# Patient Record
Sex: Female | Born: 1945
Health system: Southern US, Community
[De-identification: ages and names within clinical notes are randomized; demographics above are authoritative.]

## PROBLEM LIST (undated history)

## (undated) DIAGNOSIS — Z8719 Personal history of other diseases of the digestive system: Secondary | ICD-10-CM

## (undated) DIAGNOSIS — J45909 Unspecified asthma, uncomplicated: Secondary | ICD-10-CM

## (undated) DIAGNOSIS — R42 Dizziness and giddiness: Secondary | ICD-10-CM

## (undated) DIAGNOSIS — K649 Unspecified hemorrhoids: Secondary | ICD-10-CM

## (undated) DIAGNOSIS — G3184 Mild cognitive impairment, so stated: Secondary | ICD-10-CM

## (undated) DIAGNOSIS — I10 Essential (primary) hypertension: Secondary | ICD-10-CM

## (undated) DIAGNOSIS — H409 Unspecified glaucoma: Secondary | ICD-10-CM

## (undated) DIAGNOSIS — M1990 Unspecified osteoarthritis, unspecified site: Secondary | ICD-10-CM

## (undated) DIAGNOSIS — R1084 Generalized abdominal pain: Secondary | ICD-10-CM

## (undated) DIAGNOSIS — G500 Trigeminal neuralgia: Secondary | ICD-10-CM

## (undated) DIAGNOSIS — F0280 Dementia in other diseases classified elsewhere without behavioral disturbance: Secondary | ICD-10-CM

## (undated) DIAGNOSIS — Z972 Presence of dental prosthetic device (complete) (partial): Secondary | ICD-10-CM

## (undated) DIAGNOSIS — E782 Mixed hyperlipidemia: Secondary | ICD-10-CM

## (undated) DIAGNOSIS — N950 Postmenopausal bleeding: Secondary | ICD-10-CM

## (undated) DIAGNOSIS — K5730 Diverticulosis of large intestine without perforation or abscess without bleeding: Secondary | ICD-10-CM

## (undated) DIAGNOSIS — G459 Transient cerebral ischemic attack, unspecified: Secondary | ICD-10-CM

## (undated) DIAGNOSIS — I639 Cerebral infarction, unspecified: Secondary | ICD-10-CM

## (undated) DIAGNOSIS — R9389 Abnormal findings on diagnostic imaging of other specified body structures: Secondary | ICD-10-CM

## (undated) DIAGNOSIS — E785 Hyperlipidemia, unspecified: Secondary | ICD-10-CM

## (undated) DIAGNOSIS — R351 Nocturia: Secondary | ICD-10-CM

## (undated) DIAGNOSIS — Z7901 Long term (current) use of anticoagulants: Secondary | ICD-10-CM

## (undated) DIAGNOSIS — K5909 Other constipation: Secondary | ICD-10-CM

## (undated) HISTORY — PX: REPLACEMENT TOTAL KNEE: SUR1224

## (undated) HISTORY — DX: Unspecified asthma, uncomplicated: J45.909

## (undated) HISTORY — PX: CHOLECYSTECTOMY: SHX55

## (undated) HISTORY — DX: Hyperlipidemia, unspecified: E78.5

## (undated) HISTORY — PX: UMBILICAL HERNIA REPAIR: SHX196

## (undated) HISTORY — PX: HERNIA REPAIR: SHX51

## (undated) HISTORY — DX: Trigeminal neuralgia: G50.0

## (undated) HISTORY — PX: SHOULDER SURGERY: SHX246

## (undated) HISTORY — PX: TOTAL KNEE ARTHROPLASTY: SHX125

## (undated) HISTORY — PX: REVERSE SHOULDER ARTHROPLASTY: SHX5054

## (undated) HISTORY — PX: CATARACT EXTRACTION W/ INTRAOCULAR LENS IMPLANT: SHX1309

## (undated) HISTORY — DX: Personal history of other diseases of the digestive system: Z87.19

---

## 1998-10-11 HISTORY — PX: CHOLECYSTECTOMY: SHX55

## 1998-10-11 HISTORY — PX: CHOLECYSTECTOMY, LAPAROSCOPIC: SHX56

## 2003-12-01 ENCOUNTER — Other Ambulatory Visit: Payer: Self-pay

## 2004-03-18 ENCOUNTER — Other Ambulatory Visit: Payer: Self-pay

## 2004-08-20 ENCOUNTER — Other Ambulatory Visit: Payer: Self-pay

## 2004-08-20 ENCOUNTER — Emergency Department: Payer: Self-pay | Admitting: Emergency Medicine

## 2005-02-11 ENCOUNTER — Ambulatory Visit: Payer: Self-pay | Admitting: Family Medicine

## 2005-03-11 DIAGNOSIS — I69998 Other sequelae following unspecified cerebrovascular disease: Secondary | ICD-10-CM | POA: Insufficient documentation

## 2005-03-12 ENCOUNTER — Other Ambulatory Visit: Payer: Self-pay

## 2005-03-12 ENCOUNTER — Inpatient Hospital Stay: Payer: Self-pay | Admitting: Internal Medicine

## 2005-03-16 DIAGNOSIS — I639 Cerebral infarction, unspecified: Secondary | ICD-10-CM

## 2005-03-16 DIAGNOSIS — I6930 Unspecified sequelae of cerebral infarction: Secondary | ICD-10-CM

## 2005-03-16 HISTORY — DX: Unspecified sequelae of cerebral infarction: I69.30

## 2005-03-16 HISTORY — DX: Cerebral infarction, unspecified: I63.9

## 2005-03-19 ENCOUNTER — Other Ambulatory Visit: Payer: Self-pay

## 2005-03-19 ENCOUNTER — Emergency Department: Payer: Self-pay | Admitting: Emergency Medicine

## 2005-04-06 ENCOUNTER — Emergency Department: Payer: Self-pay | Admitting: Emergency Medicine

## 2005-04-06 ENCOUNTER — Other Ambulatory Visit: Payer: Self-pay

## 2005-05-01 ENCOUNTER — Other Ambulatory Visit: Payer: Self-pay

## 2005-05-01 ENCOUNTER — Emergency Department: Payer: Self-pay | Admitting: Emergency Medicine

## 2005-06-21 ENCOUNTER — Ambulatory Visit: Payer: Self-pay | Admitting: Neurology

## 2005-10-02 ENCOUNTER — Other Ambulatory Visit: Payer: Self-pay

## 2005-10-02 ENCOUNTER — Emergency Department: Payer: Self-pay | Admitting: Unknown Physician Specialty

## 2005-10-27 ENCOUNTER — Emergency Department: Payer: Self-pay | Admitting: Emergency Medicine

## 2005-10-27 ENCOUNTER — Other Ambulatory Visit: Payer: Self-pay

## 2005-11-16 ENCOUNTER — Ambulatory Visit: Payer: Self-pay | Admitting: Family Medicine

## 2005-11-25 ENCOUNTER — Emergency Department: Payer: Self-pay | Admitting: Emergency Medicine

## 2005-11-25 ENCOUNTER — Other Ambulatory Visit: Payer: Self-pay

## 2005-12-20 ENCOUNTER — Emergency Department: Payer: Self-pay | Admitting: General Practice

## 2006-02-05 ENCOUNTER — Emergency Department: Payer: Self-pay | Admitting: Emergency Medicine

## 2006-04-23 ENCOUNTER — Other Ambulatory Visit: Payer: Self-pay

## 2006-04-23 ENCOUNTER — Emergency Department: Payer: Self-pay | Admitting: Emergency Medicine

## 2006-04-25 ENCOUNTER — Ambulatory Visit: Payer: Self-pay | Admitting: Emergency Medicine

## 2006-06-28 ENCOUNTER — Emergency Department: Payer: Self-pay | Admitting: Emergency Medicine

## 2006-11-18 ENCOUNTER — Ambulatory Visit: Payer: Self-pay | Admitting: Family Medicine

## 2007-07-28 ENCOUNTER — Emergency Department: Payer: Self-pay | Admitting: Emergency Medicine

## 2007-07-28 ENCOUNTER — Other Ambulatory Visit: Payer: Self-pay

## 2007-12-12 ENCOUNTER — Ambulatory Visit: Payer: Self-pay | Admitting: Family Medicine

## 2008-04-28 ENCOUNTER — Emergency Department: Payer: Self-pay | Admitting: Emergency Medicine

## 2008-09-23 ENCOUNTER — Emergency Department: Payer: Self-pay | Admitting: Emergency Medicine

## 2008-12-13 ENCOUNTER — Ambulatory Visit: Payer: Self-pay | Admitting: Family Medicine

## 2009-03-07 ENCOUNTER — Emergency Department: Payer: Self-pay | Admitting: Emergency Medicine

## 2009-06-25 ENCOUNTER — Ambulatory Visit: Payer: Self-pay | Admitting: Gastroenterology

## 2009-06-25 LAB — HM COLONOSCOPY

## 2009-07-20 ENCOUNTER — Emergency Department: Payer: Self-pay | Admitting: Emergency Medicine

## 2009-08-19 ENCOUNTER — Emergency Department: Payer: Self-pay | Admitting: Emergency Medicine

## 2009-12-15 ENCOUNTER — Ambulatory Visit: Payer: Self-pay | Admitting: Family Medicine

## 2010-11-12 ENCOUNTER — Emergency Department: Payer: Self-pay | Admitting: Emergency Medicine

## 2010-12-17 ENCOUNTER — Ambulatory Visit: Payer: Self-pay | Admitting: Family Medicine

## 2011-11-08 ENCOUNTER — Emergency Department: Payer: Self-pay | Admitting: Emergency Medicine

## 2011-11-08 LAB — COMPREHENSIVE METABOLIC PANEL
Anion Gap: 9 (ref 7–16)
BUN: 13 mg/dL (ref 7–18)
Bilirubin,Total: 0.3 mg/dL (ref 0.2–1.0)
Creatinine: 0.86 mg/dL (ref 0.60–1.30)
EGFR (African American): >60
EGFR (Non-African Amer.): >60
Glucose: 86 mg/dL (ref 65–99)
Osmolality: 283 (ref 275–301)
Potassium: 3.8 mmol/L (ref 3.5–5.1)
SGOT(AST): 25 U/L (ref 15–37)
Sodium: 142 mmol/L (ref 136–145)
Total Protein: 7.2 g/dL (ref 6.4–8.2)

## 2011-11-08 LAB — CBC
MCH: 34.3 pg — ABNORMAL HIGH (ref 26.0–34.0)
MCHC: 33.5 g/dL (ref 32.0–36.0)
MCV: 103 fL — ABNORMAL HIGH (ref 80–100)
Platelet: 156 10*3/uL (ref 150–440)
RBC: 3.33 10*6/uL — ABNORMAL LOW (ref 3.80–5.20)
RDW: 12.8 % (ref 11.5–14.5)

## 2011-11-08 LAB — CK TOTAL AND CKMB (NOT AT ARMC)
CK, Total: 153 U/L (ref 21–215)
CK-MB: 0.7 ng/mL (ref 0.5–3.6)

## 2011-11-17 ENCOUNTER — Ambulatory Visit: Payer: Self-pay | Admitting: Family Medicine

## 2011-12-20 ENCOUNTER — Ambulatory Visit: Payer: Self-pay | Admitting: Family Medicine

## 2012-01-02 ENCOUNTER — Emergency Department: Payer: Self-pay | Admitting: Emergency Medicine

## 2012-10-27 ENCOUNTER — Emergency Department: Payer: Self-pay | Admitting: Internal Medicine

## 2013-01-18 ENCOUNTER — Ambulatory Visit: Payer: Self-pay | Admitting: Family Medicine

## 2013-06-25 ENCOUNTER — Emergency Department: Payer: Self-pay | Admitting: Emergency Medicine

## 2013-06-26 LAB — CBC
HCT: 36 % (ref 35.0–47.0)
HGB: 12.2 g/dL (ref 12.0–16.0)
MCHC: 33.9 g/dL (ref 32.0–36.0)
MCV: 101 fL — ABNORMAL HIGH (ref 80–100)
RBC: 3.56 10*6/uL — ABNORMAL LOW (ref 3.80–5.20)

## 2013-06-26 LAB — COMPREHENSIVE METABOLIC PANEL
Albumin: 3.6 g/dL (ref 3.4–5.0)
Alkaline Phosphatase: 136 U/L (ref 50–136)
BUN: 12 mg/dL (ref 7–18)
Chloride: 111 mmol/L — ABNORMAL HIGH (ref 98–107)
Creatinine: 0.86 mg/dL (ref 0.60–1.30)
EGFR (African American): >60
EGFR (Non-African Amer.): >60
Osmolality: 281 (ref 275–301)
SGOT(AST): 41 U/L — ABNORMAL HIGH (ref 15–37)
SGPT (ALT): 36 U/L (ref 12–78)
Sodium: 141 mmol/L (ref 136–145)

## 2013-06-26 LAB — LIPASE, BLOOD: Lipase: 100 U/L (ref 73–393)

## 2013-09-26 ENCOUNTER — Emergency Department: Payer: Self-pay | Admitting: Emergency Medicine

## 2013-09-26 LAB — CBC WITH DIFFERENTIAL/PLATELET
Basophil #: 0 10*3/uL (ref 0.0–0.1)
Eosinophil #: 0.1 10*3/uL (ref 0.0–0.7)
HCT: 36.3 % (ref 35.0–47.0)
HGB: 12.6 g/dL (ref 12.0–16.0)
Lymphocyte #: 1.8 10*3/uL (ref 1.0–3.6)
Lymphocyte %: 28.5 %
Monocyte #: 0.8 x10 3/mm (ref 0.2–0.9)
Monocyte %: 12.1 %
Neutrophil #: 3.6 10*3/uL (ref 1.4–6.5)
Neutrophil %: 57.2 %
Platelet: 181 10*3/uL (ref 150–440)
RDW: 13.4 % (ref 11.5–14.5)
WBC: 6.3 10*3/uL (ref 3.6–11.0)

## 2013-09-26 LAB — BASIC METABOLIC PANEL
Anion Gap: 5 — ABNORMAL LOW (ref 7–16)
BUN: 12 mg/dL (ref 7–18)
Chloride: 108 mmol/L — ABNORMAL HIGH (ref 98–107)
Creatinine: 0.76 mg/dL (ref 0.60–1.30)
EGFR (African American): >60
Glucose: 92 mg/dL (ref 65–99)
Osmolality: 279 (ref 275–301)

## 2013-09-26 LAB — URINALYSIS, COMPLETE
Bacteria: NONE SEEN
Bilirubin,UR: NEGATIVE
Nitrite: NEGATIVE
Ph: 6 (ref 4.5–8.0)
Protein: 30
RBC,UR: 1 /HPF (ref 0–5)
Squamous Epithelial: 3
WBC UR: 1 /HPF (ref 0–5)

## 2013-09-26 LAB — TROPONIN I: Troponin-I: <0.02 ng/mL

## 2013-10-05 ENCOUNTER — Emergency Department: Payer: Self-pay | Admitting: Emergency Medicine

## 2013-10-05 LAB — RAPID INFLUENZA A&B ANTIGENS

## 2014-01-21 ENCOUNTER — Ambulatory Visit: Payer: Self-pay | Admitting: Family Medicine

## 2015-01-23 ENCOUNTER — Ambulatory Visit
Admit: 2015-01-23 | Disposition: A | Discharge status: Home / Self Care | Payer: Self-pay | Attending: Family Medicine | Admitting: Family Medicine

## 2015-01-31 ENCOUNTER — Emergency Department: Admit: 2015-01-31 | Disposition: A | Discharge status: Home / Self Care | Payer: Self-pay | Admitting: Student

## 2015-01-31 LAB — URINALYSIS, COMPLETE
Bacteria: NONE SEEN
Bilirubin,UR: NEGATIVE
Blood: NEGATIVE
GLUCOSE, UR: NEGATIVE mg/dL (ref 0–75)
KETONE: NEGATIVE
LEUKOCYTE ESTERASE: NEGATIVE
Nitrite: NEGATIVE
PROTEIN: NEGATIVE
Ph: 5 (ref 4.5–8.0)
RBC, UR: NONE SEEN /HPF (ref 0–5)
SPECIFIC GRAVITY: 1.012 (ref 1.003–1.030)

## 2015-01-31 LAB — COMPREHENSIVE METABOLIC PANEL
ALBUMIN: 3.7 g/dL
ALK PHOS: 98 U/L
Anion Gap: 4 — ABNORMAL LOW (ref 7–16)
BILIRUBIN TOTAL: 0.3 mg/dL
BUN: 13 mg/dL
CALCIUM: 8.4 mg/dL — AB
CREATININE: 0.92 mg/dL
Chloride: 107 mmol/L
Co2: 27 mmol/L
EGFR (African American): >60
EGFR (Non-African Amer.): >60
Glucose: 98 mg/dL
Potassium: 3.7 mmol/L
SGOT(AST): 28 U/L
SGPT (ALT): 18 U/L
Sodium: 138 mmol/L
Total Protein: 7 g/dL

## 2015-01-31 LAB — CBC
HCT: 38.1 % (ref 35.0–47.0)
HGB: 12.5 g/dL (ref 12.0–16.0)
MCH: 33.5 pg (ref 26.0–34.0)
MCHC: 32.9 g/dL (ref 32.0–36.0)
MCV: 102 fL — ABNORMAL HIGH (ref 80–100)
Platelet: 161 10*3/uL (ref 150–440)
RBC: 3.73 10*6/uL — AB (ref 3.80–5.20)
RDW: 12.9 % (ref 11.5–14.5)
WBC: 6.8 10*3/uL (ref 3.6–11.0)

## 2015-01-31 LAB — APTT: Activated PTT: 26.6 secs (ref 23.6–35.9)

## 2015-01-31 LAB — CK TOTAL AND CKMB (NOT AT ARMC)
CK, Total: 103 U/L
CK-MB: 1.1 ng/mL

## 2015-01-31 LAB — TROPONIN I: Troponin-I: <0.03 ng/mL

## 2015-01-31 LAB — PROTIME-INR
INR: 1
Prothrombin Time: 13 secs

## 2015-04-21 ENCOUNTER — Other Ambulatory Visit: Payer: Self-pay | Admitting: Family Medicine

## 2015-05-10 DIAGNOSIS — B349 Viral infection, unspecified: Secondary | ICD-10-CM | POA: Diagnosis not present

## 2015-05-10 DIAGNOSIS — Z79899 Other long term (current) drug therapy: Secondary | ICD-10-CM | POA: Insufficient documentation

## 2015-05-10 DIAGNOSIS — R110 Nausea: Secondary | ICD-10-CM | POA: Diagnosis present

## 2015-05-10 DIAGNOSIS — R202 Paresthesia of skin: Secondary | ICD-10-CM | POA: Diagnosis not present

## 2015-05-10 DIAGNOSIS — I10 Essential (primary) hypertension: Secondary | ICD-10-CM | POA: Insufficient documentation

## 2015-05-11 ENCOUNTER — Other Ambulatory Visit: Payer: Self-pay

## 2015-05-11 ENCOUNTER — Emergency Department: Payer: Federal, State, Local not specified - PPO

## 2015-05-11 ENCOUNTER — Emergency Department
Admission: EM | Admit: 2015-05-11 | Discharge: 2015-05-11 | Disposition: A | Discharge status: Home / Self Care | Payer: Federal, State, Local not specified - PPO | Attending: Emergency Medicine | Admitting: Emergency Medicine

## 2015-05-11 ENCOUNTER — Encounter: Payer: Self-pay | Admitting: Emergency Medicine

## 2015-05-11 DIAGNOSIS — B349 Viral infection, unspecified: Secondary | ICD-10-CM

## 2015-05-11 HISTORY — DX: Dizziness and giddiness: R42

## 2015-05-11 HISTORY — DX: Essential (primary) hypertension: I10

## 2015-05-11 HISTORY — DX: Cerebral infarction, unspecified: I63.9

## 2015-05-11 HISTORY — DX: Transient cerebral ischemic attack, unspecified: G45.9

## 2015-05-11 LAB — CBC
HCT: 36.1 % (ref 35.0–47.0)
HEMOGLOBIN: 12.2 g/dL (ref 12.0–16.0)
MCH: 34.4 pg — ABNORMAL HIGH (ref 26.0–34.0)
MCHC: 33.6 g/dL (ref 32.0–36.0)
MCV: 102.2 fL — ABNORMAL HIGH (ref 80.0–100.0)
Platelets: 187 10*3/uL (ref 150–440)
RBC: 3.54 MIL/uL — ABNORMAL LOW (ref 3.80–5.20)
RDW: 12.8 % (ref 11.5–14.5)
WBC: 7.2 10*3/uL (ref 3.6–11.0)

## 2015-05-11 LAB — BASIC METABOLIC PANEL
ANION GAP: 9 (ref 5–15)
BUN: 20 mg/dL (ref 6–20)
CALCIUM: 8.6 mg/dL — AB (ref 8.9–10.3)
CO2: 25 mmol/L (ref 22–32)
Chloride: 105 mmol/L (ref 101–111)
Creatinine, Ser: 0.91 mg/dL (ref 0.44–1.00)
GFR calc non Af Amer: >60 mL/min (ref >60)
Glucose, Bld: 105 mg/dL — ABNORMAL HIGH (ref 65–99)
Potassium: 3.7 mmol/L (ref 3.5–5.1)
Sodium: 139 mmol/L (ref 135–145)

## 2015-05-11 LAB — TROPONIN I

## 2015-05-11 MED ORDER — ONDANSETRON 8 MG PO TBDP
8.0000 mg | ORAL_TABLET | Freq: Once | ORAL | Status: AC
  Administered 2015-05-11: 8 mg via ORAL
  Filled 2015-05-11: qty 1

## 2015-05-11 MED ORDER — RANITIDINE HCL 150 MG PO CAPS: 150.0000 mg | ORAL_CAPSULE | Freq: Two times a day (BID) | ORAL | Status: DC

## 2015-05-11 MED ORDER — FAMOTIDINE 20 MG PO TABS
40.0000 mg | ORAL_TABLET | Freq: Once | ORAL | Status: AC
  Administered 2015-05-11: 40 mg via ORAL
  Filled 2015-05-11: qty 2

## 2015-05-11 MED ORDER — ONDANSETRON 8 MG PO TBDP: 8.0000 mg | ORAL_TABLET | Freq: Three times a day (TID) | ORAL | Status: DC | PRN

## 2015-05-11 MED ORDER — GI COCKTAIL ~~LOC~~
30.0000 mL | ORAL | Status: AC
  Administered 2015-05-11: 30 mL via ORAL
  Filled 2015-05-11: qty 30

## 2015-05-11 NOTE — Discharge Instructions (Signed)

## 2015-05-11 NOTE — ED Provider Notes (Signed)
[facility] Emergency Department Provider Note  ____________________________________________  Time seen: 2:10 AM  I have reviewed the triage vital signs and the nursing notes.   HISTORY  Chief Complaint Nausea and Tingling    HPI Amy Ray is a 69 y.o. female who reports nasal and frontal sinus congestion for 2 days along with some sore throat and nonproductive coughing. Today she felt tired and upsets her stomach with nausea all day. No chest pain or shortness of breath. No fevers chills dizziness lightheadedness or syncope. No dysuria frequency urgency back pain. She is currently feeling better.  She does report some tingling of the left side of her body but states this is a chronic issue that is not changed and she is not concerned about     Past Medical History  Diagnosis Date  . Hypertension   . Arthritis   . Stroke   . TIA (transient ischemic attack)   . Vertigo     There are no active problems to display for this patient.   Past Surgical History  Procedure Laterality Date  . Replacement total knee    . Hernia repair    . Cholecystectomy      Current Outpatient Rx  Name  Route  Sig  Dispense  Refill  . nortriptyline (PAMELOR) 10 MG capsule      1-2 Capsule, Oral, at bedtime   60 capsule   12     DX: 350.1   . ondansetron (ZOFRAN ODT) 8 MG disintegrating tablet   Oral   Take 1 tablet (8 mg total) by mouth every 8 (eight) hours as needed for nausea or vomiting.   20 tablet   0   . ranitidine (ZANTAC) 150 MG capsule   Oral   Take 1 capsule (150 mg total) by mouth 2 (two) times daily.   28 capsule   0     Allergies Review of patient's allergies indicates no known allergies.  History reviewed. No pertinent family history.  Social History History  Substance Use Topics  . Smoking status: Never Smoker   . Smokeless tobacco: Not on file  . Alcohol Use: No    Review of Systems  Constitutional: No fever or chills.  No weight changes Eyes:No blurry vision or double vision.  ENT: Positive sore throat. Cardiovascular: No chest pain. Respiratory: No dyspnea, positive nonproductive cough. Gastrointestinal: Negative for abdominal pain, vomiting and diarrhea.  No BRBPR or melena. Genitourinary: Negative for dysuria, urinary retention, bloody urine, or difficulty urinating. Musculoskeletal: Negative for back pain. No joint swelling or pain. Skin: Negative for rash. Neurological: Negative for headaches, focal weakness or numbness. Psychiatric:No anxiety or depression.   Endocrine:No hot/cold intolerance, changes in energy, or sleep difficulty.  10-point ROS otherwise negative.  ____________________________________________   PHYSICAL EXAM:  VITAL SIGNS: ED Triage Vitals  Enc Vitals Group     BP 05/11/15 0024 153/75 mmHg     Pulse Rate 05/11/15 0024 88     Resp 05/11/15 0024 20     Temp 05/11/15 0024 98.2 F (36.8 C)     Temp Source 05/11/15 0024 Oral     SpO2 05/11/15 0024 99 %     Weight 05/11/15 0024 205 lb (92.987 kg)     Height 05/11/15 0024  (1.549 m)     Head Cir --      Peak Flow --      Pain Score --      Pain Loc --  Pain Edu? --      Excl. in GC? --      Constitutional: Alert and oriented. Well appearing and in no distress. Eyes: No scleral icterus. No conjunctival pallor. PERRL. EOMI ENT   Head: Normocephalic and atraumatic. No pain on percussion of sinuses   Nose: No congestion/rhinnorhea. No septal hematoma   Mouth/Throat: MMM, mild pharyngeal erythema. No peritonsillar mass. No uvula shift.   Neck: No stridor. No SubQ emphysema. No meningismus. Hematological/Lymphatic/Immunilogical: No cervical lymphadenopathy. Cardiovascular: RRR. Normal and symmetric distal pulses are present in all extremities. No murmurs, rubs, or gallops. Respiratory: Normal respiratory effort without tachypnea nor retractions. Breath sounds are clear and equal bilaterally. No  wheezes/rales/rhonchi. Gastrointestinal: Soft and nontender. No distention. There is no CVA tenderness.  No rebound, rigidity, or guarding. Genitourinary: deferred Musculoskeletal: Nontender with normal range of motion in all extremities. No joint effusions.  No lower extremity tenderness.  No edema. Neurologic:   Normal speech and language.  CN 2-10 normal. Motor grossly intact. No pronator drift.  Normal gait. No gross focal neurologic deficits are appreciated.  Skin:  Skin is warm, dry and intact. No rash noted.  No petechiae, purpura, or bullae. Psychiatric: Mood and affect are normal. Speech and behavior are normal. Patient exhibits appropriate insight and judgment.  ____________________________________________    LABS (pertinent positives/negatives) (all labs ordered are listed, but only abnormal results are displayed) Labs Reviewed  BASIC METABOLIC PANEL - Abnormal; Notable for the following:    Glucose, Bld 105 (*)    Calcium 8.6 (*)    All other components within normal limits  CBC - Abnormal; Notable for the following:    RBC 3.54 (*)    MCV 102.2 (*)    MCH 34.4 (*)    All other components within normal limits  TROPONIN I   ____________________________________________   EKG  Interpreted by me  Date: 05/11/2015  Rate: 88  Rhythm: normal sinus rhythm  QRS Axis: normal  Intervals: normal  ST/T Wave abnormalities: normal  Conduction Disutrbances: none  Narrative Interpretation: unremarkable      ____________________________________________    RADIOLOGY  Chest x-ray unremarkable CT head unremarkable  ____________________________________________   PROCEDURES  ____________________________________________   INITIAL IMPRESSION / ASSESSMENT AND PLAN / ED COURSE  Pertinent labs & imaging results that were available during my care of the patient were reviewed by me and considered in my medical decision making (see chart for details).  Patient  presents with acute symptoms suggestive of a viral syndrome. The tingling appears to be chronic issue and is not indicative of any acute neurologic process such as intracranial hemorrhage or stroke. She is very well appearing in good spirits good energy. Calm comfortable. We'll manage her symptoms with antacids and antiemetics and have her follow-up with primary care this week.  ____________________________________________   FINAL CLINICAL IMPRESSION(S) / ED DIAGNOSES  Final diagnoses:  Viral syndrome      Sharman Cheek, MD 05/11/15 [phone]

## 2015-05-11 NOTE — ED Notes (Signed)
Pt presents to ER alert and in NAD. Pt states she is having nausea, left side tingling, belching. Pt moving all extremities normally, clear speech.

## 2015-05-20 ENCOUNTER — Ambulatory Visit (INDEPENDENT_AMBULATORY_CARE_PROVIDER_SITE_OTHER): Payer: Federal, State, Local not specified - PPO | Admitting: Family Medicine

## 2015-05-20 ENCOUNTER — Encounter: Payer: Self-pay | Admitting: Family Medicine

## 2015-05-20 ENCOUNTER — Encounter: Payer: Self-pay | Admitting: *Deleted

## 2015-05-20 VITALS — BP 120/84 | HR 83 | Temp 98.6°F | Resp 16 | Ht 61.0 in | Wt 215.0 lb

## 2015-05-20 DIAGNOSIS — R5383 Other fatigue: Secondary | ICD-10-CM | POA: Insufficient documentation

## 2015-05-20 DIAGNOSIS — K5790 Diverticulosis of intestine, part unspecified, without perforation or abscess without bleeding: Secondary | ICD-10-CM | POA: Insufficient documentation

## 2015-05-20 DIAGNOSIS — G500 Trigeminal neuralgia: Secondary | ICD-10-CM

## 2015-05-20 DIAGNOSIS — Z8719 Personal history of other diseases of the digestive system: Secondary | ICD-10-CM | POA: Insufficient documentation

## 2015-05-20 DIAGNOSIS — G4700 Insomnia, unspecified: Secondary | ICD-10-CM | POA: Insufficient documentation

## 2015-05-20 DIAGNOSIS — M545 Low back pain, unspecified: Secondary | ICD-10-CM | POA: Insufficient documentation

## 2015-05-20 DIAGNOSIS — Z8673 Personal history of transient ischemic attack (TIA), and cerebral infarction without residual deficits: Secondary | ICD-10-CM | POA: Diagnosis not present

## 2015-05-20 DIAGNOSIS — H6980 Other specified disorders of Eustachian tube, unspecified ear: Secondary | ICD-10-CM | POA: Insufficient documentation

## 2015-05-20 DIAGNOSIS — I10 Essential (primary) hypertension: Secondary | ICD-10-CM

## 2015-05-20 DIAGNOSIS — R1013 Epigastric pain: Secondary | ICD-10-CM | POA: Insufficient documentation

## 2015-05-20 DIAGNOSIS — R251 Tremor, unspecified: Secondary | ICD-10-CM | POA: Insufficient documentation

## 2015-05-20 DIAGNOSIS — E785 Hyperlipidemia, unspecified: Secondary | ICD-10-CM | POA: Diagnosis not present

## 2015-05-20 DIAGNOSIS — A048 Other specified bacterial intestinal infections: Secondary | ICD-10-CM | POA: Insufficient documentation

## 2015-05-20 DIAGNOSIS — R51 Headache: Secondary | ICD-10-CM

## 2015-05-20 DIAGNOSIS — R519 Headache, unspecified: Secondary | ICD-10-CM | POA: Insufficient documentation

## 2015-05-20 HISTORY — DX: Personal history of other diseases of the digestive system: Z87.19

## 2015-05-20 NOTE — Progress Notes (Signed)
Patient: Amy Ray Female    DOB: 1946-07-29   69 y.o.   MRN: [id number] Visit Date: 05/20/2015  Today's Provider: Mila Merry, MD   Chief Complaint  Patient presents with  . Follow-up    6 months  . Hypertension  . Hyperlipidemia  . Insomnia   Subjective:    HPI    Follow-up for insomnia from 12/05/2013; no changes were made.  She went to ER on 05-11-15 when she developed dizziness and tingling in the left side of her body. She had extensive negative workup including negative head CT. She has had similar symptoms off and on since at least 2006 when she had thorough neuro-up by Dr. Chestine Spore and sx thought to be TIA, RIND, or anxiety. She remains on statin, aggressive BP management, and Aggrenox which she is tolerating well.    Hypertension, follow-up:  BP Readings from Last 3 Encounters:  05/20/15 120/84  12/05/14 140/90  05/11/15 156/72    She was last seen for hypertension 6 months ago.  BP at that visit was none. Management changes since that visit include none .She reports good compliance with treatment. She is not having side effects. none  She is exercising. She is adherent to low salt diet.   Outside blood pressures are 120/80. She is experiencing palpitations.  Patient denies none.   Cardiovascular risk factors include none.  Use of agents associated with hypertension: none.   ------------------------------------------------------------------------    Lipid/Cholesterol, Follow-up:   Last seen for this 6 months ago.  Management changes since that visit include none.    She reports good compliance with treatment. She is not having side effects. none  Wt Readings from Last 3 Encounters:  05/20/15 215 lb (97.523 kg)  12/05/14 221 lb (100.245 kg)  05/11/15 205 lb (92.987 kg)    ------------------------------------------------------------------------  Trigeminal neuralgia She continues on carbamazepine and nortriptylline which she reports are  well tolerated and working well. Episodes occur only a few times a week and are mild.    Allergies  Allergen Reactions  . Quinapril-Hydrochlorothiazide Other (See Comments)    Other reaction(s): Headache   Previous Medications   ALPRAZOLAM (XANAX) 0.5 MG TABLET    Take 0.5-1 tablets by mouth. Up to 2 times daily as needed for sleep   ASPIRIN 81 MG TABLET    Take 1 tablet by mouth daily.   CARBAMAZEPINE (TEGRETOL) 200 MG TABLET    Take 1 tablet by mouth 2 (two) times daily.   DILTIAZEM (DILACOR XR) 180 MG 24 HR CAPSULE    Take 2 capsules by mouth daily.   DIPYRIDAMOLE-ASPIRIN (AGGRENOX) 200-25 MG PER 12 HR CAPSULE    Take 1 capsule by mouth every 12 (twelve) hours.   FLUTICASONE (FLONASE) 50 MCG/ACT NASAL SPRAY    Place 2 sprays into both nostrils at bedtime.   IRON, IRON,    Take 1 tablet by mouth daily.   NORTRIPTYLINE (PAMELOR) 10 MG CAPSULE    1-2 Capsule, Oral, at bedtime   ONDANSETRON (ZOFRAN ODT) 8 MG DISINTEGRATING TABLET    Take 1 tablet (8 mg total) by mouth every 8 (eight) hours as needed for nausea or vomiting.   RANITIDINE (ZANTAC) 150 MG CAPSULE    Take 1 capsule (150 mg total) by mouth 2 (two) times daily.   SIMVASTATIN (ZOCOR) 20 MG TABLET    Take 1 tablet by mouth daily.    Review of Systems  Constitutional: Negative for chills and fatigue.  Respiratory:  Negative for chest tightness, shortness of breath and wheezing.   Cardiovascular: Positive for palpitations. Negative for chest pain and leg swelling.  Neurological: Positive for dizziness, light-headedness and headaches.    History  Substance Use Topics  . Smoking status: Never Smoker   . Smokeless tobacco: Not on file  . Alcohol Use: No   Objective:   BP 120/84 mmHg  Pulse 83  Temp(Src) 98.6 F (37 C) (Oral)  Resp 16  Ht 5\' 1"  (1.549 m)  Wt 215 lb (97.523 kg)  BMI 40.64 kg/m2  SpO2 99%  Physical Exam  General Appearance:    Alert, cooperative, no distress, obese  Eyes:    PERRL, conjunctiva/corneas  clear, EOM's intact       Lungs:     Clear to auscultation bilaterally, respirations unlabored  Heart:    Regular rate and rhythm  Neurologic:   Awake, alert, oriented x 3. No apparent focal neurological           defect.   Carotids:   No bruit      Results for orders placed or performed during the hospital encounter of 05/11/15  Basic metabolic panel  Result Value Ref Range   Sodium 139 135 - 145 mmol/L   Potassium 3.7 3.5 - 5.1 mmol/L   Chloride 105 101 - 111 mmol/L   CO2 25 22 - 32 mmol/L   Glucose, Bld 105 (H) 65 - 99 mg/dL   BUN 20 6 - 20 mg/dL   Creatinine, Ser 1.61 0.44 - 1.00 mg/dL   Calcium 8.6 (L) 8.9 - 10.3 mg/dL   GFR calc non Af Amer >60 >60 mL/min   GFR calc Af Amer >60 >60 mL/min   Anion gap 9 5 - 15  CBC  Result Value Ref Range   WBC 7.2 3.6 - 11.0 K/uL   RBC 3.54 (L) 3.80 - 5.20 MIL/uL   Hemoglobin 12.2 12.0 - 16.0 g/dL   HCT 09.6 04.5 - 40.9 %   MCV 102.2 (H) 80.0 - 100.0 fL   MCH 34.4 (H) 26.0 - 34.0 pg   MCHC 33.6 32.0 - 36.0 g/dL   RDW 81.1 91.4 - 78.2 %   Platelets 187 150 - 440 K/uL  Troponin I  Result Value Ref Range   Troponin I <0.03 <0.031 ng/mL       Assessment & Plan:     1. Trigeminal neuralgia well controlled Continue current medications.    2. Essential (primary) hypertension well controlled Recent EKG and labs at [facility] were normal.   3. Hyperlipidemia She is tolerating simvastatin well with no adverse effects.    4. History of CVA (cerebrovascular accident) Continue Aggrenox and aggressive risk factor modfication.   Return in about 6 months (around 11/20/2015).      Mila Merry, MD  [facility]

## 2015-06-05 ENCOUNTER — Ambulatory Visit: Payer: Self-pay | Admitting: Family Medicine

## 2015-06-24 ENCOUNTER — Other Ambulatory Visit: Payer: Self-pay | Admitting: Family Medicine

## 2015-06-24 NOTE — Telephone Encounter (Signed)
I think your pt.

## 2015-06-24 NOTE — Telephone Encounter (Signed)
Please call in alprazolam.  

## 2015-06-25 ENCOUNTER — Other Ambulatory Visit: Payer: Self-pay | Admitting: *Deleted

## 2015-06-25 MED ORDER — SIMVASTATIN 20 MG PO TABS: 20.0000 mg | ORAL_TABLET | Freq: Every day | ORAL | Status: DC

## 2015-06-25 MED ORDER — CARBAMAZEPINE 200 MG PO TABS: 200.0000 mg | ORAL_TABLET | Freq: Two times a day (BID) | ORAL | Status: DC

## 2015-06-25 NOTE — Telephone Encounter (Addendum)
Refill request for Simvastatin 20 mg qd Last filled by MD on- 07/02/2014 #90 x3  Refill request for Carbamazepine 200 mg x1 qd Last filled by MD on- 07/02/2014 #180 x3  Last Appt: 05/20/2015 Next Appt: none Please advise refill?

## 2015-06-25 NOTE — Telephone Encounter (Signed)
Rx called in to pharmacy. 

## 2015-07-01 ENCOUNTER — Encounter: Payer: Self-pay | Admitting: Family Medicine

## 2015-07-01 ENCOUNTER — Ambulatory Visit (INDEPENDENT_AMBULATORY_CARE_PROVIDER_SITE_OTHER): Payer: Federal, State, Local not specified - PPO | Admitting: Family Medicine

## 2015-07-01 VITALS — BP 130/80 | HR 84 | Temp 98.4°F | Resp 16 | Wt 214.0 lb

## 2015-07-01 DIAGNOSIS — R194 Change in bowel habit: Secondary | ICD-10-CM

## 2015-07-01 NOTE — Progress Notes (Signed)
Patient: Amy Ray Female    DOB: June 03, 1946   69 y.o.   MRN: [id number] Visit Date: 07/01/2015  Today's Provider: Mila Merry, MD   No chief complaint on file.  Subjective:    HPI  Increased bowel movements since Saturday 06/28/2015, up to 6-7 times a day. Usually as soon as she eats she has to go straight to the restroom.States that she usually stays a little constipated and has to takes OTC Maalox and suppository. Has felt a little more fatigued lately. Stool is pasty, not watery at all. No blood in stool. No pain or cramping, but a little bit nauseated. No change in medications, diet or eating habits.   Allergies  Allergen Reactions  . Quinapril-Hydrochlorothiazide Other (See Comments)    Other reaction(s): Headache   Previous Medications   ALPRAZOLAM (XANAX) 0.5 MG TABLET    take 1/2 to 1 tablet by mouth UP TO 2 TIMES A DAY AS NEEDED FOR SLEEP.   ASPIRIN 81 MG TABLET    Take 1 tablet by mouth daily.   CARBAMAZEPINE (TEGRETOL) 200 MG TABLET    Take 1 tablet (200 mg total) by mouth 2 (two) times daily.   DILTIAZEM (DILACOR XR) 180 MG 24 HR CAPSULE    Take 2 capsules by mouth daily.   DIPYRIDAMOLE-ASPIRIN (AGGRENOX) 200-25 MG PER 12 HR CAPSULE    Take 1 capsule by mouth every 12 (twelve) hours.   FLUTICASONE (FLONASE) 50 MCG/ACT NASAL SPRAY    Place 2 sprays into both nostrils at bedtime.   IRON, IRON,    Take 1 tablet by mouth daily.   NORTRIPTYLINE (PAMELOR) 10 MG CAPSULE    1-2 Capsule, Oral, at bedtime   ONDANSETRON (ZOFRAN ODT) 8 MG DISINTEGRATING TABLET    Take 1 tablet (8 mg total) by mouth every 8 (eight) hours as needed for nausea or vomiting.   RANITIDINE (ZANTAC) 150 MG CAPSULE    Take 1 capsule (150 mg total) by mouth 2 (two) times daily.   SIMVASTATIN (ZOCOR) 20 MG TABLET    Take 1 tablet (20 mg total) by mouth daily.    Review of Systems  Cardiovascular: Negative for chest pain and palpitations.  Gastrointestinal: Positive for nausea and abdominal  distention. Negative for blood in stool and anal bleeding.       Increase in bowel movements/no diarrhea   Neurological: Negative for dizziness, light-headedness and headaches.    Social History  Substance Use Topics  . Smoking status: Never Smoker   . Smokeless tobacco: Not on file  . Alcohol Use: No   Objective:   BP 130/80 mmHg  Pulse 84  Temp(Src) 98.4 F (36.9 C) (Oral)  Resp 16  Wt 214 lb (97.07 kg)  SpO2 100%  Physical Exam  General Appearance:    Alert, cooperative, no distress, obese  Eyes:    PERRL, conjunctiva/corneas clear, EOM's intact       Lungs:     Clear to auscultation bilaterally, respirations unlabored  Heart:    Regular rate and rhythm  Abdomen:   bowel sounds present and normal in all 4 quadrants. No CVA or abdominal  tenderness        Assessment & Plan:     1. Change in bowel habits Send for copy of most recent colonoscopy, She was referred to Dr. Niel Hummer in 2010 but do not have a record of this test result.   - CBC - Comprehensive metabolic panel - T4 AND TSH  She may take 1 or 2 OTC Imodium.       Mila Merry, MD  [facility]

## 2015-07-01 NOTE — Patient Instructions (Signed)
You may take 1 or 2 Imodium today to reduce number of bowel movements.

## 2015-07-02 ENCOUNTER — Telehealth: Payer: Self-pay | Admitting: Family Medicine

## 2015-07-02 LAB — COMPREHENSIVE METABOLIC PANEL
ALK PHOS: 124 IU/L — AB (ref 39–117)
ALT: 23 IU/L (ref 0–32)
AST: 24 IU/L (ref 0–40)
Albumin/Globulin Ratio: 1.4 (ref 1.1–2.5)
Albumin: 4.2 g/dL (ref 3.6–4.8)
BILIRUBIN TOTAL: 0.3 mg/dL (ref 0.0–1.2)
BUN/Creatinine Ratio: 12 (ref 11–26)
BUN: 11 mg/dL (ref 8–27)
CO2: 24 mmol/L (ref 18–29)
CREATININE: 0.93 mg/dL (ref 0.57–1.00)
Calcium: 9 mg/dL (ref 8.7–10.3)
Chloride: 104 mmol/L (ref 97–108)
GFR calc Af Amer: 73 mL/min/{1.73_m2} (ref >59)
GFR calc non Af Amer: 63 mL/min/{1.73_m2} (ref >59)
GLOBULIN, TOTAL: 3.1 g/dL (ref 1.5–4.5)
Glucose: 83 mg/dL (ref 65–99)
Potassium: 4.3 mmol/L (ref 3.5–5.2)
SODIUM: 144 mmol/L (ref 134–144)
Total Protein: 7.3 g/dL (ref 6.0–8.5)

## 2015-07-02 LAB — CBC
Hematocrit: 39.7 % (ref 34.0–46.6)
Hemoglobin: 13.5 g/dL (ref 11.1–15.9)
MCH: 34.1 pg — AB (ref 26.6–33.0)
MCHC: 34 g/dL (ref 31.5–35.7)
MCV: 100 fL — ABNORMAL HIGH (ref 79–97)
Platelets: 211 10*3/uL (ref 150–379)
RBC: 3.96 x10E6/uL (ref 3.77–5.28)
RDW: 13.1 % (ref 12.3–15.4)
WBC: 6.3 10*3/uL (ref 3.4–10.8)

## 2015-07-02 LAB — T4 AND TSH
T4, Total: 5.7 ug/dL (ref 4.5–12.0)
TSH: 1.76 u[IU]/mL (ref 0.450–4.500)

## 2015-07-02 NOTE — Telephone Encounter (Signed)
Pt is requesting lab results.  CB#[phone]/[id number]

## 2015-07-02 NOTE — Telephone Encounter (Signed)
Please advise results? 

## 2015-07-03 ENCOUNTER — Telehealth: Payer: Self-pay | Admitting: Family Medicine

## 2015-07-03 DIAGNOSIS — R197 Diarrhea, unspecified: Secondary | ICD-10-CM

## 2015-07-03 NOTE — Telephone Encounter (Signed)
Patient notified of results. Expressed understanding. Patient stated she has started having diarrhea and some stomach cramps.

## 2015-07-03 NOTE — Telephone Encounter (Signed)
Stool labs ordered. Patient was notified.

## 2015-07-03 NOTE — Telephone Encounter (Signed)
Need to order stool tests Need stool WBC, c. Diff toxins, ova&parasites, and stool cultures.  Once she collects stool sample we can start her on antibiotic for most common bacterial infections of bowel.

## 2015-07-03 NOTE — Telephone Encounter (Signed)
Pt stated that she needs to speak with a nurse today. She is still having diarrhea. Thanks TNP

## 2015-07-07 ENCOUNTER — Other Ambulatory Visit: Payer: Self-pay | Admitting: Family Medicine

## 2015-07-11 ENCOUNTER — Telehealth: Payer: Self-pay | Admitting: Family Medicine

## 2015-07-11 LAB — STOOL CULTURE: E coli, Shiga toxin Assay: NEGATIVE

## 2015-07-11 LAB — FECAL LACTOFERRIN, QUANT: Lactoferrin, Fecal, Quant.: 1.4 ug/mL(g) (ref 0.00–7.24)

## 2015-07-11 LAB — CLOSTRIDIUM DIFFICILE EIA: C difficile Toxins A+B, EIA: NEGATIVE

## 2015-07-11 LAB — OVA AND PARASITE EXAMINATION

## 2015-07-11 NOTE — Telephone Encounter (Signed)
Please review. Thanks!  

## 2015-07-11 NOTE — Telephone Encounter (Signed)
Pt stated that she hasn't gotten the results from her stool sample that was sent to [facility] on 07/07/15. Thanks TNP

## 2015-07-11 NOTE — Telephone Encounter (Signed)
[employer] and the Research scientist (medical) says that the sample is still being processed. They are waiting on the stool culture result which usually take 4 days. Tried calling patient to give her an update. No answer. Left message for patient to call back.

## 2015-07-11 NOTE — Telephone Encounter (Signed)
Advised patient as below.  

## 2015-07-21 ENCOUNTER — Emergency Department
Admission: EM | Admit: 2015-07-21 | Discharge: 2015-07-22 | Disposition: A | Discharge status: Home / Self Care | Payer: Federal, State, Local not specified - PPO | Attending: Student | Admitting: Student

## 2015-07-21 DIAGNOSIS — I10 Essential (primary) hypertension: Secondary | ICD-10-CM | POA: Diagnosis not present

## 2015-07-21 DIAGNOSIS — Z79899 Other long term (current) drug therapy: Secondary | ICD-10-CM | POA: Insufficient documentation

## 2015-07-21 DIAGNOSIS — R197 Diarrhea, unspecified: Secondary | ICD-10-CM | POA: Diagnosis not present

## 2015-07-21 DIAGNOSIS — N390 Urinary tract infection, site not specified: Secondary | ICD-10-CM | POA: Diagnosis not present

## 2015-07-21 DIAGNOSIS — Z7982 Long term (current) use of aspirin: Secondary | ICD-10-CM | POA: Diagnosis not present

## 2015-07-21 DIAGNOSIS — R1032 Left lower quadrant pain: Secondary | ICD-10-CM

## 2015-07-21 DIAGNOSIS — R195 Other fecal abnormalities: Secondary | ICD-10-CM

## 2015-07-21 LAB — CBC WITH DIFFERENTIAL/PLATELET
Basophils Absolute: 0 10*3/uL (ref 0–0.1)
Basophils Relative: 0 %
EOS ABS: 0.1 10*3/uL (ref 0–0.7)
EOS PCT: 2 %
HCT: 35 % (ref 35.0–47.0)
Hemoglobin: 11.8 g/dL — ABNORMAL LOW (ref 12.0–16.0)
Lymphocytes Relative: 28 %
Lymphs Abs: 2.2 10*3/uL (ref 1.0–3.6)
MCH: 33.9 pg (ref 26.0–34.0)
MCHC: 33.6 g/dL (ref 32.0–36.0)
MCV: 100.8 fL — ABNORMAL HIGH (ref 80.0–100.0)
Monocytes Absolute: 1 10*3/uL — ABNORMAL HIGH (ref 0.2–0.9)
Monocytes Relative: 13 %
NEUTROS PCT: 57 %
Neutro Abs: 4.4 10*3/uL (ref 1.4–6.5)
PLATELETS: 176 10*3/uL (ref 150–440)
RBC: 3.47 MIL/uL — ABNORMAL LOW (ref 3.80–5.20)
RDW: 12.3 % (ref 11.5–14.5)
WBC: 7.7 10*3/uL (ref 3.6–11.0)

## 2015-07-21 LAB — COMPREHENSIVE METABOLIC PANEL
ALBUMIN: 3.8 g/dL (ref 3.5–5.0)
ALT: 21 U/L (ref 14–54)
AST: 25 U/L (ref 15–41)
Alkaline Phosphatase: 110 U/L (ref 38–126)
Anion gap: 6 (ref 5–15)
BUN: 29 mg/dL — ABNORMAL HIGH (ref 6–20)
CALCIUM: 8.7 mg/dL — AB (ref 8.9–10.3)
CHLORIDE: 108 mmol/L (ref 101–111)
CO2: 28 mmol/L (ref 22–32)
Creatinine, Ser: 1.2 mg/dL — ABNORMAL HIGH (ref 0.44–1.00)
GFR calc non Af Amer: 45 mL/min — ABNORMAL LOW (ref >60)
GFR, EST AFRICAN AMERICAN: 53 mL/min — AB (ref >60)
GLUCOSE: 97 mg/dL (ref 65–99)
Potassium: 3.7 mmol/L (ref 3.5–5.1)
SODIUM: 142 mmol/L (ref 135–145)
Total Bilirubin: 0.5 mg/dL (ref 0.3–1.2)
Total Protein: 7.3 g/dL (ref 6.5–8.1)

## 2015-07-21 LAB — LIPASE, BLOOD: Lipase: 32 U/L (ref 22–51)

## 2015-07-21 MED ORDER — IOHEXOL 240 MG/ML SOLN
25.0000 mL | Freq: Once | INTRAMUSCULAR | Status: AC | PRN
  Administered 2015-07-21: 25 mL via ORAL

## 2015-07-21 MED ORDER — SODIUM CHLORIDE 0.9 % IV BOLUS (SEPSIS)
500.0000 mL | Freq: Once | INTRAVENOUS | Status: AC
  Administered 2015-07-22: 500 mL via INTRAVENOUS

## 2015-07-21 NOTE — ED Provider Notes (Addendum)
[facility] Emergency Department Provider Note  ____________________________________________  Time seen: Approximately 10:57 PM  I have reviewed the triage vital signs and the nursing notes.   HISTORY  Chief Complaint Abdominal Pain    HPI Amy Ray is a 69 y.o. female with history of GERD, CVA, diverticulitis, hyperlipidemia who presents for evaluation of 3 weeks gradual onset constant nonbloody loose stools as well as gradual onset aching left lower quadrant pain which began today. No nausea, vomiting, fevers or chills. She hasn't had 2-3 bouts of diarrhea over the past 3 weeks but for the most part her stools are just loose. No chest pain, no difficulty breathing. She has had intermittent dysuria. Currently her pain is mild. It is worse with movement.   Past Medical History  Diagnosis Date  . Stroke (HCC)   . TIA (transient ischemic attack)   . Vertigo   . History of diverticulitis of colon 05/20/2015    per patient report   . Hypertension     Patient Active Problem List   Diagnosis Date Noted  . Low back pain 05/20/2015  . Fatigue 05/20/2015  . History of CVA (cerebrovascular accident) 05/20/2015  . Headache 05/20/2015  . Insomnia 05/20/2015  . Obesity 05/20/2015  . Positive H. pylori test 05/20/2015  . Tremor 05/20/2015  . Dysfunction of eustachian tube 05/20/2015  . Diverticulosis 05/20/2015  . Essential (primary) hypertension 07/17/2007  . Alterations of sensations, late effect of cerebrovascular disease 03/11/2005  . Hyperlipidemia 10/12/2003  . Trigeminal neuralgia 10/12/2003    Past Surgical History  Procedure Laterality Date  . Replacement total knee Bilateral   . Cholecystectomy  2000  . Hernia repair      umbilical hernia as a child    Current Outpatient Rx  Name  Route  Sig  Dispense  Refill  . ALPRAZolam (XANAX) 0.5 MG tablet      take 1/2 to 1 tablet by mouth UP TO 2 TIMES A DAY AS NEEDED FOR SLEEP.   60 tablet    3   . aspirin 81 MG tablet   Oral   Take 1 tablet by mouth daily.         . carbamazepine (TEGRETOL) 200 MG tablet   Oral   Take 1 tablet (200 mg total) by mouth 2 (two) times daily.   180 tablet   1   . diltiazem (DILACOR XR) 180 MG 24 hr capsule   Oral   Take 2 capsules by mouth daily.         Marland Kitchen dipyridamole-aspirin (AGGRENOX) 200-25 MG per 12 hr capsule   Oral   Take 1 capsule by mouth every 12 (twelve) hours.         . fluticasone (FLONASE) 50 MCG/ACT nasal spray   Each Nare   Place 2 sprays into both nostrils at bedtime.         . IRON, IRON,   Oral   Take 1 tablet by mouth daily.         . nortriptyline (PAMELOR) 10 MG capsule      1-2 Capsule, Oral, at bedtime   60 capsule   12     DX: 350.1   . ondansetron (ZOFRAN ODT) 8 MG disintegrating tablet   Oral   Take 1 tablet (8 mg total) by mouth every 8 (eight) hours as needed for nausea or vomiting.   20 tablet   0   . ranitidine (ZANTAC) 150 MG capsule   Oral  Take 1 capsule (150 mg total) by mouth 2 (two) times daily.   28 capsule   0   . simvastatin (ZOCOR) 20 MG tablet   Oral   Take 1 tablet (20 mg total) by mouth daily.   90 tablet   1     Allergies Quinapril-hydrochlorothiazide  Family History  Problem Relation Age of Onset  . Heart attack Mother   . Hypertension Mother   . Alzheimer's disease Father   . Diabetes Father   . Hypertension Sister   . Diabetes Brother   . Hypertension Sister   . Hypertension Sister   . Hypertension Sister   . Hypertension Sister   . Breast cancer Sister     Social History Social History  Substance Use Topics  . Smoking status: Never Smoker   . Smokeless tobacco: None  . Alcohol Use: No    Review of Systems Constitutional: No fever/chills Eyes: No visual changes. ENT: No sore throat. Cardiovascular: Denies chest pain. Respiratory: Denies shortness of breath. Gastrointestinal: + abdominal pain.  No nausea, no vomiting.  + diarrhea.   No constipation. Genitourinary: Positive for dysuria. Musculoskeletal: Negative for back pain. Skin: Negative for rash. Neurological: Negative for headaches, focal weakness or numbness.  10-point ROS otherwise negative.  ____________________________________________   PHYSICAL EXAM:  VITAL SIGNS: ED Triage Vitals  Enc Vitals Group     BP 07/21/15 2236 139/72 mmHg     Pulse Rate 07/21/15 2236 90     Resp 07/21/15 2236 20     Temp 07/21/15 2236 98.1 F (36.7 C)     Temp Source 07/21/15 2236 Oral     SpO2 07/21/15 2236 100 %     Weight 07/21/15 2236 208 lb (94.348 kg)     Height 07/21/15 2236 5' (1.524 m)     Head Cir --      Peak Flow --      Pain Score 07/21/15 2235 5     Pain Loc --      Pain Edu? --      Excl. in GC? --     Constitutional: Alert and oriented. Well appearing and in no acute distress. Eyes: Conjunctivae are normal. PERRL. EOMI. Head: Atraumatic. Nose: No congestion/rhinnorhea. Mouth/Throat: Mucous membranes are moist.  Oropharynx non-erythematous. Neck: No stridor.   Cardiovascular: Normal rate, regular rhythm. Grossly normal heart sounds.  Good peripheral circulation. Respiratory: Normal respiratory effort.  No retractions. Lungs CTAB. Gastrointestinal: Soft with very faint tenderness to palpation in the left lower quadrant. No distention. No CVA tenderness. Genitourinary: Deferred Musculoskeletal: No lower extremity tenderness nor edema.  No joint effusions. Neurologic:  Normal speech and language. No gross focal neurologic deficits are appreciated.  Skin:  Skin is warm, dry and intact. No rash noted. Psychiatric: Mood and affect are normal. Speech and behavior are normal.  ____________________________________________   LABS (all labs ordered are listed, but only abnormal results are displayed)  Labs Reviewed  CBC WITH DIFFERENTIAL/PLATELET - Abnormal; Notable for the following:    RBC 3.47 (*)    Hemoglobin 11.8 (*)    MCV 100.8 (*)     Monocytes Absolute 1.0 (*)    All other components within normal limits  COMPREHENSIVE METABOLIC PANEL - Abnormal; Notable for the following:    BUN 29 (*)    Creatinine, Ser 1.20 (*)    Calcium 8.7 (*)    GFR calc non Af Amer 45 (*)    GFR calc Af Amer 53 (*)  All other components within normal limits  URINALYSIS COMPLETEWITH MICROSCOPIC ([facility] ONLY) - Abnormal; Notable for the following:    Color, Urine YELLOW (*)    APPearance HAZY (*)    Nitrite POSITIVE (*)    Leukocytes, UA 1+ (*)    Bacteria, UA MANY (*)    Squamous Epithelial / LPF 0-5 (*)    All other components within normal limits  LIPASE, BLOOD   ____________________________________________  EKG  none ____________________________________________  RADIOLOGY  CT abdomen and pelvis IMPRESSION: 1. Diverticulosis of the distal colon but no active inflammation identified. 2. Otherwise stable and negative for age CT appearance of the abdomen and pelvis. 3. Chronic severe lower lumbar degenerative spinal stenosis.   ____________________________________________   PROCEDURES  Procedure(s) performed: None  Critical Care performed: No  ____________________________________________   INITIAL IMPRESSION / ASSESSMENT AND PLAN / ED COURSE  Pertinent labs & imaging results that were available during my care of the patient were reviewed by me and considered in my medical decision making (see chart for details).  Amy Ray is a 69 y.o. female with history of GERD, CVA, diverticulitis, hyperlipidemia who presents for evaluation of 3 weeks gradual onset constant nonbloody loose stools as well as gradual onset aching left lower quadrant pain which began today. On exam, she is very well-appearing and in no acute distress. Vital signs stable, she is afebrile per she does have faint tenderness to palpation in the left lower quadrant without rebound or guarding. Plan for abdominal pain screening labs as well as CT of the  abdomen and pelvis to evaluate for diverticulitis given history of same. Of note, she had stool cultures, ova and parasites, fecal lactoferrin performed by her physician during the course of this illness and all have been unremarkable.  ----------------------------------------- 2:05 AM on 07/22/2015 -----------------------------------------  She reports she feels well at this time. She continues to appear well, additionally. Labs reviewed. CBC with mild anemia, hemoglobin 11.8. CMP with mild creatinine elevation at 1.20, fluids given. Normal lipase. Urinalysis is consistent with urinary tract infection. CT abdomen and pelvis shows no cause for her pain. Suspect her symptoms tonight may be secondary to urinary tract infection. Given her mild creatinine elevation, will not treat with Macrobid or Bactrim. We'll discharge with cipro. We discussed return precautions and need for close PCP follow-up as well as GI follow-up for continued workup of diarrhea. She voices understanding and is comfortable with the discharge plan. ____________________________________________   FINAL CLINICAL IMPRESSION(S) / ED DIAGNOSES  Final diagnoses:  LLQ abdominal pain  Loose stools  UTI (lower urinary tract infection)      Gayla Doss, MD 07/22/15 1610  Gayla Doss, MD 07/22/15 [phone]

## 2015-07-21 NOTE — ED Notes (Signed)
Patient ambulatory to triage with steady gait, without difficulty or distress noted; pt reports x 3 wks, "everytime I eat I go straight to the bathroom, not like diarrhea"; st seen by PCP and awaiting results; st soreness to left lower abd

## 2015-07-22 ENCOUNTER — Emergency Department: Payer: Federal, State, Local not specified - PPO

## 2015-07-22 ENCOUNTER — Encounter: Payer: Self-pay | Admitting: Radiology

## 2015-07-22 DIAGNOSIS — R1032 Left lower quadrant pain: Secondary | ICD-10-CM | POA: Diagnosis not present

## 2015-07-22 DIAGNOSIS — R197 Diarrhea, unspecified: Secondary | ICD-10-CM | POA: Diagnosis not present

## 2015-07-22 LAB — URINALYSIS COMPLETE WITH MICROSCOPIC (ARMC ONLY)
Bilirubin Urine: NEGATIVE
Glucose, UA: NEGATIVE mg/dL
HGB URINE DIPSTICK: NEGATIVE
Ketones, ur: NEGATIVE mg/dL
NITRITE: POSITIVE — AB
PH: 5 (ref 5.0–8.0)
PROTEIN: NEGATIVE mg/dL
SPECIFIC GRAVITY, URINE: 1.024 (ref 1.005–1.030)

## 2015-07-22 MED ORDER — IOHEXOL 300 MG/ML  SOLN
100.0000 mL | Freq: Once | INTRAMUSCULAR | Status: AC | PRN
  Administered 2015-07-22: 100 mL via INTRAVENOUS

## 2015-07-22 MED ORDER — CIPROFLOXACIN HCL 100 MG PO TABS: 100.0000 mg | ORAL_TABLET | Freq: Two times a day (BID) | ORAL | Status: DC

## 2015-07-22 MED ORDER — LEVOFLOXACIN 750 MG PO TABS
750.0000 mg | ORAL_TABLET | Freq: Once | ORAL | Status: AC
  Administered 2015-07-22: 750 mg via ORAL
  Filled 2015-07-22: qty 1

## 2015-07-22 NOTE — ED Notes (Signed)
Pharmacy called to clarify cipro dose.  Written for 100 mg twice daily.  Per dr Derrill Kay change to 500 mg twice daily.

## 2015-07-29 ENCOUNTER — Telehealth: Payer: Self-pay | Admitting: Family Medicine

## 2015-07-29 NOTE — Telephone Encounter (Signed)
Pt brought a stool sample in several weeks ago and never heard anything.  Her call back is  [phone]  Thanks, Barth Kirksteri

## 2015-07-29 NOTE — Telephone Encounter (Signed)
Patient notified of results. Patient stated that she does have some improvement. Patient said she will call back and let us know if symptoms worsen or do not resolve.

## 2015-07-29 NOTE — Telephone Encounter (Signed)
Please advise Stool study results?

## 2015-07-29 NOTE — Telephone Encounter (Signed)
Stool tests were all normal. If bowel habits are not getting back to normal, then she needs referral to gastro-enterology.

## 2015-08-06 ENCOUNTER — Encounter: Payer: Self-pay | Admitting: Family Medicine

## 2015-08-11 ENCOUNTER — Other Ambulatory Visit: Payer: Self-pay | Admitting: *Deleted

## 2015-08-11 MED ORDER — ASPIRIN-DIPYRIDAMOLE ER 25-200 MG PO CP12: 1.0000 | ORAL_CAPSULE | Freq: Two times a day (BID) | ORAL | Status: DC

## 2015-08-18 ENCOUNTER — Other Ambulatory Visit: Payer: Self-pay | Admitting: *Deleted

## 2015-08-18 NOTE — Telephone Encounter (Signed)
Patient is requesting rx be sent to [facility].

## 2015-08-19 MED ORDER — NORTRIPTYLINE HCL 10 MG PO CAPS: ORAL_CAPSULE | ORAL | Status: DC

## 2015-08-19 MED ORDER — SIMVASTATIN 20 MG PO TABS: 20.0000 mg | ORAL_TABLET | Freq: Every day | ORAL | Status: DC

## 2015-08-19 MED ORDER — ALPRAZOLAM 0.5 MG PO TABS: ORAL_TABLET | ORAL | Status: DC

## 2015-08-19 NOTE — Telephone Encounter (Signed)
Please call in alprazolam.  

## 2015-08-20 MED ORDER — NORTRIPTYLINE HCL 10 MG PO CAPS: ORAL_CAPSULE | ORAL | Status: DC

## 2015-08-20 MED ORDER — ALPRAZOLAM 0.5 MG PO TABS
ORAL_TABLET | ORAL | Status: DC
Start: 2015-08-20 — End: 2015-11-11

## 2015-08-20 MED ORDER — SIMVASTATIN 20 MG PO TABS: 20.0000 mg | ORAL_TABLET | Freq: Every day | ORAL | Status: DC

## 2015-08-20 NOTE — Telephone Encounter (Signed)
Faxed rx for alprazolam to [facility].

## 2015-08-26 ENCOUNTER — Other Ambulatory Visit: Payer: Self-pay | Admitting: *Deleted

## 2015-08-26 MED ORDER — ASPIRIN-DIPYRIDAMOLE ER 25-200 MG PO CP12: 1.0000 | ORAL_CAPSULE | Freq: Two times a day (BID) | ORAL | Status: DC

## 2015-08-26 MED ORDER — NORTRIPTYLINE HCL 10 MG PO CAPS: ORAL_CAPSULE | ORAL | Status: DC

## 2015-08-26 NOTE — Telephone Encounter (Signed)
Patient is requesting that we resend her r'x s for nortriptyline and aggrenox to [facility] with 90 day supply? Please advise?

## 2015-09-08 ENCOUNTER — Encounter: Payer: Self-pay | Admitting: Emergency Medicine

## 2015-09-08 ENCOUNTER — Emergency Department: Payer: Federal, State, Local not specified - PPO

## 2015-09-08 ENCOUNTER — Ambulatory Visit: Payer: Federal, State, Local not specified - PPO | Admitting: Family Medicine

## 2015-09-08 ENCOUNTER — Emergency Department
Admission: EM | Admit: 2015-09-08 | Discharge: 2015-09-08 | Disposition: A | Discharge status: Home / Self Care | Payer: Federal, State, Local not specified - PPO | Attending: Emergency Medicine | Admitting: Emergency Medicine

## 2015-09-08 DIAGNOSIS — J209 Acute bronchitis, unspecified: Secondary | ICD-10-CM | POA: Insufficient documentation

## 2015-09-08 DIAGNOSIS — J40 Bronchitis, not specified as acute or chronic: Secondary | ICD-10-CM | POA: Diagnosis not present

## 2015-09-08 DIAGNOSIS — R0602 Shortness of breath: Secondary | ICD-10-CM | POA: Diagnosis not present

## 2015-09-08 DIAGNOSIS — R0600 Dyspnea, unspecified: Secondary | ICD-10-CM

## 2015-09-08 DIAGNOSIS — R05 Cough: Secondary | ICD-10-CM | POA: Diagnosis not present

## 2015-09-08 DIAGNOSIS — I10 Essential (primary) hypertension: Secondary | ICD-10-CM | POA: Insufficient documentation

## 2015-09-08 LAB — COMPREHENSIVE METABOLIC PANEL
ALT: 23 U/L (ref 14–54)
AST: 28 U/L (ref 15–41)
Albumin: 3.4 g/dL — ABNORMAL LOW (ref 3.5–5.0)
Alkaline Phosphatase: 90 U/L (ref 38–126)
Anion gap: 3 — ABNORMAL LOW (ref 5–15)
BUN: 15 mg/dL (ref 6–20)
CHLORIDE: 110 mmol/L (ref 101–111)
CO2: 27 mmol/L (ref 22–32)
Calcium: 8.4 mg/dL — ABNORMAL LOW (ref 8.9–10.3)
Creatinine, Ser: 0.78 mg/dL (ref 0.44–1.00)
Glucose, Bld: 90 mg/dL (ref 65–99)
POTASSIUM: 3.8 mmol/L (ref 3.5–5.1)
SODIUM: 140 mmol/L (ref 135–145)
Total Bilirubin: 0.4 mg/dL (ref 0.3–1.2)
Total Protein: 6.8 g/dL (ref 6.5–8.1)

## 2015-09-08 LAB — CBC WITH DIFFERENTIAL/PLATELET
BASOS ABS: 0.1 10*3/uL (ref 0–0.1)
Basophils Relative: 1 %
EOS ABS: 0.1 10*3/uL (ref 0–0.7)
EOS PCT: 3 %
HCT: 34.9 % — ABNORMAL LOW (ref 35.0–47.0)
Hemoglobin: 11.7 g/dL — ABNORMAL LOW (ref 12.0–16.0)
LYMPHS PCT: 25 %
Lymphs Abs: 1.2 10*3/uL (ref 1.0–3.6)
MCH: 33.8 pg (ref 26.0–34.0)
MCHC: 33.5 g/dL (ref 32.0–36.0)
MCV: 100.9 fL — AB (ref 80.0–100.0)
MONO ABS: 0.5 10*3/uL (ref 0.2–0.9)
Monocytes Relative: 11 %
Neutro Abs: 3 10*3/uL (ref 1.4–6.5)
Neutrophils Relative %: 60 %
PLATELETS: 172 10*3/uL (ref 150–440)
RBC: 3.46 MIL/uL — AB (ref 3.80–5.20)
RDW: 12.7 % (ref 11.5–14.5)
WBC: 5 10*3/uL (ref 3.6–11.0)

## 2015-09-08 LAB — URINALYSIS COMPLETE WITH MICROSCOPIC (ARMC ONLY)
BILIRUBIN URINE: NEGATIVE
Glucose, UA: NEGATIVE mg/dL
HGB URINE DIPSTICK: NEGATIVE
KETONES UR: NEGATIVE mg/dL
NITRITE: NEGATIVE
PH: 5 (ref 5.0–8.0)
Protein, ur: NEGATIVE mg/dL
SPECIFIC GRAVITY, URINE: 1.026 (ref 1.005–1.030)

## 2015-09-08 LAB — BRAIN NATRIURETIC PEPTIDE: B Natriuretic Peptide: 83 pg/mL (ref 0.0–100.0)

## 2015-09-08 MED ORDER — ALBUTEROL SULFATE HFA 108 (90 BASE) MCG/ACT IN AERS: 2.0000 | INHALATION_SPRAY | Freq: Four times a day (QID) | RESPIRATORY_TRACT | Status: DC | PRN

## 2015-09-08 MED ORDER — AZITHROMYCIN 250 MG PO TABS: ORAL_TABLET | ORAL | Status: DC

## 2015-09-08 MED ORDER — HYDROCOD POLST-CPM POLST ER 10-8 MG/5ML PO SUER
5.0000 mL | Freq: Two times a day (BID) | ORAL | Status: DC
Start: 2015-09-08 — End: 2015-12-22

## 2015-09-08 NOTE — Discharge Instructions (Signed)

## 2015-09-08 NOTE — ED Provider Notes (Signed)
[facility] Emergency Department Provider Note     Time seen: ----------------------------------------- 10:16 AM on 09/08/2015 -----------------------------------------    I have reviewed the triage vital signs and the nursing notes.   HISTORY  Chief Complaint Shortness of Breath    HPI Amy Ray is a 69 y.o. female who presents to ER for shortness of breath and severe cough for the last week. Patient denies any fevers or chills, just states she short of breath and has been coughing for the last week. Nothing makes her symptoms better or worse. She denies any nausea vomiting or diarrhea.   Past Medical History  Diagnosis Date  . Stroke (HCC)   . TIA (transient ischemic attack)   . Vertigo   . History of diverticulitis of colon 05/20/2015    per patient report   . Hypertension     Patient Active Problem List   Diagnosis Date Noted  . Low back pain 05/20/2015  . Fatigue 05/20/2015  . History of CVA (cerebrovascular accident) 05/20/2015  . Headache 05/20/2015  . Insomnia 05/20/2015  . Obesity 05/20/2015  . Positive H. pylori test 05/20/2015  . Tremor 05/20/2015  . Dysfunction of eustachian tube 05/20/2015  . Diverticulosis 05/20/2015  . Essential (primary) hypertension 07/17/2007  . Alterations of sensations, late effect of cerebrovascular disease 03/11/2005  . Hyperlipidemia 10/12/2003  . Trigeminal neuralgia 10/12/2003    Past Surgical History  Procedure Laterality Date  . Replacement total knee Bilateral   . Cholecystectomy  2000  . Hernia repair      umbilical hernia as a child    Allergies Quinapril-hydrochlorothiazide  Social History Social History  Substance Use Topics  . Smoking status: Never Smoker   . Smokeless tobacco: Not on file  . Alcohol Use: No    Review of Systems Constitutional: Negative for fever. Eyes: Negative for visual changes. ENT: Negative for sore throat. Cardiovascular: Negative for chest  pain. Respiratory: Positive for shortness of breath and cough. Gastrointestinal: Negative for abdominal pain, vomiting and diarrhea. Genitourinary: Negative for dysuria. Musculoskeletal: Negative for back pain. Skin: Negative for rash. Neurological: Negative for headaches, focal weakness or numbness.  10-point ROS otherwise negative.  ____________________________________________   PHYSICAL EXAM:  VITAL SIGNS: ED Triage Vitals  Enc Vitals Group     BP --      Pulse --      Resp --      Temp --      Temp src --      SpO2 --      Weight --      Height --      Head Cir --      Peak Flow --      Pain Score --      Pain Loc --      Pain Edu? --      Excl. in GC? --     Constitutional: Alert and oriented. Well appearing and in no distress. Eyes: Conjunctivae are normal. PERRL. Normal extraocular movements. ENT   Head: Normocephalic and atraumatic.   Nose: No congestion/rhinnorhea.   Mouth/Throat: Mucous membranes are moist.   Neck: No stridor. Cardiovascular: Normal rate, regular rhythm. Normal and symmetric distal pulses are present in all extremities. No murmurs, rubs, or gallops. Respiratory: Normal respiratory effort without tachypnea nor retractions. Breath sounds are clear and equal bilaterally. No wheezes/rales/rhonchi. Gastrointestinal: Soft and nontender. No distention. No abdominal bruits.  Musculoskeletal: Nontender with normal range of motion in all extremities. No joint effusions.  No lower extremity tenderness nor edema. Neurologic:  Normal speech and language. No gross focal neurologic deficits are appreciated. Speech is normal. No gait instability. Skin:  Skin is warm, dry and intact. No rash noted. Psychiatric: Mood and affect are normal. Speech and behavior are normal. Patient exhibits appropriate insight and judgment. ___________________________________________  ED COURSE:  Pertinent labs & imaging results that were available during my care  of the patient were reviewed by me and considered in my medical decision making (see chart for details). Patient is in no acute distress, will check basic labs and chest x-Ray. ____________________________________________    LABS (pertinent positives/negatives)  Labs Reviewed  CBC WITH DIFFERENTIAL/PLATELET - Abnormal; Notable for the following:    RBC 3.46 (*)    Hemoglobin 11.7 (*)    HCT 34.9 (*)    MCV 100.9 (*)    All other components within normal limits  COMPREHENSIVE METABOLIC PANEL - Abnormal; Notable for the following:    Calcium 8.4 (*)    Albumin 3.4 (*)    Anion gap 3 (*)    All other components within normal limits  BRAIN NATRIURETIC PEPTIDE  URINALYSIS COMPLETEWITH MICROSCOPIC ([facility] ONLY)    RADIOLOGY Chest x-Ray IMPRESSION: No acute abnormalities. ____________________________________________  FINAL ASSESSMENT AND PLAN  Bronchitis  Plan: Patient with labs and imaging as dictated above. Patient is in no acute distress, labs are unremarkable. She'll be discharged with cough medication with inhalers.   [name], MD   [name], MD 09/08/15 [phone]

## 2015-09-08 NOTE — ED Notes (Signed)
Pt to ed via ems from home with c/o shortness of breath starting this am. Pt has had cough and chills x1week, pt states has productive cough, green/yellow in color and nasal drainage. Ems reports vss. 99% RA. Pt a/ox3 on arrival to ed, denies any pain at this time. edp at bedside on arrival.

## 2015-10-17 ENCOUNTER — Telehealth: Payer: Self-pay | Admitting: Family Medicine

## 2015-10-17 MED ORDER — CARBAMAZEPINE 200 MG PO TABS: 200.0000 mg | ORAL_TABLET | Freq: Two times a day (BID) | ORAL | Status: DC

## 2015-10-17 NOTE — Telephone Encounter (Signed)
Pt called saying [facility]  She said there is some confusion on her Rx's .  She is asking that a nurse please call her back.  Please call her back at [phone]  [facility]

## 2015-10-17 NOTE — Telephone Encounter (Signed)
Called patient. She states [facility] no [facility]. She dropped her [employer] insurance and is trying to get Medicare but wont be approved until 04/2016. Patient will be using [facility] pharmacy for all prescriptions except Carbamezipine and Diltiazem. Those prescriptions [facility] [place]. Patient needs a refill on Carbamezapine now.

## 2015-11-11 ENCOUNTER — Other Ambulatory Visit: Payer: Self-pay | Admitting: *Deleted

## 2015-11-11 MED ORDER — ALPRAZOLAM 0.5 MG PO TABS: ORAL_TABLET | ORAL | Status: DC

## 2015-11-11 MED ORDER — NORTRIPTYLINE HCL 10 MG PO CAPS: ORAL_CAPSULE | ORAL | Status: DC

## 2015-11-24 ENCOUNTER — Other Ambulatory Visit: Payer: Self-pay | Admitting: Family Medicine

## 2015-11-24 MED ORDER — DILTIAZEM HCL ER 180 MG PO CP24: 360.0000 mg | ORAL_CAPSULE | Freq: Every day | ORAL | Status: DC

## 2015-11-24 MED ORDER — ASPIRIN-DIPYRIDAMOLE ER 25-200 MG PO CP12
1.0000 | ORAL_CAPSULE | Freq: Two times a day (BID) | ORAL | Status: DC
Start: 2015-11-24 — End: 2015-11-26

## 2015-11-24 NOTE — Telephone Encounter (Signed)
Pt contacted office for refill request on the following medications: dipyridamole-aspirin (AGGRENOX) 200-25 MG 12hr capsule to [facility]. Thanks TNP

## 2015-11-24 NOTE — Telephone Encounter (Signed)
Due to medication cost patient would like dipyridamole/Asprin and diltiazem sent to [facility] with 30 day supply.

## 2015-11-25 ENCOUNTER — Telehealth: Payer: Self-pay | Admitting: Family Medicine

## 2015-11-25 MED ORDER — DIPYRIDAMOLE 50 MG PO TABS: 50.0000 mg | ORAL_TABLET | Freq: Three times a day (TID) | ORAL | Status: DC

## 2015-11-25 NOTE — Telephone Encounter (Signed)
Pt states the Rx dipyridamole-aspirin (AGGRENOX) 200-25 MG 12hr capsule is going to be $112.00 and she can not afford this.  Pt is requesting a different Rx.  [facility] [place].  [phone]

## 2015-11-25 NOTE — Telephone Encounter (Signed)
Patient notified. Patient stated that she is okay with taking x3 times a day. Rx sent to pharmacy.

## 2015-11-25 NOTE — Telephone Encounter (Signed)
Please advise 

## 2015-11-25 NOTE — Telephone Encounter (Signed)
Can change to dipyridimole , 2 tablets THREE times a day, #180, rf x5 if she doesn't mind taking something Three times a day.  AND take OTC  aspirin every day.

## 2015-11-26 ENCOUNTER — Other Ambulatory Visit: Payer: Self-pay | Admitting: Family Medicine

## 2015-11-26 MED ORDER — CLOPIDOGREL BISULFATE 75 MG PO TABS: 75.0000 mg | ORAL_TABLET | Freq: Every day | ORAL | Status: DC

## 2015-11-26 NOTE — Progress Notes (Signed)
Please advise patient insurance would not cover dipyridamole. Have changed to Plavix  which she is to take once a day, and  coated aspirin once a day.

## 2015-11-26 NOTE — Progress Notes (Signed)
Patient advised and verbally voiced understanding.  

## 2015-11-26 NOTE — Progress Notes (Signed)
LMOVM for pt to return call 

## 2015-12-15 ENCOUNTER — Telehealth: Payer: Self-pay | Admitting: Family Medicine

## 2015-12-15 NOTE — Telephone Encounter (Signed)
Pt stated that her diltiazem (DILACOR XR) 180 MG 24 hr capsule cost 30 $ a month and would like something cheaper [facility] to [facility] [place]. Thanks TNP

## 2015-12-16 NOTE — Telephone Encounter (Signed)
She will need to wean off the diltiazem before starting a new BP medication. First she will need to reduce dose to one tablet a day(she currently takes 2) and schedule a follow up office visit in 2 weeks.

## 2015-12-16 NOTE — Telephone Encounter (Signed)
Patient stated that she can not afford to come in for an ov until July. Patient stated that she will not have insurance coverage until then.

## 2015-12-20 ENCOUNTER — Emergency Department
Admission: EM | Admit: 2015-12-20 | Discharge: 2015-12-21 | Disposition: A | Discharge status: Home / Self Care | Payer: Federal, State, Local not specified - PPO | Attending: Emergency Medicine | Admitting: Emergency Medicine

## 2015-12-20 ENCOUNTER — Encounter: Payer: Self-pay | Admitting: Emergency Medicine

## 2015-12-20 ENCOUNTER — Emergency Department: Payer: Federal, State, Local not specified - PPO

## 2015-12-20 DIAGNOSIS — I10 Essential (primary) hypertension: Secondary | ICD-10-CM | POA: Diagnosis not present

## 2015-12-20 DIAGNOSIS — R251 Tremor, unspecified: Secondary | ICD-10-CM | POA: Diagnosis not present

## 2015-12-20 DIAGNOSIS — Z7951 Long term (current) use of inhaled steroids: Secondary | ICD-10-CM | POA: Insufficient documentation

## 2015-12-20 DIAGNOSIS — Z79899 Other long term (current) drug therapy: Secondary | ICD-10-CM | POA: Diagnosis not present

## 2015-12-20 DIAGNOSIS — R531 Weakness: Secondary | ICD-10-CM | POA: Diagnosis present

## 2015-12-20 DIAGNOSIS — Z7982 Long term (current) use of aspirin: Secondary | ICD-10-CM | POA: Diagnosis not present

## 2015-12-20 LAB — BASIC METABOLIC PANEL
Anion gap: 7 (ref 5–15)
BUN: 14 mg/dL (ref 6–20)
CHLORIDE: 109 mmol/L (ref 101–111)
CO2: 24 mmol/L (ref 22–32)
CREATININE: 0.88 mg/dL (ref 0.44–1.00)
Calcium: 8.5 mg/dL — ABNORMAL LOW (ref 8.9–10.3)
Glucose, Bld: 105 mg/dL — ABNORMAL HIGH (ref 65–99)
Potassium: 3.4 mmol/L — ABNORMAL LOW (ref 3.5–5.1)
SODIUM: 140 mmol/L (ref 135–145)

## 2015-12-20 LAB — CBC
HCT: 39.2 % (ref 35.0–47.0)
HEMOGLOBIN: 13.2 g/dL (ref 12.0–16.0)
MCH: 33.9 pg (ref 26.0–34.0)
MCHC: 33.7 g/dL (ref 32.0–36.0)
MCV: 100.8 fL — AB (ref 80.0–100.0)
PLATELETS: 177 10*3/uL (ref 150–440)
RBC: 3.89 MIL/uL (ref 3.80–5.20)
RDW: 13 % (ref 11.5–14.5)
WBC: 7.9 10*3/uL (ref 3.6–11.0)

## 2015-12-20 LAB — URINALYSIS COMPLETE WITH MICROSCOPIC (ARMC ONLY)
BACTERIA UA: NONE SEEN
Bilirubin Urine: NEGATIVE
GLUCOSE, UA: NEGATIVE mg/dL
HGB URINE DIPSTICK: NEGATIVE
Ketones, ur: NEGATIVE mg/dL
LEUKOCYTES UA: NEGATIVE
Nitrite: NEGATIVE
PROTEIN: NEGATIVE mg/dL
SPECIFIC GRAVITY, URINE: 1.023 (ref 1.005–1.030)
pH: 5 (ref 5.0–8.0)

## 2015-12-20 LAB — TROPONIN I

## 2015-12-20 NOTE — Discharge Instructions (Signed)
You have been seen in the Emergency Department (ED) for a tremor and feeling weak in the legs.    As we have discussed, please follow up with your primary care doctor as soon as possible regarding todays Emergency Department (ED) visit and your symptoms.    Call your doctor or return to the ED if you have a worsening tremor, headache, sudden and severe headache, confusion, slurred speech, facial droop, weakness or numbness in any arm or leg, extreme fatigue, vision problems, or other symptoms that concern you.   Tremor A tremor is trembling or shaking that you cannot control. Most tremors affect the hands or arms. Tremors can also affect the head, vocal cords, face, and other parts of the body. There are many types of tremors. Common types include:   Essential tremor. These usually occur in people over the age of 840. It may run in families and can happen in otherwise healthy people.   Resting tremor. These occur when the muscles are at rest, such as when your hands are resting in your lap. People with Parkinson disease often have resting tremors.   Postural tremor. These occur when you try to hold a pose, such as keeping your hands outstretched.   Kinetic tremor. These occur during purposeful movement, such as trying to touch a finger to your nose.   Task-specific tremor. These may occur when you perform tasks such as handwriting, speaking, or standing.   Psychogenic tremor. These dramatically lessen or disappear when you are distracted. They can happen in people of all ages.  Some types of tremors have no known cause. Tremors can also be a symptom of nervous system problems (neurological disorders) that may occur with aging. Some tremors go away with treatment while others do not.  HOME CARE INSTRUCTIONS Watch your tremor for any changes. The following actions may help to lessen any discomfort you are feeling:  Take medicines only as directed by your health care provider.   Limit  alcohol intake to no more than 1 drink per day for nonpregnant women and 2 drinks per day for men. One drink equals 12 oz of beer, 5 oz of wine, or 1 oz of hard liquor.  Do not use any tobacco products, including cigarettes, chewing tobacco, or electronic cigarettes. If you need help quitting, ask your health care provider.   Avoid extreme heat or cold.   Limit the amount of caffeine you consumeas directed by your health care provider.   Try to get 8 hours of sleep each night.  Find ways to manage your stress, such as meditation or yoga.  Keep all follow-up visits as directed by your health care provider. This is important. SEEK MEDICAL CARE IF:  You start having a tremor after starting a new medicine.  You have tremor with other symptoms such as:  Numbness.  Tingling.  Pain.  Weakness.  Your tremor gets worse.  Your tremor interferes with your day-to-day life.   This information is not intended to replace advice given to you by your health care provider. Make sure you discuss any questions you have with your health care provider.   Document Released: 09/17/2002 Document Revised: 10/18/2014 Document Reviewed: 03/25/2014 Elsevier Interactive Patient Education [employer]2016 [employer] Inc.

## 2015-12-20 NOTE — ED Provider Notes (Signed)
[facility] Emergency Department Provider Note  ____________________________________________  Time seen: Approximately 10:14 PM  I have reviewed the triage vital signs and the nursing notes.   HISTORY  Chief Complaint Weakness  Shaky and feeling weak in the legs  HPI Amy Ray is a 70 y.o. female who reports that about 30 minutes prior to arrival she was showing her husband pictures, and then she went to stand up and certainly started feeling very shaky in her legs and weak in both legs. Denies numbness or tingling. She reports her symptoms are better when she is laying down. No facial droop, no trouble speaking. No chest pain no shortness of breath. Denies headache. She reports is rather sudden in onset, and she does feels very tremulous and shaky in the legs.     Past Medical History  Diagnosis Date  . Stroke (HCC)   . TIA (transient ischemic attack)   . Vertigo   . History of diverticulitis of colon 05/20/2015    per patient report   . Hypertension     Patient Active Problem List   Diagnosis Date Noted  . Low back pain 05/20/2015  . Fatigue 05/20/2015  . History of CVA (cerebrovascular accident) 05/20/2015  . Headache 05/20/2015  . Insomnia 05/20/2015  . Obesity 05/20/2015  . Positive H. pylori test 05/20/2015  . Tremor 05/20/2015  . Dysfunction of eustachian tube 05/20/2015  . Diverticulosis 05/20/2015  . Essential (primary) hypertension 07/17/2007  . Alterations of sensations, late effect of cerebrovascular disease 03/11/2005  . Hyperlipidemia 10/12/2003  . Trigeminal neuralgia 10/12/2003    Past Surgical History  Procedure Laterality Date  . Replacement total knee Bilateral   . Cholecystectomy  2000  . Hernia repair      umbilical hernia as a child    Current Outpatient Rx  Name  Route  Sig  Dispense  Refill  . albuterol (PROVENTIL HFA;VENTOLIN HFA) 108 (90 BASE) MCG/ACT inhaler   Inhalation   Inhale 2 puffs into the lungs  every 6 (six) hours as needed for wheezing or shortness of breath.   1 Inhaler   2   . ALPRAZolam (XANAX) 0.5 MG tablet      1/2 to 1 tablet up to 2 times daily as needed   90 tablet   1     Dispense as written.   Marland Kitchen. aspirin 81 MG tablet   Oral   Take 1 tablet by mouth daily.         Marland Kitchen. azithromycin (ZITHROMAX Z-PAK) 250 MG tablet      Take 2 tablets (500 mg) on  Day 1,  followed by 1 tablet (250 mg) once daily on Days 2 through 5.   4 each   0   . carbamazepine (TEGRETOL) 200 MG tablet   Oral   Take 1 tablet (200 mg total) by mouth 2 (two) times daily.   180 tablet   1   . chlorpheniramine-HYDROcodone (TUSSIONEX PENNKINETIC ER) 10-8 MG/5ML SUER   Oral   Take 5 mLs by mouth 2 (two) times daily.   140 mL   0   . clopidogrel (PLAVIX) 75 MG tablet   Oral   Take 1 tablet (75 mg total) by mouth daily.   90 tablet   4   . diltiazem (DILACOR XR) 180 MG 24 hr capsule   Oral   Take 2 capsules (360 mg total) by mouth daily.   60 capsule   3   . fluticasone (FLONASE)  50 MCG/ACT nasal spray   Each Nare   Place 2 sprays into both nostrils at bedtime.         . IRON PO   Oral   Take 1 tablet by mouth daily.         . nortriptyline (PAMELOR) 10 MG capsule      1-2 Capsule, Oral, at bedtime   180 capsule   3     DX: 350.1   . ondansetron (ZOFRAN ODT) 8 MG disintegrating tablet   Oral   Take 1 tablet (8 mg total) by mouth every 8 (eight) hours as needed for nausea or vomiting.   20 tablet   0   . ranitidine (ZANTAC) 150 MG capsule   Oral   Take 1 capsule (150 mg total) by mouth 2 (two) times daily.   28 capsule   0   . simvastatin (ZOCOR) 20 MG tablet   Oral   Take 1 tablet (20 mg total) by mouth daily.   90 tablet   3     Allergies Quinapril-hydrochlorothiazide  Family History  Problem Relation Age of Onset  . Heart attack Mother   . Hypertension Mother   . Alzheimer's disease Father   . Diabetes Father   . Hypertension Sister   .  Diabetes Brother   . Hypertension Sister   . Hypertension Sister   . Hypertension Sister   . Hypertension Sister   . Breast cancer Sister     Social History Social History  Substance Use Topics  . Smoking status: Never Smoker   . Smokeless tobacco: None  . Alcohol Use: No    Review of Systems Constitutional: No fever/chills. No recent illness. Eyes: No visual changes. ENT: No sore throat. Cardiovascular: Denies chest pain. Respiratory: Denies shortness of breath. Gastrointestinal: No abdominal pain.  No nausea, no vomiting.  No diarrhea.  No constipation. Genitourinary: Negative for dysuria. Musculoskeletal: Negative for back pain. Skin: Negative for rash. Neurological: Negative for headaches, focal weakness or numbness. She reports both of her legs feel shaky and weak.  10-point ROS otherwise negative.  ____________________________________________   PHYSICAL EXAM:  VITAL SIGNS: ED Triage Vitals  Enc Vitals Group     BP --      Pulse --      Resp --      Temp --      Temp src --      SpO2 --      Weight --      Height --      Head Cir --      Peak Flow --      Pain Score --      Pain Loc --      Pain Edu? --      Excl. in GC? --    Constitutional: Alert and oriented. Well appearing and in no acute distress.Pleasant amicable. Eyes: Conjunctivae are normal. PERRL. EOMI. Head: Atraumatic. Nose: No congestion/rhinnorhea. Mouth/Throat: Mucous membranes are moist.  Oropharynx non-erythematous. Neck: No stridor.   Cardiovascular: Normal rate, regular rhythm. Grossly normal heart sounds.  Good peripheral circulation. Respiratory: Normal respiratory effort.  No retractions. Lungs CTAB. Gastrointestinal: Soft and nontender. No distention. No abdominal bruits. No CVA tenderness. Musculoskeletal: No lower extremity tenderness nor edema.  No joint effusions. 5 out of 5 strength in all extremities. Mild tremulousness noted in all 4 extremities, no major tremor noted  but does seem slightly tremulous. Neurologic:  Normal speech and language. No gross focal neurologic  deficits are appreciated.   NIH score equals 0, performed by me at bedside. The patient has no pronator drift. The patient has normal cranial nerve exam. Extraocular movements are normal. Visual fields are normal. Patient has 5 out of 5 strength in all extremities though a slight tremors noted in all extremities. There is no numbness or gross, acute sensory abnormality in the extremities bilaterally. No speech disturbance. No dysarthria. No aphasia. No ataxia. Normal finger nose finger bilat. Patient speaking in full and clear sentences.   Skin:  Skin is warm, dry and intact. No rash noted. Psychiatric: Mood and affect are normal. Speech and behavior are normal.  ____________________________________________   LABS (all labs ordered are listed, but only abnormal results are displayed)  Labs Reviewed  CBC - Abnormal; Notable for the following:    MCV 100.8 (*)    All other components within normal limits  TROPONIN I  URINALYSIS COMPLETEWITH MICROSCOPIC ([facility] ONLY)  BASIC METABOLIC PANEL   ____________________________________________  EKG  ED ECG REPORT I, QUALE, MARK, the attending physician, personally viewed and interpreted this ECG.  Date: 12/20/2015 EKG Time: 2305 Rate: 70 Rhythm: normal sinus rhythm QRS Axis: normal Intervals: normal ST/T Wave abnormalities: normal Conduction Disturbances: none Narrative Interpretation: unremarkable  ____________________________________________  RADIOLOGY  CT Head Wo Contrast (Final result) Result time: 12/20/15 22:42:35   Final result by Rad Results In Interface (12/20/15 22:42:35)   Narrative:   CLINICAL DATA: Acute onset tremors with weakness in the legs.  EXAM: CT HEAD WITHOUT CONTRAST  TECHNIQUE: Contiguous axial images were obtained from the base of the skull through the vertex without intravenous  contrast.  COMPARISON: 05/11/2015  FINDINGS: Skull and Sinuses:Negative for fracture or destructive process. The visualized mastoids, middle ears, and imaged paranasal sinuses are clear.  Visualized orbits: Negative.  Brain: No evidence of acute infarction, hemorrhage, hydrocephalus, or mass lesion/mass effect.  Chronic patchy cerebral white matter low density, confluent in the parietal region, consistent with chronic small vessel disease.  IMPRESSION: 1. No acute finding or change since 2016. 2. Moderate chronic small vessel disease.   Electronically Signed By: Marnee Spring M.D. On: 12/20/2015 22:42    ____________________________________________   PROCEDURES  Procedure(s) performed: None  Critical Care performed: No  ____________________________________________   INITIAL IMPRESSION / ASSESSMENT AND PLAN / ED COURSE  Pertinent labs & imaging results that were available during my care of the patient were reviewed by me and considered in my medical decision making (see chart for details).  The patient rinse for a rather acute evaluation of lower extremity and hand tremulousness, and also feeling very weak in the legs and she went to stand. Overall her neurologic exam does not demonstrate obvious acute focal abnormality, and her NIH score is 0. She does have mild tremulousness in all extremities. Denies chest pain or shortness of breath. No acute cardiopulmonary symptoms. She has 5 out of 5 strength in all lower extremities, is not complaining of back pain and has no loss sensation. A little unclear as to the exact etiology, but we will evaluate his CT to exclude intracranial hemorrhage so I find this highly unlikely. EKG, basic labs and close observation. The present time no clear explanation. Patient did report she had a recent medication change, however she has not begun taking her new medicine yet. No other new medication changes. No infectious  symptoms.   ----------------------------------------- 10:41 PM on 12/20/2015 -----------------------------------------  Reevaluated the patient. Tremulousness seems to be improving. She does report that now that  she's thinking back when she got up out of bed this morning she felt a little shaky and weak similar to what she felt this evening. She denies any numbness tingling or weakness. Neurologically appears completely intact with just a very minimal tremor now it seems to be much improved. Given her mild weakness this morning, I would also obtain a urinalysis to evaluate for the possibility of mild infectious etiology. Thus far her exam is very reassuring.  ----------------------------------------- 11:10 PM on 12/20/2015 -----------------------------------------  Patient resting comfortably. Reports improvement. In no distress. CT the head reassuring. At this point I do not believe the patient acute neurologic event, she had mild tremor involving all extremities. This is improving. Plan to trial ambulation, follow up on basic labs, urinalysis, and reevaluation assigned to Dr. Inocencio Homes. My thought is that likely the patient be appropriate for discharge if her symptoms are improving, labs reassuring as I do not find anything to suggest focality or unilateral abnormality suggesting acute infarction or stroke. She does not have symptoms of neck pain or myelitis, in addition she is afebrile, and no traumatic injury or other clear acute cause noted by clinical history or examination at this time.  Patient has been up and ambulated well. She reports feeling much better at this time. No distress. Very reassuring that her symptoms are improving. Follow-up on urinalysis and chemistry assigned to Dr. Inocencio Homes. If no concerned there, likely discharge to home. Patient and her family do report that she has had similar symptoms as this 3 or so times in the past, never with clear explanation of that she has been told  might be due to "her nerves". Currently no tremor. ____________________________________________   FINAL CLINICAL IMPRESSION(S) / ED DIAGNOSES  Final diagnoses:  Tremor      Sharyn Creamer, MD 12/20/15 2348

## 2015-12-20 NOTE — ED Notes (Signed)
Pt. Here for shaking weak legs.  Pt. States this morning her legs felt weak and shaky.  Pt. States she was able to recover and go shopping in the afternoon.  Pt. States in the evening she tried to get up and her legs started shaking again and was unable to stand.

## 2015-12-21 NOTE — ED Notes (Signed)
Going over discharge instructions with patient.  When pt. Got up to move to wheelchair, pt. Stated she felt dizzy.  Pt. Sat back into bed.

## 2015-12-21 NOTE — ED Provider Notes (Signed)
-----------------------------------------   12:24 AM on 12/21/2015 -----------------------------------------  There was assumed from Dr. Fanny BienQuale at approximately 11:30 PM. Briefly this is a 70 year old female who presented with legs shaking and generalized weakness which has resolved. She currently reports that she feels well. She is ambulatory without any assistance. I reviewed her labwork. Negative troponin. CBC and BMP are generally unremarkable. Urinalysis is likely contaminated given multiple squamous cells and as the patient denies any specific urinary complaints, I discussed with her that we would not treat her with antibiotics right now and that we would send urine cultures. She reports she feels well, we discussed return precautions, need for close PCP follow-up and she is, comfortable with the discharge plan. DC home.  Gayla DossEryka A Alberto Pina, MD 12/21/15 [phone]

## 2015-12-22 ENCOUNTER — Ambulatory Visit (INDEPENDENT_AMBULATORY_CARE_PROVIDER_SITE_OTHER): Payer: Medicare Other | Admitting: Family Medicine

## 2015-12-22 ENCOUNTER — Encounter: Payer: Self-pay | Admitting: Family Medicine

## 2015-12-22 DIAGNOSIS — R251 Tremor, unspecified: Secondary | ICD-10-CM

## 2015-12-22 NOTE — Progress Notes (Signed)
Patient: Amy Ray Female    DOB: [birth year]   70 y.o.   MRN: [id number] Visit Date: 12/22/2015  Today's Provider: Mila Merry, MD   Chief Complaint  Patient presents with  . Follow-up  . Tremors    follow up   Subjective:    HPI   Follow up ER visit/ Follow up Tremors  Patient was seen in ER for Weakness in legs and Tremors on 12/20/2015 at [facility]. During ER visit, labs, UA, EKG and CT of the head were ordered and done. Labs were remarkable only for low calcium levels. She states she has had a few similar episodes in the past, but note this dramatic.  She reports this condition is resolved. ------------------------------------------------------------------------------------ Results for orders placed or performed during the hospital encounter of 12/20/15  Urine culture  Result Value Ref Range   Specimen Description URINE, CLEAN CATCH    Special Requests NONE    Culture TOO YOUNG TO READ    Report Status PENDING   CBC  Result Value Ref Range   WBC 7.9 3.6 - 11.0 K/uL   RBC 3.89 3.80 - 5.20 MIL/uL   Hemoglobin 13.2 12.0 - 16.0 g/dL   HCT 40.9 81.1 - 91.4 %   MCV 100.8 (H) 80.0 - 100.0 fL   MCH 33.9 26.0 - 34.0 pg   MCHC 33.7 32.0 - 36.0 g/dL   RDW 78.2 95.6 - 21.3 %   Platelets 177 150 - 440 K/uL  Troponin I  Result Value Ref Range   Troponin I <0.03 <0.031 ng/mL  Urinalysis complete, with microscopic ([facility] only)  Result Value Ref Range   Color, Urine YELLOW (A) YELLOW   APPearance CLOUDY (A) CLEAR   Glucose, UA NEGATIVE NEGATIVE mg/dL   Bilirubin Urine NEGATIVE NEGATIVE   Ketones, ur NEGATIVE NEGATIVE mg/dL   Specific Gravity, Urine 1.023 1.005 - 1.030   Hgb urine dipstick NEGATIVE NEGATIVE   pH 5.0 5.0 - 8.0   Protein, ur NEGATIVE NEGATIVE mg/dL   Nitrite NEGATIVE NEGATIVE   Leukocytes, UA NEGATIVE NEGATIVE   RBC / HPF 6-30 0 - 5 RBC/hpf   WBC, UA 6-30 0 - 5 WBC/hpf   Bacteria, UA NONE SEEN NONE SEEN   Squamous Epithelial / LPF 6-30 (A) NONE  SEEN   Mucous PRESENT    Hyaline Casts, UA PRESENT   Basic metabolic panel  Result Value Ref Range   Sodium 140 135 - 145 mmol/L   Potassium 3.4 (L) 3.5 - 5.1 mmol/L   Chloride 109 101 - 111 mmol/L   CO2 24 22 - 32 mmol/L   Glucose, Bld 105 (H) 65 - 99 mg/dL   BUN 14 6 - 20 mg/dL   Creatinine, Ser 0.86 0.44 - 1.00 mg/dL   Calcium 8.5 (L) 8.9 - 10.3 mg/dL   GFR calc non Af Amer >60 >60 mL/min   GFR calc Af Amer >60 >60 mL/min   Anion gap 7 5 - 15        Allergies  Allergen Reactions  . Quinapril-Hydrochlorothiazide Other (See Comments)    Other reaction(s): Headache   Previous Medications   ALBUTEROL (PROVENTIL HFA;VENTOLIN HFA) 108 (90 BASE) MCG/ACT INHALER    Inhale 2 puffs into the lungs every 6 (six) hours as needed for wheezing or shortness of breath.   ALPRAZOLAM (XANAX) 0.5 MG TABLET    1/2 to 1 tablet up to 2 times daily as needed   ASPIRIN 81 MG TABLET  Take 1 tablet by mouth daily.   CARBAMAZEPINE (TEGRETOL) 200 MG TABLET    Take 1 tablet (200 mg total) by mouth 2 (two) times daily.   CLOPIDOGREL (PLAVIX) 75 MG TABLET    Take 1 tablet (75 mg total) by mouth daily.   DILTIAZEM (DILACOR XR) 180 MG 24 HR CAPSULE    Take 2 capsules (360 mg total) by mouth daily.   FLUTICASONE (FLONASE) 50 MCG/ACT NASAL SPRAY    Place 2 sprays into both nostrils at bedtime.   IRON PO    Take 1 tablet by mouth daily.   NORTRIPTYLINE (PAMELOR) 10 MG CAPSULE    1-2 Capsule, Oral, at bedtime   ONDANSETRON (ZOFRAN ODT) 8 MG DISINTEGRATING TABLET    Take 1 tablet (8 mg total) by mouth every 8 (eight) hours as needed for nausea or vomiting.   RANITIDINE (ZANTAC) 150 MG CAPSULE    Take 1 capsule (150 mg total) by mouth 2 (two) times daily.   SIMVASTATIN (ZOCOR) 20 MG TABLET    Take 1 tablet (20 mg total) by mouth daily.    Review of Systems  Constitutional: Negative for fever, chills, appetite change and fatigue.  Respiratory: Negative for chest tightness and shortness of breath.     Cardiovascular: Negative for chest pain and palpitations.  Gastrointestinal: Negative for nausea, vomiting and abdominal pain.  Neurological: Negative for dizziness and weakness.    Social History  Substance Use Topics  . Smoking status: Never Smoker   . Smokeless tobacco: Not on file  . Alcohol Use: No   Objective:   BP 130/88 mmHg  Pulse 96  Temp(Src) 98.3 F (36.8 C) (Oral)  Resp 18  Wt 219 lb (99.338 kg)  Physical Exam  General Appearance:    Alert, cooperative, no distress, obese  Eyes:    PERRL, conjunctiva/corneas clear, EOM's intact       Lungs:     Clear to auscultation bilaterally, respirations unlabored  Heart:    Regular rate and rhythm  Neurologic:   Awake, alert, oriented x 3. No apparent focal neurological           defect. MS +5/5. No tremors.          Assessment & Plan:     1. Hypocalcemia  - Magnesium - Calcium - Parathyroid hormone, intact (no Ca)  2. Tremor Resolved.       Mila Merryonald Fisher, MD  [facility]

## 2015-12-23 LAB — URINE CULTURE

## 2015-12-24 LAB — PARATHYROID HORMONE, INTACT (NO CA): PTH: 66 pg/mL — ABNORMAL HIGH (ref 15–65)

## 2015-12-24 LAB — CALCIUM: Calcium: 9.1 mg/dL (ref 8.7–10.3)

## 2015-12-24 LAB — MAGNESIUM: Magnesium: 2.3 mg/dL (ref 1.6–2.3)

## 2015-12-26 NOTE — Telephone Encounter (Signed)
-----   Message from Malva Limesonald E Fisher, MD sent at 12/26/2015  8:18 AM EDT ----- Labs are all normal. Calcium levels are back to normal. No further testing needed.

## 2015-12-26 NOTE — Progress Notes (Signed)
LMTCB

## 2015-12-26 NOTE — Telephone Encounter (Signed)
Patient advised as below.  

## 2016-03-31 ENCOUNTER — Other Ambulatory Visit: Payer: Self-pay | Admitting: Family Medicine

## 2016-04-16 ENCOUNTER — Other Ambulatory Visit: Payer: Self-pay | Admitting: Family Medicine

## 2016-04-16 DIAGNOSIS — Z1231 Encounter for screening mammogram for malignant neoplasm of breast: Secondary | ICD-10-CM

## 2016-04-22 DIAGNOSIS — M19012 Primary osteoarthritis, left shoulder: Secondary | ICD-10-CM | POA: Diagnosis not present

## 2016-04-22 DIAGNOSIS — M19011 Primary osteoarthritis, right shoulder: Secondary | ICD-10-CM | POA: Diagnosis not present

## 2016-04-22 DIAGNOSIS — Z8673 Personal history of transient ischemic attack (TIA), and cerebral infarction without residual deficits: Secondary | ICD-10-CM | POA: Diagnosis not present

## 2016-04-29 ENCOUNTER — Ambulatory Visit
Admission: RE | Admit: 2016-04-29 | Discharge: 2016-04-29 | Disposition: A | Discharge status: Home / Self Care | Payer: Medicare Other | Source: Ambulatory Visit | Attending: Family Medicine | Admitting: Family Medicine

## 2016-04-29 ENCOUNTER — Other Ambulatory Visit: Payer: Self-pay | Admitting: Family Medicine

## 2016-04-29 DIAGNOSIS — Z1231 Encounter for screening mammogram for malignant neoplasm of breast: Secondary | ICD-10-CM

## 2016-05-04 ENCOUNTER — Other Ambulatory Visit: Payer: Self-pay | Admitting: Family Medicine

## 2016-05-18 ENCOUNTER — Other Ambulatory Visit: Payer: Self-pay | Admitting: *Deleted

## 2016-05-18 MED ORDER — ALPRAZOLAM 0.5 MG PO TABS: ORAL_TABLET | ORAL | 1 refills | Status: DC

## 2016-07-12 ENCOUNTER — Ambulatory Visit (INDEPENDENT_AMBULATORY_CARE_PROVIDER_SITE_OTHER): Payer: Medicare Other | Admitting: Family Medicine

## 2016-07-12 ENCOUNTER — Encounter: Payer: Self-pay | Admitting: Family Medicine

## 2016-07-12 VITALS — BP 138/84 | HR 84 | Temp 98.2°F | Resp 16 | Wt 215.0 lb

## 2016-07-12 DIAGNOSIS — Z8673 Personal history of transient ischemic attack (TIA), and cerebral infarction without residual deficits: Secondary | ICD-10-CM

## 2016-07-12 DIAGNOSIS — Z23 Encounter for immunization: Secondary | ICD-10-CM

## 2016-07-12 DIAGNOSIS — E785 Hyperlipidemia, unspecified: Secondary | ICD-10-CM | POA: Diagnosis not present

## 2016-07-12 DIAGNOSIS — I10 Essential (primary) hypertension: Secondary | ICD-10-CM

## 2016-07-12 DIAGNOSIS — G500 Trigeminal neuralgia: Secondary | ICD-10-CM

## 2016-07-12 NOTE — Progress Notes (Signed)
Patient: Amy Ray Female    DOB: [birth year]   70 y.o.   MRN: [id number] Visit Date: 07/12/2016  Today's Provider: Mila Merry, MD   Chief Complaint  Patient presents with  . Hypertension  . Hyperlipidemia  . Cerebrovascular Accident   Subjective:    HPI      Hypertension, follow-up:  BP Readings from Last 3 Encounters:  07/12/16 138/84  12/22/15 130/88  12/21/15 140/66    She was last seen for hypertension 1 years ago.  BP at that visit was 120/84. Management since that visit includes none. She reports excellent compliance with treatment. She is not having side effects. She is exercising. Rides bike. She is adherent to low salt diet.   Outside blood pressures are low usually less than 142/94. She is experiencing fatigue.  Patient denies chest pain, chest pressure/discomfort, claudication, dyspnea, exertional chest pressure/discomfort, irregular heart beat, lower extremity edema, near-syncope, orthopnea, palpitations and syncope.   Cardiovascular risk factors include advanced age (older than 29 for men, 67 for women), dyslipidemia and hypertension.      Weight trend: fluctuating a bit Wt Readings from Last 3 Encounters:  07/12/16 215 lb (97.5 kg)  12/22/15 219 lb (99.3 kg)  09/08/15 215 lb (97.5 kg)    Current diet: in general, an "unhealthy" diet  ------------------------------------------------------------------------   Lipid/Cholesterol, Follow-up:   Last seen for this1 years ago.  Management changes since that visit include none; continue Simvastatin. . Last Lipid Panel: No results found for: CHOL, TRIG, HDL, CHOLHDL, VLDL, LDLCALC, LDLDIRECT   She reports excellent compliance with treatment. She is not having side effects.  Weight trend: fluctuating a bit  Wt Readings from Last 3 Encounters:  07/12/16 215 lb (97.5 kg)  12/22/15 219 lb (99.3 kg)  09/08/15 215 lb (97.5 kg)     -------------------------------------------------------------------  Follow up for H/O CVA  The patient was last seen for this 1 years ago. Changes made at last visit include none; continue medications and aggressive risk factor modification.  She reports good.compliance with treatment. She feels that condition is Unchanged. She is not having side effects.   ------------------------------------------------------------------------------------     Allergies  Allergen Reactions  . Quinapril-Hydrochlorothiazide Other (See Comments)    Other reaction(s): Headache     Current Outpatient Prescriptions:  .  aspirin 81 MG tablet, Take 1 tablet by mouth daily., Disp: , Rfl:  .  carbamazepine (TEGRETOL) 200 MG tablet, TAKE 1 TABLET BY MOUTH TWICE DAILY, Disp: 180 tablet, Rfl: 1 .  clopidogrel (PLAVIX) 75 MG tablet, Take 1 tablet (75 mg total) by mouth daily., Disp: 90 tablet, Rfl: 4 .  DILT-XR 180 MG 24 hr capsule, TAKE 2 CAPSULES(360 MG) BY MOUTH DAILY, Disp: 60 capsule, Rfl: 3 .  IRON PO, Take 1 tablet by mouth daily., Disp: , Rfl:  .  nortriptyline (PAMELOR) 10 MG capsule, 1-2 Capsule, Oral, at bedtime, Disp: 180 capsule, Rfl: 3 .  simvastatin (ZOCOR) 20 MG tablet, Take 1 tablet (20 mg total) by mouth daily., Disp: 90 tablet, Rfl: 3 .  albuterol (PROVENTIL HFA;VENTOLIN HFA) 108 (90 BASE) MCG/ACT inhaler, Inhale 2 puffs into the lungs every 6 (six) hours as needed for wheezing or shortness of breath. (Patient not taking: Reported on 07/12/2016), Disp: 1 Inhaler, Rfl: 2 .  ALPRAZolam (XANAX) 0.5 MG tablet, 1/2 to 1 tablet up to 2 times daily as needed (Patient not taking: Reported on 07/12/2016), Disp: 90 tablet, Rfl: 1 .  fluticasone (FLONASE) 50  MCG/ACT nasal spray, Place 2 sprays into both nostrils at bedtime., Disp: , Rfl:   Review of Systems  Constitutional: Positive for diaphoresis and fatigue. Negative for activity change, appetite change, chills, fever and unexpected weight  change.  Respiratory: Negative for cough, shortness of breath and wheezing.   Cardiovascular: Negative for chest pain, palpitations and leg swelling.    Social History  Substance Use Topics  . Smoking status: Never Smoker  . Smokeless tobacco: Never Used  . Alcohol use No   Objective:   BP 138/84 (BP Location: Right Arm, Patient Position: Sitting, Cuff Size: Large)   Pulse 84   Temp 98.2 F (36.8 C) (Oral)   Resp 16   Wt 215 lb (97.5 kg)   BMI 41.99 kg/m   Physical Exam  General Appearance:    Alert, cooperative, no distress, obese  Eyes:    PERRL, conjunctiva/corneas clear, EOM's intact       Lungs:     Clear to auscultation bilaterally, respirations unlabored  Heart:    Regular rate and rhythm  Neurologic:   Awake, alert, oriented x 3. No apparent focal neurological           defect.           Assessment & Plan:     1. Essential (primary) hypertension Well controlled.  Continue current medications.   - CBC - Renal function panel  2. Trigeminal neuralgia Well controlled.  Continue current medications.    3. Hyperlipidemia, unspecified hyperlipidemia type She is tolerating simvastatin well with no adverse effects.   - Lipid panel  4. History of CVA (cerebrovascular accident) Doing well on ASA and clopidegral  5. Need for influenza vaccination   6. Encounter for immunization  - Flu vaccine HIGH DOSE PF  Return in about 6 months (around 01/10/2017).     The entirety of the information documented in the History of Present Illness, Review of Systems and Physical Exam were personally obtained by me. Portions of this information were initially documented by Allene DillonEmily Drozdowski, CMA and reviewed by me for thoroughness and accuracy.    Mila Merryonald Fisher, MD  [facility]

## 2016-07-13 LAB — LIPID PANEL
CHOL/HDL RATIO: 1.6 ratio (ref 0.0–4.4)
Cholesterol, Total: 162 mg/dL (ref 100–199)
HDL: 100 mg/dL (ref >39)
LDL CALC: 52 mg/dL (ref 0–99)
TRIGLYCERIDES: 49 mg/dL (ref 0–149)
VLDL Cholesterol Cal: 10 mg/dL (ref 5–40)

## 2016-07-13 LAB — RENAL FUNCTION PANEL
Albumin: 4.2 g/dL (ref 3.6–4.8)
BUN/Creatinine Ratio: 16 (ref 12–28)
BUN: 12 mg/dL (ref 8–27)
CO2: 26 mmol/L (ref 18–29)
Calcium: 9.1 mg/dL (ref 8.7–10.3)
Chloride: 105 mmol/L (ref 96–106)
Creatinine, Ser: 0.74 mg/dL (ref 0.57–1.00)
GFR calc Af Amer: 96 mL/min/{1.73_m2} (ref >59)
GFR calc non Af Amer: 83 mL/min/{1.73_m2} (ref >59)
Glucose: 89 mg/dL (ref 65–99)
Phosphorus: 3.3 mg/dL (ref 2.5–4.5)
Potassium: 4.5 mmol/L (ref 3.5–5.2)
Sodium: 146 mmol/L — ABNORMAL HIGH (ref 134–144)

## 2016-07-13 LAB — CBC
Hematocrit: 37.7 % (ref 34.0–46.6)
Hemoglobin: 12.8 g/dL (ref 11.1–15.9)
MCH: 33.5 pg — ABNORMAL HIGH (ref 26.6–33.0)
MCHC: 34 g/dL (ref 31.5–35.7)
MCV: 99 fL — ABNORMAL HIGH (ref 79–97)
Platelets: 210 10*3/uL (ref 150–379)
RBC: 3.82 x10E6/uL (ref 3.77–5.28)
RDW: 13.7 % (ref 12.3–15.4)
WBC: 4.9 10*3/uL (ref 3.4–10.8)

## 2016-08-16 ENCOUNTER — Other Ambulatory Visit: Payer: Self-pay | Admitting: Family Medicine

## 2016-08-16 DIAGNOSIS — H40013 Open angle with borderline findings, low risk, bilateral: Secondary | ICD-10-CM | POA: Diagnosis not present

## 2016-08-16 DIAGNOSIS — H2513 Age-related nuclear cataract, bilateral: Secondary | ICD-10-CM | POA: Diagnosis not present

## 2016-08-28 ENCOUNTER — Encounter: Payer: Self-pay | Admitting: Urgent Care

## 2016-08-28 ENCOUNTER — Emergency Department
Admission: EM | Admit: 2016-08-28 | Discharge: 2016-08-29 | Disposition: A | Discharge status: Home / Self Care | Payer: Medicare Other | Attending: Emergency Medicine | Admitting: Emergency Medicine

## 2016-08-28 DIAGNOSIS — Z79899 Other long term (current) drug therapy: Secondary | ICD-10-CM | POA: Diagnosis not present

## 2016-08-28 DIAGNOSIS — Z7982 Long term (current) use of aspirin: Secondary | ICD-10-CM | POA: Insufficient documentation

## 2016-08-28 DIAGNOSIS — R1031 Right lower quadrant pain: Secondary | ICD-10-CM

## 2016-08-28 DIAGNOSIS — K5730 Diverticulosis of large intestine without perforation or abscess without bleeding: Secondary | ICD-10-CM | POA: Diagnosis not present

## 2016-08-28 DIAGNOSIS — I10 Essential (primary) hypertension: Secondary | ICD-10-CM | POA: Diagnosis not present

## 2016-08-28 LAB — COMPREHENSIVE METABOLIC PANEL
ALBUMIN: 4 g/dL (ref 3.5–5.0)
ALK PHOS: 134 U/L — AB (ref 38–126)
ALT: 26 U/L (ref 14–54)
ANION GAP: 5 (ref 5–15)
AST: 34 U/L (ref 15–41)
BUN: 18 mg/dL (ref 6–20)
CALCIUM: 8.7 mg/dL — AB (ref 8.9–10.3)
CO2: 27 mmol/L (ref 22–32)
Chloride: 106 mmol/L (ref 101–111)
Creatinine, Ser: 1.01 mg/dL — ABNORMAL HIGH (ref 0.44–1.00)
GFR calc Af Amer: >60 mL/min (ref >60)
GFR calc non Af Amer: 55 mL/min — ABNORMAL LOW (ref >60)
GLUCOSE: 136 mg/dL — AB (ref 65–99)
Potassium: 3.7 mmol/L (ref 3.5–5.1)
SODIUM: 138 mmol/L (ref 135–145)
TOTAL PROTEIN: 7.8 g/dL (ref 6.5–8.1)

## 2016-08-28 LAB — CBC
HCT: 39.3 % (ref 35.0–47.0)
HEMOGLOBIN: 13.4 g/dL (ref 12.0–16.0)
MCH: 34.6 pg — AB (ref 26.0–34.0)
MCHC: 34.2 g/dL (ref 32.0–36.0)
MCV: 101.3 fL — ABNORMAL HIGH (ref 80.0–100.0)
Platelets: 189 10*3/uL (ref 150–440)
RBC: 3.88 MIL/uL (ref 3.80–5.20)
RDW: 12.7 % (ref 11.5–14.5)
WBC: 6.6 10*3/uL (ref 3.6–11.0)

## 2016-08-28 LAB — LIPASE, BLOOD: Lipase: 34 U/L (ref 11–51)

## 2016-08-28 MED ORDER — SODIUM CHLORIDE 0.9 % IV BOLUS (SEPSIS)
500.0000 mL | Freq: Once | INTRAVENOUS | Status: AC
  Administered 2016-08-28: 500 mL via INTRAVENOUS

## 2016-08-28 MED ORDER — IOPAMIDOL (ISOVUE-300) INJECTION 61%
30.0000 mL | Freq: Once | INTRAVENOUS | Status: AC | PRN
  Administered 2016-08-28: 30 mL via ORAL

## 2016-08-28 NOTE — ED Triage Notes (Signed)
1st RN: Pt ambulatory into ED. Pt c/o burning pain in pelvis. Pt c/o urinary frequency. Pt denies DM. Pt in no acute distress at this time.

## 2016-08-28 NOTE — ED Provider Notes (Signed)
[facility] Emergency Department Provider Note   ____________________________________________   First MD Initiated Contact with Patient 08/28/16 2305     (approximate)  I have reviewed the triage vital signs and the nursing notes.   HISTORY  Chief Complaint Abdominal Pain    HPI Amy Ray is a 70 y.o. female here for evaluation of right lower abdominal pain. Pain began approximately 11 AM, reports a slow and steady worsening pain throughout the day. Not associated with a nausea or vomiting, she does reportsimilar symptoms with "diverticulitis" in the past. No loose or watery stools. No chest pain or shortness breath. No rash.  Described as a mild achy discomfort in the right lower abdomen.   Past Medical History:  Diagnosis Date  . History of diverticulitis of colon 05/20/2015   per patient report   . Hypertension   . Stroke (HCC)   . TIA (transient ischemic attack)   . Vertigo     Patient Active Problem List   Diagnosis Date Noted  . Low back pain 05/20/2015  . Fatigue 05/20/2015  . History of CVA (cerebrovascular accident) 05/20/2015  . Headache 05/20/2015  . Insomnia 05/20/2015  . Obesity 05/20/2015  . Positive H. pylori test 05/20/2015  . Tremor 05/20/2015  . Dysfunction of eustachian tube 05/20/2015  . Diverticulosis 05/20/2015  . Essential (primary) hypertension 07/17/2007  . Alterations of sensations, late effect of cerebrovascular disease 03/11/2005  . Hyperlipidemia 10/12/2003  . Trigeminal neuralgia 10/12/2003    Past Surgical History:  Procedure Laterality Date  . CHOLECYSTECTOMY  2000  . HERNIA REPAIR     umbilical hernia as a child  . REPLACEMENT TOTAL KNEE Bilateral     Prior to Admission medications   Medication Sig Start Date End Date Taking? Authorizing Provider  aspirin 81 MG tablet Take 1 tablet by mouth daily.   Yes Historical Provider, MD  carbamazepine (TEGRETOL) 200 MG tablet TAKE 1 TABLET BY MOUTH  TWICE DAILY 05/04/16  Yes Malva Limes, MD  clopidogrel (PLAVIX) 75 MG tablet Take 1 tablet (75 mg total) by mouth daily. 11/26/15  Yes Malva Limes, MD  DILT-XR 180 MG 24 hr capsule TAKE 2 CAPSULES(360 MG) BY MOUTH DAILY 03/31/16  Yes Malva Limes, MD  IRON PO Take 1 tablet by mouth daily.   Yes Historical Provider, MD  nortriptyline (PAMELOR) 10 MG capsule TAKE 1 TO 2 CAPSULES BY  MOUTH AT BEDTIME 08/16/16  Yes Malva Limes, MD  simvastatin (ZOCOR) 20 MG tablet Take 1 tablet (20 mg total) by mouth daily. 08/20/15  Yes Malva Limes, MD  amoxicillin-clavulanate (AUGMENTIN) 875-125 MG tablet Take 1 tablet by mouth 2 (two) times daily. 08/29/16   Sharyn Creamer, MD    Allergies Quinapril-hydrochlorothiazide  Family History  Problem Relation Age of Onset  . Heart attack Mother   . Hypertension Mother   . Alzheimer's disease Father   . Diabetes Father   . Hypertension Sister   . Diabetes Brother   . Hypertension Sister   . Hypertension Sister   . Hypertension Sister   . Hypertension Sister   . Breast cancer Sister 79  . Breast cancer Maternal Aunt 42    Social History Social History  Substance Use Topics  . Smoking status: Never Smoker  . Smokeless tobacco: Never Used  . Alcohol use No    Review of Systems Constitutional: No fever/chills Eyes: No visual changes. ENT: No sore throat. Cardiovascular: Denies chest pain. Respiratory: Denies shortness  of breath. Gastrointestinal: No diarrhea.  No constipation. Genitourinary: Negative for dysuria. Musculoskeletal: Negative for back pain. Skin: Negative for rash. Neurological: Negative for headaches, focal weakness or numbness.  10-point ROS otherwise negative.  ____________________________________________   PHYSICAL EXAM:  VITAL SIGNS: ED Triage Vitals  Enc Vitals Group     BP 08/28/16 2004 (!) 150/70     Pulse Rate 08/28/16 2004 96     Resp 08/28/16 2004 18     Temp 08/28/16 2004 98.6 F (37 C)     Temp  Source 08/28/16 2004 Oral     SpO2 08/28/16 2004 100 %     Weight 08/28/16 2004 210 lb (95.3 kg)     Height 08/28/16 2004 5' (1.524 m)     Head Circumference --      Peak Flow --      Pain Score 08/28/16 2005 8     Pain Loc --      Pain Edu? --      Excl. in GC? --     Constitutional: Alert and oriented. Well appearing and in no acute distress. Eyes: Conjunctivae are normal. PERRL. EOMI. Head: Atraumatic. Nose: No congestion/rhinnorhea. Mouth/Throat: Mucous membranes are moist.  Oropharynx non-erythematous. Neck: No stridor.   Cardiovascular: Normal rate, regular rhythm. Grossly normal heart sounds.  Good peripheral circulation. Respiratory: Normal respiratory effort.  No retractions. Lungs CTAB. Gastrointestinal: Soft and nontenderExcept for some mild minimal tenderness in the right lower quadrant, approximately around McBurney's point in the right mid abdomen. There is no evidence of hernia or entrapment/strangulation.. No distention. No abdominal bruits. No CVA tenderness. No rebound or guarding. Musculoskeletal: No lower extremity tenderness nor edema.  No joint effusions. Neurologic:  Normal speech and language. No gross focal neurologic deficits are appreciated.  Skin:  Skin is warm, dry and intact. No rash noted. Psychiatric: Mood and affect are normal. Speech and behavior are normal.  ____________________________________________   LABS (all labs ordered are listed, but only abnormal results are displayed)  Labs Reviewed  COMPREHENSIVE METABOLIC PANEL - Abnormal; Notable for the following:       Result Value   Glucose, Bld 136 (*)    Creatinine, Ser 1.01 (*)    Calcium 8.7 (*)    Alkaline Phosphatase 134 (*)    Total Bilirubin <0.1 (*)    GFR calc non Af Amer 55 (*)    All other components within normal limits  CBC - Abnormal; Notable for the following:    MCV 101.3 (*)    MCH 34.6 (*)    All other components within normal limits  URINALYSIS COMPLETEWITH  MICROSCOPIC ([facility] ONLY) - Abnormal; Notable for the following:    Color, Urine COLORLESS (*)    APPearance CLEAR (*)    Specific Gravity, Urine 1.004 (*)    Leukocytes, UA TRACE (*)    Bacteria, UA RARE (*)    Squamous Epithelial / LPF 0-5 (*)    All other components within normal limits  LIPASE, BLOOD   ____________________________________________  EKG   ____________________________________________  RADIOLOGY  Ct Abdomen Pelvis W Contrast  Result Date: 08/29/2016 CLINICAL DATA:  Acute onset of right lower quadrant abdominal pain and nausea. Initial encounter. EXAM: CT ABDOMEN AND PELVIS WITH CONTRAST TECHNIQUE: Multidetector CT imaging of the abdomen and pelvis was performed using the standard protocol following bolus administration of intravenous contrast. CONTRAST:  100mL ISOVUE-300 IOPAMIDOL (ISOVUE-300) INJECTION 61% COMPARISON:  CT of the abdomen and pelvis from 07/22/2015 FINDINGS: Lower chest: The visualized  lung bases are grossly clear. The visualized portions of the mediastinum are unremarkable. Hepatobiliary: The liver is unremarkable in appearance. The patient is status post cholecystectomy, with clips noted at the gallbladder fossa. The common bile duct remains normal in caliber. Pancreas: The pancreas is within normal limits. Spleen: The spleen is unremarkable in appearance. Adrenals/Urinary Tract: The adrenal glands are unremarkable in appearance. Mild left-sided renal pelvicaliectasis remains within normal limits. There is no evidence of hydronephrosis. No renal or ureteral stones are identified. No perinephric stranding is seen. Stomach/Bowel: The stomach is unremarkable in appearance. The small bowel is within normal limits. The appendix is normal in caliber, without evidence of appendicitis. Scattered diverticulosis is noted along the descending and sigmoid colon, without evidence of diverticulitis. Vascular/Lymphatic: Minimal calcification is noted at the distal abdominal  aorta. No retroperitoneal or pelvic sidewall lymphadenopathy is seen. Reproductive: The bladder is moderately distended and grossly unremarkable. The uterus demonstrates postoperative change. The ovaries are grossly symmetric. No suspicious adnexal masses are seen. Other: No additional soft tissue abnormalities are seen. Musculoskeletal: No acute osseous abnormalities are identified. There is grade 1 anterolisthesis of L4 on L5, with underlying facet disease. The visualized musculature is unremarkable in appearance. IMPRESSION: 1. No acute abnormality seen within the abdomen or pelvis. 2. Scattered diverticulosis along the descending and sigmoid colon, without evidence of diverticulitis. Electronically Signed   By: Roanna RaiderJeffery  Chang M.D.   On: 08/29/2016 00:25    ____________________________________________   PROCEDURES  Procedure(s) performed: None  Procedures  Critical Care performed: No  ____________________________________________   INITIAL IMPRESSION / ASSESSMENT AND PLAN / ED COURSE  Pertinent labs & imaging results that were available during my care of the patient were reviewed by me and considered in my medical decision making (see chart for details).  Differential diagnosis includes but is not limited to, abdominal perforation, aortic dissection, cholecystitis, appendicitis, diverticulitis, colitis, esophagitis/gastritis, kidney stone, pyelonephritis, urinary tract infection, aortic aneurysm. All are considered in decision and treatment plan. Based upon the patient's presentation and risk factors, she has mild tenderness without evidence of an acute abdomen or peritonitis but focally in the right lower ointment. For this reason CT will be ordered to evaluate for etiology, including exclude appendicitis but also consideration for diverticulitis is high, possibly mild early infection. ----------------------------------------- 2:40 AM on  08/29/2016 -----------------------------------------   Patient reports feeling well. Does not wish for anything for nausea or vomiting. Reports she feels comfortable, given the focality of symptomatology and her history of diverticulitis who placed her on Augmentin for what may be mild, non-radiographically apparent diverticulitis at this time. Careful discharge instructions and return precautions advised patient and family who is with her. Patient agreeable.  Return precautions and treatment recommendations and follow-up discussed with the patient who is agreeable with the plan.    Clinical Course      ____________________________________________   FINAL CLINICAL IMPRESSION(S) / ED DIAGNOSES  Final diagnoses:  Right lower quadrant abdominal pain  Diverticulosis of large intestine without hemorrhage      NEW MEDICATIONS STARTED DURING THIS VISIT:  New Prescriptions   AMOXICILLIN-CLAVULANATE (AUGMENTIN) 875-125 MG TABLET    Take 1 tablet by mouth 2 (two) times daily.     Note:  This document was prepared using Dragon voice recognition software and may include unintentional dictation errors.     Sharyn CreamerMark Trashawn Oquendo, MD 08/29/16 [phone]

## 2016-08-28 NOTE — ED Triage Notes (Signed)
Patient presents with c/o RLQ abdominal pain that started this morning. (+) nausea. Denies fever and urinary symptoms. LNBM was today. Patient reports that pain is "better" when she sits still, however markedly increases when supine.

## 2016-08-29 ENCOUNTER — Encounter: Payer: Self-pay | Admitting: Radiology

## 2016-08-29 ENCOUNTER — Emergency Department: Payer: Medicare Other

## 2016-08-29 DIAGNOSIS — K5730 Diverticulosis of large intestine without perforation or abscess without bleeding: Secondary | ICD-10-CM | POA: Diagnosis not present

## 2016-08-29 LAB — URINALYSIS COMPLETE WITH MICROSCOPIC (ARMC ONLY)
BILIRUBIN URINE: NEGATIVE
GLUCOSE, UA: NEGATIVE mg/dL
Hgb urine dipstick: NEGATIVE
Ketones, ur: NEGATIVE mg/dL
Nitrite: NEGATIVE
PH: 6 (ref 5.0–8.0)
Protein, ur: NEGATIVE mg/dL
RBC / HPF: NONE SEEN RBC/hpf (ref 0–5)
Specific Gravity, Urine: 1.004 — ABNORMAL LOW (ref 1.005–1.030)

## 2016-08-29 MED ORDER — IOPAMIDOL (ISOVUE-300) INJECTION 61%
100.0000 mL | Freq: Once | INTRAVENOUS | Status: AC | PRN
Start: 2016-08-29 — End: 2016-08-29
  Administered 2016-08-29: 100 mL via INTRAVENOUS

## 2016-08-29 MED ORDER — AMOXICILLIN-POT CLAVULANATE 875-125 MG PO TABS: 1.0000 | ORAL_TABLET | Freq: Two times a day (BID) | ORAL | 0 refills | Status: DC

## 2016-08-29 MED ORDER — AMOXICILLIN-POT CLAVULANATE 875-125 MG PO TABS
1.0000 | ORAL_TABLET | Freq: Once | ORAL | Status: AC
  Administered 2016-08-29: 1 via ORAL
  Filled 2016-08-29: qty 1

## 2016-08-29 NOTE — Discharge Instructions (Signed)
We believe your symptoms are possibly caused by early symptoms of diverticulitis.  Most of the time this condition (please read through the included information) can be cured with outpatient antibiotics.  Please take the full course of prescribed medication(s) and follow up with the doctors recommended above.  Return to the ED if your abdominal pain worsens or fails to improve, you develop bloody vomiting, bloody diarrhea, you are unable to tolerate fluids due to vomiting, fever greater than 101, or other symptoms that concern you.

## 2016-08-29 NOTE — ED Notes (Signed)
Pt returned from CT °

## 2016-09-03 ENCOUNTER — Other Ambulatory Visit: Payer: Self-pay | Admitting: Family Medicine

## 2016-09-06 ENCOUNTER — Other Ambulatory Visit: Payer: Self-pay | Admitting: Family Medicine

## 2016-09-06 MED ORDER — ALPRAZOLAM 0.5 MG PO TABS: ORAL_TABLET | ORAL | 1 refills | Status: DC

## 2016-09-06 NOTE — Progress Notes (Signed)
Please send prescription to [facility].

## 2016-11-02 ENCOUNTER — Other Ambulatory Visit: Payer: Self-pay | Admitting: Family Medicine

## 2016-11-22 DIAGNOSIS — M19011 Primary osteoarthritis, right shoulder: Secondary | ICD-10-CM | POA: Diagnosis not present

## 2016-11-22 DIAGNOSIS — M19012 Primary osteoarthritis, left shoulder: Secondary | ICD-10-CM | POA: Diagnosis not present

## 2016-11-22 DIAGNOSIS — Z8673 Personal history of transient ischemic attack (TIA), and cerebral infarction without residual deficits: Secondary | ICD-10-CM | POA: Diagnosis not present

## 2016-12-13 DIAGNOSIS — M19012 Primary osteoarthritis, left shoulder: Secondary | ICD-10-CM | POA: Diagnosis not present

## 2016-12-13 DIAGNOSIS — M19011 Primary osteoarthritis, right shoulder: Secondary | ICD-10-CM | POA: Diagnosis not present

## 2016-12-13 DIAGNOSIS — M75122 Complete rotator cuff tear or rupture of left shoulder, not specified as traumatic: Secondary | ICD-10-CM | POA: Diagnosis not present

## 2017-01-01 ENCOUNTER — Other Ambulatory Visit: Payer: Self-pay | Admitting: Family Medicine

## 2017-01-17 ENCOUNTER — Ambulatory Visit (INDEPENDENT_AMBULATORY_CARE_PROVIDER_SITE_OTHER): Payer: Medicare Other | Admitting: Family Medicine

## 2017-01-17 ENCOUNTER — Encounter: Payer: Self-pay | Admitting: Family Medicine

## 2017-01-17 VITALS — BP 140/88 | HR 71 | Temp 98.2°F | Resp 16 | Wt 215.0 lb

## 2017-01-17 DIAGNOSIS — I10 Essential (primary) hypertension: Secondary | ICD-10-CM | POA: Diagnosis not present

## 2017-01-17 DIAGNOSIS — Z8673 Personal history of transient ischemic attack (TIA), and cerebral infarction without residual deficits: Secondary | ICD-10-CM | POA: Diagnosis not present

## 2017-01-17 DIAGNOSIS — G500 Trigeminal neuralgia: Secondary | ICD-10-CM | POA: Diagnosis not present

## 2017-01-17 DIAGNOSIS — Z01818 Encounter for other preprocedural examination: Secondary | ICD-10-CM

## 2017-01-17 NOTE — Progress Notes (Signed)
Patient: Amy Ray Female    DOB: 1946-10-09   71 y.o.   MRN: [id number] Visit Date: 01/17/2017  Today's Provider: Mila Merry, MD   Chief Complaint  Patient presents with  . Medical Clearance    surgical clearance  . Hypertension    follow up  . Hyperlipidemia    follow up   Subjective:    HPI Surgical Clearance: Patient is scheduled to Left total shoulder replacement on 02/04/2017. The surgeon will be Dr. Vale Haven. She does have history of chronic small vessel cerebral ischemia. She had transient neurological deficit in 2006 including left arm numbness and balance disturbance. She was told she had a small stroke at the time, but she had extensive neuroimaging showing only chronic ischemic changes as above, but not infarction. Carotid u/s was normal at that time. She has been on ASA and clopidogrel  She also has long history of trigeminal neuralgia well controlled for many years on combination of nortriptyline and carbamazepine.   She has no history of surgical or anesthetic complications.   Results for orders placed or performed during the hospital encounter of 08/28/16  Lipase, blood  Result Value Ref Range   Lipase 34 11 - 51 U/L  Comprehensive metabolic panel  Result Value Ref Range   Sodium 138 135 - 145 mmol/L   Potassium 3.7 3.5 - 5.1 mmol/L   Chloride 106 101 - 111 mmol/L   CO2 27 22 - 32 mmol/L   Glucose, Bld 136 (H) 65 - 99 mg/dL   BUN 18 6 - 20 mg/dL   Creatinine, Ser 4.69 (H) 0.44 - 1.00 mg/dL   Calcium 8.7 (L) 8.9 - 10.3 mg/dL   Total Protein 7.8 6.5 - 8.1 g/dL   Albumin 4.0 3.5 - 5.0 g/dL   AST 34 15 - 41 U/L   ALT 26 14 - 54 U/L   Alkaline Phosphatase 134 (H) 38 - 126 U/L   Total Bilirubin <0.1 (L) 0.3 - 1.2 mg/dL   GFR calc non Af Amer 55 (L) >60 mL/min   GFR calc Af Amer >60 >60 mL/min   Anion gap 5 5 - 15  CBC  Result Value Ref Range   WBC 6.6 3.6 - 11.0 K/uL   RBC 3.88 3.80 - 5.20 MIL/uL   Hemoglobin 13.4 12.0 - 16.0 g/dL   HCT 62.9 52.8 - 41.3 %   MCV 101.3 (H) 80.0 - 100.0 fL   MCH 34.6 (H) 26.0 - 34.0 pg   MCHC 34.2 32.0 - 36.0 g/dL   RDW 24.4 01.0 - 27.2 %   Platelets 189 150 - 440 K/uL  Urinalysis complete, with microscopic ([facility] only)  Result Value Ref Range   Color, Urine COLORLESS (A) YELLOW   APPearance CLEAR (A) CLEAR   Glucose, UA NEGATIVE NEGATIVE mg/dL   Bilirubin Urine NEGATIVE NEGATIVE   Ketones, ur NEGATIVE NEGATIVE mg/dL   Specific Gravity, Urine 1.004 (L) 1.005 - 1.030   Hgb urine dipstick NEGATIVE NEGATIVE   pH 6.0 5.0 - 8.0   Protein, ur NEGATIVE NEGATIVE mg/dL   Nitrite NEGATIVE NEGATIVE   Leukocytes, UA TRACE (A) NEGATIVE   RBC / HPF NONE SEEN 0 - 5 RBC/hpf   WBC, UA 0-5 0 - 5 WBC/hpf   Bacteria, UA RARE (A) NONE SEEN   Squamous Epithelial / LPF 0-5 (A) NONE SEEN   Mucous PRESENT      Past Surgical History:  Procedure Laterality Date  . CHOLECYSTECTOMY  2000  . HERNIA REPAIR     umbilical hernia as a child  . REPLACEMENT TOTAL KNEE Bilateral        Hypertension, follow-up:  BP Readings from Last 3 Encounters:  08/29/16 128/78  07/12/16 138/84  12/22/15 130/88    She was last seen for hypertension 6 months ago.  BP at that visit was 138/84. Management since that visit includes no changes. She reports good compliance with treatment. She is not having side effects.  She is exercising. She is adherent to low salt diet.   Outside blood pressures are not checked regularly. She is experiencing palpitations.  Patient denies chest pain, chest pressure/discomfort, claudication, dyspnea, exertional chest pressure/discomfort, fatigue, irregular heart beat, lower extremity edema, near-syncope, orthopnea, paroxysmal nocturnal dyspnea, syncope and tachypnea.   Cardiovascular risk factors include advanced age (older than 63 for men, 47 for women) and hypertension.  Use of agents associated with hypertension: NSAIDS.     Weight trend: increasing steadily Wt Readings from  Last 3 Encounters:  08/28/16 210 lb (95.3 kg)  07/12/16 215 lb (97.5 kg)  12/22/15 219 lb (99.3 kg)    Current diet: well balanced  ------------------------------------------------------------------------  Lipid/Cholesterol, Follow-up:   Last seen for this6 months ago.  Management changes since that visit include none. . Last Lipid Panel:    Component Value Date/Time   CHOL 162 07/12/2016 1010   TRIG 49 07/12/2016 1010   HDL 100 07/12/2016 1010   CHOLHDL 1.6 07/12/2016 1010   LDLCALC 52 07/12/2016 1010    Risk factors for vascular disease include hypertension  She reports good compliance with treatment. She is not having side effects.  Current symptoms include none and have been stable. Weight trend: increasing steadily Prior visit with dietician: no Current diet: well balanced Current exercise: bicycling  Wt Readings from Last 3 Encounters:  08/28/16 210 lb (95.3 kg)  07/12/16 215 lb (97.5 kg)  12/22/15 219 lb (99.3 kg)    -------------------------------------------------------------------     Allergies  Allergen Reactions  . Quinapril-Hydrochlorothiazide Other (See Comments)    Other reaction(s): Headache     Current Outpatient Prescriptions:  .  ALPRAZolam (XANAX) 0.5 MG tablet, 1/2 to 1 tablet up to 2 times daily as needed, Disp: 90 tablet, Rfl: 1 .  aspirin 81 MG tablet, Take 1 tablet by mouth daily., Disp: , Rfl:  .  carbamazepine (TEGRETOL) 200 MG tablet, TAKE 1 TABLET BY MOUTH TWICE DAILY, Disp: 180 tablet, Rfl: 3 .  clopidogrel (PLAVIX) 75 MG tablet, TAKE 1 TABLET(75 MG) BY MOUTH DAILY, Disp: 90 tablet, Rfl: 4 .  DILT-XR 180 MG 24 hr capsule, TAKE 2 CAPSULES(360 MG) BY MOUTH DAILY, Disp: 60 capsule, Rfl: 6 .  IRON PO, Take 1 tablet by mouth daily., Disp: , Rfl:  .  nortriptyline (PAMELOR) 10 MG capsule, TAKE 1 TO 2 CAPSULES BY  MOUTH AT BEDTIME, Disp: 180 capsule, Rfl: 4 .  simvastatin (ZOCOR) 20 MG tablet, TAKE 1 TABLET BY MOUTH  DAILY, Disp: 90  tablet, Rfl: 4  Review of Systems  Constitutional: Positive for diaphoresis (in the mornings). Negative for appetite change, chills, fatigue and fever.  Respiratory: Negative for chest tightness and shortness of breath.   Cardiovascular: Negative for chest pain and palpitations.  Gastrointestinal: Negative for abdominal pain, nausea and vomiting.  Endocrine: Positive for cold intolerance (feet stay cold).  Musculoskeletal: Positive for arthralgias (left shoulder) and joint swelling (left shoulder).  Neurological: Positive for light-headedness (off balance). Negative for dizziness and weakness.  Social History  Substance Use Topics  . Smoking status: Never Smoker  . Smokeless tobacco: Never Used  . Alcohol use No   Objective:   BP 140/88 (BP Location: Left Arm, Patient Position: Sitting, Cuff Size: Large)   Pulse 71   Temp 98.2 F (36.8 C) (Oral)   Resp 16   Wt 215 lb (97.5 kg)   SpO2 99% Comment: room air  BMI 41.99 kg/m  There were no vitals filed for this visit.   Physical Exam   General Appearance:    Alert, cooperative, no distress  Eyes:    PERRL, conjunctiva/corneas clear, EOM's intact       Lungs:     Clear to auscultation bilaterally, respirations unlabored  Heart:    Regular rate and rhythm  Neurologic:   Awake, alert, oriented x 3. No apparent focal neurological           defect.       EKG: NSR    Assessment & Plan:     1. Pre-op evaluation She does have multiple cardiac risk factors including hypertension, hyperlipidemia, and cerebral vascular disease. She has no known NON-cardiac risk factors and is low risk for NON-cardiac complications. Will need cardiology pre-op cardiology evaluation.   2. Essential (primary) hypertension Well controlled.  Continue current medications.   - EKG 12-Lead - Ambulatory referral to Cardiology  3. History of CVA (cerebrovascular accident) No permanent sequela.  Compliant with medication.  Continue aggressive risk  factor modification.  Continue ASA and clopidogrel for now. May d/c 5 days before surgery unless cardiology feels otherwise.  - Ambulatory referral to Cardiology  4. Trigeminal neuralgia Well controlled on current medication regiment.        Mila Merry, MD  [facility]

## 2017-01-19 DIAGNOSIS — R011 Cardiac murmur, unspecified: Secondary | ICD-10-CM | POA: Diagnosis not present

## 2017-01-19 DIAGNOSIS — M170 Bilateral primary osteoarthritis of knee: Secondary | ICD-10-CM | POA: Diagnosis not present

## 2017-01-19 DIAGNOSIS — Z8673 Personal history of transient ischemic attack (TIA), and cerebral infarction without residual deficits: Secondary | ICD-10-CM | POA: Diagnosis not present

## 2017-01-19 DIAGNOSIS — I10 Essential (primary) hypertension: Secondary | ICD-10-CM | POA: Diagnosis not present

## 2017-01-19 DIAGNOSIS — Z01818 Encounter for other preprocedural examination: Secondary | ICD-10-CM | POA: Diagnosis not present

## 2017-01-19 DIAGNOSIS — E669 Obesity, unspecified: Secondary | ICD-10-CM | POA: Diagnosis not present

## 2017-01-19 DIAGNOSIS — I208 Other forms of angina pectoris: Secondary | ICD-10-CM | POA: Diagnosis not present

## 2017-01-19 DIAGNOSIS — M1990 Unspecified osteoarthritis, unspecified site: Secondary | ICD-10-CM | POA: Diagnosis not present

## 2017-01-19 DIAGNOSIS — I639 Cerebral infarction, unspecified: Secondary | ICD-10-CM | POA: Diagnosis not present

## 2017-01-19 DIAGNOSIS — E782 Mixed hyperlipidemia: Secondary | ICD-10-CM | POA: Diagnosis not present

## 2017-01-19 DIAGNOSIS — Z6841 Body Mass Index (BMI) 40.0 and over, adult: Secondary | ICD-10-CM | POA: Diagnosis not present

## 2017-02-01 DIAGNOSIS — Z8673 Personal history of transient ischemic attack (TIA), and cerebral infarction without residual deficits: Secondary | ICD-10-CM | POA: Diagnosis not present

## 2017-02-01 DIAGNOSIS — I208 Other forms of angina pectoris: Secondary | ICD-10-CM | POA: Diagnosis not present

## 2017-02-01 DIAGNOSIS — Z01818 Encounter for other preprocedural examination: Secondary | ICD-10-CM | POA: Diagnosis not present

## 2017-02-04 DIAGNOSIS — Z8673 Personal history of transient ischemic attack (TIA), and cerebral infarction without residual deficits: Secondary | ICD-10-CM | POA: Diagnosis not present

## 2017-02-04 DIAGNOSIS — D649 Anemia, unspecified: Secondary | ICD-10-CM | POA: Diagnosis present

## 2017-02-04 DIAGNOSIS — Z7902 Long term (current) use of antithrombotics/antiplatelets: Secondary | ICD-10-CM | POA: Diagnosis not present

## 2017-02-04 DIAGNOSIS — M19012 Primary osteoarthritis, left shoulder: Secondary | ICD-10-CM | POA: Diagnosis not present

## 2017-02-04 DIAGNOSIS — I10 Essential (primary) hypertension: Secondary | ICD-10-CM | POA: Diagnosis not present

## 2017-02-04 DIAGNOSIS — M7521 Bicipital tendinitis, right shoulder: Secondary | ICD-10-CM | POA: Diagnosis not present

## 2017-02-04 DIAGNOSIS — M19011 Primary osteoarthritis, right shoulder: Secondary | ICD-10-CM | POA: Diagnosis not present

## 2017-02-04 DIAGNOSIS — M25512 Pain in left shoulder: Secondary | ICD-10-CM | POA: Diagnosis not present

## 2017-02-04 DIAGNOSIS — R29818 Other symptoms and signs involving the nervous system: Secondary | ICD-10-CM | POA: Diagnosis present

## 2017-02-04 DIAGNOSIS — M25552 Pain in left hip: Secondary | ICD-10-CM | POA: Diagnosis not present

## 2017-02-04 DIAGNOSIS — M7522 Bicipital tendinitis, left shoulder: Secondary | ICD-10-CM | POA: Diagnosis not present

## 2017-02-04 DIAGNOSIS — E785 Hyperlipidemia, unspecified: Secondary | ICD-10-CM | POA: Diagnosis not present

## 2017-02-04 DIAGNOSIS — E7800 Pure hypercholesterolemia, unspecified: Secondary | ICD-10-CM | POA: Diagnosis present

## 2017-02-04 DIAGNOSIS — G8929 Other chronic pain: Secondary | ICD-10-CM | POA: Diagnosis not present

## 2017-02-04 DIAGNOSIS — K219 Gastro-esophageal reflux disease without esophagitis: Secondary | ICD-10-CM | POA: Diagnosis present

## 2017-02-04 DIAGNOSIS — G8918 Other acute postprocedural pain: Secondary | ICD-10-CM | POA: Diagnosis not present

## 2017-02-04 DIAGNOSIS — Z7982 Long term (current) use of aspirin: Secondary | ICD-10-CM | POA: Diagnosis not present

## 2017-02-07 ENCOUNTER — Other Ambulatory Visit: Payer: Self-pay | Admitting: Family Medicine

## 2017-02-07 MED ORDER — CARBAMAZEPINE 200 MG PO TABS: 200.0000 mg | ORAL_TABLET | Freq: Two times a day (BID) | ORAL | 3 refills | Status: DC

## 2017-02-07 NOTE — Telephone Encounter (Signed)
[facility] faxed a request on the following medication.  Thanks CC   carbamazepine (TEGRETOL) 200 MG tablet

## 2017-02-14 DIAGNOSIS — M25512 Pain in left shoulder: Secondary | ICD-10-CM | POA: Diagnosis not present

## 2017-02-14 DIAGNOSIS — M19012 Primary osteoarthritis, left shoulder: Secondary | ICD-10-CM | POA: Diagnosis not present

## 2017-02-25 DIAGNOSIS — Z7982 Long term (current) use of aspirin: Secondary | ICD-10-CM | POA: Diagnosis not present

## 2017-02-25 DIAGNOSIS — Z7901 Long term (current) use of anticoagulants: Secondary | ICD-10-CM | POA: Diagnosis not present

## 2017-02-25 DIAGNOSIS — I10 Essential (primary) hypertension: Secondary | ICD-10-CM | POA: Diagnosis not present

## 2017-02-25 DIAGNOSIS — Z9181 History of falling: Secondary | ICD-10-CM | POA: Diagnosis not present

## 2017-02-25 DIAGNOSIS — Z96612 Presence of left artificial shoulder joint: Secondary | ICD-10-CM | POA: Diagnosis not present

## 2017-02-25 DIAGNOSIS — Z8673 Personal history of transient ischemic attack (TIA), and cerebral infarction without residual deficits: Secondary | ICD-10-CM | POA: Diagnosis not present

## 2017-02-25 DIAGNOSIS — Z471 Aftercare following joint replacement surgery: Secondary | ICD-10-CM | POA: Diagnosis not present

## 2017-03-01 DIAGNOSIS — I10 Essential (primary) hypertension: Secondary | ICD-10-CM | POA: Diagnosis not present

## 2017-03-01 DIAGNOSIS — Z7901 Long term (current) use of anticoagulants: Secondary | ICD-10-CM | POA: Diagnosis not present

## 2017-03-01 DIAGNOSIS — Z471 Aftercare following joint replacement surgery: Secondary | ICD-10-CM | POA: Diagnosis not present

## 2017-03-01 DIAGNOSIS — Z8673 Personal history of transient ischemic attack (TIA), and cerebral infarction without residual deficits: Secondary | ICD-10-CM | POA: Diagnosis not present

## 2017-03-01 DIAGNOSIS — Z96612 Presence of left artificial shoulder joint: Secondary | ICD-10-CM | POA: Diagnosis not present

## 2017-03-01 DIAGNOSIS — Z7982 Long term (current) use of aspirin: Secondary | ICD-10-CM | POA: Diagnosis not present

## 2017-03-05 DIAGNOSIS — Z96612 Presence of left artificial shoulder joint: Secondary | ICD-10-CM | POA: Diagnosis not present

## 2017-03-05 DIAGNOSIS — Z7982 Long term (current) use of aspirin: Secondary | ICD-10-CM | POA: Diagnosis not present

## 2017-03-05 DIAGNOSIS — I10 Essential (primary) hypertension: Secondary | ICD-10-CM | POA: Diagnosis not present

## 2017-03-05 DIAGNOSIS — Z7901 Long term (current) use of anticoagulants: Secondary | ICD-10-CM | POA: Diagnosis not present

## 2017-03-05 DIAGNOSIS — Z471 Aftercare following joint replacement surgery: Secondary | ICD-10-CM | POA: Diagnosis not present

## 2017-03-05 DIAGNOSIS — Z8673 Personal history of transient ischemic attack (TIA), and cerebral infarction without residual deficits: Secondary | ICD-10-CM | POA: Diagnosis not present

## 2017-03-08 DIAGNOSIS — I10 Essential (primary) hypertension: Secondary | ICD-10-CM | POA: Diagnosis not present

## 2017-03-08 DIAGNOSIS — Z7901 Long term (current) use of anticoagulants: Secondary | ICD-10-CM | POA: Diagnosis not present

## 2017-03-08 DIAGNOSIS — Z8673 Personal history of transient ischemic attack (TIA), and cerebral infarction without residual deficits: Secondary | ICD-10-CM | POA: Diagnosis not present

## 2017-03-08 DIAGNOSIS — Z7982 Long term (current) use of aspirin: Secondary | ICD-10-CM | POA: Diagnosis not present

## 2017-03-08 DIAGNOSIS — Z471 Aftercare following joint replacement surgery: Secondary | ICD-10-CM | POA: Diagnosis not present

## 2017-03-08 DIAGNOSIS — Z96612 Presence of left artificial shoulder joint: Secondary | ICD-10-CM | POA: Diagnosis not present

## 2017-03-10 DIAGNOSIS — Z8673 Personal history of transient ischemic attack (TIA), and cerebral infarction without residual deficits: Secondary | ICD-10-CM | POA: Diagnosis not present

## 2017-03-10 DIAGNOSIS — Z7982 Long term (current) use of aspirin: Secondary | ICD-10-CM | POA: Diagnosis not present

## 2017-03-10 DIAGNOSIS — Z471 Aftercare following joint replacement surgery: Secondary | ICD-10-CM | POA: Diagnosis not present

## 2017-03-10 DIAGNOSIS — Z7901 Long term (current) use of anticoagulants: Secondary | ICD-10-CM | POA: Diagnosis not present

## 2017-03-10 DIAGNOSIS — I10 Essential (primary) hypertension: Secondary | ICD-10-CM | POA: Diagnosis not present

## 2017-03-10 DIAGNOSIS — Z96612 Presence of left artificial shoulder joint: Secondary | ICD-10-CM | POA: Diagnosis not present

## 2017-03-14 DIAGNOSIS — Z96612 Presence of left artificial shoulder joint: Secondary | ICD-10-CM | POA: Diagnosis not present

## 2017-03-14 DIAGNOSIS — Z7901 Long term (current) use of anticoagulants: Secondary | ICD-10-CM | POA: Diagnosis not present

## 2017-03-14 DIAGNOSIS — I10 Essential (primary) hypertension: Secondary | ICD-10-CM | POA: Diagnosis not present

## 2017-03-14 DIAGNOSIS — Z7982 Long term (current) use of aspirin: Secondary | ICD-10-CM | POA: Diagnosis not present

## 2017-03-14 DIAGNOSIS — Z471 Aftercare following joint replacement surgery: Secondary | ICD-10-CM | POA: Diagnosis not present

## 2017-03-14 DIAGNOSIS — Z8673 Personal history of transient ischemic attack (TIA), and cerebral infarction without residual deficits: Secondary | ICD-10-CM | POA: Diagnosis not present

## 2017-03-16 DIAGNOSIS — Z7982 Long term (current) use of aspirin: Secondary | ICD-10-CM | POA: Diagnosis not present

## 2017-03-16 DIAGNOSIS — Z7901 Long term (current) use of anticoagulants: Secondary | ICD-10-CM | POA: Diagnosis not present

## 2017-03-16 DIAGNOSIS — Z8673 Personal history of transient ischemic attack (TIA), and cerebral infarction without residual deficits: Secondary | ICD-10-CM | POA: Diagnosis not present

## 2017-03-16 DIAGNOSIS — I10 Essential (primary) hypertension: Secondary | ICD-10-CM | POA: Diagnosis not present

## 2017-03-16 DIAGNOSIS — Z96612 Presence of left artificial shoulder joint: Secondary | ICD-10-CM | POA: Diagnosis not present

## 2017-03-16 DIAGNOSIS — Z471 Aftercare following joint replacement surgery: Secondary | ICD-10-CM | POA: Diagnosis not present

## 2017-03-22 DIAGNOSIS — I10 Essential (primary) hypertension: Secondary | ICD-10-CM | POA: Diagnosis not present

## 2017-03-22 DIAGNOSIS — Z7982 Long term (current) use of aspirin: Secondary | ICD-10-CM | POA: Diagnosis not present

## 2017-03-22 DIAGNOSIS — Z471 Aftercare following joint replacement surgery: Secondary | ICD-10-CM | POA: Diagnosis not present

## 2017-03-22 DIAGNOSIS — Z7901 Long term (current) use of anticoagulants: Secondary | ICD-10-CM | POA: Diagnosis not present

## 2017-03-22 DIAGNOSIS — Z96612 Presence of left artificial shoulder joint: Secondary | ICD-10-CM | POA: Diagnosis not present

## 2017-03-22 DIAGNOSIS — Z8673 Personal history of transient ischemic attack (TIA), and cerebral infarction without residual deficits: Secondary | ICD-10-CM | POA: Diagnosis not present

## 2017-03-25 ENCOUNTER — Other Ambulatory Visit: Payer: Self-pay | Admitting: Family Medicine

## 2017-03-25 DIAGNOSIS — Z7901 Long term (current) use of anticoagulants: Secondary | ICD-10-CM | POA: Diagnosis not present

## 2017-03-25 DIAGNOSIS — Z96612 Presence of left artificial shoulder joint: Secondary | ICD-10-CM | POA: Diagnosis not present

## 2017-03-25 DIAGNOSIS — I10 Essential (primary) hypertension: Secondary | ICD-10-CM | POA: Diagnosis not present

## 2017-03-25 DIAGNOSIS — Z8673 Personal history of transient ischemic attack (TIA), and cerebral infarction without residual deficits: Secondary | ICD-10-CM | POA: Diagnosis not present

## 2017-03-25 DIAGNOSIS — Z7982 Long term (current) use of aspirin: Secondary | ICD-10-CM | POA: Diagnosis not present

## 2017-03-25 DIAGNOSIS — Z1231 Encounter for screening mammogram for malignant neoplasm of breast: Secondary | ICD-10-CM

## 2017-03-25 DIAGNOSIS — Z471 Aftercare following joint replacement surgery: Secondary | ICD-10-CM | POA: Diagnosis not present

## 2017-03-28 DIAGNOSIS — M25512 Pain in left shoulder: Secondary | ICD-10-CM | POA: Diagnosis not present

## 2017-04-11 ENCOUNTER — Other Ambulatory Visit: Payer: Self-pay | Admitting: Family Medicine

## 2017-04-19 ENCOUNTER — Other Ambulatory Visit: Payer: Self-pay | Admitting: Family Medicine

## 2017-04-19 MED ORDER — ALPRAZOLAM 0.5 MG PO TABS: ORAL_TABLET | ORAL | 2 refills | Status: DC

## 2017-04-19 NOTE — Telephone Encounter (Signed)
Please call in alprazolam.  

## 2017-04-19 NOTE — Telephone Encounter (Signed)
[facility] faxed a request on the following medication. Thanks CC ° °ALPRAZolam (XANAX) 0.5 MG tablet  ° °

## 2017-04-19 NOTE — Telephone Encounter (Signed)
Called in Rx as below.  

## 2017-04-19 NOTE — Telephone Encounter (Signed)
Please review. Thanks!  

## 2017-05-02 ENCOUNTER — Ambulatory Visit
Admission: RE | Admit: 2017-05-02 | Discharge: 2017-05-02 | Disposition: A | Discharge status: Home / Self Care | Payer: Medicare Other | Source: Ambulatory Visit | Attending: Family Medicine | Admitting: Family Medicine

## 2017-05-02 DIAGNOSIS — Z1231 Encounter for screening mammogram for malignant neoplasm of breast: Secondary | ICD-10-CM

## 2017-05-30 ENCOUNTER — Encounter: Payer: Self-pay | Admitting: Family Medicine

## 2017-05-30 ENCOUNTER — Ambulatory Visit (INDEPENDENT_AMBULATORY_CARE_PROVIDER_SITE_OTHER): Payer: Medicare Other | Admitting: Family Medicine

## 2017-05-30 VITALS — BP 114/84 | HR 86 | Temp 98.5°F | Resp 16 | Wt 207.4 lb

## 2017-05-30 DIAGNOSIS — J019 Acute sinusitis, unspecified: Secondary | ICD-10-CM | POA: Diagnosis not present

## 2017-05-30 MED ORDER — AMOXICILLIN-POT CLAVULANATE 875-125 MG PO TABS: 1.0000 | ORAL_TABLET | Freq: Two times a day (BID) | ORAL | 0 refills | Status: DC

## 2017-05-30 NOTE — Progress Notes (Signed)
Subjective:     Patient ID: Aamna Kolar, female   DOB: [birth year], 71 y.o.   MRN: [id number]  HPI  Chief Complaint  Patient presents with  . Cough    Patient comes in office today with complaints of cough and congestion for over the past 2 weeks. Patient reports that cough has been productive of yellow/green bloody phlegm Patient reports that she has had laryngitis and congestion in chest, she has been taking otc [place] Hbp  Patient reports increased  purulent sinus drainage, post nasal drainage and accompanying cough   Review of Systems     Objective:   Physical Exam  Constitutional: She appears well-developed and well-nourished.  Ears: T.M's intact without inflammation Sinuses: non-tender Throat: no tonsillar enlargement or exudate Neck: no cervical adenopathy Lungs: clear     Assessment:    1. Acute non-recurrent sinusitis, unspecified location - amoxicillin-clavulanate (AUGMENTIN) 875-125 MG tablet; Take 1 tablet by mouth 2 (two) times daily.  Dispense: 20 tablet; Refill: 0    Plan:    Discussed use of Mucinex D and Delsym.

## 2017-05-30 NOTE — Patient Instructions (Addendum)
Discussed use of Delsym for cough and Mucinex D for congestion.

## 2017-06-07 ENCOUNTER — Ambulatory Visit: Payer: Medicare Other

## 2017-06-16 ENCOUNTER — Ambulatory Visit (INDEPENDENT_AMBULATORY_CARE_PROVIDER_SITE_OTHER): Payer: Medicare Other

## 2017-06-16 VITALS — BP 144/82 | HR 96 | Temp 98.9°F | Ht 60.0 in | Wt 206.2 lb

## 2017-06-16 DIAGNOSIS — Z23 Encounter for immunization: Secondary | ICD-10-CM | POA: Diagnosis not present

## 2017-06-16 DIAGNOSIS — Z0000 Encounter for general adult medical examination without abnormal findings: Secondary | ICD-10-CM | POA: Diagnosis not present

## 2017-06-16 NOTE — Progress Notes (Signed)
Subjective:   Amy Ray is a 71 y.o. female who presents for an Initial Medicare Annual Wellness Visit.  Review of Systems    N/A  Cardiac Risk Factors include: advanced age (>22men, >44 women);obesity (BMI >30kg/m2);dyslipidemia     Objective:    Today's Vitals   06/16/17 1038  BP: (!) 144/82  Pulse: 96  Temp: 98.9 F (37.2 C)  TempSrc: Oral  Weight: 206 lb 3.2 oz (93.5 kg)  Height: 5' (1.524 m)  PainSc: 0-No pain   Body mass index is 40.27 kg/m.   Current Medications (verified) Outpatient Encounter Prescriptions as of 06/16/2017  Medication Sig  . aspirin 81 MG tablet Take 1 tablet by mouth daily.  . carbamazepine (TEGRETOL) 200 MG tablet Take 1 tablet (200 mg total) by mouth 2 (two) times daily.  . clopidogrel (PLAVIX) 75 MG tablet TAKE 1 TABLET(75 MG) BY MOUTH DAILY  . DILT-XR 180 MG 24 hr capsule TAKE 2 CAPSULES(360 MG) BY MOUTH DAILY  . IRON PO Take 1 tablet by mouth daily.  . nortriptyline (PAMELOR) 10 MG capsule TAKE 1 TO 2 CAPSULES BY  MOUTH AT BEDTIME  . simvastatin (ZOCOR) 20 MG tablet TAKE 1 TABLET BY MOUTH  DAILY  . [DISCONTINUED] ALPRAZolam (XANAX) 0.5 MG tablet   . [DISCONTINUED] amoxicillin-clavulanate (AUGMENTIN) 875-125 MG tablet Take 1 tablet by mouth 2 (two) times daily.   No facility-administered encounter medications on file as of 06/16/2017.     Allergies (verified) Quinapril-hydrochlorothiazide and Tape   History: Past Medical History:  Diagnosis Date  . History of diverticulitis of colon 05/20/2015   per patient report   . Hypertension   . Stroke (HCC)   . TIA (transient ischemic attack)   . Vertigo    Past Surgical History:  Procedure Laterality Date  . CHOLECYSTECTOMY  2000  . HERNIA REPAIR     umbilical hernia as a child  . REPLACEMENT TOTAL KNEE Bilateral    Family History  Problem Relation Age of Onset  . Heart attack Mother   . Hypertension Mother   . Alzheimer's disease Father   . Diabetes Father   . Hypertension  Sister   . Diabetes Brother   . Hypertension Sister   . Hypertension Sister   . Hypertension Sister   . Hypertension Sister   . Breast cancer Sister 58  . Breast cancer Maternal Aunt 61   Social History   Occupational History  . Retired    Social History Main Topics  . Smoking status: Never Smoker  . Smokeless tobacco: Never Used  . Alcohol use No  . Drug use: No  . Sexual activity: Not on file    Tobacco Counseling Counseling given: Not Answered   Activities of Daily Living In your present state of health, do you have any difficulty performing the following activities: 06/16/2017  Hearing? N  Vision? Y  Comment pt needs cataracts removed  Difficulty concentrating or making decisions? Y  Walking or climbing stairs? N  Dressing or bathing? N  Doing errands, shopping? N  Preparing Food and eating ? N  Using the Toilet? N  In the past six months, have you accidently leaked urine? N  Do you have problems with loss of bowel control? N  Managing your Medications? N  Managing your Finances? N  Housekeeping or managing your Housekeeping? N  Some recent data might be hidden    Immunizations and Health Maintenance Immunization History  Administered Date(s) Administered  . Influenza, High Dose Seasonal  PF 07/12/2016, 06/16/2017  . Influenza-Unspecified 05/12/2015  . Pneumococcal Conjugate-13 12/05/2014  . Pneumococcal Polysaccharide-23 05/09/2013  . Tdap 08/12/2011   Health Maintenance Due  Topic Date Due  . Hepatitis C Screening  1946/02/18    Patient Care Team: Malva LimesFisher, Donald E, MD as PCP - General (Family Medicine) Isla PenceWoodard, D Reid, OD as Consulting Physician (Optometry)  Indicate any recent Medical Services you may have received from other than [facility] providers in the past year (date may be approximate).     Assessment:   This is a routine wellness examination for Amy Ray.   Hearing/Vision screen Vision Screening Comments: Pt sees Dr Clydene PughWoodard for vision checks  yearly.   Dietary issues and exercise activities discussed: Current Exercise Habits: Home exercise routine, Type of exercise: Other - see comments (stationary bike), Time (Minutes): 40, Frequency (Times/Week): 5, Weekly Exercise (Minutes/Week): 200, Intensity: Mild, Exercise limited by: None identified  Goals    . Increase lean proteins          Recommend to continue eating lean meats only and no fried meats.       Depression Screen PHQ 2/9 Scores 06/16/2017 01/17/2017  PHQ - 2 Score 2 0  PHQ- 9 Score 4 3    Fall Risk Fall Risk  06/16/2017 01/17/2017  Falls in the past year? Yes Yes  Number falls in past yr: 2 or more 1  Injury with Fall? No No  Risk for fall due to : - History of fall(s)  Follow up Falls prevention discussed Falls prevention discussed    Cognitive Function: Pt declined screening today.        Screening Tests Health Maintenance  Topic Date Due  . Hepatitis C Screening  1946/02/18  . MAMMOGRAM  05/03/2019  . COLONOSCOPY  06/26/2019  . TETANUS/TDAP  08/11/2021  . INFLUENZA VACCINE  Completed  . DEXA SCAN  Completed  . PNA vac Low Risk Adult  Completed      Plan:  I have personally reviewed and addressed the Medicare Annual Wellness questionnaire and have noted the following in the patient's chart:  A. Medical and social history B. Use of alcohol, tobacco or illicit drugs  C. Current medications and supplements D. Functional ability and status E.  Nutritional status F.  Physical activity G. Advance directives H. List of other physicians I.  Hospitalizations, surgeries, and ER visits in previous 12 months J.  Vitals K. Screenings such as hearing and vision if needed, cognitive and depression L. Referrals and appointments - none  In addition, I have reviewed and discussed with patient certain preventive protocols, quality metrics, and best practice recommendations. A written personalized care plan for preventive services as well as general preventive  health recommendations were provided to patient.  See attached scanned questionnaire for additional information.   Signed,  Hyacinth MeekerMckenzie Montez Stryker, LPN Nurse Health Advisor   MD Recommendations: Pt needs Hep C screening done at next OV on 07/20/17. Pt declined blood work today.

## 2017-06-16 NOTE — Patient Instructions (Addendum)
Ms. Amy Ray , Thank you for taking time to come for your Medicare Wellness Visit. I appreciate your ongoing commitment to your health goals. Please review the following plan we discussed and let me know if I can assist you in the future.   Screening recommendations/referrals: Colonoscopy: up to date Mammogram: up to date Bone Density: up to date Recommended yearly ophthalmology/optometry visit for glaucoma screening and checkup Recommended yearly dental visit for hygiene and checkup  Vaccinations: Influenza vaccine: completed today Pneumococcal vaccine: completed series Tdap vaccine: up to date Shingles vaccine: declined    Advanced directives: Advance directive discussed with you today. Even though you declined this today please call our office should you change your mind and we can give you the proper paperwork for you to fill out.  Conditions/risks identified: Fall risk prevention; Obesity; Recommend to continue eating lean meats only and no fried meats.   Next appointment: 07/20/17   Preventive Care 70 Years and Older, Female  Preventive care refers to lifestyle choices and visits with your health care provider that can promote health and wellness. What does preventive care include?  A yearly physical exam. This is also called an annual well check.  Dental exams once or twice a year.  Routine eye exams. Ask your health care provider how often you should have your eyes checked.  Personal lifestyle choices, including:  Daily care of your teeth and gums.  Regular physical activity.  Eating a healthy diet.  Avoiding tobacco and drug use.  Limiting alcohol use.  Practicing safe sex.  Taking low-dose aspirin every day.  Taking vitamin and mineral supplements as recommended by your health care provider. What happens during an annual well check? The services and screenings done by your health care provider during your annual well check will depend on your age, overall  health, lifestyle risk factors, and family history of disease. Counseling  Your health care provider may ask you questions about your:  Alcohol use.  Tobacco use.  Drug use.  Emotional well-being.  Home and relationship well-being.  Sexual activity.  Eating habits.  History of falls.  Memory and ability to understand (cognition).  Work and work Astronomerenvironment.  Reproductive health. Screening  You may have the following tests or measurements:  Height, weight, and BMI.  Blood pressure.  Lipid and cholesterol levels. These may be checked every 5 years, or more frequently if you are over 70 years old.  Skin check.  Lung cancer screening. You may have this screening every year starting at age 70 if you have a 30-pack-year history of smoking and currently smoke or have quit within the past 15 years.  Fecal occult blood test (FOBT) of the stool. You may have this test every year starting at age 70.  Flexible sigmoidoscopy or colonoscopy. You may have a sigmoidoscopy every 5 years or a colonoscopy every 10 years starting at age 70.  Hepatitis C blood test.  Hepatitis B blood test.  Sexually transmitted disease (STD) testing.  Diabetes screening. This is done by checking your blood sugar (glucose) after you have not eaten for a while (fasting). You may have this done every 1-3 years.  Bone density scan. This is done to screen for osteoporosis. You may have this done starting at age 70.  Mammogram. This may be done every 1-2 years. Talk to your health care provider about how often you should have regular mammograms. Talk with your health care provider about your test results, treatment options, and if necessary, the  need for more tests. Vaccines  Your health care provider may recommend certain vaccines, such as:  Influenza vaccine. This is recommended every year.  Tetanus, diphtheria, and acellular pertussis (Tdap, Td) vaccine. You may need a Td booster every 10  years.  Zoster vaccine. You may need this after age 60.  Pneumococcal 13-valent conjugate (PCV13) vaccine. One dose is recommended after age 61.  Pneumococcal polysaccharide (PPSV23) vaccine. One dose is recommended after age 12. Talk to your health care provider about which screenings and vaccines you need and how often you need them. This information is not intended to replace advice given to you by your health care provider. Make sure you discuss any questions you have with your health care provider. Document Released: 10/24/2015 Document Revised: 06/16/2016 Document Reviewed: 07/29/2015 Elsevier Interactive Patient Education  2017 [place] Prevention in the Home Falls can cause injuries. They can happen to people of all ages. There are many things you can do to make your home safe and to help prevent falls. What can I do on the outside of my home?  Regularly fix the edges of walkways and driveways and fix any cracks.  Remove anything that might make you trip as you walk through a door, such as a raised step or threshold.  Trim any bushes or trees on the path to your home.  Use bright outdoor lighting.  Clear any walking paths of anything that might make someone trip, such as rocks or tools.  Regularly check to see if handrails are loose or broken. Make sure that both sides of any steps have handrails.  Any raised decks and porches should have guardrails on the edges.  Have any leaves, snow, or ice cleared regularly.  Use sand or salt on walking paths during winter.  Clean up any spills in your garage right away. This includes oil or grease spills. What can I do in the bathroom?  Use night lights.  Install grab bars by the toilet and in the tub and shower. Do not use towel bars as grab bars.  Use non-skid mats or decals in the tub or shower.  If you need to sit down in the shower, use a plastic, non-slip stool.  Keep the floor dry. Clean up any water that  spills on the floor as soon as it happens.  Remove soap buildup in the tub or shower regularly.  Attach bath mats securely with double-sided non-slip rug tape.  Do not have throw rugs and other things on the floor that can make you trip. What can I do in the bedroom?  Use night lights.  Make sure that you have a light by your bed that is easy to reach.  Do not use any sheets or blankets that are too big for your bed. They should not hang down onto the floor.  Have a firm chair that has side arms. You can use this for support while you get dressed.  Do not have throw rugs and other things on the floor that can make you trip. What can I do in the kitchen?  Clean up any spills right away.  Avoid walking on wet floors.  Keep items that you use a lot in easy-to-reach places.  If you need to reach something above you, use a strong step stool that has a grab bar.  Keep electrical cords out of the way.  Do not use floor polish or wax that makes floors slippery. If you must use wax,  use non-skid floor wax.  Do not have throw rugs and other things on the floor that can make you trip. What can I do with my stairs?  Do not leave any items on the stairs.  Make sure that there are handrails on both sides of the stairs and use them. Fix handrails that are broken or loose. Make sure that handrails are as long as the stairways.  Check any carpeting to make sure that it is firmly attached to the stairs. Fix any carpet that is loose or worn.  Avoid having throw rugs at the top or bottom of the stairs. If you do have throw rugs, attach them to the floor with carpet tape.  Make sure that you have a light switch at the top of the stairs and the bottom of the stairs. If you do not have them, ask someone to add them for you. What else can I do to help prevent falls?  Wear shoes that:  Do not have high heels.  Have rubber bottoms.  Are comfortable and fit you well.  Are closed at the  toe. Do not wear sandals.  If you use a stepladder:  Make sure that it is fully opened. Do not climb a closed stepladder.  Make sure that both sides of the stepladder are locked into place.  Ask someone to hold it for you, if possible.  Clearly mark and make sure that you can see:  Any grab bars or handrails.  First and last steps.  Where the edge of each step is.  Use tools that help you move around (mobility aids) if they are needed. These include:  Canes.  Walkers.  Scooters.  Crutches.  Turn on the lights when you go into a dark area. Replace any light bulbs as soon as they burn out.  Set up your furniture so you have a clear path. Avoid moving your furniture around.  If any of your floors are uneven, fix them.  If there are any pets around you, be aware of where they are.  Review your medicines with your doctor. Some medicines can make you feel dizzy. This can increase your chance of falling. Ask your doctor what other things that you can do to help prevent falls. This information is not intended to replace advice given to you by your health care provider. Make sure you discuss any questions you have with your health care provider. Document Released: 07/24/2009 Document Revised: 03/04/2016 Document Reviewed: 11/01/2014 Elsevier Interactive Patient Education  2017 [employer].

## 2017-06-20 DIAGNOSIS — M25512 Pain in left shoulder: Secondary | ICD-10-CM | POA: Diagnosis not present

## 2017-07-20 ENCOUNTER — Encounter: Payer: Self-pay | Admitting: Family Medicine

## 2017-07-20 ENCOUNTER — Ambulatory Visit (INDEPENDENT_AMBULATORY_CARE_PROVIDER_SITE_OTHER): Payer: Medicare Other | Admitting: Family Medicine

## 2017-07-20 VITALS — BP 156/92 | HR 86 | Temp 98.4°F | Resp 16 | Wt 208.0 lb

## 2017-07-20 DIAGNOSIS — I10 Essential (primary) hypertension: Secondary | ICD-10-CM

## 2017-07-20 MED ORDER — HYDROCHLOROTHIAZIDE 12.5 MG PO TABS: 12.5000 mg | ORAL_TABLET | Freq: Every day | ORAL | 1 refills | Status: DC

## 2017-07-20 NOTE — Progress Notes (Signed)
Patient: Amy Ray Female    DOB: 22-Sep-1946   71 y.o.   MRN: [id number] Visit Date: 07/20/2017  Today's Provider: Mila Merry, MD   Chief Complaint  Patient presents with  . Hypertension   Subjective:    HPI  Hypertension, follow-up:  BP Readings from Last 3 Encounters:  06/16/17 (!) 144/82  05/30/17 114/84  01/17/17 140/88    She was last seen for hypertension 6 months ago.  BP at that visit was 140/88. Management since that visit includes no changes. She reports good compliance with treatment. She is having side effects. Plavix causing easy bruising. She is exercising. She is adherent to low salt diet.   Outside blood pressures are checked at home and average 137/84. She is experiencing none.  Patient denies chest pain, chest pressure/discomfort, claudication, dyspnea, exertional chest pressure/discomfort, fatigue, irregular heart beat, lower extremity edema, near-syncope, orthopnea, palpitations, paroxysmal nocturnal dyspnea, syncope and tachypnea.   Cardiovascular risk factors include advanced age (older than 38 for men, 71 for women) and hypertension.  Use of agents associated with hypertension: NSAIDS.     Weight trend: fluctuating a bit Wt Readings from Last 3 Encounters:  06/16/17 206 lb 3.2 oz (93.5 kg)  05/30/17 207 lb 6.4 oz (94.1 kg)  01/17/17 215 lb (97.5 kg)    Current diet: well balanced  ------------------------------------------------------------------------ e.     Allergies  Allergen Reactions  . Quinapril-Hydrochlorothiazide Other (See Comments)    Other reaction(s): Headache  . Tape      Current Outpatient Prescriptions:  .  aspirin 81 MG tablet, Take 1 tablet by mouth daily., Disp: , Rfl:  .  carbamazepine (TEGRETOL) 200 MG tablet, Take 1 tablet (200 mg total) by mouth 2 (two) times daily., Disp: 180 tablet, Rfl: 3 .  clopidogrel (PLAVIX) 75 MG tablet, TAKE 1 TABLET(75 MG) BY MOUTH DAILY, Disp: 90 tablet, Rfl: 4 .   DILT-XR 180 MG 24 hr capsule, TAKE 2 CAPSULES(360 MG) BY MOUTH DAILY, Disp: 60 capsule, Rfl: 12 .  IRON PO, Take 1 tablet by mouth daily., Disp: , Rfl:  .  nortriptyline (PAMELOR) 10 MG capsule, TAKE 1 TO 2 CAPSULES BY  MOUTH AT BEDTIME, Disp: 180 capsule, Rfl: 4 .  simvastatin (ZOCOR) 20 MG tablet, TAKE 1 TABLET BY MOUTH  DAILY, Disp: 90 tablet, Rfl: 4  Review of Systems  Constitutional: Negative for appetite change, chills, fatigue and fever.  Respiratory: Negative for chest tightness and shortness of breath.   Cardiovascular: Negative for chest pain and palpitations.  Gastrointestinal: Negative for abdominal pain, nausea and vomiting.  Neurological: Negative for dizziness and weakness.  Hematological: Bruises/bleeds easily.    Social History  Substance Use Topics  . Smoking status: Never Smoker  . Smokeless tobacco: Never Used  . Alcohol use No   Objective:   BP (!) 156/92 (BP Location: Right Arm, Cuff Size: Large)   Pulse 86   Temp 98.4 F (36.9 C) (Oral)   Resp 16   Wt 208 lb (94.3 kg)   SpO2 97% Comment: room air  BMI 40.62 kg/m  Vitals:   07/20/17 1117 07/20/17 1122  BP: (!) 152/100 (!) 156/92  Pulse: 86   Resp: 16   Temp: 98.4 F (36.9 C)   TempSrc: Oral   SpO2: 97%   Weight: 208 lb (94.3 kg)      Physical Exam   General Appearance:    Alert, cooperative, no distress  Eyes:    PERRL, conjunctiva/corneas  clear, EOM's intact       Lungs:     Clear to auscultation bilaterally, respirations unlabored  Heart:    Regular rate and rhythm  Neurologic:   Awake, alert, oriented x 3. No apparent focal neurological           defect.           Assessment & Plan:     1. Essential (primary) hypertension Uncontrolled. Add - hydrochlorothiazide (HYDRODIURIL) 12.5 MG tablet; Take 1 tablet (12.5 mg total) by mouth daily.  Dispense: 30 tablet; Refill: 1  Follow up 1 month for BP check and routine labs.       Mila Merry, MD  [facility]  [facility]

## 2017-08-19 ENCOUNTER — Ambulatory Visit (INDEPENDENT_AMBULATORY_CARE_PROVIDER_SITE_OTHER): Payer: Medicare Other | Admitting: Family Medicine

## 2017-08-19 ENCOUNTER — Encounter: Payer: Self-pay | Admitting: Family Medicine

## 2017-08-19 VITALS — BP 124/80 | HR 86 | Temp 98.0°F | Resp 16 | Wt 203.0 lb

## 2017-08-19 DIAGNOSIS — I10 Essential (primary) hypertension: Secondary | ICD-10-CM | POA: Diagnosis not present

## 2017-08-19 DIAGNOSIS — E785 Hyperlipidemia, unspecified: Secondary | ICD-10-CM

## 2017-08-19 MED ORDER — HYDROCHLOROTHIAZIDE 12.5 MG PO TABS: 12.5000 mg | ORAL_TABLET | Freq: Every day | ORAL | 12 refills | Status: DC

## 2017-08-19 NOTE — Progress Notes (Signed)
Patient: Amy LombardHelena Ray Female    DOB: Oct 05, 1946   71 y.o.   MRN: [id number] Visit Date: 08/19/2017  Today's Provider: Mila Merryonald Fisher, MD   Chief Complaint  Patient presents with  . Follow-up  . Hypertension   Subjective:    HPI   Hypertension, follow-up:  BP Readings from Last 3 Encounters:  08/19/17 124/80  07/20/17 (!) 156/92  06/16/17 (!) 144/82    She was last seen for hypertension 1 months ago.  BP at that visit was 152/100. Management since that visit includes; added HCTZ 12.5 mg qd.She reports good compliance with treatment. She is not having side effects. none She is exercising. She is adherent to low salt diet.   Outside blood pressures are normal. She is experiencing none.  Patient denies none.   Cardiovascular risk factors include none.  Use of agents associated with hypertension: none.   ----------------------------------------------------------------  Patient states she has not had any problems since starting HCTZ. Patient home bp reading have all been normal.   Allergies  Allergen Reactions  . Quinapril-Hydrochlorothiazide Other (See Comments)    Other reaction(s): Headache  . Tape      Current Outpatient Medications:  .  aspirin 81 MG tablet, Take 1 tablet by mouth daily., Disp: , Rfl:  .  carbamazepine (TEGRETOL) 200 MG tablet, Take 1 tablet (200 mg total) by mouth 2 (two) times daily., Disp: 180 tablet, Rfl: 3 .  clopidogrel (PLAVIX) 75 MG tablet, TAKE 1 TABLET(75 MG) BY MOUTH DAILY, Disp: 90 tablet, Rfl: 4 .  DILT-XR 180 MG 24 hr capsule, TAKE 2 CAPSULES(360 MG) BY MOUTH DAILY, Disp: 60 capsule, Rfl: 12 .  hydrochlorothiazide (HYDRODIURIL) 12.5 MG tablet, Take 1 tablet (12.5 mg total) by mouth daily., Disp: 30 tablet, Rfl: 1 .  IRON PO, Take 1 tablet by mouth daily., Disp: , Rfl:  .  nortriptyline (PAMELOR) 10 MG capsule, TAKE 1 TO 2 CAPSULES BY  MOUTH AT BEDTIME, Disp: 180 capsule, Rfl: 4 .  simvastatin (ZOCOR) 20 MG tablet, TAKE 1  TABLET BY MOUTH  DAILY, Disp: 90 tablet, Rfl: 4  Review of Systems  Constitutional: Negative for appetite change, chills, fatigue and fever.  Respiratory: Negative for chest tightness and shortness of breath.   Cardiovascular: Negative for chest pain and palpitations.  Gastrointestinal: Negative for abdominal pain, nausea and vomiting.  Neurological: Negative for dizziness and weakness.    Social History   Tobacco Use  . Smoking status: Never Smoker  . Smokeless tobacco: Never Used  Substance Use Topics  . Alcohol use: No    Alcohol/week: 0.0 oz   Objective:   BP 124/80 (BP Location: Right Arm, Patient Position: Sitting, Cuff Size: Large)   Pulse 86   Temp 98 F (36.7 C) (Oral)   Resp 16   Wt 203 lb (92.1 kg)   SpO2 98%   BMI 39.65 kg/m     Physical Exam  General appearance: alert, well developed, well nourished, cooperative and in no distress Head: Normocephalic, without obvious abnormality, atraumatic Respiratory: Respirations even and unlabored, normal respiratory rate Extremities: No gross deformities Skin: Skin color, texture, turgor normal. No rashes seen  Psych: Appropriate mood and affect. Neurologic: Mental status: Alert, oriented to person, place, and time, thought content appropriate.     Assessment & Plan:     1. Essential (primary) hypertension Much better with addition of hctz - hydrochlorothiazide (HYDRODIURIL) 12.5 MG tablet; Take 1 tablet (12.5 mg total) daily by mouth.  Dispense: 30 tablet; Refill: 12 - COMPLETE METABOLIC PANEL WITH GFR  2. Hyperlipidemia, unspecified hyperlipidemia type She is tolerating simvastatin well with no adverse effects.   - COMPLETE METABOLIC PANEL WITH GFR - Lipid panel  Return in about 6 months (around 02/16/2018).        Mila Merryonald Fisher, MD  [facility]

## 2017-08-28 ENCOUNTER — Emergency Department
Admission: EM | Admit: 2017-08-28 | Discharge: 2017-08-28 | Disposition: A | Discharge status: Home / Self Care | Payer: Medicare Other | Attending: Emergency Medicine | Admitting: Emergency Medicine

## 2017-08-28 ENCOUNTER — Other Ambulatory Visit: Payer: Self-pay

## 2017-08-28 ENCOUNTER — Encounter: Payer: Self-pay | Admitting: Emergency Medicine

## 2017-08-28 ENCOUNTER — Emergency Department: Payer: Medicare Other

## 2017-08-28 DIAGNOSIS — Z79899 Other long term (current) drug therapy: Secondary | ICD-10-CM | POA: Diagnosis not present

## 2017-08-28 DIAGNOSIS — Z7902 Long term (current) use of antithrombotics/antiplatelets: Secondary | ICD-10-CM | POA: Insufficient documentation

## 2017-08-28 DIAGNOSIS — K5732 Diverticulitis of large intestine without perforation or abscess without bleeding: Secondary | ICD-10-CM | POA: Diagnosis not present

## 2017-08-28 DIAGNOSIS — Z9049 Acquired absence of other specified parts of digestive tract: Secondary | ICD-10-CM | POA: Insufficient documentation

## 2017-08-28 DIAGNOSIS — Z8673 Personal history of transient ischemic attack (TIA), and cerebral infarction without residual deficits: Secondary | ICD-10-CM | POA: Diagnosis not present

## 2017-08-28 DIAGNOSIS — Z96653 Presence of artificial knee joint, bilateral: Secondary | ICD-10-CM | POA: Diagnosis not present

## 2017-08-28 DIAGNOSIS — I10 Essential (primary) hypertension: Secondary | ICD-10-CM | POA: Diagnosis not present

## 2017-08-28 DIAGNOSIS — K5792 Diverticulitis of intestine, part unspecified, without perforation or abscess without bleeding: Secondary | ICD-10-CM

## 2017-08-28 DIAGNOSIS — Z7982 Long term (current) use of aspirin: Secondary | ICD-10-CM | POA: Diagnosis not present

## 2017-08-28 DIAGNOSIS — R1031 Right lower quadrant pain: Secondary | ICD-10-CM | POA: Diagnosis present

## 2017-08-28 LAB — CBC
HEMATOCRIT: 38.9 % (ref 35.0–47.0)
HEMOGLOBIN: 13.1 g/dL (ref 12.0–16.0)
MCH: 33.3 pg (ref 26.0–34.0)
MCHC: 33.8 g/dL (ref 32.0–36.0)
MCV: 98.5 fL (ref 80.0–100.0)
Platelets: 171 10*3/uL (ref 150–440)
RBC: 3.95 MIL/uL (ref 3.80–5.20)
RDW: 13.2 % (ref 11.5–14.5)
WBC: 8.4 10*3/uL (ref 3.6–11.0)

## 2017-08-28 LAB — URINALYSIS, COMPLETE (UACMP) WITH MICROSCOPIC
BACTERIA UA: NONE SEEN
BILIRUBIN URINE: NEGATIVE
Glucose, UA: NEGATIVE mg/dL
HGB URINE DIPSTICK: NEGATIVE
Ketones, ur: NEGATIVE mg/dL
NITRITE: NEGATIVE
Protein, ur: NEGATIVE mg/dL
SPECIFIC GRAVITY, URINE: 1.015 (ref 1.005–1.030)
pH: 7 (ref 5.0–8.0)

## 2017-08-28 LAB — COMPREHENSIVE METABOLIC PANEL
ALT: 19 U/L (ref 14–54)
ANION GAP: 11 (ref 5–15)
AST: 30 U/L (ref 15–41)
Albumin: 4.1 g/dL (ref 3.5–5.0)
Alkaline Phosphatase: 133 U/L — ABNORMAL HIGH (ref 38–126)
BILIRUBIN TOTAL: 0.6 mg/dL (ref 0.3–1.2)
BUN: 16 mg/dL (ref 6–20)
CO2: 27 mmol/L (ref 22–32)
Calcium: 9.1 mg/dL (ref 8.9–10.3)
Chloride: 102 mmol/L (ref 101–111)
Creatinine, Ser: 0.84 mg/dL (ref 0.44–1.00)
Glucose, Bld: 95 mg/dL (ref 65–99)
POTASSIUM: 3.4 mmol/L — AB (ref 3.5–5.1)
Sodium: 140 mmol/L (ref 135–145)
TOTAL PROTEIN: 7.8 g/dL (ref 6.5–8.1)

## 2017-08-28 LAB — LIPASE, BLOOD: Lipase: 32 U/L (ref 11–51)

## 2017-08-28 MED ORDER — IBUPROFEN 400 MG PO TABS
400.0000 mg | ORAL_TABLET | ORAL | Status: AC
  Administered 2017-08-28: 400 mg via ORAL
  Filled 2017-08-28: qty 1

## 2017-08-28 MED ORDER — CIPROFLOXACIN HCL 500 MG PO TABS: 500.0000 mg | ORAL_TABLET | Freq: Two times a day (BID) | ORAL | 0 refills | Status: AC

## 2017-08-28 MED ORDER — METRONIDAZOLE 500 MG PO TABS
500.0000 mg | ORAL_TABLET | Freq: Once | ORAL | Status: AC
  Administered 2017-08-28: 500 mg via ORAL
  Filled 2017-08-28: qty 1

## 2017-08-28 MED ORDER — IOPAMIDOL (ISOVUE-300) INJECTION 61%
30.0000 mL | Freq: Once | INTRAVENOUS | Status: AC | PRN
  Administered 2017-08-28: 30 mL via ORAL

## 2017-08-28 MED ORDER — CIPROFLOXACIN HCL 500 MG PO TABS
500.0000 mg | ORAL_TABLET | Freq: Once | ORAL | Status: AC
  Administered 2017-08-28: 500 mg via ORAL
  Filled 2017-08-28: qty 1

## 2017-08-28 MED ORDER — ONDANSETRON 4 MG PO TBDP: 4.0000 mg | ORAL_TABLET | Freq: Four times a day (QID) | ORAL | 0 refills | Status: DC | PRN

## 2017-08-28 MED ORDER — METRONIDAZOLE 500 MG PO TABS: 500.0000 mg | ORAL_TABLET | Freq: Three times a day (TID) | ORAL | 0 refills | Status: DC

## 2017-08-28 MED ORDER — ACETAMINOPHEN 325 MG PO TABS
650.0000 mg | ORAL_TABLET | Freq: Once | ORAL | Status: AC
  Administered 2017-08-28: 650 mg via ORAL
  Filled 2017-08-28: qty 2

## 2017-08-28 MED ORDER — IOPAMIDOL (ISOVUE-300) INJECTION 61%
100.0000 mL | Freq: Once | INTRAVENOUS | Status: AC | PRN
  Administered 2017-08-28: 100 mL via INTRAVENOUS

## 2017-08-28 NOTE — ED Notes (Signed)
Olivia RN aware of placement in Room 19.

## 2017-08-28 NOTE — ED Notes (Signed)
Patient transported to CT 

## 2017-08-28 NOTE — ED Provider Notes (Signed)
[facility] Emergency Department Provider Note   ____________________________________________   First MD Initiated Contact with Patient 08/28/17 [phone]     (approximate)  I have reviewed the triage vital signs and the nursing notes.   HISTORY  Chief Complaint Abdominal Pain    HPI Amy Ray is a 71 y.o. female here for evaluation of abdominal pain  Patient reports that yesterday she began experiencing discomfort primarily in her right middle flank region, for the last 24 hours she thought she may be slightly constipated so took laxatives this morning.  She had a small bowel movement yesterday.  No nausea or vomiting.  No fevers or chills.  No chest pain or trouble breathing.  Reports he does have a history of diverticulitis but is 90 loose stools or fevers.  The pain this morning started to feel more like discomfort during urination, but her urine did not smell funny or look cloudy.  Denies blood in the urine.  She feels that her pain right now is about a 2-3 out of 10 and it seems to be settled now in her lower abdomen and is made worse with urination  Past Medical History:  Diagnosis Date  . History of diverticulitis of colon 05/20/2015   per patient report   . Hypertension   . Stroke (HCC)   . TIA (transient ischemic attack)   . Vertigo     Patient Active Problem List   Diagnosis Date Noted  . Low back pain 05/20/2015  . Fatigue 05/20/2015  . History of CVA (cerebrovascular accident) 05/20/2015  . Insomnia 05/20/2015  . Obesity 05/20/2015  . Positive H. pylori test 05/20/2015  . Tremor 05/20/2015  . Dysfunction of eustachian tube 05/20/2015  . Diverticulosis 05/20/2015  . Essential (primary) hypertension 07/17/2007  . Alterations of sensations, late effect of cerebrovascular disease 03/11/2005  . Hyperlipidemia 10/12/2003  . Trigeminal neuralgia 10/12/2003    Past Surgical History:  Procedure Laterality Date  . CHOLECYSTECTOMY  2000  .  HERNIA REPAIR     umbilical hernia as a child  . REPLACEMENT TOTAL KNEE Bilateral     Prior to Admission medications   Medication Sig Start Date End Date Taking? Authorizing Provider  aspirin 81 MG tablet Take 1 tablet by mouth daily.   Yes [provider]  carbamazepine (TEGRETOL) 200 MG tablet Take 1 tablet (200 mg total) by mouth 2 (two) times daily. 02/07/17  Yes Malva Limes, MD  clopidogrel (PLAVIX) 75 MG tablet TAKE 1 TABLET(75 MG) BY MOUTH DAILY 01/01/17  Yes Malva Limes, MD  DILT-XR 180 MG 24 hr capsule TAKE 2 CAPSULES(360 MG) BY MOUTH DAILY 04/11/17  Yes Malva Limes, MD  hydrochlorothiazide (HYDRODIURIL) 12.5 MG tablet Take 1 tablet (12.5 mg total) daily by mouth. 08/19/17  Yes Malva Limes, MD  IRON PO Take 1 tablet by mouth daily.   Yes [provider]  nortriptyline (PAMELOR) 10 MG capsule TAKE 1 TO 2 CAPSULES BY  MOUTH AT BEDTIME 08/16/16  Yes Malva Limes, MD  simvastatin (ZOCOR) 20 MG tablet TAKE 1 TABLET BY MOUTH  DAILY 09/03/16  Yes Malva Limes, MD  ciprofloxacin (CIPRO) 500 MG tablet Take 1 tablet (500 mg total) 2 (two) times daily for 10 days by mouth. 08/28/17 09/07/17  Sharyn Creamer, MD  metroNIDAZOLE (FLAGYL) 500 MG tablet Take 1 tablet (500 mg total) 3 (three) times daily by mouth. 08/28/17   Sharyn Creamer, MD  ondansetron (ZOFRAN ODT) 4 MG  disintegrating tablet Take 1 tablet (4 mg total) every 6 (six) hours as needed by mouth for nausea or vomiting. 08/28/17   Sharyn CreamerQuale, Jailon Schaible, MD    Allergies Quinapril-hydrochlorothiazide and Tape  Family History  Problem Relation Age of Onset  . Heart attack Mother   . Hypertension Mother   . Alzheimer's disease Father   . Diabetes Father   . Hypertension Sister   . Diabetes Brother   . Hypertension Sister   . Hypertension Sister   . Hypertension Sister   . Hypertension Sister   . Breast cancer Sister [id number]  . Breast cancer Maternal Aunt [id number]    Social History Social History   Tobacco Use    . Smoking status: Never Smoker  . Smokeless tobacco: Never Used  Substance Use Topics  . Alcohol use: No    Alcohol/week: 0.0 oz  . Drug use: No    Review of Systems Constitutional: No fever/chills Eyes: No visual changes. ENT: No sore throat. Cardiovascular: Denies chest pain. Respiratory: Denies shortness of breath. Gastrointestinal: No vomiting.  No diarrhea.  No constipation. Genitourinary: Negative for dysuria. Musculoskeletal: Negative for back pain. Skin: Negative for rash. Neurological: Negative for headaches, focal weakness or numbness.    ____________________________________________   PHYSICAL EXAM:  VITAL SIGNS: ED Triage Vitals  Enc Vitals Group     BP 08/28/17 0835 138/68     Pulse Rate 08/28/17 0835 98     Resp 08/28/17 0835 16     Temp 08/28/17 0835 98.1 F (36.7 C)     Temp Source 08/28/17 0835 Oral     SpO2 08/28/17 0835 99 %     Weight 08/28/17 0836 203 lb (92.1 kg)     Height 08/28/17 0836 5\' 1"  (1.549 m)     Head Circumference --      Peak Flow --      Pain Score 08/28/17 0835 0     Pain Loc --      Pain Edu? --      Excl. in GC? --     Constitutional: Alert and oriented. Well appearing and in no acute distress. Eyes: Conjunctivae are normal. Head: Atraumatic. Nose: No congestion/rhinnorhea. Mouth/Throat: Mucous membranes are moist. Neck: No stridor.   Cardiovascular: Normal rate, regular rhythm. Grossly normal heart sounds.  Good peripheral circulation. Respiratory: Normal respiratory effort.  No retractions. Lungs CTAB. Gastrointestinal: Soft and mild tenderness without rebound guarding or peritonitis is noted primarily along the left lower and left flank.  No rebound or guarding, no tenderness noted across the right mid or lower abdomen. No distention. Musculoskeletal: No lower extremity tenderness nor edema. Neurologic:  Normal speech and language. No gross focal neurologic deficits are appreciated.  Skin:  Skin is warm, dry and  intact. No rash noted. Psychiatric: Mood and affect are normal. Speech and behavior are normal.  ____________________________________________   LABS (all labs ordered are listed, but only abnormal results are displayed)  Labs Reviewed  COMPREHENSIVE METABOLIC PANEL - Abnormal; Notable for the following components:      Result Value   Potassium 3.4 (*)    Alkaline Phosphatase 133 (*)    All other components within normal limits  URINALYSIS, COMPLETE (UACMP) WITH MICROSCOPIC - Abnormal; Notable for the following components:   Color, Urine YELLOW (*)    APPearance CLOUDY (*)    Leukocytes, UA LARGE (*)    Squamous Epithelial / LPF TOO NUMEROUS TO COUNT (*)    All other components within normal limits  URINE CULTURE  LIPASE, BLOOD  CBC   ____________________________________________  EKG  Reviewed and entered by me at 9:40 AM Heart a 5 Cures 100 QTC 400 Normal sinus rhythm, no evidence of ischemic changes noted, probable left ventricular hypertrophy ____________________________________________  RADIOLOGY  Ct Abdomen Pelvis W Contrast  Result Date: 08/28/2017 CLINICAL DATA:  Lower abd/pel pain since last night, denies N/V/D, hx hernia repair, chole. ^16900mL ISOVUE-300 IOPAMIDOL (ISOVUE-300) INJECTION 61% EXAM: CT ABDOMEN AND PELVIS WITH CONTRAST TECHNIQUE: Multidetector CT imaging of the abdomen and pelvis was performed using the standard protocol following bolus administration of intravenous contrast. CONTRAST:  100mL ISOVUE-300 IOPAMIDOL (ISOVUE-300) INJECTION 61% COMPARISON:  CT abdomen dated 08/29/2016. FINDINGS: Lower chest: No acute abnormality. Hepatobiliary: No focal liver abnormality is seen. Status post cholecystectomy. No biliary dilatation. Pancreas: Unremarkable. No pancreatic ductal dilatation or surrounding inflammatory changes. Spleen: Normal in size without focal abnormality. Adrenals/Urinary Tract: Adrenal glands appear normal. Kidneys are unremarkable without mass,  stone or hydronephrosis. No perinephric inflammation. No ureteral or bladder calculi identified. Bladder appears normal. Stomach/Bowel: Thickening of the walls of the lower descending colon, with surrounding pericolonic inflammation, consistent with acute diverticulitis. Extensive diverticulosis throughout the lower descending and sigmoid colon. No dilated large or small bowel loops. Appendix is not convincingly seen but there are no inflammatory changes about the cecum to suggest acute appendicitis. Vascular/Lymphatic: Aortic atherosclerosis. No enlarged abdominal or pelvic lymph nodes. Reproductive: Surgical changes of the uterus and/or intrauterine device appears stable. No adnexal mass or free fluid. Other: No abscess collection identified. No free intraperitoneal air. Musculoskeletal: Degenerative changes of the thoracolumbar spine, at least moderate in degree in the lumbar spine. No acute or suspicious osseous finding. IMPRESSION: 1. Acute uncomplicated diverticulitis of the lower descending colon, with associated bowel wall thickening and pericolonic inflammation/fluid stranding. No abscess collection seen. No evidence of bowel perforation. No free intraperitoneal air. No bowel obstruction. 2. Aortic atherosclerosis. 3. Additional chronic/incidental findings detailed above. Electronically Signed   By: Bary RichardStan  Maynard M.D.   On: 08/28/2017 11:41    CT demonstrates diverticulitis, no evidence of acute complication noted ____________________________________________   PROCEDURES  Procedure(s) performed: None  Procedures  Critical Care performed: No  ____________________________________________   INITIAL IMPRESSION / ASSESSMENT AND PLAN / ED COURSE  Pertinent labs & imaging results that were available during my care of the patient were reviewed by me and considered in my medical decision making (see chart for details).  Overall nontoxic, well-appearing, afebrile with reassuring lab  work.  Differential diagnosis includes but is not limited to, abdominal perforation, aortic dissection, cholecystitis, appendicitis, diverticulitis, colitis, esophagitis/gastritis, kidney stone, pyelonephritis, urinary tract infection, aortic aneurysm. All are considered in decision and treatment plan. Based upon the patient's presentation and risk factors, and based on the patient's left-sided tenderness and history of suspect this may represent some mild diverticulitis, also consider possibility of urinary tract infection given the association with urination that seems to worsen the pain.  ----------------------------------------- 12:12 PM on 08/28/2017 -----------------------------------------  Patient well-appearing, in no distress.  Reports her pain is well controlled at present.  Reviewed CT findings, and discussed with her and we will start her on antibiotics, I advised her on careful follow-up and return precautions.  Diagnosis diverticulitis.  Urine culture is pending, but patient will be placed on Cipro in the event she was to have a concomitant UTI though I suspect her symptoms are likely related to diverticulitis at this time.  Patient and her husband will agreeable with plan.  Return precautions and treatment recommendations and follow-up discussed with the patient who is agreeable with the plan.   ____________________________________________   FINAL CLINICAL IMPRESSION(S) / ED DIAGNOSES  Final diagnoses:  Acute diverticulitis of intestine      NEW MEDICATIONS STARTED DURING THIS VISIT:  This SmartLink is deprecated. Use AVSMEDLIST instead to display the medication list for a patient.   Note:  This document was prepared using Dragon voice recognition software and may include unintentional dictation errors.     Sharyn Creamer, MD 08/28/17 [phone]

## 2017-08-28 NOTE — ED Triage Notes (Signed)
C/O lower abdominal pain x 1 day.  Pain worse with urination and when laying down.  Denies N/V.  Patient has history of diverticulitis and states has issues of constipation.  Last BM yesterday with small results.  States took Milk of Magnesia this morning with no results.

## 2017-08-28 NOTE — ED Notes (Signed)
Pt ambulatory upon discharge and refused wheel chair. Pt and husband verbalized understanding of discharge instructions, follow-up care and prescriptions. A&O x4. VSS. Skin warm and dry.

## 2017-08-28 NOTE — Discharge Instructions (Signed)
We believe your symptoms are caused by diverticulitis.  Most of the time this condition (please read through the included information) can be cured with outpatient antibiotics.  Please take the full course of prescribed medication(s) and follow up with the doctors recommended above.  Return to the ED if your abdominal pain worsens or fails to improve, you develop bloody vomiting, bloody diarrhea, you are unable to tolerate fluids due to vomiting, fever greater than 101, or other symptoms that concern you.    

## 2017-08-28 NOTE — ED Notes (Signed)
Pt resting in bed, resp even and unlabored, pt drinking CT contrast, husband at bedside

## 2017-08-29 LAB — URINE CULTURE: Special Requests: NORMAL

## 2017-09-07 ENCOUNTER — Other Ambulatory Visit: Payer: Self-pay | Admitting: Family Medicine

## 2017-09-09 ENCOUNTER — Ambulatory Visit (INDEPENDENT_AMBULATORY_CARE_PROVIDER_SITE_OTHER): Payer: Medicare Other | Admitting: Family Medicine

## 2017-09-09 ENCOUNTER — Encounter: Payer: Self-pay | Admitting: Family Medicine

## 2017-09-09 VITALS — BP 138/82 | HR 103 | Temp 99.0°F | Resp 18 | Wt 211.0 lb

## 2017-09-09 DIAGNOSIS — J40 Bronchitis, not specified as acute or chronic: Secondary | ICD-10-CM

## 2017-09-09 MED ORDER — AZITHROMYCIN 250 MG PO TABS: ORAL_TABLET | ORAL | 0 refills | Status: AC

## 2017-09-09 NOTE — Patient Instructions (Signed)

## 2017-09-09 NOTE — Progress Notes (Signed)
Patient: Amy LombardHelena Ray Female    DOB: 1946-06-13   71 y.o.   MRN: [id number] Visit Date: 09/09/2017  Today's Provider: Mila Merryonald Laban Orourke, MD   Chief Complaint  Patient presents with  . Cough    x 1 week   Subjective:    Cough  This is a new problem. Episode onset: 1 week ago. The problem has been unchanged. The cough is productive of sputum (white- yellow sputum). Associated symptoms include chest pain (when coughing), ear congestion, headaches, nasal congestion, postnasal drip, rhinorrhea, a sore throat (resolved), shortness of breath, sweats and wheezing. Pertinent negatives include no chills, ear pain, fever, hemoptysis or myalgias. Treatments tried: honey and tea. The treatment provided no relief.       Allergies  Allergen Reactions  . Quinapril-Hydrochlorothiazide Other (See Comments)    Other reaction(s): Headache  . Tape      Current Outpatient Medications:  .  aspirin 81 MG tablet, Take 1 tablet by mouth daily., Disp: , Rfl:  .  carbamazepine (TEGRETOL) 200 MG tablet, Take 1 tablet (200 mg total) by mouth 2 (two) times daily., Disp: 180 tablet, Rfl: 3 .  clopidogrel (PLAVIX) 75 MG tablet, TAKE 1 TABLET(75 MG) BY MOUTH DAILY, Disp: 90 tablet, Rfl: 4 .  DILT-XR 180 MG 24 hr capsule, TAKE 2 CAPSULES(360 MG) BY MOUTH DAILY, Disp: 60 capsule, Rfl: 12 .  hydrochlorothiazide (HYDRODIURIL) 12.5 MG tablet, Take 1 tablet (12.5 mg total) daily by mouth., Disp: 30 tablet, Rfl: 12 .  IRON PO, Take 1 tablet by mouth daily., Disp: , Rfl:  .  nortriptyline (PAMELOR) 10 MG capsule, TAKE 1 TO 2 CAPSULES BY  MOUTH AT BEDTIME, Disp: 180 capsule, Rfl: 4 .  simvastatin (ZOCOR) 20 MG tablet, TAKE 1 TABLET BY MOUTH  DAILY, Disp: 90 tablet, Rfl: 4 .  metroNIDAZOLE (FLAGYL) 500 MG tablet, Take 1 tablet (500 mg total) 3 (three) times daily by mouth. (Patient not taking: Reported on 09/09/2017), Disp: 30 tablet, Rfl: 0 .  ondansetron (ZOFRAN ODT) 4 MG disintegrating tablet, Take 1 tablet (4 mg  total) every 6 (six) hours as needed by mouth for nausea or vomiting. (Patient not taking: Reported on 09/09/2017), Disp: 20 tablet, Rfl: 0  Review of Systems  Constitutional: Positive for diaphoresis. Negative for appetite change, chills, fatigue and fever.  HENT: Positive for postnasal drip, rhinorrhea, sore throat (resolved) and tinnitus. Negative for ear pain.   Respiratory: Positive for cough, shortness of breath and wheezing. Negative for hemoptysis and chest tightness.   Cardiovascular: Positive for chest pain (when coughing). Negative for palpitations.  Gastrointestinal: Negative for abdominal pain, nausea and vomiting.  Musculoskeletal: Negative for myalgias.  Neurological: Positive for headaches. Negative for dizziness and weakness.    Social History   Tobacco Use  . Smoking status: Never Smoker  . Smokeless tobacco: Never Used  Substance Use Topics  . Alcohol use: No    Alcohol/week: 0.0 oz   Objective:   BP 138/82 (BP Location: Left Arm, Patient Position: Sitting, Cuff Size: Large)   Pulse (!) 103   Temp 99 F (37.2 C) (Oral)   Resp 18   Wt 211 lb (95.7 kg)   SpO2 97% Comment: room air  BMI 39.87 kg/m  There were no vitals filed for this visit.   Physical Exam  General Appearance:    Alert, cooperative, no distress  HENT:   bilateral TM normal without fluid or infection, throat normal without erythema or exudate and nasal  mucosa congested  Eyes:    PERRL, conjunctiva/corneas clear, EOM's intact       Lungs:     Occasional expiratory wheeze, no rales, , respirations unlabored  Heart:    Regular rate and rhythm  Neurologic:   Awake, alert, oriented x 3. No apparent focal neurological           defect.           Assessment & Plan:     1. Bronchitis Call if symptoms change or if not rapidly improving.     - azithromycin (ZITHROMAX) 250 MG tablet; 2 by mouth today, then 1 daily for 4 days  Dispense: 6 tablet; Refill: 0      The entirety of the  information documented in the History of Present Illness, Review of Systems and Physical Exam were personally obtained by me. Portions of this information were initially documented by Awilda Billoshena Chambers, CMA and reviewed by me for thoroughness and accuracy.    Mila Merryonald Brenn Gatton, MD  [facility]

## 2017-09-15 ENCOUNTER — Other Ambulatory Visit: Payer: Self-pay | Admitting: Family Medicine

## 2017-09-15 DIAGNOSIS — I10 Essential (primary) hypertension: Secondary | ICD-10-CM

## 2017-09-15 NOTE — Telephone Encounter (Signed)
Pharmacy requesting refills. Thanks!  

## 2017-10-15 ENCOUNTER — Emergency Department: Payer: Medicare Other

## 2017-10-15 ENCOUNTER — Emergency Department
Admission: EM | Admit: 2017-10-15 | Discharge: 2017-10-15 | Disposition: A | Discharge status: Home / Self Care | Payer: Medicare Other | Attending: Student in an Organized Health Care Education/Training Program | Admitting: Student in an Organized Health Care Education/Training Program

## 2017-10-15 ENCOUNTER — Other Ambulatory Visit: Payer: Self-pay

## 2017-10-15 ENCOUNTER — Encounter: Payer: Self-pay | Admitting: Emergency Medicine

## 2017-10-15 DIAGNOSIS — Z79899 Other long term (current) drug therapy: Secondary | ICD-10-CM | POA: Diagnosis not present

## 2017-10-15 DIAGNOSIS — R51 Headache: Secondary | ICD-10-CM | POA: Insufficient documentation

## 2017-10-15 DIAGNOSIS — Z96653 Presence of artificial knee joint, bilateral: Secondary | ICD-10-CM | POA: Insufficient documentation

## 2017-10-15 DIAGNOSIS — Z7982 Long term (current) use of aspirin: Secondary | ICD-10-CM | POA: Insufficient documentation

## 2017-10-15 DIAGNOSIS — Z8673 Personal history of transient ischemic attack (TIA), and cerebral infarction without residual deficits: Secondary | ICD-10-CM | POA: Insufficient documentation

## 2017-10-15 DIAGNOSIS — I10 Essential (primary) hypertension: Secondary | ICD-10-CM | POA: Diagnosis not present

## 2017-10-15 DIAGNOSIS — Z9049 Acquired absence of other specified parts of digestive tract: Secondary | ICD-10-CM | POA: Insufficient documentation

## 2017-10-15 DIAGNOSIS — R0602 Shortness of breath: Secondary | ICD-10-CM | POA: Diagnosis not present

## 2017-10-15 DIAGNOSIS — R519 Headache, unspecified: Secondary | ICD-10-CM

## 2017-10-15 LAB — CBC WITH DIFFERENTIAL/PLATELET
Basophils Absolute: 0 10*3/uL (ref 0–0.1)
Basophils Relative: 0 %
EOS ABS: 0.1 10*3/uL (ref 0–0.7)
EOS PCT: 1 %
HCT: 36.5 % (ref 35.0–47.0)
Hemoglobin: 12.3 g/dL (ref 12.0–16.0)
Lymphocytes Relative: 27 %
Lymphs Abs: 1.7 10*3/uL (ref 1.0–3.6)
MCH: 33.7 pg (ref 26.0–34.0)
MCHC: 33.8 g/dL (ref 32.0–36.0)
MCV: 99.8 fL (ref 80.0–100.0)
Monocytes Absolute: 0.7 10*3/uL (ref 0.2–0.9)
Monocytes Relative: 10 %
Neutro Abs: 3.9 10*3/uL (ref 1.4–6.5)
Neutrophils Relative %: 62 %
PLATELETS: 197 10*3/uL (ref 150–440)
RBC: 3.66 MIL/uL — AB (ref 3.80–5.20)
RDW: 13.5 % (ref 11.5–14.5)
WBC: 6.4 10*3/uL (ref 3.6–11.0)

## 2017-10-15 LAB — BASIC METABOLIC PANEL
Anion gap: 10 (ref 5–15)
BUN: 13 mg/dL (ref 6–20)
CHLORIDE: 104 mmol/L (ref 101–111)
CO2: 27 mmol/L (ref 22–32)
CREATININE: 0.81 mg/dL (ref 0.44–1.00)
Calcium: 8.7 mg/dL — ABNORMAL LOW (ref 8.9–10.3)
GFR calc Af Amer: >60 mL/min (ref >60)
GFR calc non Af Amer: >60 mL/min (ref >60)
Glucose, Bld: 96 mg/dL (ref 65–99)
Potassium: 3.7 mmol/L (ref 3.5–5.1)
SODIUM: 141 mmol/L (ref 135–145)

## 2017-10-15 LAB — TROPONIN I

## 2017-10-15 NOTE — ED Notes (Signed)
Pt reports that she is no longer having pain, or shortness of breath.  Pt reports that she is feeling better at this time.  Pt reports that she took and excedrin migraine PTA.  Pt refusing to allow this RN to stick viable veins in hand/wrist.  Pt states that it hurts too bad.  Pt also stating "If you cannot start one, get someone in here who can".  Attempted 2x IV's where pt states she is usually stuck with no success.  Jenel Lucksavid Walker, RN to attempt ultrasound IV.

## 2017-10-15 NOTE — ED Provider Notes (Signed)
[facility] Emergency Department Provider Note    None    (approximate)  I have reviewed the triage vital signs and the nursing notes.   HISTORY  Chief Complaint Hypertension; Headache; Chills; and Shortness of Breath    HPI Amy Ray is a 72 y.o. female with no recent admissions to the hospital presents with chief complaint of elevated blood pressure and headache epigastric discomfort and nausea that occurred starting earlier this morning.  Says she is also been quite shaky ".  She denies any chest pain or diaphoresis.  States that she took Excedrin Migraine with improvement in her symptoms.  The headache was not sudden onset.  No associated numbness or tingling.  Denies any shortness of breath or orthopnea.  No lower extremity swelling.  Past Medical History:  Diagnosis Date  . History of diverticulitis of colon 05/20/2015   per patient report   . Hypertension   . Stroke (HCC)   . TIA (transient ischemic attack)   . Vertigo    Family History  Problem Relation Age of Onset  . Heart attack Mother   . Hypertension Mother   . Alzheimer's disease Father   . Diabetes Father   . Hypertension Sister   . Diabetes Brother   . Hypertension Sister   . Hypertension Sister   . Hypertension Sister   . Hypertension Sister   . Breast cancer Sister [phone]  . Breast cancer Maternal Aunt [phone]   Past Surgical History:  Procedure Laterality Date  . CHOLECYSTECTOMY  2000  . HERNIA REPAIR     umbilical hernia as a child  . REPLACEMENT TOTAL KNEE Bilateral    Patient Active Problem List   Diagnosis Date Noted  . Low back pain 05/20/2015  . Fatigue 05/20/2015  . History of CVA (cerebrovascular accident) 05/20/2015  . Insomnia 05/20/2015  . Obesity 05/20/2015  . Positive H. pylori test 05/20/2015  . Tremor 05/20/2015  . Dysfunction of eustachian tube 05/20/2015  . Diverticulosis 05/20/2015  . Essential (primary) hypertension 07/17/2007  . Alterations of  sensations, late effect of cerebrovascular disease 03/11/2005  . Hyperlipidemia 10/12/2003  . Trigeminal neuralgia 10/12/2003      Prior to Admission medications   Medication Sig Start Date End Date Taking? Authorizing Provider  aspirin 81 MG tablet Take 1 tablet by mouth daily.    [provider]  carbamazepine (TEGRETOL) 200 MG tablet Take 1 tablet (200 mg total) by mouth 2 (two) times daily. 02/07/17   Malva LimesFisher, Donald E, MD  clopidogrel (PLAVIX) 75 MG tablet TAKE 1 TABLET(75 MG) BY MOUTH DAILY 01/01/17   Malva LimesFisher, Donald E, MD  DILT-XR 180 MG 24 hr capsule TAKE 2 CAPSULES(360 MG) BY MOUTH DAILY 04/11/17   Malva LimesFisher, Donald E, MD  hydrochlorothiazide (HYDRODIURIL) 12.5 MG tablet TAKE 1 TABLET(12.5 MG) BY MOUTH DAILY 09/15/17   Malva LimesFisher, Donald E, MD  IRON PO Take 1 tablet by mouth daily.    [provider]  metroNIDAZOLE (FLAGYL) 500 MG tablet Take 1 tablet (500 mg total) 3 (three) times daily by mouth. Patient not taking: Reported on 09/09/2017 08/28/17   Sharyn CreamerQuale, Mark, MD  nortriptyline (PAMELOR) 10 MG capsule TAKE 1 TO 2 CAPSULES BY  MOUTH AT BEDTIME 08/16/16   Malva LimesFisher, Donald E, MD  ondansetron (ZOFRAN ODT) 4 MG disintegrating tablet Take 1 tablet (4 mg total) every 6 (six) hours as needed by mouth for nausea or vomiting. Patient not taking: Reported on 09/09/2017 08/28/17   Sharyn CreamerQuale, Mark, MD  simvastatin (ZOCOR) 20 MG tablet TAKE 1 TABLET BY MOUTH  DAILY 09/07/17   Malva Limes, MD    Allergies Quinapril-hydrochlorothiazide and Tape    Social History Social History   Tobacco Use  . Smoking status: Never Smoker  . Smokeless tobacco: Never Used  Substance Use Topics  . Alcohol use: No    Alcohol/week: 0.0 oz  . Drug use: No    Review of Systems Patient denies headaches, rhinorrhea, blurry vision, numbness, shortness of breath, chest pain, edema, cough, abdominal pain, nausea, vomiting, diarrhea, dysuria, fevers, rashes or hallucinations unless otherwise stated above  in HPI. ____________________________________________   PHYSICAL EXAM:  VITAL SIGNS: Vitals:   10/15/17 1951  BP: (!) 179/107  Pulse: (!) 115  Resp: 18  Temp: 98.7 F (37.1 C)  SpO2: 99%    Constitutional: Alert and oriented. Well appearing and in no acute distress. Eyes: Conjunctivae are normal.  Head: Atraumatic. Nose: No congestion/rhinnorhea. Mouth/Throat: Mucous membranes are moist.   Neck: No stridor. Painless ROM.  Cardiovascular: Normal rate, regular rhythm. Grossly normal heart sounds.  Good peripheral circulation. Respiratory: Normal respiratory effort.  No retractions. Lungs CTAB. Gastrointestinal: Soft and nontender. No distention. No abdominal bruits. No CVA tenderness. Genitourinary:  Musculoskeletal: No lower extremity tenderness nor edema.  No joint effusions. Neurologic:  Normal speech and language. No gross focal neurologic deficits are appreciated. No facial droop Skin:  Skin is warm, dry and intact. No rash noted. Psychiatric: Mood and affect are normal. Speech and behavior are normal.  ____________________________________________   LABS (all labs ordered are listed, but only abnormal results are displayed)  No results found for this or any previous visit (from the past 24 hour(s)). ____________________________________________  EKG My review and personal interpretation at Time: 19:59   Indication: htn  Rate: 105  Rhythm: sinus Axis: normal  Other: normal intervals, nonspeicifc st change, no stemi ____________________________________________  RADIOLOGY  I personally reviewed all radiographic images ordered to evaluate for the above acute complaints and reviewed radiology reports and findings.  These findings were personally discussed with the patient.  Please see medical record for radiology report.  ____________________________________________   PROCEDURES  Procedure(s) performed:  Procedures    Critical Care performed:  no ____________________________________________   INITIAL IMPRESSION / ASSESSMENT AND PLAN / ED COURSE  Pertinent labs & imaging results that were available during my care of the patient were reviewed by me and considered in my medical decision making (see chart for details).  DDX: htnive urgency, chf, acs, sah, iph, malignant htn  Amy Ray is a 72 y.o. who presents to the ED with Non-distressed patient presenting with concern for elevated BP. Patient is AF,VSS with HTN in ED. Exam as above. Given current presentation have considered the above differential. no report of missed antihypertensive doses or medical non-compliance. no report of illicit drug use that could elevate BP. Extensive evaluation of possible end organ damage pursued in ED. no evidence of acute renal dysfunction. Neuro exam withno focal deficits. EKG without evidence of ischemia. Trop negative. Renal function nml. Not consistent with CHF, malignant htn, adrenergic crisis or hypertensive emergency.   CT head w/o evidence of IPH, SAH  Will have follow up with PCP for BP recheck. Discussed strict return parameters.       ____________________________________________   FINAL CLINICAL IMPRESSION(S) / ED DIAGNOSES  Final diagnoses:  Hypertension, unspecified type  Acute nonintractable headache, unspecified headache type      NEW MEDICATIONS STARTED DURING THIS VISIT:  This  SmartLink is deprecated. Use AVSMEDLIST instead to display the medication list for a patient.   Note:  This document was prepared using Dragon voice recognition software and may include unintentional dictation errors.    Willy Eddy, MD 10/15/17 2255

## 2017-10-15 NOTE — ED Triage Notes (Signed)
Patient with complaint of her bp being elevated, shortness of breath, headache and chills that started today. Patient states that her blood pressure is normally well controlled with her bp medications. Patient states that she did take her bp medications today. Patient also reports taking OTC medication for her headache with no improvement.

## 2017-10-15 NOTE — ED Notes (Signed)
Per EDP, okay to hold off on blood work and IV at this time until CT and CXR results are back.  Pt notified of this and okay with this.

## 2017-10-24 ENCOUNTER — Other Ambulatory Visit: Payer: Self-pay | Admitting: Family Medicine

## 2017-10-25 ENCOUNTER — Inpatient Hospital Stay: Payer: Medicare Other | Admitting: Family Medicine

## 2017-11-03 ENCOUNTER — Other Ambulatory Visit: Payer: Self-pay | Admitting: Family Medicine

## 2018-02-16 ENCOUNTER — Ambulatory Visit (INDEPENDENT_AMBULATORY_CARE_PROVIDER_SITE_OTHER): Payer: Medicare Other | Admitting: Family Medicine

## 2018-02-16 ENCOUNTER — Encounter: Payer: Self-pay | Admitting: Family Medicine

## 2018-02-16 VITALS — BP 140/88 | HR 90 | Temp 98.0°F | Resp 16 | Wt 208.0 lb

## 2018-02-16 DIAGNOSIS — E785 Hyperlipidemia, unspecified: Secondary | ICD-10-CM

## 2018-02-16 DIAGNOSIS — I10 Essential (primary) hypertension: Secondary | ICD-10-CM

## 2018-02-16 MED ORDER — HYDROCHLOROTHIAZIDE 12.5 MG PO TABS: ORAL_TABLET | ORAL | 5 refills | Status: DC

## 2018-02-16 NOTE — Patient Instructions (Addendum)
   The CDC recommends two doses of Shingrix (the shingles vaccine) separated by 2 to 6 months for adults age 71 years and older. I recommend checking with your insurance plan regarding coverage for this vaccine.     Start back on hctz 12.5mg  once a day. A prescription has been sent to [facility] in [place]

## 2018-02-16 NOTE — Progress Notes (Signed)
Patient: Amy Ray Female    DOB: [birth year]   72 y.o.   MRN: [id number] Visit Date: 02/16/2018  Today's Provider: Mila Merry, MD   Chief Complaint  Patient presents with  . Hypertension    follow up  . Hyperlipidemia    follow up   Subjective:    HPI   Hypertension, follow-up:  BP Readings from Last 3 Encounters:  10/15/17 (!) 145/75  09/09/17 138/82  08/28/17 (!) 145/78    She was last seen for hypertension 6 months ago.  BP at that visit was 124/80. Management since that visit includes; no changes.She reports good compliance with treatment. She is not having side effects.  She is exercising. She is adherent to low salt diet.   Outside blood pressures are checked and average 148/89. She is experiencing none.  Patient denies chest pain, chest pressure/discomfort, claudication, dyspnea, exertional chest pressure/discomfort, fatigue, irregular heart beat, lower extremity edema, near-syncope, orthopnea, palpitations, paroxysmal nocturnal dyspnea, syncope and tachypnea.   Cardiovascular risk factors include advanced age (older than 19 for men, 7 for women), dyslipidemia and hypertension.  Use of agents associated with hypertension: NSAIDS.   ------------------------------------------------------------------------    Lipid/Cholesterol, Follow-up:   Last seen for this 6 months ago.  Management since that visit includes; labs ordered, but zero were done.  Last Lipid Panel:    Component Value Date/Time   CHOL 162 07/12/2016 1010   TRIG 49 07/12/2016 1010   HDL 100 07/12/2016 1010   CHOLHDL 1.6 07/12/2016 1010   LDLCALC 52 07/12/2016 1010    She reports good compliance with treatment. She is not having side effects.   Wt Readings from Last 3 Encounters:  10/15/17 203 lb (92.1 kg)  09/09/17 211 lb (95.7 kg)  08/28/17 203 lb (92.1 kg)    ------------------------------------------------------------------------  States she had some abdominal  pains over the weekend which have mostly resolved, felt like previous episodes of diverticulitis. States is resolved now, but still a little nauseated.   Allergies  Allergen Reactions  . Quinapril-Hydrochlorothiazide Other (See Comments)    Other reaction(s): Headache  . Tape      Current Outpatient Medications:  .  ALPRAZolam (XANAX) 0.5 MG tablet, TAKE 1/2 TO 1 TABLET BY  MOUTH TWO TIMES DAILY, Disp: 90 tablet, Rfl: 3 .  aspirin 81 MG tablet, Take 1 tablet by mouth daily., Disp: , Rfl:  .  carbamazepine (TEGRETOL) 200 MG tablet, TAKE 1 TABLET BY MOUTH TWICE DAILY, Disp: 180 tablet, Rfl: 4 .  clopidogrel (PLAVIX) 75 MG tablet, TAKE 1 TABLET(75 MG) BY MOUTH DAILY, Disp: 90 tablet, Rfl: 4 .  DILT-XR 180 MG 24 hr capsule, TAKE 2 CAPSULES(360 MG) BY MOUTH DAILY, Disp: 60 capsule, Rfl: 12 .  hydrochlorothiazide (HYDRODIURIL) 12.5 MG tablet, TAKE 1 TABLET(12.5 MG) BY MOUTH DAILY, Disp: 30 tablet, Rfl: 12 .  IRON PO, Take 1 tablet by mouth daily., Disp: , Rfl:  .  nortriptyline (PAMELOR) 10 MG capsule, TAKE 1 TO 2 CAPSULES BY  MOUTH AT BEDTIME, Disp: 180 capsule, Rfl: 4 .  simvastatin (ZOCOR) 20 MG tablet, TAKE 1 TABLET BY MOUTH  DAILY, Disp: 90 tablet, Rfl: 4 .  ondansetron (ZOFRAN ODT) 4 MG disintegrating tablet, Take 1 tablet (4 mg total) every 6 (six) hours as needed by mouth for nausea or vomiting. (Patient not taking: Reported on 02/16/2018), Disp: 20 tablet, Rfl: 0  Review of Systems  Constitutional: Negative for appetite change, chills, fatigue and fever.  Respiratory:  Negative for chest tightness and shortness of breath.   Cardiovascular: Negative for chest pain and palpitations.  Gastrointestinal: Positive for abdominal pain (x 4 days; resolved) and nausea (x 4 days; improved). Negative for vomiting.  Neurological: Negative for dizziness and weakness.    Social History   Tobacco Use  . Smoking status: Never Smoker  . Smokeless tobacco: Never Used  Substance Use Topics  . Alcohol  use: No    Alcohol/week: 0.0 oz   Objective:   BP 140/88 (BP Location: Right Arm, Cuff Size: Large)   Pulse 90   Temp 98 F (36.7 C) (Oral)   Resp 16   Wt 208 lb (94.3 kg)   SpO2 97% Comment: room air  BMI 39.30 kg/m  Vitals:   02/16/18 0938 02/16/18 0944  BP: (!) 144/98 140/88  Pulse: 90   Resp: 16   Temp: 98 F (36.7 C)   TempSrc: Oral   SpO2: 97%   Weight: 208 lb (94.3 kg)      Physical Exam  General Appearance:    Alert, cooperative, no distress, obese  Eyes:    PERRL, conjunctiva/corneas clear, EOM's intact       Lungs:     Clear to auscultation bilaterally, respirations unlabored  Heart:    Regular rate and rhythm  Neurologic:   Awake, alert, oriented x 3. No apparent focal neurological           defect.           Assessment & Plan:     1. Essential (primary) hypertension BP mildly elevated. Work on [employer], Continue current medications.   - hydrochlorothiazide (HYDRODIURIL) 12.5 MG tablet; TAKE 1 TABLET(12.5 MG) BY MOUTH DAILY  Dispense: 30 tablet; Refill: 5  2. Hyperlipidemia, unspecified hyperlipidemia type She is tolerating simvastatin well with no adverse effects.   - Lipid panel       Mila Merry, MD  [facility]

## 2018-02-17 ENCOUNTER — Emergency Department
Admission: EM | Admit: 2018-02-17 | Discharge: 2018-02-17 | Disposition: A | Discharge status: Home / Self Care | Payer: Medicare Other | Attending: Emergency Medicine | Admitting: Emergency Medicine

## 2018-02-17 ENCOUNTER — Encounter: Payer: Self-pay | Admitting: Emergency Medicine

## 2018-02-17 ENCOUNTER — Telehealth: Payer: Self-pay | Admitting: Emergency Medicine

## 2018-02-17 ENCOUNTER — Other Ambulatory Visit: Payer: Self-pay

## 2018-02-17 DIAGNOSIS — Z7982 Long term (current) use of aspirin: Secondary | ICD-10-CM | POA: Diagnosis not present

## 2018-02-17 DIAGNOSIS — Z7902 Long term (current) use of antithrombotics/antiplatelets: Secondary | ICD-10-CM | POA: Insufficient documentation

## 2018-02-17 DIAGNOSIS — R109 Unspecified abdominal pain: Secondary | ICD-10-CM | POA: Diagnosis not present

## 2018-02-17 DIAGNOSIS — Z96653 Presence of artificial knee joint, bilateral: Secondary | ICD-10-CM | POA: Diagnosis not present

## 2018-02-17 DIAGNOSIS — Z8673 Personal history of transient ischemic attack (TIA), and cerebral infarction without residual deficits: Secondary | ICD-10-CM | POA: Insufficient documentation

## 2018-02-17 DIAGNOSIS — Z9049 Acquired absence of other specified parts of digestive tract: Secondary | ICD-10-CM | POA: Insufficient documentation

## 2018-02-17 DIAGNOSIS — Z79899 Other long term (current) drug therapy: Secondary | ICD-10-CM | POA: Insufficient documentation

## 2018-02-17 DIAGNOSIS — I10 Essential (primary) hypertension: Secondary | ICD-10-CM | POA: Insufficient documentation

## 2018-02-17 DIAGNOSIS — R110 Nausea: Secondary | ICD-10-CM | POA: Insufficient documentation

## 2018-02-17 LAB — COMPREHENSIVE METABOLIC PANEL
ALBUMIN: 4 g/dL (ref 3.5–5.0)
ALK PHOS: 105 U/L (ref 38–126)
ALT: 29 U/L (ref 14–54)
AST: 35 U/L (ref 15–41)
Anion gap: 8 (ref 5–15)
BILIRUBIN TOTAL: 0.7 mg/dL (ref 0.3–1.2)
BUN: 17 mg/dL (ref 6–20)
CALCIUM: 8.7 mg/dL — AB (ref 8.9–10.3)
CO2: 24 mmol/L (ref 22–32)
CREATININE: 1.14 mg/dL — AB (ref 0.44–1.00)
Chloride: 104 mmol/L (ref 101–111)
GFR calc Af Amer: 55 mL/min — ABNORMAL LOW (ref >60)
GFR, EST NON AFRICAN AMERICAN: 48 mL/min — AB (ref >60)
GLUCOSE: 77 mg/dL (ref 65–99)
POTASSIUM: 4 mmol/L (ref 3.5–5.1)
Sodium: 136 mmol/L (ref 135–145)
TOTAL PROTEIN: 8 g/dL (ref 6.5–8.1)

## 2018-02-17 LAB — CBC
HEMATOCRIT: 40.6 % (ref 35.0–47.0)
Hemoglobin: 13.9 g/dL (ref 12.0–16.0)
MCH: 33.7 pg (ref 26.0–34.0)
MCHC: 34.1 g/dL (ref 32.0–36.0)
MCV: 98.7 fL (ref 80.0–100.0)
PLATELETS: 158 10*3/uL (ref 150–440)
RBC: 4.11 MIL/uL (ref 3.80–5.20)
RDW: 12.8 % (ref 11.5–14.5)
WBC: 4.5 10*3/uL (ref 3.6–11.0)

## 2018-02-17 LAB — LIPID PANEL
CHOL/HDL RATIO: 1.9 ratio (ref 0.0–4.4)
Cholesterol, Total: 192 mg/dL (ref 100–199)
HDL: 100 mg/dL (ref >39)
LDL CALC: 74 mg/dL (ref 0–99)
Triglycerides: 89 mg/dL (ref 0–149)
VLDL Cholesterol Cal: 18 mg/dL (ref 5–40)

## 2018-02-17 LAB — LIPASE, BLOOD: Lipase: 25 U/L (ref 11–51)

## 2018-02-17 MED ORDER — ONDANSETRON HCL 4 MG PO TABS: 4.0000 mg | ORAL_TABLET | Freq: Three times a day (TID) | ORAL | 0 refills | Status: DC | PRN

## 2018-02-17 MED ORDER — DICYCLOMINE HCL 20 MG PO TABS: 20.0000 mg | ORAL_TABLET | Freq: Three times a day (TID) | ORAL | 0 refills | Status: DC | PRN

## 2018-02-17 MED ORDER — AMOXICILLIN-POT CLAVULANATE ER 1000-62.5 MG PO TB12: 1.0000 | ORAL_TABLET | Freq: Two times a day (BID) | ORAL | 0 refills | Status: AC

## 2018-02-17 NOTE — ED Provider Notes (Signed)
[facility] Emergency Department Provider Note   ____________________________________________   I have reviewed the triage vital signs and the nursing notes.   HISTORY  Chief Complaint Abdominal Pain and Nausea   History limited by: Not Limited   HPI Amy Ray is a 72 y.o. female who presents to the emergency department today because of concerns for abdominal pain.  It started 5 days ago.  She states it comes and goes.  Is located primarily in the left side of her abdomen.  It started primarily in the center.  The patient states that it feels similar to when she has had diverticulitis in the past.  She has had some associated nausea.  She denies any fevers.   Per medical record review patient has a history of diverticulitis.   Past Medical History:  Diagnosis Date  . History of diverticulitis of colon 05/20/2015   per patient report   . Hypertension   . Stroke (HCC)   . TIA (transient ischemic attack)   . Vertigo     Patient Active Problem List   Diagnosis Date Noted  . Low back pain 05/20/2015  . Fatigue 05/20/2015  . History of CVA (cerebrovascular accident) 05/20/2015  . Insomnia 05/20/2015  . Obesity 05/20/2015  . Positive H. pylori test 05/20/2015  . Tremor 05/20/2015  . Dysfunction of eustachian tube 05/20/2015  . Diverticulosis 05/20/2015  . Essential (primary) hypertension 07/17/2007  . Alterations of sensations, late effect of cerebrovascular disease 03/11/2005  . Hyperlipidemia 10/12/2003  . Trigeminal neuralgia 10/12/2003    Past Surgical History:  Procedure Laterality Date  . CHOLECYSTECTOMY  2000  . HERNIA REPAIR     umbilical hernia as a child  . REPLACEMENT TOTAL KNEE Bilateral     Prior to Admission medications   Medication Sig Start Date End Date Taking? Authorizing Provider  ALPRAZolam [name]) 0.5 MG tablet TAKE 1/2 TO 1 TABLET BY  MOUTH TWO TIMES DAILY 10/24/17   Malva Limes, MD  aspirin 81 MG tablet Take 1  tablet by mouth daily.    [provider]  carbamazepine (TEGRETOL) 200 MG tablet TAKE 1 TABLET BY MOUTH TWICE DAILY 11/03/17   Malva Limes, MD  clopidogrel (PLAVIX) 75 MG tablet TAKE 1 TABLET(75 MG) BY MOUTH DAILY 11/03/17   Malva Limes, MD  DILT-XR 180 MG 24 hr capsule TAKE 2 CAPSULES(360 MG) BY MOUTH DAILY 04/11/17   Malva Limes, MD  hydrochlorothiazide (HYDRODIURIL) 12.5 MG tablet TAKE 1 TABLET(12.5 MG) BY MOUTH DAILY 02/16/18   Malva Limes, MD  IRON PO Take 1 tablet by mouth daily.    [provider]  nortriptyline (PAMELOR) 10 MG capsule TAKE 1 TO 2 CAPSULES BY  MOUTH AT BEDTIME 10/24/17   Malva Limes, MD  simvastatin (ZOCOR) 20 MG tablet TAKE 1 TABLET BY MOUTH  DAILY 09/07/17   Malva Limes, MD    Allergies Quinapril-hydrochlorothiazide and Tape  Family History  Problem Relation Age of Onset  . Heart attack Mother   . Hypertension Mother   . Alzheimer's disease Father   . Diabetes Father   . Hypertension Sister   . Diabetes Brother   . Hypertension Sister   . Hypertension Sister   . Hypertension Sister   . Hypertension Sister   . Breast cancer Sister 18  . Breast cancer Maternal Aunt 70    Social History Social History   Tobacco Use  . Smoking status: Never Smoker  . Smokeless tobacco:  Never Used  Substance Use Topics  . Alcohol use: No    Alcohol/week: 0.0 oz  . Drug use: No    Review of Systems Constitutional: No fever/chills Eyes: No visual changes. ENT: No sore throat. Cardiovascular: Denies chest pain. Respiratory: Denies shortness of breath. Gastrointestinal: Positive for abdominal pain and nausea.  Genitourinary: Negative for dysuria. Musculoskeletal: Negative for back pain. Skin: Negative for rash. Neurological: Negative for headaches, focal weakness or numbness.  ____________________________________________   PHYSICAL EXAM:  VITAL SIGNS: ED Triage Vitals  Enc Vitals Group     BP 02/17/18 1453 (!)  178/81     Pulse Rate 02/17/18 1453 100     Resp 02/17/18 1453 16     Temp 02/17/18 1453 98.9 F (37.2 C)     Temp Source 02/17/18 1453 Oral     SpO2 02/17/18 1453 97 %     Weight 02/17/18 1454 208 lb (94.3 kg)     Height --      Head Circumference --      Peak Flow --      Pain Score 02/17/18 1454 3   Constitutional: Alert and oriented. Well appearing and in no distress. Eyes: Conjunctivae are normal.  ENT   Head: Normocephalic and atraumatic.   Nose: No congestion/rhinnorhea.   Mouth/Throat: Mucous membranes are moist.   Neck: No stridor. Hematological/Lymphatic/Immunilogical: No cervical lymphadenopathy. Cardiovascular: Normal rate, regular rhythm.  No murmurs, rubs, or gallops.  Respiratory: Normal respiratory effort without tachypnea nor retractions. Breath sounds are clear and equal bilaterally. No wheezes/rales/rhonchi. Gastrointestinal: Soft and non tender. No rebound. No guarding.  Genitourinary: Deferred Musculoskeletal: Normal range of motion in all extremities. No lower extremity edema. Neurologic:  Normal speech and language. No gross focal neurologic deficits are appreciated.  Skin:  Skin is warm, dry and intact. No rash noted. Psychiatric: Mood and affect are normal. Speech and behavior are normal. Patient exhibits appropriate insight and judgment.  ____________________________________________    LABS (pertinent positives/negatives)  Lipase 25 CMP na 136, k 4.0, cr 1.14 CBC wnl  ____________________________________________   EKG  None  ____________________________________________    RADIOLOGY  None  ____________________________________________   PROCEDURES  Procedures  ____________________________________________   INITIAL IMPRESSION / ASSESSMENT AND PLAN / ED COURSE  Pertinent labs & imaging results that were available during my care of the patient were reviewed by me and considered in my medical decision making (see chart  for details).  Patient presented to the emergency department today because of concerns for abdominal pain for 5 days since department.  Differential would be broad including gastroenteritis, gallbladder disease, appendicitis, diverticulitis, food poisoning, IBS amongst other etiologies.  Blood work without any overly concerning findings.  Abdominal exam is benign.  At this point given the patient states it feels like her previous diverticulitis would consider that is possibility.  This point do not feel any emergent imaging is necessary given lack of abdominal tenderness.  Will however give patient prescription for antibiotics, Bentyl and nausea medication. Discussed findings and plan with patient.   ____________________________________________   FINAL CLINICAL IMPRESSION(S) / ED DIAGNOSES  Final diagnoses:  Abdominal pain, unspecified abdominal location     Note: This dictation was prepared with Dragon dictation. Any transcriptional errors that result from this process are unintentional     Phineas Semen, MD 02/17/18 1753

## 2018-02-17 NOTE — Telephone Encounter (Signed)
-----   Message from Malva Limes, MD sent at 02/17/2018  7:46 AM EDT ----- Cholesterol is up a little bit from 162 to 192. Try to cut back on fatty foods. Continue current medications. Check yearly.

## 2018-02-17 NOTE — Discharge Instructions (Addendum)
Please seek medical attention for any high fevers, chest pain, shortness of breath, change in behavior, persistent vomiting, bloody stool or any other new or concerning symptoms.  

## 2018-02-17 NOTE — ED Triage Notes (Signed)
Pt to ED via POV c/o abdominal pain and nausea since Sunday. Pt states that she is able to eat. Pt states that her abdominal pain gets worse after eating or drinking. Pt denies vomiting or diarrhea.

## 2018-02-17 NOTE — ED Notes (Signed)
Lab draw attempted x 1 by this RN.

## 2018-02-17 NOTE — Telephone Encounter (Signed)
LMTCB on both numbers in chart.

## 2018-02-20 DIAGNOSIS — R1031 Right lower quadrant pain: Secondary | ICD-10-CM | POA: Diagnosis not present

## 2018-02-20 DIAGNOSIS — Z6838 Body mass index (BMI) 38.0-38.9, adult: Secondary | ICD-10-CM | POA: Diagnosis not present

## 2018-02-20 DIAGNOSIS — R1030 Lower abdominal pain, unspecified: Secondary | ICD-10-CM | POA: Diagnosis not present

## 2018-02-21 NOTE — Telephone Encounter (Signed)
[id number] 

## 2018-02-21 NOTE — Telephone Encounter (Signed)
Pt returned call for lab results. Please advise. Thanks TNP °

## 2018-02-23 NOTE — Telephone Encounter (Signed)
Left message with pt's husband to return call

## 2018-02-23 NOTE — Telephone Encounter (Signed)
Advised patient of results.  

## 2018-02-28 ENCOUNTER — Ambulatory Visit (INDEPENDENT_AMBULATORY_CARE_PROVIDER_SITE_OTHER): Payer: Medicare Other | Admitting: Family Medicine

## 2018-02-28 ENCOUNTER — Encounter: Payer: Self-pay | Admitting: Family Medicine

## 2018-02-28 VITALS — BP 122/72 | HR 84 | Temp 97.5°F | Resp 16 | Wt 206.0 lb

## 2018-02-28 DIAGNOSIS — R109 Unspecified abdominal pain: Secondary | ICD-10-CM | POA: Diagnosis not present

## 2018-02-28 NOTE — Progress Notes (Signed)
Patient: Amy Ray Female    DOB: 05-18-1946   72 y.o.   MRN: [id number] Visit Date: 02/28/2018  Today's Provider: Mila Merry, MD   Chief Complaint  Patient presents with  . Follow-up   Subjective:    HPI   Follow up ER visit  Patient was seen in ER for abdominal pain on 02/17/2018. She was treated for suspected diverticulitis. Treatment for this included; rx for Augmentin, dicyclomine and nausea medication Zofran. She states she developed rash, then was then seen at [facility] ER on 02/20/2018 for lower abdominal pain. she had unremarkable abdominal CT.  Patient was found to have a UTI and was started on Macrobid. Urine culture grew mixed UG flora, but she had been on Augmentin prior to urine collection.  She reports good compliance with treatment. Today patient comes in reporting that her abdominal pain has resolved. She has been feeling off balanced when she walks. She has had heart burn which improves after taking Nexium. She reports occasionally experiencing some tingling in her legs  ------------------------------------------------------------------------------------    Allergies  Allergen Reactions  . Quinapril-Hydrochlorothiazide Other (See Comments)    Other reaction(s): Headache  . Tape      Current Outpatient Medications:  .  ALPRAZolam (XANAX) 0.5 MG tablet, TAKE 1/2 TO 1 TABLET BY  MOUTH TWO TIMES DAILY, Disp: 90 tablet, Rfl: 3 .  aspirin 81 MG tablet, Take 1 tablet by mouth daily., Disp: , Rfl:  .  carbamazepine (TEGRETOL) 200 MG tablet, TAKE 1 TABLET BY MOUTH TWICE DAILY, Disp: 180 tablet, Rfl: 4 .  clopidogrel (PLAVIX) 75 MG tablet, TAKE 1 TABLET(75 MG) BY MOUTH DAILY, Disp: 90 tablet, Rfl: 4 .  DILT-XR 180 MG 24 hr capsule, TAKE 2 CAPSULES(360 MG) BY MOUTH DAILY, Disp: 60 capsule, Rfl: 12 .  esomeprazole (NEXIUM) 20 MG capsule, Take 20 mg by mouth daily at 12 noon., Disp: , Rfl:  .  hydrochlorothiazide (HYDRODIURIL) 12.5 MG tablet, TAKE 1 TABLET(12.5  MG) BY MOUTH DAILY, Disp: 30 tablet, Rfl: 5 .  IRON PO, Take 1 tablet by mouth daily., Disp: , Rfl:  .  nortriptyline (PAMELOR) 10 MG capsule, TAKE 1 TO 2 CAPSULES BY  MOUTH AT BEDTIME, Disp: 180 capsule, Rfl: 4 .  ondansetron (ZOFRAN) 4 MG tablet, Take 1 tablet (4 mg total) by mouth every 8 (eight) hours as needed for nausea or vomiting., Disp: 20 tablet, Rfl: 0 .  simvastatin (ZOCOR) 20 MG tablet, TAKE 1 TABLET BY MOUTH  DAILY, Disp: 90 tablet, Rfl: 4 .  dicyclomine (BENTYL) 20 MG tablet, Take 1 tablet (20 mg total) by mouth 3 (three) times daily as needed (abdominal pain). (Patient not taking: Reported on 02/28/2018), Disp: 30 tablet, Rfl: 0  Review of Systems  Constitutional: Positive for fatigue. Negative for appetite change, chills and fever.  Respiratory: Negative for chest tightness and shortness of breath.   Cardiovascular: Negative for chest pain and palpitations.  Gastrointestinal: Negative for abdominal pain, nausea and vomiting.       Heartburn  Neurological: Positive for weakness (in legs). Negative for dizziness.       Off  Balance when walking    Social History   Tobacco Use  . Smoking status: Never Smoker  . Smokeless tobacco: Never Used  Substance Use Topics  . Alcohol use: No    Alcohol/week: 0.0 oz   Objective:   BP 122/72 (BP Location: Left Arm, Patient Position: Sitting, Cuff Size: Large)   Pulse 84  Temp (!) 97.5 F (36.4 C) (Oral)   Resp 16   Wt 206 lb (93.4 kg)   SpO2 99% Comment: room air  BMI 38.92 kg/m  There were no vitals filed for this visit.   Physical Exam  General Appearance:    Alert, cooperative, no distress, obese  Eyes:    PERRL, conjunctiva/corneas clear, EOM's intact       Lungs:     Clear to auscultation bilaterally, respirations unlabored  Heart:    Regular rate and rhythm  Abdomen:   bowel sounds present and normal in all 4 quadrants, soft, round or nontender. No CVA tenderness        Assessment & Plan:     1. Abdominal  pain, unspecified abdominal location Resolved since starting antibiotic and Nexium. Unclear if sx due to uti, diverticulitiss or gastritis, or if mult-factorial. She is to finish 14 day course of Nexium. Call if sx return.       Mila Merry, MD  [facility]

## 2018-03-14 DIAGNOSIS — Z8673 Personal history of transient ischemic attack (TIA), and cerebral infarction without residual deficits: Secondary | ICD-10-CM | POA: Diagnosis not present

## 2018-03-14 DIAGNOSIS — I208 Other forms of angina pectoris: Secondary | ICD-10-CM | POA: Diagnosis not present

## 2018-03-14 DIAGNOSIS — I639 Cerebral infarction, unspecified: Secondary | ICD-10-CM | POA: Diagnosis not present

## 2018-03-14 DIAGNOSIS — E782 Mixed hyperlipidemia: Secondary | ICD-10-CM | POA: Diagnosis not present

## 2018-03-14 DIAGNOSIS — M1990 Unspecified osteoarthritis, unspecified site: Secondary | ICD-10-CM | POA: Diagnosis not present

## 2018-03-14 DIAGNOSIS — M170 Bilateral primary osteoarthritis of knee: Secondary | ICD-10-CM | POA: Diagnosis not present

## 2018-03-14 DIAGNOSIS — R51 Headache: Secondary | ICD-10-CM | POA: Diagnosis not present

## 2018-03-14 DIAGNOSIS — Z6841 Body Mass Index (BMI) 40.0 and over, adult: Secondary | ICD-10-CM | POA: Diagnosis not present

## 2018-03-14 DIAGNOSIS — R011 Cardiac murmur, unspecified: Secondary | ICD-10-CM | POA: Diagnosis not present

## 2018-03-14 DIAGNOSIS — I10 Essential (primary) hypertension: Secondary | ICD-10-CM | POA: Diagnosis not present

## 2018-03-27 ENCOUNTER — Other Ambulatory Visit: Payer: Self-pay | Admitting: Family Medicine

## 2018-03-27 DIAGNOSIS — Z1231 Encounter for screening mammogram for malignant neoplasm of breast: Secondary | ICD-10-CM

## 2018-04-12 ENCOUNTER — Other Ambulatory Visit: Payer: Self-pay | Admitting: Family Medicine

## 2018-05-02 ENCOUNTER — Other Ambulatory Visit: Payer: Self-pay | Admitting: Family Medicine

## 2018-05-02 NOTE — Telephone Encounter (Signed)
[facility] faxed a refill request for the following medication. Thanks CC  ALPRAZolam (XANAX) 0.5 MG tablet

## 2018-05-03 MED ORDER — ALPRAZOLAM 0.5 MG PO TABS: ORAL_TABLET | ORAL | 3 refills | Status: DC

## 2018-05-08 ENCOUNTER — Ambulatory Visit
Admission: RE | Admit: 2018-05-08 | Discharge: 2018-05-08 | Disposition: A | Discharge status: Home / Self Care | Payer: Medicare Other | Source: Ambulatory Visit | Attending: Family Medicine | Admitting: Family Medicine

## 2018-05-08 DIAGNOSIS — Z1231 Encounter for screening mammogram for malignant neoplasm of breast: Secondary | ICD-10-CM | POA: Insufficient documentation

## 2018-06-07 ENCOUNTER — Telehealth: Payer: Self-pay | Admitting: Family Medicine

## 2018-06-07 NOTE — Telephone Encounter (Signed)
I left a message on both home and mobile numbers asking the pt to call me at [phone] about her 06/23/18 appt.  Pt is scheduled with nurse, not nurse health advisor, and [facility] doesn't have an opening at the same time.  So, she will need to schedule her AWV w/ [facility] on either 9/10, 9/11 or 9/12. VDM (DD)

## 2018-06-09 NOTE — Telephone Encounter (Signed)
Moved AWV to 06/21/18 @ 11:20 AM on correct [facility] schedule. -MM

## 2018-06-20 DIAGNOSIS — Z96612 Presence of left artificial shoulder joint: Secondary | ICD-10-CM | POA: Diagnosis not present

## 2018-06-20 DIAGNOSIS — Z96619 Presence of unspecified artificial shoulder joint: Secondary | ICD-10-CM | POA: Diagnosis not present

## 2018-06-20 DIAGNOSIS — M19011 Primary osteoarthritis, right shoulder: Secondary | ICD-10-CM | POA: Diagnosis not present

## 2018-06-20 DIAGNOSIS — Z96653 Presence of artificial knee joint, bilateral: Secondary | ICD-10-CM | POA: Insufficient documentation

## 2018-06-21 ENCOUNTER — Ambulatory Visit (INDEPENDENT_AMBULATORY_CARE_PROVIDER_SITE_OTHER): Payer: Medicare Other

## 2018-06-21 VITALS — BP 132/76 | HR 84 | Temp 98.5°F | Ht 60.0 in | Wt 206.8 lb

## 2018-06-21 DIAGNOSIS — Z23 Encounter for immunization: Secondary | ICD-10-CM

## 2018-06-21 DIAGNOSIS — Z0000 Encounter for general adult medical examination without abnormal findings: Secondary | ICD-10-CM | POA: Diagnosis not present

## 2018-06-21 NOTE — Patient Instructions (Signed)
Amy Ray , Thank you for taking time to come for your Medicare Wellness Visit. I appreciate your ongoing commitment to your health goals. Please review the following plan we discussed and let me know if I can assist you in the future.   Screening recommendations/referrals: Colonoscopy: Up to date Mammogram: Up to date Bone Density: Up to date Recommended yearly ophthalmology/optometry visit for glaucoma screening and checkup Recommended yearly dental visit for hygiene and checkup  Vaccinations: Influenza vaccine: Up to date Pneumococcal vaccine: Up to date Tdap vaccine: Up to date Shingles vaccine: Pt declines today.     Advanced directives: Advance directive discussed with you today. Even though you declined this today please call our office should you change your mind and we can give you the proper paperwork for you to fill out.  Conditions/risks identified: Fall risk prevention; Obesity- recommend increasing amount of vegetables to 2-3 servings a day.  Next appointment: 06/23/18 @ 9:40 AM with Dr Sherrie Mustache.    Preventive Care 41 Years and Older, Female Preventive care refers to lifestyle choices and visits with your health care provider that can promote health and wellness. What does preventive care include?  A yearly physical exam. This is also called an annual well check.  Dental exams once or twice a year.  Routine eye exams. Ask your health care provider how often you should have your eyes checked.  Personal lifestyle choices, including:  Daily care of your teeth and gums.  Regular physical activity.  Eating a healthy diet.  Avoiding tobacco and drug use.  Limiting alcohol use.  Practicing safe sex.  Taking low-dose aspirin every day.  Taking vitamin and mineral supplements as recommended by your health care provider. What happens during an annual well check? The services and screenings done by your health care provider during your annual well check will  depend on your age, overall health, lifestyle risk factors, and family history of disease. Counseling  Your health care provider may ask you questions about your:  Alcohol use.  Tobacco use.  Drug use.  Emotional well-being.  Home and relationship well-being.  Sexual activity.  Eating habits.  History of falls.  Memory and ability to understand (cognition).  Work and work Astronomer.  Reproductive health. Screening  You may have the following tests or measurements:  Height, weight, and BMI.  Blood pressure.  Lipid and cholesterol levels. These may be checked every 5 years, or more frequently if you are over 25 years old.  Skin check.  Lung cancer screening. You may have this screening every year starting at age 26 if you have a 30-pack-year history of smoking and currently smoke or have quit within the past 15 years.  Fecal occult blood test (FOBT) of the stool. You may have this test every year starting at age 32.  Flexible sigmoidoscopy or colonoscopy. You may have a sigmoidoscopy every 5 years or a colonoscopy every 10 years starting at age 14.  Hepatitis C blood test.  Hepatitis B blood test.  Sexually transmitted disease (STD) testing.  Diabetes screening. This is done by checking your blood sugar (glucose) after you have not eaten for a while (fasting). You may have this done every 1-3 years.  Bone density scan. This is done to screen for osteoporosis. You may have this done starting at age 71.  Mammogram. This may be done every 1-2 years. Talk to your health care provider about how often you should have regular mammograms. Talk with your health care provider about  your test results, treatment options, and if necessary, the need for more tests. Vaccines  Your health care provider may recommend certain vaccines, such as:  Influenza vaccine. This is recommended every year.  Tetanus, diphtheria, and acellular pertussis (Tdap, Td) vaccine. You may need a  Td booster every 10 years.  Zoster vaccine. You may need this after age 33.  Pneumococcal 13-valent conjugate (PCV13) vaccine. One dose is recommended after age 76.  Pneumococcal polysaccharide (PPSV23) vaccine. One dose is recommended after age 85. Talk to your health care provider about which screenings and vaccines you need and how often you need them. This information is not intended to replace advice given to you by your health care provider. Make sure you discuss any questions you have with your health care provider. Document Released: 10/24/2015 Document Revised: 06/16/2016 Document Reviewed: 07/29/2015 Elsevier Interactive Patient Education  2017 [place] Prevention in the Home Falls can cause injuries. They can happen to people of all ages. There are many things you can do to make your home safe and to help prevent falls. What can I do on the outside of my home?  Regularly fix the edges of walkways and driveways and fix any cracks.  Remove anything that might make you trip as you walk through a door, such as a raised step or threshold.  Trim any bushes or trees on the path to your home.  Use bright outdoor lighting.  Clear any walking paths of anything that might make someone trip, such as rocks or tools.  Regularly check to see if handrails are loose or broken. Make sure that both sides of any steps have handrails.  Any raised decks and porches should have guardrails on the edges.  Have any leaves, snow, or ice cleared regularly.  Use sand or salt on walking paths during winter.  Clean up any spills in your garage right away. This includes oil or grease spills. What can I do in the bathroom?  Use night lights.  Install grab bars by the toilet and in the tub and shower. Do not use towel bars as grab bars.  Use non-skid mats or decals in the tub or shower.  If you need to sit down in the shower, use a plastic, non-slip stool.  Keep the floor dry. Clean  up any water that spills on the floor as soon as it happens.  Remove soap buildup in the tub or shower regularly.  Attach bath mats securely with double-sided non-slip rug tape.  Do not have throw rugs and other things on the floor that can make you trip. What can I do in the bedroom?  Use night lights.  Make sure that you have a light by your bed that is easy to reach.  Do not use any sheets or blankets that are too big for your bed. They should not hang down onto the floor.  Have a firm chair that has side arms. You can use this for support while you get dressed.  Do not have throw rugs and other things on the floor that can make you trip. What can I do in the kitchen?  Clean up any spills right away.  Avoid walking on wet floors.  Keep items that you use a lot in easy-to-reach places.  If you need to reach something above you, use a strong step stool that has a grab bar.  Keep electrical cords out of the way.  Do not use floor polish or wax  that makes floors slippery. If you must use wax, use non-skid floor wax.  Do not have throw rugs and other things on the floor that can make you trip. What can I do with my stairs?  Do not leave any items on the stairs.  Make sure that there are handrails on both sides of the stairs and use them. Fix handrails that are broken or loose. Make sure that handrails are as long as the stairways.  Check any carpeting to make sure that it is firmly attached to the stairs. Fix any carpet that is loose or worn.  Avoid having throw rugs at the top or bottom of the stairs. If you do have throw rugs, attach them to the floor with carpet tape.  Make sure that you have a light switch at the top of the stairs and the bottom of the stairs. If you do not have them, ask someone to add them for you. What else can I do to help prevent falls?  Wear shoes that:  Do not have high heels.  Have rubber bottoms.  Are comfortable and fit you well.  Are  closed at the toe. Do not wear sandals.  If you use a stepladder:  Make sure that it is fully opened. Do not climb a closed stepladder.  Make sure that both sides of the stepladder are locked into place.  Ask someone to hold it for you, if possible.  Clearly mark and make sure that you can see:  Any grab bars or handrails.  First and last steps.  Where the edge of each step is.  Use tools that help you move around (mobility aids) if they are needed. These include:  Canes.  Walkers.  Scooters.  Crutches.  Turn on the lights when you go into a dark area. Replace any light bulbs as soon as they burn out.  Set up your furniture so you have a clear path. Avoid moving your furniture around.  If any of your floors are uneven, fix them.  If there are any pets around you, be aware of where they are.  Review your medicines with your doctor. Some medicines can make you feel dizzy. This can increase your chance of falling. Ask your doctor what other things that you can do to help prevent falls. This information is not intended to replace advice given to you by your health care provider. Make sure you discuss any questions you have with your health care provider. Document Released: 07/24/2009 Document Revised: 03/04/2016 Document Reviewed: 11/01/2014 Elsevier Interactive Patient Education  2017 [employer].

## 2018-06-21 NOTE — Progress Notes (Signed)
Subjective:   Amy Ray is a 72 y.o. female who presents for Medicare Annual (Subsequent) preventive examination.  Review of Systems:  N/A  Cardiac Risk Factors include: advanced age (>76men, >49 women);dyslipidemia;hypertension;obesity (BMI >30kg/m2)     Objective:     Vitals: BP 132/76 (BP Location: Right Arm)   Pulse 84   Temp 98.5 F (36.9 C) (Oral)   Ht 5' (1.524 m)   Wt 206 lb 12.8 oz (93.8 kg)   BMI 40.39 kg/m   Body mass index is 40.39 kg/m.  Advanced Directives 06/21/2018 10/15/2017 08/28/2017 06/16/2017 08/28/2016 09/08/2015 07/21/2015  Does Patient Have a Medical Advance Directive? No No No No No No No  Would patient like information on creating a medical advance directive? No - Patient declined - Yes (ED - Information included in AVS) No - Patient declined No - patient declined information No - patient declined information Yes - Transport planner given    Tobacco Social History   Tobacco Use  Smoking Status Never Smoker  Smokeless Tobacco Never Used     Counseling given: Not Answered   Clinical Intake:  Pre-visit preparation completed: Yes  Pain : 0-10 Pain Score: 4  Pain Type: Acute pain Pain Location: Foot Pain Orientation: Right Pain Descriptors / Indicators: Other (Comment)(shocking) Pain Onset: 1 to 4 weeks ago Pain Frequency: Constant     Nutritional Status: BMI > 30  Obese Nutritional Risks: None Diabetes: No  How often do you need to have someone help you when you read instructions, pamphlets, or other written materials from your doctor or pharmacy?: 1 - Never  Interpreter Needed?: No  Information entered by :: [facility], LPN  Past Medical History:  Diagnosis Date  . History of diverticulitis of colon 05/20/2015   per patient report   . Hyperlipidemia   . Hypertension   . Stroke (HCC)   . TIA (transient ischemic attack)   . Vertigo    Past Surgical History:  Procedure Laterality Date  . CHOLECYSTECTOMY  2000  . HERNIA  REPAIR     umbilical hernia as a child  . REPLACEMENT TOTAL KNEE Bilateral    Family History  Problem Relation Age of Onset  . Heart attack Mother   . Hypertension Mother   . Alzheimer's disease Father   . Diabetes Father   . Hypertension Sister   . Diabetes Brother   . Hypertension Sister   . Hypertension Sister   . Hypertension Sister   . Hypertension Sister   . Breast cancer Sister 32  . Breast cancer Maternal Aunt 38   Social History   Socioeconomic History  . Marital status: Married    Spouse name: Not on file  . Number of children: 4  . Years of education: Not on file  . Highest education level: High school graduate  Occupational History  . Occupation: Retired  Engineer, production  . Financial resource strain: Not hard at all  . Food insecurity:    Worry: Never true    Inability: Never true  . Transportation needs:    Medical: No    Non-medical: No  Tobacco Use  . Smoking status: Never Smoker  . Smokeless tobacco: Never Used  Substance and Sexual Activity  . Alcohol use: No    Alcohol/week: 0.0 standard drinks  . Drug use: No  . Sexual activity: Not on file  Lifestyle  . Physical activity:    Days per week: Not on file    Minutes per session: Not on  file  . Stress: Only a little  Relationships  . Social connections:    Talks on phone: Not on file    Gets together: Not on file    Attends religious service: Not on file    Active member of club or organization: Not on file    Attends meetings of clubs or organizations: Not on file    Relationship status: Not on file  Other Topics Concern  . Not on file  Social History Narrative  . Not on file    Outpatient Encounter Medications as of 06/21/2018  Medication Sig  . ALPRAZolam (XANAX) 0.5 MG tablet TAKE 1/2 TO 1 TABLET BY  MOUTH TWO TIMES DAILY  . aspirin 81 MG tablet Take 1 tablet by mouth daily.  . carbamazepine (TEGRETOL) 200 MG tablet TAKE 1 TABLET BY MOUTH TWICE DAILY  . clopidogrel (PLAVIX) 75 MG  tablet TAKE 1 TABLET(75 MG) BY MOUTH DAILY  . DILT-XR 180 MG 24 hr capsule TAKE 2 CAPSULES(360 MG) BY MOUTH DAILY  . hydrochlorothiazide (HYDRODIURIL) 12.5 MG tablet TAKE 1 TABLET(12.5 MG) BY MOUTH DAILY  . IRON PO Take 1 tablet by mouth daily.  . nortriptyline (PAMELOR) 10 MG capsule TAKE 1 TO 2 CAPSULES BY  MOUTH AT BEDTIME  . ondansetron (ZOFRAN) 4 MG tablet Take 1 tablet (4 mg total) by mouth every 8 (eight) hours as needed for nausea or vomiting.  . simvastatin (ZOCOR) 20 MG tablet TAKE 1 TABLET BY MOUTH  DAILY  . dicyclomine (BENTYL) 20 MG tablet Take 1 tablet (20 mg total) by mouth 3 (three) times daily as needed (abdominal pain). (Patient not taking: Reported on 02/28/2018)  . esomeprazole (NEXIUM) 20 MG capsule Take 20 mg by mouth daily at 12 noon.   No facility-administered encounter medications on file as of 06/21/2018.     Activities of Daily Living In your present state of health, do you have any difficulty performing the following activities: 06/21/2018  Hearing? N  Vision? Y  Comment Only at night when driving, currently has cataracts in both eyes.   Difficulty concentrating or making decisions? Y  Walking or climbing stairs? Y  Comment Avoids steps due to knee pain.   Dressing or bathing? N  Doing errands, shopping? N  Preparing Food and eating ? N  Using the Toilet? N  In the past six months, have you accidently leaked urine? N  Do you have problems with loss of bowel control? N  Managing your Medications? N  Managing your Finances? N  Housekeeping or managing your Housekeeping? N  Some recent data might be hidden    Patient Care Team: Malva Limes, MD as PCP - General (Family Medicine) Isla Pence, OD as Consulting Physician (Optometry) Deeann Saint, MD (Orthopedic Surgery) Alwyn Pea, MD as Consulting Physician (Cardiology)    Assessment:   This is a routine wellness examination for Amy Ray.  Exercise Activities and Dietary  recommendations Current Exercise Habits: Home exercise routine, Time (Minutes): 30, Frequency (Times/Week): 3, Weekly Exercise (Minutes/Week): 90, Intensity: Mild, Exercise limited by: orthopedic condition(s)  Goals    . DIET - EAT MORE VEGETABLES     Recommend increasing amount of vegetables to 2-3 servings a day.    . Increase lean proteins     Recommend to continue eating lean meats only and no fried meats.        Fall Risk Fall Risk  06/21/2018 06/16/2017 01/17/2017  Falls in the past year? Yes Yes Yes  Number  falls in past yr: 2 or more 2 or more 1  Injury with Fall? No No No  Risk for fall due to : - - History of fall(s)  Follow up Falls prevention discussed Falls prevention discussed Falls prevention discussed   Is the patient's home free of loose throw rugs in walkways, pet beds, electrical cords, etc?   yes      Grab bars in the bathroom? yes      Handrails on the stairs?   no      Adequate lighting?   yes  Timed Get Up and Go performed: N/A  Depression Screen PHQ 2/9 Scores 06/21/2018 06/16/2017 01/17/2017  PHQ - 2 Score 1 2 0  PHQ- 9 Score - 4 3     Cognitive Function: Pt declined screening today.         Immunization History  Administered Date(s) Administered  . Influenza, High Dose Seasonal PF 07/12/2016, 06/16/2017, 06/21/2018  . Influenza-Unspecified 05/12/2015  . Pneumococcal Conjugate-13 12/05/2014  . Pneumococcal Polysaccharide-23 05/09/2013  . Tdap 08/12/2011    Qualifies for Shingles Vaccine? Due for Shingles vaccine. Declined my offer to administer today. Education has been provided regarding the importance of this vaccine. Pt has been advised to call her insurance company to determine her out of pocket expense. Advised she may also receive this vaccine at her local pharmacy or Health Dept. Verbalized acceptance and understanding.  Screening Tests Health Maintenance  Topic Date Due  . Hepatitis C Screening  [birth year]  . DEXA SCAN  01/05/2019  .  COLONOSCOPY  06/26/2019  . MAMMOGRAM  05/08/2020  . TETANUS/TDAP  08/11/2021  . INFLUENZA VACCINE  Completed  . PNA vac Low Risk Adult  Completed    Cancer Screenings: Lung: Low Dose CT Chest recommended if Age 25-80 years, 30 pack-year currently smoking OR have quit w/in 15years. Patient does not qualify. Breast:  Up to date on Mammogram? Yes   Up to date of Bone Density/Dexa? Yes Colorectal: Up to date  Additional Screenings:  Hepatitis C Screening: Pt declines and states she plans to have this completed at an upcoming apt.     Plan:  I have personally reviewed and addressed the Medicare Annual Wellness questionnaire and have noted the following in the patient's chart:  A. Medical and social history B. Use of alcohol, tobacco or illicit drugs  C. Current medications and supplements D. Functional ability and status E.  Nutritional status F.  Physical activity G. Advance directives H. List of other physicians I.  Hospitalizations, surgeries, and ER visits in previous 12 months J.  Vitals K. Screenings such as hearing and vision if needed, cognitive and depression L. Referrals and appointments - none  In addition, I have reviewed and discussed with patient certain preventive protocols, quality metrics, and best practice recommendations. A written personalized care plan for preventive services as well as general preventive health recommendations were provided to patient.  See attached scanned questionnaire for additional information.   Signed,  Hyacinth Meeker, LPN Nurse Health Advisor   Nurse Recommendations: Pt plans to have blood work this month and check for Hep C.

## 2018-06-23 ENCOUNTER — Ambulatory Visit: Payer: Self-pay

## 2018-06-23 ENCOUNTER — Encounter: Payer: Self-pay | Admitting: Family Medicine

## 2018-06-23 ENCOUNTER — Ambulatory Visit (INDEPENDENT_AMBULATORY_CARE_PROVIDER_SITE_OTHER): Payer: Medicare Other | Admitting: Family Medicine

## 2018-06-23 VITALS — BP 141/80 | HR 83 | Temp 97.8°F | Resp 16 | Wt 206.0 lb

## 2018-06-23 DIAGNOSIS — G500 Trigeminal neuralgia: Secondary | ICD-10-CM | POA: Diagnosis not present

## 2018-06-23 DIAGNOSIS — E785 Hyperlipidemia, unspecified: Secondary | ICD-10-CM

## 2018-06-23 DIAGNOSIS — Z1159 Encounter for screening for other viral diseases: Secondary | ICD-10-CM

## 2018-06-23 DIAGNOSIS — R000 Tachycardia, unspecified: Secondary | ICD-10-CM

## 2018-06-23 DIAGNOSIS — I10 Essential (primary) hypertension: Secondary | ICD-10-CM

## 2018-06-23 NOTE — Progress Notes (Signed)
Patient: Amy Ray Female    DOB: 10/02/1946   72 y.o.   MRN: [id number] Visit Date: 06/23/2018  Today's Provider: Mila Merry, MD   Chief Complaint  Patient presents with  . Follow-up  . Hypertension  . Hyperlipidemia   Subjective:   Patient saw [facility] for AWV on 06/21/2018.  HPI   Hypertension, follow-up:  BP Readings from Last 3 Encounters:  06/23/18 (!) 141/80  06/21/18 132/76  02/28/18 122/72    She was last seen for hypertension 4 months ago.  BP at that visit was 140/88. Management since that visit includes; advised to work on improving diet.She reports good compliance with treatment. She is not having side effects. none She is not exercising. She is adherent to low salt diet.   Outside blood pressures are 132/80-145/90. She is experiencing none.  Patient denies none.   Cardiovascular risk factors include advanced age (older than 47 for men, 31 for women).  Use of agents associated with hypertension: none.   ---------------------------------------------------------------    Lipid/Cholesterol, Follow-up:   Last seen for this 4 months ago.  Management since that visit includes; labs checked. Advised to try to cut back on fatty foods.  Last Lipid Panel:    Component Value Date/Time   CHOL 192 02/16/2018 1025   TRIG 89 02/16/2018 1025   HDL 100 02/16/2018 1025   CHOLHDL 1.9 02/16/2018 1025   LDLCALC 74 02/16/2018 1025    She reports good compliance with treatment. She is not having side effects. none  Wt Readings from Last 3 Encounters:  06/23/18 206 lb (93.4 kg)  06/21/18 206 lb 12.8 oz (93.8 kg)  02/28/18 206 lb (93.4 kg)    --------------------------------------------------------------- Trigeminal neuralgia  Has been taking combination amitriptyline and carbamazepine for several year and reports she is having no adverse effects, and no symptoms so long as she takes medications.    Allergies  Allergen Reactions  .  Quinapril-Hydrochlorothiazide Other (See Comments)    Other reaction(s): Headache  . Tape      Current Outpatient Medications:  .  ALPRAZolam (XANAX) 0.5 MG tablet, TAKE 1/2 TO 1 TABLET BY  MOUTH TWO TIMES DAILY, Disp: 90 tablet, Rfl: 3 .  aspirin 81 MG tablet, Take 1 tablet by mouth daily., Disp: , Rfl:  .  carbamazepine (TEGRETOL) 200 MG tablet, TAKE 1 TABLET BY MOUTH TWICE DAILY, Disp: 180 tablet, Rfl: 4 .  clopidogrel (PLAVIX) 75 MG tablet, TAKE 1 TABLET(75 MG) BY MOUTH DAILY, Disp: 90 tablet, Rfl: 4 .  dicyclomine (BENTYL) 20 MG tablet, Take 1 tablet (20 mg total) by mouth 3 (three) times daily as needed (abdominal pain)., Disp: 30 tablet, Rfl: 0 .  DILT-XR 180 MG 24 hr capsule, TAKE 2 CAPSULES(360 MG) BY MOUTH DAILY, Disp: 60 capsule, Rfl: 5 .  esomeprazole (NEXIUM) 20 MG capsule, Take 20 mg by mouth daily at 12 noon., Disp: , Rfl:  .  hydrochlorothiazide (HYDRODIURIL) 12.5 MG tablet, TAKE 1 TABLET(12.5 MG) BY MOUTH DAILY, Disp: 30 tablet, Rfl: 5 .  IRON PO, Take 1 tablet by mouth daily., Disp: , Rfl:  .  nortriptyline (PAMELOR) 10 MG capsule, TAKE 1 TO 2 CAPSULES BY  MOUTH AT BEDTIME, Disp: 180 capsule, Rfl: 4 .  ondansetron (ZOFRAN) 4 MG tablet, Take 1 tablet (4 mg total) by mouth every 8 (eight) hours as needed for nausea or vomiting., Disp: 20 tablet, Rfl: 0 .  simvastatin (ZOCOR) 20 MG tablet, TAKE 1 TABLET BY MOUTH  DAILY, Disp: 90 tablet, Rfl: 4  Review of Systems  Constitutional: Negative for appetite change, chills, fatigue and fever.  Respiratory: Negative for chest tightness and shortness of breath.   Cardiovascular: Negative for chest pain and palpitations.  Gastrointestinal: Negative for abdominal pain, nausea and vomiting.  Neurological: Negative for dizziness and weakness.    Social History   Tobacco Use  . Smoking status: Never Smoker  . Smokeless tobacco: Never Used  Substance Use Topics  . Alcohol use: No    Alcohol/week: 0.0 standard drinks   Objective:     BP (!) 141/80 (BP Location: Right Arm, Patient Position: Sitting, Cuff Size: Large)   Pulse 83   Temp 97.8 F (36.6 C) (Oral)   Resp 16   Wt 206 lb (93.4 kg)   SpO2 99%   BMI 40.23 kg/m  Vitals:   06/23/18 0946  BP: (!) 141/80  Pulse: 83  Resp: 16  Temp: 97.8 F (36.6 C)  TempSrc: Oral  SpO2: 99%  Weight: 206 lb (93.4 kg)     Physical Exam  General Appearance:    Alert, cooperative, no distress, obese  Eyes:    PERRL, conjunctiva/corneas clear, EOM's intact       Lungs:     Clear to auscultation bilaterally, respirations unlabored  Heart:    Regular rate and rhythm  Neurologic:   Awake, alert, oriented x 3. No apparent focal neurological           defect.          Assessment & Plan:     1. Tachycardia Asymptomatic, likely sinus arrhythmia.  - EKG 12-Lead - TSH  2. Need for hepatitis C screening test  - Hepatitis C antibody  3. Essential (primary) hypertension Well controlled.  Continue current medications.    4. Hyperlipidemia, unspecified hyperlipidemia type She is tolerating simvastatin well with no adverse effects.   - Lipid panel  5. Trigeminal neuralgia Well controlled on current dose of amitriptyline and carbamazepine. Continue current medications.  Mila Merry.        Dietrick Barris, MD  [facility]

## 2018-06-24 LAB — TSH: TSH: 1.63 u[IU]/mL (ref 0.450–4.500)

## 2018-06-24 LAB — LIPID PANEL
CHOL/HDL RATIO: 1.8 ratio (ref 0.0–4.4)
CHOLESTEROL TOTAL: 174 mg/dL (ref 100–199)
HDL: 99 mg/dL (ref >39)
LDL Calculated: 61 mg/dL (ref 0–99)
TRIGLYCERIDES: 68 mg/dL (ref 0–149)
VLDL Cholesterol Cal: 14 mg/dL (ref 5–40)

## 2018-06-24 LAB — HEPATITIS C ANTIBODY: Hep C Virus Ab: 0.3 s/co ratio (ref 0.0–0.9)

## 2018-07-26 DIAGNOSIS — Z779 Other contact with and (suspected) exposures hazardous to health: Secondary | ICD-10-CM | POA: Diagnosis not present

## 2018-08-02 ENCOUNTER — Encounter: Payer: Self-pay | Admitting: Emergency Medicine

## 2018-08-02 ENCOUNTER — Other Ambulatory Visit: Payer: Self-pay

## 2018-08-02 ENCOUNTER — Emergency Department: Payer: Medicare Other

## 2018-08-02 ENCOUNTER — Emergency Department
Admission: EM | Admit: 2018-08-02 | Discharge: 2018-08-02 | Disposition: A | Discharge status: Home / Self Care | Payer: Medicare Other | Attending: Emergency Medicine | Admitting: Emergency Medicine

## 2018-08-02 DIAGNOSIS — I10 Essential (primary) hypertension: Secondary | ICD-10-CM | POA: Insufficient documentation

## 2018-08-02 DIAGNOSIS — Z79899 Other long term (current) drug therapy: Secondary | ICD-10-CM | POA: Insufficient documentation

## 2018-08-02 DIAGNOSIS — R109 Unspecified abdominal pain: Secondary | ICD-10-CM | POA: Diagnosis not present

## 2018-08-02 DIAGNOSIS — Z7982 Long term (current) use of aspirin: Secondary | ICD-10-CM | POA: Diagnosis not present

## 2018-08-02 DIAGNOSIS — K5730 Diverticulosis of large intestine without perforation or abscess without bleeding: Secondary | ICD-10-CM | POA: Diagnosis not present

## 2018-08-02 DIAGNOSIS — R143 Flatulence: Secondary | ICD-10-CM | POA: Insufficient documentation

## 2018-08-02 DIAGNOSIS — Z7902 Long term (current) use of antithrombotics/antiplatelets: Secondary | ICD-10-CM | POA: Insufficient documentation

## 2018-08-02 MED ORDER — FAMOTIDINE 20 MG PO TABS
40.0000 mg | ORAL_TABLET | Freq: Once | ORAL | Status: AC
  Administered 2018-08-02: 40 mg via ORAL
  Filled 2018-08-02: qty 2

## 2018-08-02 MED ORDER — ALUM & MAG HYDROXIDE-SIMETH 200-200-20 MG/5ML PO SUSP
30.0000 mL | Freq: Once | ORAL | Status: AC
  Administered 2018-08-02: 30 mL via ORAL
  Filled 2018-08-02: qty 30

## 2018-08-02 MED ORDER — ALUMINUM-MAGNESIUM-SIMETHICONE 200-200-20 MG/5ML PO SUSP: 30.0000 mL | Freq: Three times a day (TID) | ORAL | 0 refills | Status: DC

## 2018-08-02 MED ORDER — FAMOTIDINE 20 MG PO TABS: 20.0000 mg | ORAL_TABLET | Freq: Two times a day (BID) | ORAL | 0 refills | Status: DC

## 2018-08-02 NOTE — ED Provider Notes (Signed)
[facility] Emergency Department Provider Note  ____________________________________________  Time seen: Approximately 8:47 AM  I have reviewed the triage vital signs and the nursing notes.   HISTORY  Chief Complaint Abdominal Pain    HPI Amy Ray is a 72 y.o. female with a history of GERD and hypertension who complains of intermittent abdominal pain that started last night.  Mostly in the upper area.  Also felt it in her shoulders sometimes and it feels like gas pain that she had in the past.  She did eat a hot dog with onions and mustard yesterday which she does not think makes her GERD worse.  Denies any vomiting.  Over the last couple of days she has intermittently taken both laxatives and Imodium trying to regulate her bowel movements.   Pain is nonradiating, no aggravating or alleviating factors.  She also notes that she is been passing a lot of gas recently.  Previously seen at [facility] in May 2019, had a CT scan of the abdomen and pelvis performed then which was overall unremarkable, results copied below  CT from [facility]: CT Abdomen Pelvis with IV Contrast ONLY 02/20/2018 [facility] Result Impression  - No acute abnormality in the abdomen or pelvis.  Result Narrative  EXAM: CT abdomen and pelvis with contrast DATE: 02/20/2018 4:22 PM ACCESSION: [id number] UN DICTATED: 02/20/2018 4:27 PM INTERPRETATION LOCATION: Main Campus  CLINICAL INDICATION: 72 year old female with right lower quadrant abdominal pain.  COMPARISON: None  TECHNIQUE: A spiral CT scan was obtained with IV contrast from the lung bases to the pubic symphysis.Images were reconstructed in the axial plane. Coronal and sagittal reformatted images were also provided for further evaluation.  FINDINGS:   LOWER CHEST: Unremarkable.  ABDOMEN/PELVIS  HEPATOBILIARY: Subcentimeter hypodensities in the liver, measuring up to 7 mm, too small to characterize (2:52). No biliary ductal  dilatation. Gallbladder is surgically absent. PANCREAS: Unremarkable. SPLEEN: Unremarkable. ADRENAL GLANDS: Unremarkable. KIDNEYS/URETERS: Left lower pole hypodensity, too small to characterize but likely a cyst. Symmetric nephrograms. No hydronephrosis. BLADDER: Unremarkable. BOWEL/PERITONEUM/RETROPERITONEUM: No bowel obstruction. Colonic diverticulosis. Normal appendix. No free air. No acute inflammatory process. No ascites. VASCULATURE: Abdominal aorta within normal limits for patient's age. Unremarkable inferior vena cava. LYMPH NODES: No adenopathy. REPRODUCTIVE ORGANS: Loop-shaped metallic intrauterine device. No adnexal mass.  BONES/SOFT TISSUES: Multilevel degenerative changes of the spine with grade 1 anterolisthesis of L4 on L5. The soft tissues are unremarkable. Fat-containing left inguinal hernia.      Past Medical History:  Diagnosis Date  . History of diverticulitis of colon 05/20/2015   per patient report   . Hyperlipidemia   . Hypertension   . Stroke (HCC)   . TIA (transient ischemic attack)   . Vertigo      Patient Active Problem List   Diagnosis Date Noted  . Low back pain 05/20/2015  . Fatigue 05/20/2015  . History of CVA (cerebrovascular accident) 05/20/2015  . Insomnia 05/20/2015  . Morbid obesity (HCC) 05/20/2015  . Positive H. pylori test 05/20/2015  . Tremor 05/20/2015  . Dysfunction of eustachian tube 05/20/2015  . Diverticulosis 05/20/2015  . Essential (primary) hypertension 07/17/2007  . Alterations of sensations, late effect of cerebrovascular disease 03/11/2005  . Hyperlipidemia 10/12/2003  . Trigeminal neuralgia 10/12/2003     Past Surgical History:  Procedure Laterality Date  . CHOLECYSTECTOMY  2000  . HERNIA REPAIR     umbilical hernia as a child  . REPLACEMENT TOTAL KNEE Bilateral      Prior to Admission medications  Medication Sig Start Date End Date Taking? Authorizing Provider  ALPRAZolam [name]) 0.5 MG tablet TAKE 1/2 TO 1  TABLET BY  MOUTH TWO TIMES DAILY 05/03/18   Malva Limes, MD  aluminum-magnesium hydroxide-simethicone (MAALOX) 200-200-20 MG/5ML SUSP Take 30 mLs by mouth 4 (four) times daily -  before meals and at bedtime. 08/02/18   Sharman Cheek, MD  aspirin 81 MG tablet Take 1 tablet by mouth daily.    [provider]  carbamazepine (TEGRETOL) 200 MG tablet TAKE 1 TABLET BY MOUTH TWICE DAILY 11/03/17   Malva Limes, MD  clopidogrel (PLAVIX) 75 MG tablet TAKE 1 TABLET(75 MG) BY MOUTH DAILY 11/03/17   Malva Limes, MD  dicyclomine (BENTYL) 20 MG tablet Take 1 tablet (20 mg total) by mouth 3 (three) times daily as needed (abdominal pain). 02/17/18   Phineas Semen, MD  DILT-XR 180 MG 24 hr capsule TAKE 2 CAPSULES(360 MG) BY MOUTH DAILY 04/12/18   Malva Limes, MD  esomeprazole (NEXIUM) 20 MG capsule Take 20 mg by mouth daily at 12 noon.    [provider]  famotidine (PEPCID) 20 MG tablet Take 1 tablet (20 mg total) by mouth 2 (two) times daily. 08/02/18   Sharman Cheek, MD  hydrochlorothiazide (HYDRODIURIL) 12.5 MG tablet TAKE 1 TABLET(12.5 MG) BY MOUTH DAILY 02/16/18   Malva Limes, MD  IRON PO Take 1 tablet by mouth daily.    [provider]  nortriptyline (PAMELOR) 10 MG capsule TAKE 1 TO 2 CAPSULES BY  MOUTH AT BEDTIME 10/24/17   Malva Limes, MD  ondansetron (ZOFRAN) 4 MG tablet Take 1 tablet (4 mg total) by mouth every 8 (eight) hours as needed for nausea or vomiting. 02/17/18   Phineas Semen, MD  simvastatin (ZOCOR) 20 MG tablet TAKE 1 TABLET BY MOUTH  DAILY 09/07/17   Malva Limes, MD     Allergies Augmentin [amoxicillin-pot clavulanate]; Quinapril-hydrochlorothiazide; and Tape   Family History  Problem Relation Age of Onset  . Heart attack Mother   . Hypertension Mother   . Alzheimer's disease Father   . Diabetes Father   . Hypertension Sister   . Diabetes Brother   . Hypertension Sister   . Hypertension Sister   . Hypertension  Sister   . Hypertension Sister   . Breast cancer Sister 38  . Breast cancer Maternal Aunt 57    Social History Social History   Tobacco Use  . Smoking status: Never Smoker  . Smokeless tobacco: Never Used  Substance Use Topics  . Alcohol use: No    Alcohol/week: 0.0 standard drinks  . Drug use: No    Review of Systems  Constitutional:   No fever or chills.  ENT:   No sore throat. No rhinorrhea. Cardiovascular:   No chest pain or syncope. Respiratory:   No dyspnea or cough. Gastrointestinal: Positive for abdominal pain and irregular bowel movements.  Musculoskeletal:   Negative for focal pain or swelling All other systems reviewed and are negative except as documented above in ROS and HPI.  ____________________________________________   PHYSICAL EXAM:  VITAL SIGNS: ED Triage Vitals  Enc Vitals Group     BP 08/02/18 0809 (!) 151/72     Pulse Rate 08/02/18 0809 98     Resp 08/02/18 0809 18     Temp 08/02/18 0809 97.9 F (36.6 C)     Temp Source 08/02/18 0809 Oral     SpO2 08/02/18 0809 100 %  Weight --      Height --      Head Circumference --      Peak Flow --      Pain Score 08/02/18 0812 8     Pain Loc --      Pain Edu? --      Excl. in GC? --     Vital signs reviewed, nursing assessments reviewed.   Constitutional:   Alert and oriented. Non-toxic appearance. Eyes:   Conjunctivae are normal. EOMI. PERRL. ENT      Head:   Normocephalic and atraumatic.      Nose:   No congestion/rhinnorhea.       Mouth/Throat:   MMM, no pharyngeal erythema. No peritonsillar mass.       Neck:   No meningismus. Full ROM. Hematological/Lymphatic/Immunilogical:   No cervical lymphadenopathy. Cardiovascular:   RRR. Symmetric bilateral radial and DP pulses.  No murmurs. Cap refill less than 2 seconds. Respiratory:   Normal respiratory effort without tachypnea/retractions. Breath sounds are clear and equal bilaterally. No wheezes/rales/rhonchi. Gastrointestinal:   Soft and  nontender. Non distended. There is no CVA tenderness.  No rebound, rigidity, or guarding. Musculoskeletal:   Normal range of motion in all extremities. No joint effusions.  No lower extremity tenderness.  No edema. Neurologic:   Normal speech and language.  Motor grossly intact. No acute focal neurologic deficits are appreciated.  Skin:    Skin is warm, dry and intact. No rash noted.  No petechiae, purpura, or bullae.  ____________________________________________    LABS (pertinent positives/negatives) (all labs ordered are listed, but only abnormal results are displayed) Labs Reviewed - No data to display ____________________________________________   EKG    ____________________________________________    RADIOLOGY  Dg Abdomen Acute W/chest  Result Date: 08/02/2018 CLINICAL DATA:  72 year old female with intermittent abdominal pain for the past several days. EXAM: DG ABDOMEN ACUTE W/ 1V CHEST COMPARISON:  Prior chest x-ray 10/15/2017; prior CT scan of the chest 04/23/2006 FINDINGS: Cardiac and mediastinal contours are unchanged. Apparent widening of the transverse aorta can be accounted for by the presence of a prominent peri aortic cardinal vein as seen on prior CT imaging. The lungs are clear. Prior left reverse shoulder arthroplasty changes. Advanced right glenohumeral joint osteoarthritis with loose bodies in the axillary recess. The bowel gas pattern is not obstructed. Gas is noted in loops of nondilated small and large bowel. Multiple small circular gas bubbles overlie the pelvis likely reflecting gas within underlying colonic diverticulosis. No evidence of free air. Surgical clips in the right upper quadrant suggest prior cholecystectomy. And endometrial implant projects over the anatomic pelvis. No acute osseous abnormality. Lower lumbar degenerative disc disease. IMPRESSION: 1. Colonic diverticulosis is present which raises the possibility of underlying diverticulitis. CT scan  of the abdomen and pelvis with intravenous contrast could further evaluate if clinically warranted. 2. No evidence of bowel obstruction. 3. No acute cardiopulmonary process. Electronically Signed   By: Malachy Moan M.D.   On: 08/02/2018 10:00    ____________________________________________   PROCEDURES Procedures  ____________________________________________    CLINICAL IMPRESSION / ASSESSMENT AND PLAN / ED COURSE  Pertinent labs & imaging results that were available during my care of the patient were reviewed by me and considered in my medical decision making (see chart for details).    Patient presents with vague intermittent abdominal pain, currently pain-free.  Exam is normal, vital signs are unremarkable.  Recent CT scan shows no evidence of AAA.  She does have  diverticulosis but clinically no diverticulitis at this time.  Doubt biliary disease pancreatitis perforation or bowel obstruction.  Abdominal x-ray series is unremarkable.  Discharged home with Maalox and H2 blocker, follow-up with primary care.      ____________________________________________   FINAL CLINICAL IMPRESSION(S) / ED DIAGNOSES    Final diagnoses:  Abdominal pain, unspecified abdominal location  Flatus     ED Discharge Orders         Ordered    aluminum-magnesium hydroxide-simethicone (MAALOX) 200-200-20 MG/5ML SUSP  3 times daily before meals & bedtime     08/02/18 1029    famotidine (PEPCID) 20 MG tablet  2 times daily     08/02/18 1029          Portions of this note were generated with dragon dictation software. Dictation errors may occur despite best attempts at proofreading.    Sharman Cheek, MD 08/02/18 1032

## 2018-08-02 NOTE — ED Triage Notes (Signed)
PT c/o intermit abd pain throughout the night. Denies n/v/d. VSS, NAD noted

## 2018-08-10 ENCOUNTER — Ambulatory Visit: Payer: Medicare Other | Admitting: Family Medicine

## 2018-08-16 ENCOUNTER — Ambulatory Visit (INDEPENDENT_AMBULATORY_CARE_PROVIDER_SITE_OTHER): Payer: Medicare Other | Admitting: Family Medicine

## 2018-08-16 ENCOUNTER — Other Ambulatory Visit: Payer: Self-pay | Admitting: Family Medicine

## 2018-08-16 ENCOUNTER — Encounter: Payer: Self-pay | Admitting: Family Medicine

## 2018-08-16 VITALS — BP 140/82 | HR 83 | Temp 98.5°F | Resp 16 | Wt 210.0 lb

## 2018-08-16 DIAGNOSIS — I10 Essential (primary) hypertension: Secondary | ICD-10-CM

## 2018-08-16 DIAGNOSIS — G500 Trigeminal neuralgia: Secondary | ICD-10-CM

## 2018-08-16 MED ORDER — CARBAMAZEPINE 200 MG PO TABS
200.0000 mg | ORAL_TABLET | Freq: Two times a day (BID) | ORAL | 4 refills | Status: DC
Start: 2018-08-16 — End: 2018-11-15

## 2018-08-16 NOTE — Progress Notes (Signed)
Patient: Amy Ray Female    DOB: December 07, 1945   72 y.o.   MRN: [id number] Visit Date: 08/16/2018  Today's Provider: Mila Merry, MD   Chief Complaint  Patient presents with  . Follow-up   Subjective:    HPI Trigeminal Neuralgia: Patient comes in today wanting to discuss changing her medications. She states the Carbamazepine is going to cost over $400 starting 10/11/2018. Patient wants to be switched to something less expensive. She has tried stopping medication, but starts tingling right away.     Allergies  Allergen Reactions  . Augmentin [Amoxicillin-Pot Clavulanate] Hives  . Quinapril-Hydrochlorothiazide Other (See Comments)    Other reaction(s): Headache  . Tape      Current Outpatient Medications:  .  ALPRAZolam (XANAX) 0.5 MG tablet, TAKE 1/2 TO 1 TABLET BY  MOUTH TWO TIMES DAILY, Disp: 90 tablet, Rfl: 3 .  aluminum-magnesium hydroxide-simethicone (MAALOX) 200-200-20 MG/5ML SUSP, Take 30 mLs by mouth 4 (four) times daily -  before meals and at bedtime., Disp: 355 mL, Rfl: 0 .  aspirin 81 MG tablet, Take 1 tablet by mouth daily., Disp: , Rfl:  .  carbamazepine (TEGRETOL) 200 MG tablet, TAKE 1 TABLET BY MOUTH TWICE DAILY, Disp: 180 tablet, Rfl: 4 .  clopidogrel (PLAVIX) 75 MG tablet, TAKE 1 TABLET(75 MG) BY MOUTH DAILY, Disp: 90 tablet, Rfl: 4 .  DILT-XR 180 MG 24 hr capsule, TAKE 2 CAPSULES(360 MG) BY MOUTH DAILY, Disp: 60 capsule, Rfl: 5 .  esomeprazole (NEXIUM) 20 MG capsule, Take 20 mg by mouth daily at 12 noon., Disp: , Rfl:  .  famotidine (PEPCID) 20 MG tablet, Take 1 tablet (20 mg total) by mouth 2 (two) times daily., Disp: 60 tablet, Rfl: 0 .  hydrochlorothiazide (HYDRODIURIL) 12.5 MG tablet, TAKE 1 TABLET(12.5 MG) BY MOUTH DAILY, Disp: 30 tablet, Rfl: 5 .  IRON PO, Take 1 tablet by mouth daily., Disp: , Rfl:  .  nortriptyline (PAMELOR) 10 MG capsule, TAKE 1 TO 2 CAPSULES BY  MOUTH AT BEDTIME, Disp: 180 capsule, Rfl: 4 .  simvastatin (ZOCOR) 20 MG  tablet, TAKE 1 TABLET BY MOUTH  DAILY, Disp: 90 tablet, Rfl: 4 .  ondansetron (ZOFRAN) 4 MG tablet, Take 1 tablet (4 mg total) by mouth every 8 (eight) hours as needed for nausea or vomiting. (Patient not taking: Reported on 08/16/2018), Disp: 20 tablet, Rfl: 0  Review of Systems  Constitutional: Negative for appetite change, chills, fatigue and fever.  Respiratory: Negative for chest tightness and shortness of breath.   Cardiovascular: Negative for chest pain and palpitations.  Gastrointestinal: Negative for abdominal pain, nausea and vomiting.  Neurological: Negative for dizziness and weakness.    Social History   Tobacco Use  . Smoking status: Never Smoker  . Smokeless tobacco: Never Used  Substance Use Topics  . Alcohol use: No    Alcohol/week: 0.0 standard drinks   Objective:   BP 140/82 (BP Location: Left Arm, Patient Position: Sitting, Cuff Size: Large)   Pulse 83   Temp 98.5 F (36.9 C) (Oral)   Resp 16   Wt 210 lb (95.3 kg)   SpO2 99% Comment: room air  BMI 41.01 kg/m  Vitals:   08/16/18 1117  BP: 140/82  Pulse: 83  Resp: 16  Temp: 98.5 F (36.9 C)  TempSrc: Oral  SpO2: 99%  Weight: 210 lb (95.3 kg)     Physical Exam  General appearance: alert, well developed, well nourished, cooperative and in no distress  Head: Normocephalic, without obvious abnormality, atraumatic Respiratory: Respirations even and unlabored, normal respiratory rate     Assessment & Plan:     1. Trigeminal neuralgia Well controlled for many years on amitriptyline and Carbamezapine. Printed [employer] of local pharmacies and coupon price, and given GoodRx coupon. She can try weaning to 1 tablet daily to reduce cost of medication. Mila Merry, MD  [facility]

## 2018-08-16 NOTE — Patient Instructions (Addendum)
   Try reducing carbamazepine to one tablet every day for the next month. If doing well then stop.    If pains come back then take printed prescription and coupon to pharmacy of your choice.    The CDC recommends two doses of Shingrix (the shingles vaccine) separated by 2 to 6 months for adults age 71 years and older. I recommend checking with your pharmacy plan regarding coverage for this vaccine.

## 2018-08-22 ENCOUNTER — Other Ambulatory Visit: Payer: Self-pay | Admitting: Family Medicine

## 2018-08-22 DIAGNOSIS — I10 Essential (primary) hypertension: Secondary | ICD-10-CM

## 2018-08-22 MED ORDER — HYDROCHLOROTHIAZIDE 12.5 MG PO TABS: 12.5000 mg | ORAL_TABLET | Freq: Every day | ORAL | 4 refills | Status: DC

## 2018-08-22 MED ORDER — CLOPIDOGREL BISULFATE 75 MG PO TABS: 75.0000 mg | ORAL_TABLET | Freq: Every day | ORAL | 4 refills | Status: DC

## 2018-08-22 NOTE — Telephone Encounter (Signed)
[facility] faxed new prescription request for the following medications:  hydrochlorothiazide (HYDRODIURIL) 12.5 MG tablet  clopidogrel (PLAVIX) 75 MG tablet  Please advise.

## 2018-09-14 ENCOUNTER — Other Ambulatory Visit: Payer: Self-pay | Admitting: Family Medicine

## 2018-09-14 MED ORDER — SIMVASTATIN 20 MG PO TABS: 20.0000 mg | ORAL_TABLET | Freq: Every day | ORAL | 4 refills | Status: DC

## 2018-09-14 NOTE — Telephone Encounter (Signed)
Please review. Thanks!  

## 2018-09-14 NOTE — Telephone Encounter (Signed)
[facility] faxed refill request for the following medications:  simvastatin (ZOCOR) 20 MG tablet   90 day supply  Last Rx: 09/07/17 LOV: 08/16/18 NOV: 06/25/2019 Please advise. Thanks TNP

## 2018-10-10 ENCOUNTER — Emergency Department
Admission: EM | Admit: 2018-10-10 | Discharge: 2018-10-10 | Disposition: A | Discharge status: Home / Self Care | Payer: Medicare Other | Attending: Emergency Medicine | Admitting: Emergency Medicine

## 2018-10-10 ENCOUNTER — Other Ambulatory Visit: Payer: Self-pay

## 2018-10-10 ENCOUNTER — Encounter: Payer: Self-pay | Admitting: Emergency Medicine

## 2018-10-10 ENCOUNTER — Emergency Department: Payer: Medicare Other

## 2018-10-10 DIAGNOSIS — E785 Hyperlipidemia, unspecified: Secondary | ICD-10-CM | POA: Diagnosis not present

## 2018-10-10 DIAGNOSIS — S299XXA Unspecified injury of thorax, initial encounter: Secondary | ICD-10-CM | POA: Diagnosis not present

## 2018-10-10 DIAGNOSIS — W19XXXA Unspecified fall, initial encounter: Secondary | ICD-10-CM

## 2018-10-10 DIAGNOSIS — R531 Weakness: Secondary | ICD-10-CM | POA: Insufficient documentation

## 2018-10-10 DIAGNOSIS — Y9289 Other specified places as the place of occurrence of the external cause: Secondary | ICD-10-CM | POA: Insufficient documentation

## 2018-10-10 DIAGNOSIS — Y998 Other external cause status: Secondary | ICD-10-CM | POA: Insufficient documentation

## 2018-10-10 DIAGNOSIS — Y9389 Activity, other specified: Secondary | ICD-10-CM | POA: Insufficient documentation

## 2018-10-10 DIAGNOSIS — I10 Essential (primary) hypertension: Secondary | ICD-10-CM | POA: Diagnosis not present

## 2018-10-10 DIAGNOSIS — W010XXA Fall on same level from slipping, tripping and stumbling without subsequent striking against object, initial encounter: Secondary | ICD-10-CM | POA: Diagnosis not present

## 2018-10-10 DIAGNOSIS — Z8673 Personal history of transient ischemic attack (TIA), and cerebral infarction without residual deficits: Secondary | ICD-10-CM | POA: Diagnosis not present

## 2018-10-10 DIAGNOSIS — R0781 Pleurodynia: Secondary | ICD-10-CM | POA: Diagnosis not present

## 2018-10-10 LAB — URINALYSIS, COMPLETE (UACMP) WITH MICROSCOPIC
Bilirubin Urine: NEGATIVE
Glucose, UA: NEGATIVE mg/dL
Hgb urine dipstick: NEGATIVE
Ketones, ur: NEGATIVE mg/dL
LEUKOCYTES UA: NEGATIVE
Nitrite: NEGATIVE
PH: 5 (ref 5.0–8.0)
Protein, ur: NEGATIVE mg/dL
SPECIFIC GRAVITY, URINE: 1.019 (ref 1.005–1.030)

## 2018-10-10 LAB — BASIC METABOLIC PANEL
Anion gap: 9 (ref 5–15)
BUN: 23 mg/dL (ref 8–23)
CALCIUM: 8.9 mg/dL (ref 8.9–10.3)
CHLORIDE: 105 mmol/L (ref 98–111)
CO2: 27 mmol/L (ref 22–32)
CREATININE: 0.8 mg/dL (ref 0.44–1.00)
GFR calc non Af Amer: >60 mL/min (ref >60)
Glucose, Bld: 92 mg/dL (ref 70–99)
Potassium: 3.9 mmol/L (ref 3.5–5.1)
Sodium: 141 mmol/L (ref 135–145)

## 2018-10-10 LAB — CBC
HEMATOCRIT: 36.7 % (ref 36.0–46.0)
Hemoglobin: 11.9 g/dL — ABNORMAL LOW (ref 12.0–15.0)
MCH: 32.9 pg (ref 26.0–34.0)
MCHC: 32.4 g/dL (ref 30.0–36.0)
MCV: 101.4 fL — AB (ref 80.0–100.0)
NRBC: 0 % (ref 0.0–0.2)
PLATELETS: 213 10*3/uL (ref 150–400)
RBC: 3.62 MIL/uL — ABNORMAL LOW (ref 3.87–5.11)
RDW: 12.3 % (ref 11.5–15.5)
WBC: 6.1 10*3/uL (ref 4.0–10.5)

## 2018-10-10 NOTE — ED Triage Notes (Signed)
Pt presents to ED via POV with c/o increasing falls. Pt states increasing falls due to knee replacements. Today patient states had sudden onset worsening of weakness, described it as tired and lethargic. Pt is A&O x4, NAD noted at this time.

## 2018-10-10 NOTE — ED Triage Notes (Signed)
FIRST NURSE NOTE-been feeling a little weak and has had multiple falls over last few days. Alert and answering questions. NAD

## 2018-10-10 NOTE — ED Provider Notes (Signed)
[facility] Emergency Department Provider Note       Time seen: ----------------------------------------- 5:28 PM on 10/10/2018 -----------------------------------------   I have reviewed the triage vital signs and the nursing notes.  HISTORY   Chief Complaint Fall and Weakness    HPI Amy Ray is a 72 y.o. female with a history of diverticulitis, hyperlipidemia, hypertension, CVA, TIA, vertigo who presents to the ED for increasing falls.  Patient states she has had increasing falls due to knee replacement and due to chronic trouble with her balance for the last 3 years.  She denies any recent illness.  She was having some left-sided rib pain from a recent fall.  Past Medical History:  Diagnosis Date  . History of diverticulitis of colon 05/20/2015   per patient report   . Hyperlipidemia   . Hypertension   . Stroke (HCC)   . TIA (transient ischemic attack)   . Vertigo     Patient Active Problem List   Diagnosis Date Noted  . Low back pain 05/20/2015  . Fatigue 05/20/2015  . History of CVA (cerebrovascular accident) 05/20/2015  . Insomnia 05/20/2015  . Morbid obesity (HCC) 05/20/2015  . Positive H. pylori test 05/20/2015  . Tremor 05/20/2015  . Dysfunction of eustachian tube 05/20/2015  . Diverticulosis 05/20/2015  . Essential (primary) hypertension 07/17/2007  . Alterations of sensations, late effect of cerebrovascular disease 03/11/2005  . Hyperlipidemia 10/12/2003  . Trigeminal neuralgia 10/12/2003    Past Surgical History:  Procedure Laterality Date  . CHOLECYSTECTOMY  2000  . HERNIA REPAIR     umbilical hernia as a child  . REPLACEMENT TOTAL KNEE Bilateral     Allergies Augmentin [amoxicillin-pot clavulanate]; Quinapril-hydrochlorothiazide; and Tape  Social History Social History   Tobacco Use  . Smoking status: Never Smoker  . Smokeless tobacco: Never Used  Substance Use Topics  . Alcohol use: No    Alcohol/week: 0.0  standard drinks  . Drug use: No   Review of Systems Constitutional: Negative for fever. Cardiovascular: Negative for chest pain. Respiratory: Negative for shortness of breath. Gastrointestinal: Negative for abdominal pain, vomiting and diarrhea. Musculoskeletal: Positive for left-sided rib pain Skin: Negative for rash. Neurological: Negative for headaches, positive for weakness  All systems negative/normal/unremarkable except as stated in the HPI  ____________________________________________   PHYSICAL EXAM:  VITAL SIGNS: ED Triage Vitals [10/10/18 1511]  Enc Vitals Group     BP (!) 144/66     Pulse Rate 80     Resp 18     Temp 98.7 F (37.1 C)     Temp Source Oral     SpO2 98 %     Weight 260 lb (117.9 kg)     Height 5\' 2"  (1.575 m)     Head Circumference      Peak Flow      Pain Score 5     Pain Loc      Pain Edu?      Excl. in GC?    Constitutional: Alert and oriented. Well appearing and in no distress. Cardiovascular: Normal rate, regular rhythm. No murmurs, rubs, or gallops. Respiratory: Normal respiratory effort without tachypnea nor retractions. Breath sounds are clear and equal bilaterally. No wheezes/rales/rhonchi. Gastrointestinal: Soft and nontender. Normal bowel sounds Musculoskeletal: Nontender with normal range of motion in extremities.  Left inferior rib tenderness is noted Neurologic:  Normal speech and language. No gross focal neurologic deficits are appreciated.  Skin:  Skin is warm, dry and intact. No rash  noted. Psychiatric: Mood and affect are normal. Speech and behavior are normal.  ____________________________________________  EKG: Interpreted by me.  Sinus rhythm the rate 81 bpm, possible LVH, normal axis, normal QT  ____________________________________________  ED COURSE:  As part of my medical decision making, I reviewed the following data within the electronic MEDICAL RECORD NUMBER History obtained from family if available, nursing notes,  old chart and ekg, as well as notes from prior ED visits. Patient presented for fall with weakness, we will assess with labs and imaging as indicated at this time.   Procedures ____________________________________________   LABS (pertinent positives/negatives)  Labs Reviewed  CBC - Abnormal; Notable for the following components:      Result Value   RBC 3.62 (*)    Hemoglobin 11.9 (*)    MCV 101.4 (*)    All other components within normal limits  BASIC METABOLIC PANEL  URINALYSIS, COMPLETE (UACMP) WITH MICROSCOPIC    RADIOLOGY Images were viewed by me  Left rib x-rays are negative  ____________________________________________  DIFFERENTIAL DIAGNOSIS   Contusion, fracture, dehydration, electrolyte abnormality, anemia, occult infection  FINAL ASSESSMENT AND PLAN  Fall, weakness   Plan: The patient had presented for recent fall. Patient's labs did not reveal any acute process. Patient's imaging was negative.  She will be discharged with close outpatient follow-up.   Ulice DashJohnathan E Deveney Bayon, MD   Note: This note was generated in part or whole with voice recognition software. Voice recognition is usually quite accurate but there are transcription errors that can and very often do occur. I apologize for any typographical errors that were not detected and corrected.     Emily FilbertWilliams, Renella Steig E, MD 10/10/18 [phone]

## 2018-10-12 ENCOUNTER — Other Ambulatory Visit: Payer: Self-pay | Admitting: Family Medicine

## 2018-10-21 ENCOUNTER — Other Ambulatory Visit: Payer: Self-pay | Admitting: Family Medicine

## 2018-11-13 ENCOUNTER — Other Ambulatory Visit: Payer: Self-pay | Admitting: Family Medicine

## 2018-11-14 ENCOUNTER — Other Ambulatory Visit: Payer: Self-pay | Admitting: Family Medicine

## 2018-11-14 MED ORDER — DILTIAZEM HCL ER 180 MG PO CP24: ORAL_CAPSULE | ORAL | 4 refills | Status: DC

## 2018-11-14 NOTE — Telephone Encounter (Signed)
[facility] faxed refill request for the following medications:  DILT-XR 180 MG 24 hr capsule  90 day supply  Last Rx: 10/12/2018 sent to [facility] LOV: 08/16/18 Please advise. Thanks TNP

## 2018-11-15 ENCOUNTER — Other Ambulatory Visit: Payer: Self-pay | Admitting: Family Medicine

## 2018-12-19 ENCOUNTER — Ambulatory Visit: Payer: Self-pay | Admitting: Family Medicine

## 2018-12-26 ENCOUNTER — Ambulatory Visit: Payer: Self-pay | Admitting: Family Medicine

## 2019-02-26 ENCOUNTER — Other Ambulatory Visit: Payer: Self-pay | Admitting: Family Medicine

## 2019-03-16 ENCOUNTER — Encounter: Payer: Self-pay | Admitting: Family Medicine

## 2019-03-16 ENCOUNTER — Ambulatory Visit (INDEPENDENT_AMBULATORY_CARE_PROVIDER_SITE_OTHER): Payer: Medicare Other | Admitting: Family Medicine

## 2019-03-16 ENCOUNTER — Other Ambulatory Visit: Payer: Self-pay

## 2019-03-16 VITALS — BP 132/82 | HR 92 | Temp 98.5°F | Wt 212.0 lb

## 2019-03-16 DIAGNOSIS — E785 Hyperlipidemia, unspecified: Secondary | ICD-10-CM | POA: Diagnosis not present

## 2019-03-16 DIAGNOSIS — G500 Trigeminal neuralgia: Secondary | ICD-10-CM

## 2019-03-16 DIAGNOSIS — I10 Essential (primary) hypertension: Secondary | ICD-10-CM

## 2019-03-16 NOTE — Patient Instructions (Signed)
.   Please review the attached list of medications and notify my office if there are any errors.   . Please bring all of your medications to every appointment so we can make sure that our medication list is the same as yours.   

## 2019-03-16 NOTE — Progress Notes (Signed)
Patient: Amy Ray Female    DOB: 1945-12-01   73 y.o.   MRN: [id number] Visit Date: 03/16/2019  Today's Provider: Mila Merry, MD   Chief Complaint  Patient presents with  . Hyperlipidemia  . Hypertension   Subjective:     HPI     Follow up for Trigmeninal Neuraglia  The patient was last seen for this 9 months ago. Changes made at last visit include No changes.  Pt reports she is still taking carbamazeprine twice a day.  She reports excellent compliance with treatment. She feels that condition is Unchanged. She is not having side effects.   ------------------------------------------------------------------------------------     Hypertension, follow-up:  BP Readings from Last 3 Encounters:  10/10/18 105/73  08/16/18 140/82  08/02/18 129/80    She was last seen for hypertension 9 months ago.  BP at that visit was 141/80. Management since that visit includes No changes She reports excellent compliance with treatment. She is not having side effects.  She is exercising. She is adherent to low salt diet.   Outside blood pressures are 140's/60's. She is experiencing none.  Patient denies chest pain, lower extremity edema, palpitations and syncope.   Cardiovascular risk factors include advanced age (older than 62 for men, 20 for women) and hypertension.  Use of agents associated with hypertension: none.   ------------------------------------------------------------------------    Lipid/Cholesterol, Follow-up:   Last seen for this 9 months ago.  Management since that visit includes no changes.  Last Lipid Panel:    Component Value Date/Time   CHOL 174 06/23/2018 1029   TRIG 68 06/23/2018 1029   HDL 99 06/23/2018 1029   CHOLHDL 1.8 06/23/2018 1029   LDLCALC 61 06/23/2018 1029    She reports excellent compliance with treatment. She is not having side effects.   Wt Readings from Last 3 Encounters:  10/10/18 260 lb (117.9 kg)  08/16/18 210  lb (95.3 kg)  06/23/18 206 lb (93.4 kg)    ------------------------------------------------------------------------    Allergies  Allergen Reactions  . Augmentin [Amoxicillin-Pot Clavulanate] Hives  . Quinapril-Hydrochlorothiazide Other (See Comments)    Other reaction(s): Headache  . Tape      Current Outpatient Medications:  .  ALPRAZolam (XANAX) 0.5 MG tablet, TAKE 1/2 TO 1 TABLET BY  MOUTH TWO TIMES DAILY, Disp: 90 tablet, Rfl: 3 .  aluminum-magnesium hydroxide-simethicone (MAALOX) 200-200-20 MG/5ML SUSP, Take 30 mLs by mouth 4 (four) times daily -  before meals and at bedtime., Disp: 355 mL, Rfl: 0 .  aspirin 81 MG tablet, Take 1 tablet by mouth daily., Disp: , Rfl:  .  carbamazepine (TEGRETOL) 200 MG tablet, TAKE 1 TABLET BY MOUTH TWICE DAILY, Disp: 180 tablet, Rfl: 4 .  clopidogrel (PLAVIX) 75 MG tablet, TAKE 1 TABLET(75 MG) BY MOUTH DAILY, Disp: 90 tablet, Rfl: 4 .  diltiazem (DILT-XR) 180 MG 24 hr capsule, TAKE 2 CAPSULES(360 MG) BY MOUTH DAILY, Disp: 180 capsule, Rfl: 4 .  esomeprazole (NEXIUM) 20 MG capsule, Take 20 mg by mouth daily at 12 noon., Disp: , Rfl:  .  famotidine (PEPCID) 20 MG tablet, Take 1 tablet (20 mg total) by mouth 2 (two) times daily., Disp: 60 tablet, Rfl: 0 .  hydrochlorothiazide (HYDRODIURIL) 12.5 MG tablet, Take 1 tablet (12.5 mg total) by mouth daily., Disp: 90 tablet, Rfl: 4 .  IRON PO, Take 1 tablet by mouth daily., Disp: , Rfl:  .  nortriptyline (PAMELOR) 10 MG capsule, TAKE 1 TO 2 CAPSULES BY  MOUTH AT BEDTIME, Disp: 180 capsule, Rfl: 4 .  simvastatin (ZOCOR) 20 MG tablet, Take 1 tablet (20 mg total) by mouth daily., Disp: 90 tablet, Rfl: 4  Review of Systems  Constitutional: Negative.   Respiratory: Positive for shortness of breath. Negative for apnea, cough, choking, chest tightness, wheezing and stridor.   Cardiovascular: Negative.   Gastrointestinal: Positive for constipation. Negative for abdominal distention, abdominal pain, anal  bleeding, blood in stool, diarrhea, nausea, rectal pain and vomiting.  Neurological: Negative for dizziness, light-headedness and headaches.    Social History   Tobacco Use  . Smoking status: Never Smoker  . Smokeless tobacco: Never Used  Substance Use Topics  . Alcohol use: No    Alcohol/week: 0.0 standard drinks      Objective:    Vitals:   03/16/19 1039 03/16/19 1105  BP: (!) 122/95 132/82  Pulse: 92   Temp: 98.5 F (36.9 C)   TempSrc: Oral   Weight: 212 lb (96.2 kg)     Physical Exam  General Appearance:    Alert, cooperative, no distress, obese  Eyes:    PERRL, conjunctiva/corneas clear, EOM's intact       Lungs:     Clear to auscultation bilaterally, respirations unlabored  Heart:    Regular rate and rhythm  Neurologic:   Awake, alert, oriented x 3. No apparent focal neurological           defect.           Assessment & Plan    1. Essential (primary) hypertension Well controlled.  Continue current medications.    2. Hyperlipidemia, unspecified hyperlipidemia type She is tolerating simvastatin well with no adverse effects.    3. Trigeminal neuralgia Well controlled on current medications. Continue unchanged.   Follow up in 4-5 months.    The entirety of the information documented in the History of Present Illness, Review of Systems and Physical Exam were personally obtained by me. Portions of this information were initially documented by Kavin LeechLaura Walsh, CMA and reviewed by me for thoroughness and accuracy.   Mila Merryonald Kamiyah Kindel, MD  [facility]

## 2019-04-02 ENCOUNTER — Other Ambulatory Visit: Payer: Self-pay | Admitting: Family Medicine

## 2019-04-02 DIAGNOSIS — Z1231 Encounter for screening mammogram for malignant neoplasm of breast: Secondary | ICD-10-CM

## 2019-04-09 ENCOUNTER — Emergency Department
Admission: EM | Admit: 2019-04-09 | Discharge: 2019-04-09 | Disposition: A | Discharge status: Home / Self Care | Payer: Medicare Other | Attending: Emergency Medicine | Admitting: Emergency Medicine

## 2019-04-09 ENCOUNTER — Other Ambulatory Visit: Payer: Self-pay

## 2019-04-09 ENCOUNTER — Emergency Department: Payer: Medicare Other

## 2019-04-09 ENCOUNTER — Encounter: Payer: Self-pay | Admitting: Emergency Medicine

## 2019-04-09 DIAGNOSIS — W010XXA Fall on same level from slipping, tripping and stumbling without subsequent striking against object, initial encounter: Secondary | ICD-10-CM | POA: Diagnosis not present

## 2019-04-09 DIAGNOSIS — W19XXXA Unspecified fall, initial encounter: Secondary | ICD-10-CM

## 2019-04-09 DIAGNOSIS — I10 Essential (primary) hypertension: Secondary | ICD-10-CM | POA: Diagnosis not present

## 2019-04-09 DIAGNOSIS — Z7901 Long term (current) use of anticoagulants: Secondary | ICD-10-CM | POA: Insufficient documentation

## 2019-04-09 DIAGNOSIS — Z8673 Personal history of transient ischemic attack (TIA), and cerebral infarction without residual deficits: Secondary | ICD-10-CM | POA: Diagnosis not present

## 2019-04-09 DIAGNOSIS — Z7982 Long term (current) use of aspirin: Secondary | ICD-10-CM | POA: Diagnosis not present

## 2019-04-09 DIAGNOSIS — M79604 Pain in right leg: Secondary | ICD-10-CM | POA: Diagnosis present

## 2019-04-09 DIAGNOSIS — Z79899 Other long term (current) drug therapy: Secondary | ICD-10-CM | POA: Insufficient documentation

## 2019-04-09 NOTE — ED Notes (Signed)
See triage note  Presents s/p fall  Having pain to right lower leg  No deformity noted

## 2019-04-09 NOTE — ED Provider Notes (Signed)
[facility] Emergency Department Provider Note  ____________________________________________  Time seen: Approximately 2:35 PM  I have reviewed the triage vital signs and the nursing notes.   HISTORY  Chief Complaint Fall    HPI Amy Ray is a 73 y.o. female that presents to the emergency department for evaluation of right lower leg injury.  Patient states that she was trying to climb into bed when she thought her foot was on the stepstool but she missed and fell on her back this afternoon.  She thinks that her leg hit the bed.  She did not hit her head or lose consciousness.  She denies any headache, visual changes, dizziness, confusing.  She takes Plavix.  Incident happened about 2+ hours ago.  No neck pain, back pain, wrist pain   Past Medical History:  Diagnosis Date  . History of diverticulitis of colon 05/20/2015   per patient report   . Hyperlipidemia   . Hypertension   . Stroke (HCC)   . TIA (transient ischemic attack)   . Vertigo     Patient Active Problem List   Diagnosis Date Noted  . Low back pain 05/20/2015  . Fatigue 05/20/2015  . History of CVA (cerebrovascular accident) 05/20/2015  . Insomnia 05/20/2015  . Morbid obesity (HCC) 05/20/2015  . Positive H. pylori test 05/20/2015  . Tremor 05/20/2015  . Dysfunction of eustachian tube 05/20/2015  . Diverticulosis 05/20/2015  . Essential (primary) hypertension 07/17/2007  . Alterations of sensations, late effect of cerebrovascular disease 03/11/2005  . Hyperlipidemia 10/12/2003  . Trigeminal neuralgia 10/12/2003    Past Surgical History:  Procedure Laterality Date  . CHOLECYSTECTOMY  2000  . HERNIA REPAIR     umbilical hernia as a child  . REPLACEMENT TOTAL KNEE Bilateral     Prior to Admission medications   Medication Sig Start Date End Date Taking? Authorizing Provider  ALPRAZolam [name](XANAX) 0.5 MG tablet TAKE 1/2 TO 1 TABLET BY  MOUTH TWO TIMES DAILY 10/21/18   Malva LimesFisher, Donald  E, MD  aluminum-magnesium hydroxide-simethicone (MAALOX) 200-200-20 MG/5ML SUSP Take 30 mLs by mouth 4 (four) times daily -  before meals and at bedtime. 08/02/18   Sharman CheekStafford, Phillip, MD  aspirin 81 MG tablet Take 1 tablet by mouth daily.    [provider]  carbamazepine (TEGRETOL) 200 MG tablet TAKE 1 TABLET BY MOUTH TWICE DAILY 11/15/18   Malva LimesFisher, Donald E, MD  clopidogrel (PLAVIX) 75 MG tablet TAKE 1 TABLET(75 MG) BY MOUTH DAILY 11/13/18   Malva LimesFisher, Donald E, MD  diltiazem (DILT-XR) 180 MG 24 hr capsule TAKE 2 CAPSULES(360 MG) BY MOUTH DAILY 11/14/18   Malva LimesFisher, Donald E, MD  esomeprazole (NEXIUM) 20 MG capsule Take 20 mg by mouth daily at 12 noon.    [provider]  famotidine (PEPCID) 20 MG tablet Take 1 tablet (20 mg total) by mouth 2 (two) times daily. 08/02/18   Sharman CheekStafford, Phillip, MD  hydrochlorothiazide (HYDRODIURIL) 12.5 MG tablet Take 1 tablet (12.5 mg total) by mouth daily. 08/22/18   Malva LimesFisher, Donald E, MD  IRON PO Take 1 tablet by mouth daily.    [provider]  nortriptyline (PAMELOR) 10 MG capsule TAKE 1 TO 2 CAPSULES BY  MOUTH AT BEDTIME 02/26/19   Malva LimesFisher, Donald E, MD  simvastatin (ZOCOR) 20 MG tablet Take 1 tablet (20 mg total) by mouth daily. 09/14/18   Malva LimesFisher, Donald E, MD    Allergies Augmentin [amoxicillin-pot clavulanate], Quinapril-hydrochlorothiazide, and Tape  Family History  Problem Relation Age  of Onset  . Heart attack Mother   . Hypertension Mother   . Alzheimer's disease Father   . Diabetes Father   . Hypertension Sister   . Diabetes Brother   . Hypertension Sister   . Hypertension Sister   . Hypertension Sister   . Hypertension Sister   . Breast cancer Sister [id number]  . Breast cancer Maternal Aunt [id number]    Social History Social History   Tobacco Use  . Smoking status: Never Smoker  . Smokeless tobacco: Never Used  Substance Use Topics  . Alcohol use: No    Alcohol/week: 0.0 standard drinks  . Drug use: No     Review of Systems   Cardiovascular: No chest pain. Respiratory: No SOB. Gastrointestinal: No abdominal pain.  No nausea, no vomiting.  Musculoskeletal: Positive for leg pain. Skin: Negative for rash, abrasions, lacerations.positive for ecchymosis. Neurological: Negative for headaches, numbness or tingling   ____________________________________________   PHYSICAL EXAM:  VITAL SIGNS: ED Triage Vitals  Enc Vitals Group     BP 04/09/19 1349 (!) 126/54     Pulse Rate 04/09/19 1349 97     Resp 04/09/19 1349 18     Temp 04/09/19 1349 98.2 F (36.8 C)     Temp Source 04/09/19 1349 Oral     SpO2 04/09/19 1349 97 %     Weight 04/09/19 1350 210 lb (95.3 kg)     Height 04/09/19 1350 5' (1.524 m)     Head Circumference --      Peak Flow --      Pain Score 04/09/19 1350 5     Pain Loc --      Pain Edu? --      Excl. in GC? --      Constitutional: Alert and oriented. Well appearing and in no acute distress.  Talkative. Eyes: Conjunctivae are normal. PERRL. EOMI. Head: Atraumatic. ENT:      Ears:      Nose: No congestion/rhinnorhea.      Mouth/Throat: Mucous membranes are moist.  Neck: No stridor.  No cervical spine tenderness. Cardiovascular: Normal rate, regular rhythm.  Good peripheral circulation. Respiratory: Normal respiratory effort without tachypnea or retractions. Lungs CTAB. Good air entry to the bases with no decreased or absent breath sounds. Musculoskeletal: Full range of motion to all extremities. No gross deformities appreciated.  1 cm area of ecchymosis to right posterior shin. Neurologic:  Normal speech and language. No gross focal neurologic deficits are appreciated.  Skin:  Skin is warm, dry and intact. No rash noted. Psychiatric: Mood and affect are normal. Speech and behavior are normal. Patient exhibits appropriate insight and judgement.   ____________________________________________   LABS (all labs ordered are listed, but only abnormal results are displayed)  Labs  Reviewed - No data to display ____________________________________________  EKG   ____________________________________________  RADIOLOGY  Dg Tibia/fibula Right  Result Date: 04/09/2019 CLINICAL DATA:  Mechanical fall today with injury to RIGHT lower leg, pain and soft tissue swelling distally and laterally EXAM: RIGHT TIBIA AND FIBULA - 2 VIEW COMPARISON:  None. FINDINGS: RIGHT knee prosthesis. Osseous demineralization. Ankle joint alignment normal. No acute fracture, dislocation, or bone destruction. Scattered soft tissue phleboliths. Focal soft tissue swelling laterally at the distal RIGHT lower leg. IMPRESSION: No acute osseous abnormalities. RIGHT knee prosthesis and osseous demineralization. Electronically Signed   By: Ulyses SouthwardMark  Boles M.D.   On: 04/09/2019 14:33    ____________________________________________    PROCEDURES  Procedure(s) performed:    Procedures  Medications - No data to display   ____________________________________________   INITIAL IMPRESSION / ASSESSMENT AND PLAN / ED COURSE  Pertinent labs & imaging results that were available during my care of the patient were reviewed by me and considered in my medical decision making (see chart for details).  Review of the [place] CSRS was performed in accordance of the [place] prior to dispensing any controlled drugs.     Patient presented to the emergency department for evaluation of right lower leg injury after fall this afternoon.  Vital signs and exam are reassuring.  Patient did not hit her head or lose consciousness.  No headache, dizziness, confusion.  She does have an area of ecchymosis to her right lower leg that is painful.  Leg was Ace wrapped.  No osseous abnormality on x-ray.  Patient denies any additional pain. Patient is to follow up with PCP as directed. Patient is given ED precautions to return to the ED for any worsening or new symptoms.   Amy Ray was evaluated in Emergency Department on  04/09/2019 for the symptoms described in the history of present illness. She was evaluated in the context of the global COVID-19 pandemic, which necessitated consideration that the patient might be at risk for infection with the SARS-CoV-2 virus that causes COVID-19. Institutional protocols and algorithms that pertain to the evaluation of patients at risk for COVID-19 are in a state of rapid change based on information released by regulatory bodies including the CDC and federal and state organizations. These policies and algorithms were followed during the patient's care in the ED.  ____________________________________________  FINAL CLINICAL IMPRESSION(S) / ED DIAGNOSES  Final diagnoses:  Fall, initial encounter      NEW MEDICATIONS STARTED DURING THIS VISIT:  ED Discharge Orders    None          This chart was dictated using voice recognition software/Dragon. Despite best efforts to proofread, errors can occur which can change the meaning. Any change was purely unintentional.    Laban Emperor, PA-C 04/09/19 1531    Lavonia Drafts, MD 04/10/19 [phone]

## 2019-04-09 NOTE — ED Triage Notes (Signed)
Pt arrives post mechanical fall causing injury to pt's right lower leg.

## 2019-04-09 NOTE — Discharge Instructions (Addendum)
Please ice and elevate leg tonight.  Please follow-up with primary care in 2 days.  Please return the emergency department immediately for any worsening symptoms, headache, dizziness, confusion.

## 2019-04-11 ENCOUNTER — Telehealth: Payer: Self-pay | Admitting: Family Medicine

## 2019-04-11 NOTE — Chronic Care Management (AMB) (Signed)
Chronic Care Management   Note  04/11/2019 Name: Amy Ray MRN: [id number] DOB: 08-Jul-1946  Amy Ray is a 73 y.o. year old female who is a primary care patient of Fisher, Kirstie Peri, MD. I reached out to Nino Glow by phone today in response to a referral sent by Ms. [facility]'s health plan.    Ms. Civello was given information about Chronic Care Management services today including:  1. CCM service includes personalized support from designated clinical staff supervised by her physician, including individualized plan of care and coordination with other care providers 2. 24/7 contact phone numbers for assistance for urgent and routine care needs. 3. Service will only be billed when office clinical staff spend 20 minutes or more in a month to coordinate care. 4. Only one practitioner may furnish and bill the service in a calendar month. 5. The patient may stop CCM services at any time (effective at the end of the month) by phone call to the office staff. 6. The patient will be responsible for cost sharing (co-pay) of up to 20% of the service fee (after annual deductible is met).  Patient did not agree to enrollment in care management services and does not wish to consider at this time.  Follow up plan: The patient has been provided with contact information for the chronic care management team and has been advised to call with any health related questions or concerns.   [place]  [email or web address]   ??[phone]

## 2019-04-26 ENCOUNTER — Other Ambulatory Visit: Payer: Self-pay | Admitting: Family Medicine

## 2019-05-10 ENCOUNTER — Ambulatory Visit
Admission: RE | Admit: 2019-05-10 | Discharge: 2019-05-10 | Disposition: A | Discharge status: Home / Self Care | Payer: Medicare Other | Source: Ambulatory Visit | Attending: Family Medicine | Admitting: Family Medicine

## 2019-05-10 DIAGNOSIS — Z1231 Encounter for screening mammogram for malignant neoplasm of breast: Secondary | ICD-10-CM | POA: Insufficient documentation

## 2019-06-03 ENCOUNTER — Other Ambulatory Visit: Payer: Self-pay

## 2019-06-03 ENCOUNTER — Encounter: Payer: Self-pay | Admitting: Emergency Medicine

## 2019-06-03 ENCOUNTER — Emergency Department
Admission: EM | Admit: 2019-06-03 | Discharge: 2019-06-03 | Disposition: A | Discharge status: Home / Self Care | Payer: Medicare Other | Attending: Emergency Medicine | Admitting: Emergency Medicine

## 2019-06-03 DIAGNOSIS — Z7902 Long term (current) use of antithrombotics/antiplatelets: Secondary | ICD-10-CM | POA: Diagnosis not present

## 2019-06-03 DIAGNOSIS — Z91048 Other nonmedicinal substance allergy status: Secondary | ICD-10-CM | POA: Diagnosis not present

## 2019-06-03 DIAGNOSIS — Z7982 Long term (current) use of aspirin: Secondary | ICD-10-CM | POA: Diagnosis not present

## 2019-06-03 DIAGNOSIS — I10 Essential (primary) hypertension: Secondary | ICD-10-CM | POA: Insufficient documentation

## 2019-06-03 DIAGNOSIS — E785 Hyperlipidemia, unspecified: Secondary | ICD-10-CM | POA: Diagnosis not present

## 2019-06-03 DIAGNOSIS — Z8673 Personal history of transient ischemic attack (TIA), and cerebral infarction without residual deficits: Secondary | ICD-10-CM | POA: Insufficient documentation

## 2019-06-03 DIAGNOSIS — Z79899 Other long term (current) drug therapy: Secondary | ICD-10-CM | POA: Diagnosis not present

## 2019-06-03 DIAGNOSIS — Z888 Allergy status to other drugs, medicaments and biological substances status: Secondary | ICD-10-CM | POA: Diagnosis not present

## 2019-06-03 DIAGNOSIS — R251 Tremor, unspecified: Secondary | ICD-10-CM | POA: Diagnosis not present

## 2019-06-03 DIAGNOSIS — Z880 Allergy status to penicillin: Secondary | ICD-10-CM | POA: Insufficient documentation

## 2019-06-03 LAB — CBC
HCT: 39.4 % (ref 36.0–46.0)
Hemoglobin: 13 g/dL (ref 12.0–15.0)
MCH: 32.9 pg (ref 26.0–34.0)
MCHC: 33 g/dL (ref 30.0–36.0)
MCV: 99.7 fL (ref 80.0–100.0)
Platelets: 204 10*3/uL (ref 150–400)
RBC: 3.95 MIL/uL (ref 3.87–5.11)
RDW: 11.6 % (ref 11.5–15.5)
WBC: 5.8 10*3/uL (ref 4.0–10.5)
nRBC: 0 % (ref 0.0–0.2)

## 2019-06-03 LAB — URINALYSIS, COMPLETE (UACMP) WITH MICROSCOPIC
Bacteria, UA: NONE SEEN
Bilirubin Urine: NEGATIVE
Glucose, UA: NEGATIVE mg/dL
Hgb urine dipstick: NEGATIVE
Ketones, ur: NEGATIVE mg/dL
Leukocytes,Ua: NEGATIVE
Nitrite: NEGATIVE
Protein, ur: NEGATIVE mg/dL
Specific Gravity, Urine: 1.014 (ref 1.005–1.030)
pH: 5 (ref 5.0–8.0)

## 2019-06-03 LAB — BASIC METABOLIC PANEL
Anion gap: 10 (ref 5–15)
BUN: 15 mg/dL (ref 8–23)
CO2: 25 mmol/L (ref 22–32)
Calcium: 9 mg/dL (ref 8.9–10.3)
Chloride: 106 mmol/L (ref 98–111)
Creatinine, Ser: 0.85 mg/dL (ref 0.44–1.00)
GFR calc Af Amer: >60 mL/min (ref >60)
GFR calc non Af Amer: >60 mL/min (ref >60)
Glucose, Bld: 91 mg/dL (ref 70–99)
Potassium: 3.3 mmol/L — ABNORMAL LOW (ref 3.5–5.1)
Sodium: 141 mmol/L (ref 135–145)

## 2019-06-03 LAB — CARBAMAZEPINE LEVEL, TOTAL: Carbamazepine Lvl: 10.8 ug/mL (ref 4.0–12.0)

## 2019-06-03 MED ORDER — POTASSIUM CHLORIDE CRYS ER 20 MEQ PO TBCR
40.0000 meq | EXTENDED_RELEASE_TABLET | Freq: Once | ORAL | Status: AC
  Administered 2019-06-03: 40 meq via ORAL
  Filled 2019-06-03: qty 2

## 2019-06-03 NOTE — ED Notes (Signed)
Pt assisted to use bedside toilet

## 2019-06-03 NOTE — ED Provider Notes (Signed)
[facility] Emergency Department Provider Note  ____________________________________________   First MD Initiated Contact with Patient 06/03/19 [phone]     (approximate)  I have reviewed the triage vital signs and the nursing notes.   HISTORY  Chief Complaint Hypertension and Shaking    HPI Amy Ray is a 73 y.o. female with past medical history as below here with intermittent shaking.  Patient states that over the last week, she has been doing much more than usual.  She states that she has been trying to get her house ready and has been cooking for her family.  She states that intermittently, she feels somewhat weak and feels like she is shaking.  She denies any actual seizure-like activity.  No chest pain, shortness of breath, diaphoresis, or other symptoms with this.  They seem to come and go randomly.  These been ongoing for at least several months, but are worse over the last week.  She now feels completely better after resting in the ED and is requesting discharge.  Denies any other recent medication changes.  They do not seem to be associate with any headache.  No positional changes.  No other complaints.        Past Medical History:  Diagnosis Date  . History of diverticulitis of colon 05/20/2015   per patient report   . Hyperlipidemia   . Hypertension   . Stroke ([place])   . TIA (transient ischemic attack)   . Vertigo     Patient Active Problem List   Diagnosis Date Noted  . Low back pain 05/20/2015  . Fatigue 05/20/2015  . History of CVA (cerebrovascular accident) 05/20/2015  . Insomnia 05/20/2015  . Morbid obesity ([place]) 05/20/2015  . Positive H. pylori test 05/20/2015  . Tremor 05/20/2015  . Dysfunction of eustachian tube 05/20/2015  . Diverticulosis 05/20/2015  . Essential (primary) hypertension 07/17/2007  . Alterations of sensations, late effect of cerebrovascular disease 03/11/2005  . Hyperlipidemia 10/12/2003  . Trigeminal  neuralgia 10/12/2003    Past Surgical History:  Procedure Laterality Date  . CHOLECYSTECTOMY  2000  . HERNIA REPAIR     umbilical hernia as a child  . REPLACEMENT TOTAL KNEE Bilateral     Prior to Admission medications   Medication Sig Start Date End Date Taking? Authorizing Provider  ALPRAZolam Duanne Moron) 0.5 MG tablet TAKE 1/2 TO 1 TABLET BY  MOUTH TWO TIMES DAILY 04/26/19   Birdie Sons, MD  aluminum-magnesium hydroxide-simethicone (MAALOX) 200-200-20 MG/5ML SUSP Take 30 mLs by mouth 4 (four) times daily -  before meals and at bedtime. 08/02/18   Carrie Mew, MD  aspirin 81 MG tablet Take 1 tablet by mouth daily.    [provider]  carbamazepine (TEGRETOL) 200 MG tablet TAKE 1 TABLET BY MOUTH TWICE DAILY 11/15/18   Birdie Sons, MD  clopidogrel (PLAVIX) 75 MG tablet TAKE 1 TABLET(75 MG) BY MOUTH DAILY 11/13/18   Birdie Sons, MD  diltiazem (DILT-XR) 180 MG 24 hr capsule TAKE 2 CAPSULES(360 MG) BY MOUTH DAILY 11/14/18   Birdie Sons, MD  esomeprazole (NEXIUM) 20 MG capsule Take 20 mg by mouth daily at 12 noon.    [provider]  famotidine (PEPCID) 20 MG tablet Take 1 tablet (20 mg total) by mouth 2 (two) times daily. 08/02/18   Carrie Mew, MD  hydrochlorothiazide (HYDRODIURIL) 12.5 MG tablet Take 1 tablet (12.5 mg total) by mouth daily. 08/22/18   Birdie Sons, MD  IRON PO Take  1 tablet by mouth daily.    [provider]  nortriptyline (PAMELOR) 10 MG capsule TAKE 1 TO 2 CAPSULES BY  MOUTH AT BEDTIME 02/26/19   Malva LimesFisher, Donald E, MD  simvastatin (ZOCOR) 20 MG tablet Take 1 tablet (20 mg total) by mouth daily. 09/14/18   Malva LimesFisher, Donald E, MD    Allergies Augmentin [amoxicillin-pot clavulanate], Quinapril-hydrochlorothiazide, and Tape  Family History  Problem Relation Age of Onset  . Heart attack Mother   . Hypertension Mother   . Alzheimer's disease Father   . Diabetes Father   . Hypertension Sister   . Diabetes Brother   .  Hypertension Sister   . Hypertension Sister   . Hypertension Sister   . Hypertension Sister   . Breast cancer Sister [id number]  . Breast cancer Maternal Aunt [phone]    Social History Social History   Tobacco Use  . Smoking status: Never Smoker  . Smokeless tobacco: Never Used  Substance Use Topics  . Alcohol use: No    Alcohol/week: 0.0 standard drinks  . Drug use: No    Review of Systems  Review of Systems  Constitutional: Positive for fatigue. Negative for fever.  HENT: Negative for congestion and sore throat.   Eyes: Negative for visual disturbance.  Respiratory: Negative for cough and shortness of breath.   Cardiovascular: Negative for chest pain.  Gastrointestinal: Negative for abdominal pain, diarrhea, nausea and vomiting.  Genitourinary: Negative for flank pain.  Musculoskeletal: Negative for back pain and neck pain.  Skin: Negative for rash and wound.  Neurological: Positive for weakness.  All other systems reviewed and are negative.    ____________________________________________  PHYSICAL EXAM:      VITAL SIGNS: ED Triage Vitals  Enc Vitals Group     BP 06/03/19 1413 (!) 150/68     Pulse Rate 06/03/19 1413 95     Resp 06/03/19 1413 18     Temp 06/03/19 1413 99.3 F (37.4 C)     Temp Source 06/03/19 1413 Oral     SpO2 06/03/19 1413 99 %     Weight 06/03/19 1413 214 lb (97.1 kg)     Height 06/03/19 1413 5' (1.524 m)     Head Circumference --      Peak Flow --      Pain Score 06/03/19 1412 0     Pain Loc --      Pain Edu? --      Excl. in GC? --      Physical Exam Vitals signs and nursing note reviewed.  Constitutional:      General: She is not in acute distress.    Appearance: She is well-developed.  HENT:     Head: Normocephalic and atraumatic.  Eyes:     Conjunctiva/sclera: Conjunctivae normal.  Neck:     Musculoskeletal: Neck supple.  Cardiovascular:     Rate and Rhythm: Normal rate and regular rhythm.     Heart sounds: Normal heart sounds. No  murmur. No friction rub.  Pulmonary:     Effort: Pulmonary effort is normal. No respiratory distress.     Breath sounds: Normal breath sounds. No wheezing or rales.  Abdominal:     General: There is no distension.     Palpations: Abdomen is soft.     Tenderness: There is no abdominal tenderness.  Skin:    General: Skin is warm.     Capillary Refill: Capillary refill takes less than 2 seconds.  Neurological:  General: No focal deficit present.     Mental Status: She is alert and oriented to person, place, and time.     GCS: GCS eye subscore is 4. GCS verbal subscore is 5. GCS motor subscore is 6.     Cranial Nerves: Cranial nerves are intact.     Sensory: Sensation is intact.     Motor: Motor function is intact. No abnormal muscle tone.       ____________________________________________   LABS (all labs ordered are listed, but only abnormal results are displayed)  Labs Reviewed  BASIC METABOLIC PANEL - Abnormal; Notable for the following components:      Result Value   Potassium 3.3 (*)    All other components within normal limits  URINALYSIS, COMPLETE (UACMP) WITH MICROSCOPIC - Abnormal; Notable for the following components:   Color, Urine YELLOW (*)    APPearance CLEAR (*)    All other components within normal limits  CBC  CARBAMAZEPINE LEVEL, TOTAL    ____________________________________________  EKG: Normal sinus rhythm, VR 79. Baseline artifact. QTc 492. No acute ischemic changes. ________________________________________  RADIOLOGY All imaging, including plain films, CT scans, and ultrasounds, independently reviewed by me, and interpretations confirmed via formal radiology reads.  ED MD interpretation:   None  Official radiology report(s): No results found.  ____________________________________________  PROCEDURES   Procedure(s) performed (including Critical Care):  Procedures  ____________________________________________  INITIAL IMPRESSION /  MDM / ASSESSMENT AND PLAN / ED COURSE  As part of my medical decision making, I reviewed the following data within the electronic MEDICAL RECORD NUMBER Notes from prior ED visits and [place] Controlled Substance Database      *Amy Ray was evaluated in Emergency Department on 06/03/2019 for the symptoms described in the history of present illness. She was evaluated in the context of the global COVID-19 pandemic, which necessitated consideration that the patient might be at risk for infection with the SARS-CoV-2 virus that causes COVID-19. Institutional protocols and algorithms that pertain to the evaluation of patients at risk for COVID-19 are in a state of rapid change based on information released by regulatory bodies including the CDC and federal and state organizations. These policies and algorithms were followed during the patient's care in the ED.  Some ED evaluations and interventions may be delayed as a result of limited staffing during the pandemic.*      Medical Decision Making: 73 yo F here with intermittent episodes of generalized shaking. DDx includes tremors, possible side effect of tegretol, dehydration, ?orthostasis. Labs are very reassuring. Mild hypokalemia noted but no anemia, normal WBC, no other emergent pathology identified. She is afebrile, no signs of UTI. Satting well onR A with no cough. EKG is non-ischemic. She feels completely better after relaxing in ED and is requesting d/c. Unclear etiology. Will check an outpt tegretol level, encourage fluids and potassium rich foods, d/c home.  ____________________________________________  FINAL CLINICAL IMPRESSION(S) / ED DIAGNOSES  Final diagnoses:  Occasional tremors     MEDICATIONS GIVEN DURING THIS VISIT:  Medications  potassium chloride SA (K-DUR) CR tablet 40 mEq (40 mEq Oral Given 06/03/19 1708)     ED Discharge Orders    None       Note:  This document was prepared using Dragon voice recognition software and may  include unintentional dictation errors.   Shaune PollackIsaacs, Anjalee Cope, MD 06/03/19 [phone]

## 2019-06-03 NOTE — ED Notes (Signed)
This RN attempted butterfly stick x2 for red tube. Lamont from lab at bedside.

## 2019-06-03 NOTE — ED Notes (Signed)
Pt resting comfortably

## 2019-06-03 NOTE — ED Triage Notes (Signed)
Pt arrived via POV with reports of elevated blood pressure and shaking since last night. Pt states BP at home was 189/101. Pt states she usually has a tremor. PT states she feels nervous.

## 2019-06-03 NOTE — ED Notes (Signed)
Pt's grandson to bedside.

## 2019-06-03 NOTE — Discharge Instructions (Addendum)
Eat foods high in potassium, such as bananas, for the next week  Drink plenty of fluids  I have sent an outpatient level of your Tegretol. I'd recommend seeing your doctor in the next week to follow this up and discuss further work-up.  Be careful when going from sitting to standing.

## 2019-06-03 NOTE — ED Notes (Addendum)
Pt states she feels nervous and thinks this is why she has been so shaky. Denies pain. States history of HTN and has taken her daily meds for this. EMS student assisting to capture EKG. Pt understands urine sample is needed and agrees to noitfy this RN when she can provide urine sample.

## 2019-06-03 NOTE — ED Notes (Addendum)
Lab states red tube needed for carbamazepine level. Will send red tube to lab.

## 2019-06-03 NOTE — ED Notes (Signed)
Gwen in the lab sending phlebotomy to collect red tube as EMS student tried twice without success. This RN attempting now. Pt hard stick.

## 2019-06-03 NOTE — ED Notes (Signed)
Pt refused to sign for d/c d/t ride needing to get home.

## 2019-06-21 ENCOUNTER — Other Ambulatory Visit: Payer: Self-pay | Admitting: Family Medicine

## 2019-06-21 NOTE — Progress Notes (Signed)
Subjective:   Lynita LombardHelena Holsomback is a 73 y.o. female who presents for Medicare Annual (Subsequent) preventive examination.    This visit is being conducted through telemedicine due to the COVID-19 pandemic. This patient has given me verbal consent via doximity to conduct this visit, patient states they are participating from their home address. Some vital signs may be absent or patient reported.    Patient identification: identified by name, DOB, and current address  Review of Systems:  N/A  Cardiac Risk Factors include: advanced age (>3355men, 33>65 women);dyslipidemia;hypertension     Objective:     Vitals: There were no vitals taken for this visit.  There is no height or weight on file to calculate BMI. Unable to obtain vitals due to visit being conducted via telephonically.   Advanced Directives 06/25/2019 06/03/2019 04/09/2019 10/10/2018 08/02/2018 06/21/2018 10/15/2017  Does Patient Have a Medical Advance Directive? No No No No No No No  Would patient like information on creating a medical advance directive? No - Patient declined No - Patient declined No - Patient declined No - Patient declined No - Patient declined No - Patient declined -    Tobacco Social History   Tobacco Use  Smoking Status Never Smoker  Smokeless Tobacco Never Used     Counseling given: Not Answered   Clinical Intake:  Pre-visit preparation completed: Yes  Pain : No/denies pain Pain Score: 0-No pain     Nutritional Risks: None Diabetes: No  How often do you need to have someone help you when you read instructions, pamphlets, or other written materials from your doctor or pharmacy?: 1 - Never  Interpreter Needed?: No  Information entered by :: [facility] LLCMmarkoski, LPN  Past Medical History:  Diagnosis Date  . History of diverticulitis of colon 05/20/2015   per patient report   . Hyperlipidemia   . Hypertension   . Stroke ([facility])   . TIA (transient ischemic attack)   . Vertigo    Past Surgical History:   Procedure Laterality Date  . CHOLECYSTECTOMY  2000  . HERNIA REPAIR     umbilical hernia as a child  . REPLACEMENT TOTAL KNEE Bilateral    Family History  Problem Relation Age of Onset  . Heart attack Mother   . Hypertension Mother   . Alzheimer's disease Father   . Diabetes Father   . Hypertension Sister   . Diabetes Brother   . Hypertension Sister   . Hypertension Sister   . Hypertension Sister   . Hypertension Sister   . Breast cancer Sister [phone]  . Breast cancer Maternal Aunt [phone]   Social History   Socioeconomic History  . Marital status: Married    Spouse name: Not on file  . Number of children: 4  . Years of education: Not on file  . Highest education level: High school graduate  Occupational History  . Occupation: Retired  Engineer, productionocial Needs  . Financial resource strain: Not hard at all  . Food insecurity    Worry: Never true    Inability: Never true  . Transportation needs    Medical: No    Non-medical: No  Tobacco Use  . Smoking status: Never Smoker  . Smokeless tobacco: Never Used  Substance and Sexual Activity  . Alcohol use: No    Alcohol/week: 0.0 standard drinks  . Drug use: No  . Sexual activity: Not on file  Lifestyle  . Physical activity    Days per week: 0 days    Minutes per  session: 0 min  . Stress: Only a little  Relationships  . Social Musician on phone: Patient refused    Gets together: Patient refused    Attends religious service: Patient refused    Active member of club or organization: Patient refused    Attends meetings of clubs or organizations: Patient refused    Relationship status: Patient refused  Other Topics Concern  . Not on file  Social History Narrative  . Not on file    Outpatient Encounter Medications as of 06/25/2019  Medication Sig  . ALPRAZolam (XANAX) 0.5 MG tablet TAKE 1/2 TO 1 TABLET BY  MOUTH TWO TIMES DAILY  . aluminum-magnesium hydroxide-simethicone (MAALOX) 200-200-20 MG/5ML SUSP Take 30 mLs by  mouth 4 (four) times daily -  before meals and at bedtime. (Patient taking differently: Take 30 mLs by mouth 4 (four) times daily -  before meals and at bedtime. As needed)  . aspirin 81 MG tablet Take 1 tablet by mouth daily.  . carbamazepine (TEGRETOL) 200 MG tablet TAKE 1 TABLET BY MOUTH TWICE DAILY  . clopidogrel (PLAVIX) 75 MG tablet TAKE 1 TABLET(75 MG) BY MOUTH DAILY  . diltiazem (DILT-XR) 180 MG 24 hr capsule TAKE 2 CAPSULES(360 MG) BY MOUTH DAILY  . esomeprazole (NEXIUM) 20 MG capsule Take 20 mg by mouth daily at 12 noon.  . hydrochlorothiazide (HYDRODIURIL) 12.5 MG tablet Take 1 tablet (12.5 mg total) by mouth daily.  . nortriptyline (PAMELOR) 10 MG capsule TAKE 1 TO 2 CAPSULES BY  MOUTH AT BEDTIME  . simvastatin (ZOCOR) 20 MG tablet Take 1 tablet (20 mg total) by mouth daily.  . famotidine (PEPCID) 20 MG tablet Take 1 tablet (20 mg total) by mouth 2 (two) times daily. (Patient not taking: Reported on 06/25/2019)  . IRON PO Take 1 tablet by mouth daily.  . [DISCONTINUED] carbamazepine (TEGRETOL) 200 MG tablet TAKE 1 TABLET BY MOUTH TWICE DAILY   No facility-administered encounter medications on file as of 06/25/2019.     Activities of Daily Living In your present state of health, do you have any difficulty performing the following activities: 06/25/2019  Hearing? N  Vision? Y  Comment Needs cataracts removed.  Difficulty concentrating or making decisions? N  Walking or climbing stairs? N  Dressing or bathing? N  Doing errands, shopping? N  Preparing Food and eating ? N  Using the Toilet? N  In the past six months, have you accidently leaked urine? N  Do you have problems with loss of bowel control? N  Managing your Medications? N  Managing your Finances? N  Housekeeping or managing your Housekeeping? N  Some recent data might be hidden    Patient Care Team: Malva Limes, MD as PCP - General (Family Medicine) Isla Pence, OD as Consulting Physician (Optometry)  Deeann Saint, MD (Orthopedic Surgery) Alwyn Pea, MD as Consulting Physician (Cardiology)    Assessment:   This is a routine wellness examination for [place].  Exercise Activities and Dietary recommendations Current Exercise Habits: The patient does not participate in regular exercise at present, Exercise limited by: orthopedic condition(s)  Goals    . DIET - EAT MORE VEGETABLES     Recommend increasing amount of vegetables to 2-3 servings a day.    . Exercise 3x per week (30 min per time)     Recommend to exercise for 3 days a week for at least 30 minutes at a time.     . Increase lean  proteins     Recommend to continue eating lean meats only and no fried meats.     Marland Kitchen. LIFESTYLE - DECREASE FALLS RISK     Recommend to remove any items from the home that may cause slips or trips.       Fall Risk: Fall Risk  06/25/2019 06/21/2018 06/16/2017 01/17/2017  Falls in the past year? 1 Yes Yes Yes  Number falls in past yr: 1 2 or more 2 or more 1  Injury with Fall? 0 No No No  Risk for fall due to : - - - History of fall(s)  Follow up Falls prevention discussed Falls prevention discussed Falls prevention discussed Falls prevention discussed    FALL RISK PREVENTION PERTAINING TO THE HOME:  Any stairs in or around the home? Yes  If so, are there any without handrails? No   Home free of loose throw rugs in walkways, pet beds, electrical cords, etc? Yes  Adequate lighting in your home to reduce risk of falls? Yes   ASSISTIVE DEVICES UTILIZED TO PREVENT FALLS:  Life alert? No  Use of a cane, walker or w/c? Yes  Grab bars in the bathroom? No  Shower chair or bench in shower? Yes  Elevated toilet seat or a handicapped toilet? Yes    TIMED UP AND GO:  Was the test performed? No .    Depression Screen PHQ 2/9 Scores 06/25/2019 06/21/2018 06/16/2017 01/17/2017  PHQ - 2 Score 0 1 2 0  PHQ- 9 Score - - 4 3     Cognitive Function     6CIT Screen 06/25/2019  What Year? 0 points   What month? 0 points  What time? 0 points  Count back from 20 0 points  Months in reverse 0 points  Repeat phrase 2 points  Total Score 2    Immunization History  Administered Date(s) Administered  . Influenza, High Dose Seasonal PF 07/12/2016, 06/16/2017, 06/21/2018  . Influenza-Unspecified 05/12/2015  . Pneumococcal Conjugate-13 12/05/2014  . Pneumococcal Polysaccharide-23 05/09/2013  . Tdap 08/12/2011    Qualifies for Shingles Vaccine? Yes . Due for Shingrix. Education has been provided regarding the importance of this vaccine. Pt has been advised to call insurance company to determine out of pocket expense. Advised may also receive vaccine at local pharmacy or Health Dept. Verbalized acceptance and understanding.  Tdap: Up to date  Flu Vaccine: Up to date  Pneumococcal Vaccine: Completed series  Screening Tests Health Maintenance  Topic Date Due  . DEXA SCAN  01/05/2019  . COLONOSCOPY  06/26/2019  . MAMMOGRAM  05/09/2021  . TETANUS/TDAP  08/11/2021  . INFLUENZA VACCINE  Completed  . Hepatitis C Screening  Completed  . PNA vac Low Risk Adult  Completed    Cancer Screenings:  Colorectal Screening: Completed 06/25/09. Repeat every 10 years.  Mammogram: Completed 05/10/19.   Bone Density: Completed 01/04/14. Results reflect OSTEOPENIA. Repeat every 5 years. Ordered today. Pt provided with contact info and advised to call to schedule appt. Pt aware the office will call re: appt.  Lung Cancer Screening: (Low Dose CT Chest recommended if Age 77-80 years, 30 pack-year currently smoking OR have quit w/in 15years.) does not qualify.   Additional Screening:  Hepatitis C Screening: Up to date  Vision Screening: Recommended annual ophthalmology exams for early detection of glaucoma and other disorders of the eye.  Dental Screening: Recommended annual dental exams for proper oral hygiene  Community Resource Referral:  CRR required this visit?  No  Plan:  I have  personally reviewed and addressed the Medicare Annual Wellness questionnaire and have noted the following in the patient's chart:  A. Medical and social history B. Use of alcohol, tobacco or illicit drugs  C. Current medications and supplements D. Functional ability and status E.  Nutritional status F.  Physical activity G. Advance directives H. List of other physicians I.  Hospitalizations, surgeries, and ER visits in previous 12 months J.  [place] such as hearing and vision if needed, cognitive and depression L. Referrals and appointments   In addition, I have reviewed and discussed with patient certain preventive protocols, quality metrics, and best practice recommendations. A written personalized care plan for preventive services as well as general preventive health recommendations were provided to patient. Nurse Health Advisor  Signed,    Kalib Bhagat [place], [place]  [phone] Nurse Health Advisor   Nurse Notes: None.

## 2019-06-25 ENCOUNTER — Other Ambulatory Visit: Payer: Self-pay

## 2019-06-25 ENCOUNTER — Ambulatory Visit (INDEPENDENT_AMBULATORY_CARE_PROVIDER_SITE_OTHER): Payer: Medicare Other

## 2019-06-25 DIAGNOSIS — Z1211 Encounter for screening for malignant neoplasm of colon: Secondary | ICD-10-CM

## 2019-06-25 DIAGNOSIS — Z0000 Encounter for general adult medical examination without abnormal findings: Secondary | ICD-10-CM | POA: Diagnosis not present

## 2019-06-25 DIAGNOSIS — E2839 Other primary ovarian failure: Secondary | ICD-10-CM | POA: Diagnosis not present

## 2019-06-25 NOTE — Patient Instructions (Signed)
Amy Ray , Thank you for taking time to come for your Medicare Wellness Visit. I appreciate your ongoing commitment to your health goals. Please review the following plan we discussed and let me know if I can assist you in the future.   Screening recommendations/referrals: Colonoscopy: Due 06/26/19, ordered today. Pt provided with contact info and advised to call to schedule appt. Pt aware the office will call re: appt. Mammogram: Up to date, due 04/2020 Bone Density: Ordered today. Pt provided with contact info and advised to call to schedule appt. Pt aware the office will call re: appt. Recommended yearly ophthalmology/optometry visit for glaucoma screening and checkup Recommended yearly dental visit for hygiene and checkup  Vaccinations: Influenza vaccine: Currently due  Pneumococcal vaccine: Completed series Tdap vaccine: Up to date, due 08/2021 Shingles vaccine: Pt declines today.     Advanced directives: Advance directive discussed with you today. Even though you declined this today please call our office should you change your mind and we can give you the proper paperwork for you to fill out.  Conditions/risks identified: Fall risk prevention discussed and recommend to exercise 3 days a week for at least 30 minutes.   Next appointment: 07/30/19 with Dr Caryn Section.    Preventive Care 73 Years and Older, Female Preventive care refers to lifestyle choices and visits with your health care provider that can promote health and wellness. What does preventive care include?  A yearly physical exam. This is also called an annual well check.  Dental exams once or twice a year.  Routine eye exams. Ask your health care provider how often you should have your eyes checked.  Personal lifestyle choices, including:  Daily care of your teeth and gums.  Regular physical activity.  Eating a healthy diet.  Avoiding tobacco and drug use.  Limiting alcohol use.  Practicing safe sex.   Taking low-dose aspirin every day.  Taking vitamin and mineral supplements as recommended by your health care provider. What happens during an annual well check? The services and screenings done by your health care provider during your annual well check will depend on your age, overall health, lifestyle risk factors, and family history of disease. Counseling  Your health care provider may ask you questions about your:  Alcohol use.  Tobacco use.  Drug use.  Emotional well-being.  Home and relationship well-being.  Sexual activity.  Eating habits.  History of falls.  Memory and ability to understand (cognition).  Work and work Statistician.  Reproductive health. Screening  You may have the following tests or measurements:  Height, weight, and BMI.  Blood pressure.  Lipid and cholesterol levels. These may be checked every 5 years, or more frequently if you are over 9 years old.  Skin check.  Lung cancer screening. You may have this screening every year starting at age 3 if you have a 30-pack-year history of smoking and currently smoke or have quit within the past 15 years.  Fecal occult blood test (FOBT) of the stool. You may have this test every year starting at age 27.  Flexible sigmoidoscopy or colonoscopy. You may have a sigmoidoscopy every 5 years or a colonoscopy every 10 years starting at age 4.  Hepatitis C blood test.  Hepatitis B blood test.  Sexually transmitted disease (STD) testing.  Diabetes screening. This is done by checking your blood sugar (glucose) after you have not eaten for a while (fasting). You may have this done every 1-3 years.  Bone density scan. This is  done to screen for osteoporosis. You may have this done starting at age 52.  Mammogram. This may be done every 1-2 years. Talk to your health care provider about how often you should have regular mammograms. Talk with your health care provider about your test results, treatment  options, and if necessary, the need for more tests. Vaccines  Your health care provider may recommend certain vaccines, such as:  Influenza vaccine. This is recommended every year.  Tetanus, diphtheria, and acellular pertussis (Tdap, Td) vaccine. You may need a Td booster every 10 years.  Zoster vaccine. You may need this after age 40.  Pneumococcal 13-valent conjugate (PCV13) vaccine. One dose is recommended after age 107.  Pneumococcal polysaccharide (PPSV23) vaccine. One dose is recommended after age 80. Talk to your health care provider about which screenings and vaccines you need and how often you need them. This information is not intended to replace advice given to you by your health care provider. Make sure you discuss any questions you have with your health care provider. Document Released: 10/24/2015 Document Revised: 06/16/2016 Document Reviewed: 07/29/2015 Elsevier Interactive Patient Education  2017 [employer].  Fall Prevention in the Home Falls can cause injuries. They can happen to people of all ages. There are many things you can do to make your home safe and to help prevent falls. What can I do on the outside of my home?  Regularly fix the edges of walkways and driveways and fix any cracks.  Remove anything that might make you trip as you walk through a door, such as a raised step or threshold.  Trim any bushes or trees on the path to your home.  Use bright outdoor lighting.  Clear any walking paths of anything that might make someone trip, such as rocks or tools.  Regularly check to see if handrails are loose or broken. Make sure that both sides of any steps have handrails.  Any raised decks and porches should have guardrails on the edges.  Have any leaves, snow, or ice cleared regularly.  Use sand or salt on walking paths during winter.  Clean up any spills in your garage right away. This includes oil or grease spills. What can I do in the bathroom?   Use night lights.  Install grab bars by the toilet and in the tub and shower. Do not use towel bars as grab bars.  Use non-skid mats or decals in the tub or shower.  If you need to sit down in the shower, use a plastic, non-slip stool.  Keep the floor dry. Clean up any water that spills on the floor as soon as it happens.  Remove soap buildup in the tub or shower regularly.  Attach bath mats securely with double-sided non-slip rug tape.  Do not have throw rugs and other things on the floor that can make you trip. What can I do in the bedroom?  Use night lights.  Make sure that you have a light by your bed that is easy to reach.  Do not use any sheets or blankets that are too big for your bed. They should not hang down onto the floor.  Have a firm chair that has side arms. You can use this for support while you get dressed.  Do not have throw rugs and other things on the floor that can make you trip. What can I do in the kitchen?  Clean up any spills right away.  Avoid walking on wet floors.  Keep  items that you use a lot in easy-to-reach places.  If you need to reach something above you, use a strong step stool that has a grab bar.  Keep electrical cords out of the way.  Do not use floor polish or wax that makes floors slippery. If you must use wax, use non-skid floor wax.  Do not have throw rugs and other things on the floor that can make you trip. What can I do with my stairs?  Do not leave any items on the stairs.  Make sure that there are handrails on both sides of the stairs and use them. Fix handrails that are broken or loose. Make sure that handrails are as long as the stairways.  Check any carpeting to make sure that it is firmly attached to the stairs. Fix any carpet that is loose or worn.  Avoid having throw rugs at the top or bottom of the stairs. If you do have throw rugs, attach them to the floor with carpet tape.  Make sure that you have a light switch  at the top of the stairs and the bottom of the stairs. If you do not have them, ask someone to add them for you. What else can I do to help prevent falls?  Wear shoes that:  Do not have high heels.  Have rubber bottoms.  Are comfortable and fit you well.  Are closed at the toe. Do not wear sandals.  If you use a stepladder:  Make sure that it is fully opened. Do not climb a closed stepladder.  Make sure that both sides of the stepladder are locked into place.  Ask someone to hold it for you, if possible.  Clearly mark and make sure that you can see:  Any grab bars or handrails.  First and last steps.  Where the edge of each step is.  Use tools that help you move around (mobility aids) if they are needed. These include:  Canes.  Walkers.  Scooters.  Crutches.  Turn on the lights when you go into a dark area. Replace any light bulbs as soon as they burn out.  Set up your furniture so you have a clear path. Avoid moving your furniture around.  If any of your floors are uneven, fix them.  If there are any pets around you, be aware of where they are.  Review your medicines with your doctor. Some medicines can make you feel dizzy. This can increase your chance of falling. Ask your doctor what other things that you can do to help prevent falls. This information is not intended to replace advice given to you by your health care provider. Make sure you discuss any questions you have with your health care provider. Document Released: 07/24/2009 Document Revised: 03/04/2016 Document Reviewed: 11/01/2014 Elsevier Interactive Patient Education  2017 [employer]

## 2019-06-27 ENCOUNTER — Telehealth: Payer: Self-pay

## 2019-06-27 ENCOUNTER — Other Ambulatory Visit: Payer: Self-pay

## 2019-06-27 DIAGNOSIS — Z1211 Encounter for screening for malignant neoplasm of colon: Secondary | ICD-10-CM

## 2019-06-27 MED ORDER — NA SULFATE-K SULFATE-MG SULF 17.5-3.13-1.6 GM/177ML PO SOLN: 1.0000 | Freq: Once | ORAL | 0 refills | Status: AC

## 2019-06-27 NOTE — Telephone Encounter (Signed)
Gastroenterology Pre-Procedure Review  Request Date: 07/10/19 Requesting Physician: Dr. Allen Norris  PATIENT REVIEW QUESTIONS: The patient responded to the following health history questions as indicated:    1. Are you having any GI issues? no 2. Do you have a personal history of Polyps? no 3. Do you have a family history of Colon Cancer or Polyps? no 4. Diabetes Mellitus? no 5. Joint replacements in the past 12 months?no 6. Major health problems in the past 3 months?no 7. Any artificial heart valves, MVP, or defibrillator?no    MEDICATIONS & ALLERGIES:    Patient reports the following regarding taking any anticoagulation/antiplatelet therapy:   Plavix, Coumadin, Eliquis, Xarelto, Lovenox, Pradaxa, Brilinta, or Effient? yes (Plavix Blood Thinner request sent to Dr. Maralyn Sago office) Aspirin? yes (81 mg daily)  Patient confirms/reports the following medications:  Current Outpatient Medications  Medication Sig Dispense Refill  . ALPRAZolam (XANAX) 0.5 MG tablet TAKE 1/2 TO 1 TABLET BY  MOUTH TWO TIMES DAILY 90 tablet 5  . aluminum-magnesium hydroxide-simethicone (MAALOX) 831-517-61 MG/5ML SUSP Take 30 mLs by mouth 4 (four) times daily -  before meals and at bedtime. (Patient taking differently: Take 30 mLs by mouth 4 (four) times daily -  before meals and at bedtime. As needed) 355 mL 0  . aspirin 81 MG tablet Take 1 tablet by mouth daily.    . carbamazepine (TEGRETOL) 200 MG tablet TAKE 1 TABLET BY MOUTH TWICE DAILY 180 tablet 4  . clopidogrel (PLAVIX) 75 MG tablet TAKE 1 TABLET(75 MG) BY MOUTH DAILY 90 tablet 4  . diltiazem (DILT-XR) 180 MG 24 hr capsule TAKE 2 CAPSULES(360 MG) BY MOUTH DAILY 180 capsule 4  . esomeprazole (NEXIUM) 20 MG capsule Take 20 mg by mouth daily at 12 noon.    . famotidine (PEPCID) 20 MG tablet Take 1 tablet (20 mg total) by mouth 2 (two) times daily. (Patient not taking: Reported on 06/25/2019) 60 tablet 0  . hydrochlorothiazide (HYDRODIURIL) 12.5 MG tablet Take 1  tablet (12.5 mg total) by mouth daily. 90 tablet 4  . IRON PO Take 1 tablet by mouth daily.    . nortriptyline (PAMELOR) 10 MG capsule TAKE 1 TO 2 CAPSULES BY  MOUTH AT BEDTIME 180 capsule 4  . simvastatin (ZOCOR) 20 MG tablet Take 1 tablet (20 mg total) by mouth daily. 90 tablet 4   No current facility-administered medications for this visit.     Patient confirms/reports the following allergies:  Allergies  Allergen Reactions  . Augmentin [Amoxicillin-Pot Clavulanate] Hives  . Quinapril-Hydrochlorothiazide Other (See Comments)    Other reaction(s): Headache  . Tape     No orders of the defined types were placed in this encounter.   AUTHORIZATION INFORMATION Primary Insurance: 1D#: Group #:  Secondary Insurance: 1D#: Group #:  SCHEDULE INFORMATION: Date: 07/10/19 Time: Location:[facility]

## 2019-06-27 NOTE — Telephone Encounter (Signed)
Patient has been advised per Dr. Maralyn Sago Blood Thinner Request to stop Plavix on 07/05/19.  5 days prior to her colonoscopy.  Thanks [employer]

## 2019-06-29 ENCOUNTER — Other Ambulatory Visit: Payer: Self-pay

## 2019-06-29 ENCOUNTER — Emergency Department
Admission: EM | Admit: 2019-06-29 | Discharge: 2019-06-29 | Disposition: A | Discharge status: Home / Self Care | Payer: Medicare Other | Attending: Emergency Medicine | Admitting: Emergency Medicine

## 2019-06-29 ENCOUNTER — Emergency Department: Payer: Medicare Other

## 2019-06-29 DIAGNOSIS — Z8673 Personal history of transient ischemic attack (TIA), and cerebral infarction without residual deficits: Secondary | ICD-10-CM | POA: Insufficient documentation

## 2019-06-29 DIAGNOSIS — Z79899 Other long term (current) drug therapy: Secondary | ICD-10-CM | POA: Diagnosis not present

## 2019-06-29 DIAGNOSIS — R109 Unspecified abdominal pain: Secondary | ICD-10-CM | POA: Diagnosis present

## 2019-06-29 DIAGNOSIS — I10 Essential (primary) hypertension: Secondary | ICD-10-CM | POA: Insufficient documentation

## 2019-06-29 DIAGNOSIS — K5901 Slow transit constipation: Secondary | ICD-10-CM | POA: Insufficient documentation

## 2019-06-29 DIAGNOSIS — Z7901 Long term (current) use of anticoagulants: Secondary | ICD-10-CM | POA: Insufficient documentation

## 2019-06-29 DIAGNOSIS — K2900 Acute gastritis without bleeding: Secondary | ICD-10-CM | POA: Insufficient documentation

## 2019-06-29 DIAGNOSIS — Z7982 Long term (current) use of aspirin: Secondary | ICD-10-CM | POA: Diagnosis not present

## 2019-06-29 LAB — CBC
HCT: 37.8 % (ref 36.0–46.0)
Hemoglobin: 12.7 g/dL (ref 12.0–15.0)
MCH: 33.2 pg (ref 26.0–34.0)
MCHC: 33.6 g/dL (ref 30.0–36.0)
MCV: 98.7 fL (ref 80.0–100.0)
Platelets: 202 10*3/uL (ref 150–400)
RBC: 3.83 MIL/uL — ABNORMAL LOW (ref 3.87–5.11)
RDW: 11.9 % (ref 11.5–15.5)
WBC: 5.3 10*3/uL (ref 4.0–10.5)
nRBC: 0 % (ref 0.0–0.2)

## 2019-06-29 LAB — COMPREHENSIVE METABOLIC PANEL
ALT: 25 U/L (ref 0–44)
AST: 29 U/L (ref 15–41)
Albumin: 4.2 g/dL (ref 3.5–5.0)
Alkaline Phosphatase: 101 U/L (ref 38–126)
Anion gap: 9 (ref 5–15)
BUN: 22 mg/dL (ref 8–23)
CO2: 26 mmol/L (ref 22–32)
Calcium: 9.1 mg/dL (ref 8.9–10.3)
Chloride: 105 mmol/L (ref 98–111)
Creatinine, Ser: 1.05 mg/dL — ABNORMAL HIGH (ref 0.44–1.00)
GFR calc Af Amer: >60 mL/min (ref >60)
GFR calc non Af Amer: 53 mL/min — ABNORMAL LOW (ref >60)
Glucose, Bld: 89 mg/dL (ref 70–99)
Potassium: 3.7 mmol/L (ref 3.5–5.1)
Sodium: 140 mmol/L (ref 135–145)
Total Bilirubin: 0.7 mg/dL (ref 0.3–1.2)
Total Protein: 7.9 g/dL (ref 6.5–8.1)

## 2019-06-29 LAB — URINALYSIS, COMPLETE (UACMP) WITH MICROSCOPIC
Bilirubin Urine: NEGATIVE
Glucose, UA: NEGATIVE mg/dL
Hgb urine dipstick: NEGATIVE
Ketones, ur: NEGATIVE mg/dL
Nitrite: NEGATIVE
Protein, ur: NEGATIVE mg/dL
Specific Gravity, Urine: 1.023 (ref 1.005–1.030)
Squamous Epithelial / HPF: >50 — ABNORMAL HIGH (ref 0–5)
Waxy Casts, UA: 2
pH: 5 (ref 5.0–8.0)

## 2019-06-29 LAB — LIPASE, BLOOD: Lipase: 27 U/L (ref 11–51)

## 2019-06-29 MED ORDER — DOCUSATE SODIUM 100 MG PO CAPS: 100.0000 mg | ORAL_CAPSULE | Freq: Every day | ORAL | 0 refills | Status: DC | PRN

## 2019-06-29 MED ORDER — ONDANSETRON 4 MG PO TBDP: 4.0000 mg | ORAL_TABLET | Freq: Three times a day (TID) | ORAL | 0 refills | Status: DC | PRN

## 2019-06-29 MED ORDER — PANTOPRAZOLE SODIUM 40 MG PO TBEC: 40.0000 mg | DELAYED_RELEASE_TABLET | Freq: Every day | ORAL | 0 refills | Status: DC

## 2019-06-29 MED ORDER — ALUM & MAG HYDROXIDE-SIMETH 200-200-20 MG/5ML PO SUSP
30.0000 mL | Freq: Once | ORAL | Status: AC
  Administered 2019-06-29: 18:00:00 30 mL via ORAL
  Filled 2019-06-29: qty 30

## 2019-06-29 MED ORDER — IOHEXOL 300 MG/ML  SOLN
100.0000 mL | Freq: Once | INTRAMUSCULAR | Status: AC | PRN
  Administered 2019-06-29: 17:00:00 100 mL via INTRAVENOUS

## 2019-06-29 MED ORDER — DOCUSATE SODIUM 100 MG PO CAPS: 100.0000 mg | ORAL_CAPSULE | Freq: Every day | ORAL | 0 refills | Status: AC | PRN

## 2019-06-29 MED ORDER — LIDOCAINE VISCOUS HCL 2 % MT SOLN
15.0000 mL | Freq: Once | OROMUCOSAL | Status: AC
  Administered 2019-06-29: 18:00:00 15 mL via ORAL
  Filled 2019-06-29: qty 15

## 2019-06-29 MED ORDER — POLYETHYLENE GLYCOL 3350 17 G PO PACK: PACK | ORAL | 0 refills | Status: DC

## 2019-06-29 MED ORDER — SODIUM CHLORIDE 0.9% FLUSH: 3.0000 mL | Freq: Once | INTRAVENOUS | Status: DC

## 2019-06-29 MED ORDER — DICYCLOMINE HCL 10 MG PO CAPS
20.0000 mg | ORAL_CAPSULE | Freq: Once | ORAL | Status: AC
  Administered 2019-06-29: 18:00:00 20 mg via ORAL
  Filled 2019-06-29: qty 2

## 2019-06-29 NOTE — Discharge Instructions (Addendum)
Start taking the antacid (Protonix) tomorrow morning. Take first thing in the morning before eating.  For your constipation: - Start taking Colace or other over-the-counter stool softener daily - Start taking Miralax or its generic, polyethylene glycol, twice a day until stools are soft.  - take Zofran (nausea medication) before taking the miralax

## 2019-06-29 NOTE — ED Triage Notes (Signed)
Pt c/o intermittent LUQ and RUQ pain since Monday, states she had diarrhea on Monday when this started but has not had a BM since Wednesday., denies N/V.Marland Kitchen pt is in NAD at present.

## 2019-06-29 NOTE — ED Notes (Signed)
2 failed attempt for a lab draw from the right and left AC.

## 2019-06-29 NOTE — ED Provider Notes (Signed)
[facility] Emergency Department Provider Note  ____________________________________________   First MD Initiated Contact with Patient 06/29/19 1719     (approximate)  I have reviewed the triage vital signs and the nursing notes.   HISTORY  Chief Complaint Abdominal Pain    HPI Amy Ray is a 73 y.o. female  With h/o HTN, HLD, here with abdominal pain. Pt reports that for the last "week or so," she has had mild epigastric and general abd discomfort. She states it initially began after eating, and she describes it as a dull, aching, epigastric pain that resolves when not eating. She states she has also felt somewhat full with decreased appetite. She has not had any fever, chills, or other sx. No urinary sx. She has some mild nausea after she eats but no vomiting. No melena or hematochezia. She states she eats, then experiences the discomfort 30 min-1 hr later that then resolves. No other issues.         Past Medical History:  Diagnosis Date  . History of diverticulitis of colon 05/20/2015   per patient report   . Hyperlipidemia   . Hypertension   . Stroke (HCC)   . TIA (transient ischemic attack)   . Vertigo     Patient Active Problem List   Diagnosis Date Noted  . Low back pain 05/20/2015  . Fatigue 05/20/2015  . History of CVA (cerebrovascular accident) 05/20/2015  . Insomnia 05/20/2015  . Morbid obesity (HCC) 05/20/2015  . Positive H. pylori test 05/20/2015  . Tremor 05/20/2015  . Dysfunction of eustachian tube 05/20/2015  . Diverticulosis 05/20/2015  . Essential (primary) hypertension 07/17/2007  . Alterations of sensations, late effect of cerebrovascular disease 03/11/2005  . Hyperlipidemia 10/12/2003  . Trigeminal neuralgia 10/12/2003    Past Surgical History:  Procedure Laterality Date  . CHOLECYSTECTOMY  2000  . HERNIA REPAIR     umbilical hernia as a child  . REPLACEMENT TOTAL KNEE Bilateral     Prior to Admission  medications   Medication Sig Start Date End Date Taking? Authorizing Provider  ALPRAZolam [name](XANAX) 0.5 MG tablet TAKE 1/2 TO 1 TABLET BY  MOUTH TWO TIMES DAILY 04/26/19   Malva LimesFisher, Donald E, MD  aluminum-magnesium hydroxide-simethicone (MAALOX) 200-200-20 MG/5ML SUSP Take 30 mLs by mouth 4 (four) times daily -  before meals and at bedtime. Patient taking differently: Take 30 mLs by mouth 4 (four) times daily -  before meals and at bedtime. As needed 08/02/18   Sharman CheekStafford, Phillip, MD  aspirin 81 MG tablet Take 1 tablet by mouth daily.    [provider]  carbamazepine (TEGRETOL) 200 MG tablet TAKE 1 TABLET BY MOUTH TWICE DAILY 06/21/19   Malva LimesFisher, Donald E, MD  clopidogrel (PLAVIX) 75 MG tablet TAKE 1 TABLET(75 MG) BY MOUTH DAILY 11/13/18   Malva LimesFisher, Donald E, MD  diltiazem (DILT-XR) 180 MG 24 hr capsule TAKE 2 CAPSULES(360 MG) BY MOUTH DAILY 11/14/18   Malva LimesFisher, Donald E, MD  docusate sodium (COLACE) 100 MG capsule Take 1 capsule (100 mg total) by mouth daily as needed for up to 14 days. 06/29/19 07/13/19  Shaune PollackIsaacs, Andon Villard, MD  esomeprazole (NEXIUM) 20 MG capsule Take 20 mg by mouth daily at 12 noon.    [provider]  famotidine (PEPCID) 20 MG tablet Take 1 tablet (20 mg total) by mouth 2 (two) times daily. Patient not taking: Reported on 06/25/2019 08/02/18   Sharman CheekStafford, Phillip, MD  hydrochlorothiazide (HYDRODIURIL) 12.5 MG tablet Take 1 tablet (12.5  mg total) by mouth daily. 08/22/18   Malva LimesFisher, Donald E, MD  IRON PO Take 1 tablet by mouth daily.    [provider]  nortriptyline (PAMELOR) 10 MG capsule TAKE 1 TO 2 CAPSULES BY  MOUTH AT BEDTIME 02/26/19   Malva LimesFisher, Donald E, MD  ondansetron (ZOFRAN ODT) 4 MG disintegrating tablet Take 1 tablet (4 mg total) by mouth every 8 (eight) hours as needed for nausea or vomiting. 06/29/19   Shaune PollackIsaacs, Coraline Talwar, MD  pantoprazole (PROTONIX) 40 MG tablet Take 1 tablet (40 mg total) by mouth daily for 14 days. 06/29/19 07/13/19  Shaune PollackIsaacs, Rowe Warman, MD  polyethylene  glycol (MIRALAX / GLYCOLAX) 17 g packet Take one packet two times daily until your stools are soft/liquid, then stop. 06/29/19   Shaune PollackIsaacs, Averie Hornbaker, MD  simvastatin (ZOCOR) 20 MG tablet Take 1 tablet (20 mg total) by mouth daily. 09/14/18   Malva LimesFisher, Donald E, MD    Allergies Augmentin [amoxicillin-pot clavulanate], Quinapril-hydrochlorothiazide, and Tape  Family History  Problem Relation Age of Onset  . Heart attack Mother   . Hypertension Mother   . Alzheimer's disease Father   . Diabetes Father   . Hypertension Sister   . Diabetes Brother   . Hypertension Sister   . Hypertension Sister   . Hypertension Sister   . Hypertension Sister   . Breast cancer Sister [id number]  . Breast cancer Maternal Aunt [id number]    Social History Social History   Tobacco Use  . Smoking status: Never Smoker  . Smokeless tobacco: Never Used  Substance Use Topics  . Alcohol use: No    Alcohol/week: 0.0 standard drinks  . Drug use: No    Review of Systems  Review of Systems  Constitutional: Positive for fatigue. Negative for fever.  HENT: Negative for congestion and sore throat.   Eyes: Negative for visual disturbance.  Respiratory: Negative for cough and shortness of breath.   Cardiovascular: Negative for chest pain.  Gastrointestinal: Positive for abdominal pain and nausea. Negative for diarrhea and vomiting.  Genitourinary: Negative for flank pain.  Musculoskeletal: Negative for back pain and neck pain.  Skin: Negative for rash and wound.  Neurological: Negative for weakness.  All other systems reviewed and are negative.    ____________________________________________  PHYSICAL EXAM:      VITAL SIGNS: ED Triage Vitals  Enc Vitals Group     BP 06/29/19 1302 (!) 118/55     Pulse Rate 06/29/19 1302 83     Resp 06/29/19 1302 17     Temp 06/29/19 1302 98.6 F (37 C)     Temp Source 06/29/19 1302 Oral     SpO2 06/29/19 1302 95 %     Weight 06/29/19 1303 214 lb (97.1 kg)     Height 06/29/19 1303 5'  (1.524 m)     Head Circumference --      Peak Flow --      Pain Score 06/29/19 1303 0     Pain Loc --      Pain Edu? --      Excl. in GC? --      Physical Exam Vitals signs and nursing note reviewed.  Constitutional:      General: She is not in acute distress.    Appearance: She is well-developed.  HENT:     Head: Normocephalic and atraumatic.  Eyes:     Conjunctiva/sclera: Conjunctivae normal.  Neck:     Musculoskeletal: Neck supple.  Cardiovascular:     Rate and Rhythm:  Normal rate and regular rhythm.     Heart sounds: Normal heart sounds. No murmur. No friction rub.  Pulmonary:     Effort: Pulmonary effort is normal. No respiratory distress.     Breath sounds: Normal breath sounds. No wheezing or rales.  Abdominal:     General: There is no distension.     Palpations: Abdomen is soft.     Tenderness: There is abdominal tenderness (mild) in the epigastric area. There is no guarding or rebound.  Skin:    General: Skin is warm.     Capillary Refill: Capillary refill takes less than 2 seconds.  Neurological:     Mental Status: She is alert and oriented to person, place, and time.     Motor: No abnormal muscle tone.       ____________________________________________   LABS (all labs ordered are listed, but only abnormal results are displayed)  Labs Reviewed  COMPREHENSIVE METABOLIC PANEL - Abnormal; Notable for the following components:      Result Value   Creatinine, Ser 1.05 (*)    GFR calc non Af Amer 53 (*)    All other components within normal limits  CBC - Abnormal; Notable for the following components:   RBC 3.83 (*)    All other components within normal limits  URINALYSIS, COMPLETE (UACMP) WITH MICROSCOPIC - Abnormal; Notable for the following components:   Color, Urine YELLOW (*)    APPearance CLOUDY (*)    Leukocytes,Ua SMALL (*)    Bacteria, UA RARE (*)    Squamous Epithelial / LPF >50 (*)    All other components within normal limits  LIPASE,  BLOOD    ____________________________________________  EKG: Normal sinus rhythm, VR 85. Normal intervals. No acute st-t segment elevations or depressions. No significant change from prior. ________________________________________  RADIOLOGY All imaging, including plain films, CT scans, and ultrasounds, independently reviewed by me, and interpretations confirmed via formal radiology reads.  ED MD interpretation:   CT A/P: large stool burden, otherwise no acute abnormality  Official radiology report(s): Ct Abdomen Pelvis Wo Contrast  Addendum Date: 06/29/2019   ADDENDUM REPORT: 06/29/2019 18:40 ADDENDUM: Not mentioned above: Small fat containing left inguinal hernia. Electronically Signed   By: Kathreen Devoid   On: 06/29/2019 18:40   Result Date: 06/29/2019 CLINICAL DATA:  Left upper quadrant pain, right upper quadrant pain since Monday EXAM: CT ABDOMEN AND PELVIS WITHOUT CONTRAST TECHNIQUE: Multidetector CT imaging of the abdomen and pelvis was performed following the standard protocol without IV contrast. COMPARISON:  08/28/2017 FINDINGS: Lower chest: No acute abnormality. Hepatobiliary: No focal liver abnormality is seen. Status post cholecystectomy. No biliary dilatation. Pancreas: Unremarkable. No pancreatic ductal dilatation or surrounding inflammatory changes. Spleen: Normal in size without focal abnormality. Adrenals/Urinary Tract: Adrenal glands are unremarkable. Kidneys are normal, without renal calculi, focal lesion, or hydronephrosis. Bladder is unremarkable. Stomach/Bowel: Stomach is within normal limits. Appendix appears normal. No evidence of bowel wall thickening, distention, or inflammatory changes. Moderate amount of stool throughout the colon. Vascular/Lymphatic: Normal caliber abdominal aorta with mild atherosclerosis. No lymphadenopathy. Reproductive: Uterus and bilateral adnexa are unremarkable. Intrauterine device is in stable position. Other: No abdominal wall hernia or  abnormality. No abdominopelvic ascites. Musculoskeletal: No acute osseous abnormality. No aggressive osseous lesion. Degenerative disease with disc height loss at L4-5 and L5-S1. Grade 1 anterolisthesis of L4 on L5 secondary to bilateral facet disease. IMPRESSION: 1. No acute abdominal or pelvic pathology. 2. Moderate amount of stool throughout the colon. 3.  Aortic  Atherosclerosis (ICD10-I70.0). Electronically Signed: By: Elige Ko On: 06/29/2019 18:34    ____________________________________________  PROCEDURES   Procedure(s) performed (including Critical Care):  Procedures  ____________________________________________  INITIAL IMPRESSION / MDM / ASSESSMENT AND PLAN / ED COURSE  As part of my medical decision making, I reviewed the following data within the electronic MEDICAL RECORD NUMBER Notes from prior ED visits and [place] Controlled Substance Database      *Kashana Molenaar was evaluated in Emergency Department on 06/29/2019 for the symptoms described in the history of present illness. She was evaluated in the context of the global COVID-19 pandemic, which necessitated consideration that the patient might be at risk for infection with the SARS-CoV-2 virus that causes COVID-19. Institutional protocols and algorithms that pertain to the evaluation of patients at risk for COVID-19 are in a state of rapid change based on information released by regulatory bodies including the CDC and federal and state organizations. These policies and algorithms were followed during the patient's care in the ED.  Some ED evaluations and interventions may be delayed as a result of limited staffing during the pandemic.*      Medical Decision Making: 73 yo F here with mild epigastric discomfort after eating, with early satiety. Pt non-toxic and well appearing on exam. CBC, CMP, Lipase all unremarkable. No urinary sx. EKG is non-ischemic. CT scan obtained and shows moderate constipation no obstruction or other acute  abnormality. Sx are consistent with possible mild gastritis/early PUD w/o bleeding, versus symptomatic constipation. She is s/p cholecystectomy. Will treat with supportive care, antacids, and outpt follow-up. No signs of ACS or referred anginal pain.   ____________________________________________  FINAL CLINICAL IMPRESSION(S) / ED DIAGNOSES  Final diagnoses:  Acute superficial gastritis without hemorrhage  Slow transit constipation     MEDICATIONS GIVEN DURING THIS VISIT:  Medications  sodium chloride flush (NS) 0.9 % injection 3 mL (has no administration in time range)  iohexol (OMNIPAQUE) 300 MG/ML solution 100 mL (100 mLs Intravenous Contrast Given 06/29/19 1726)  alum & mag hydroxide-simeth (MAALOX/MYLANTA) 200-200-20 MG/5ML suspension 30 mL (30 mLs Oral Given 06/29/19 1809)    And  lidocaine (XYLOCAINE) 2 % viscous mouth solution 15 mL (15 mLs Oral Given 06/29/19 1809)  dicyclomine (BENTYL) capsule 20 mg (20 mg Oral Given 06/29/19 1809)     ED Discharge Orders         Ordered    pantoprazole (PROTONIX) 40 MG tablet  Daily,   Status:  Discontinued     06/29/19 1942    ondansetron (ZOFRAN ODT) 4 MG disintegrating tablet  Every 8 hours PRN,   Status:  Discontinued     06/29/19 1942    polyethylene glycol (MIRALAX / GLYCOLAX) 17 g packet  Status:  Discontinued     06/29/19 1942    docusate sodium (COLACE) 100 MG capsule  Daily PRN,   Status:  Discontinued     06/29/19 1942    docusate sodium (COLACE) 100 MG capsule  Daily PRN     06/29/19 2010    ondansetron (ZOFRAN ODT) 4 MG disintegrating tablet  Every 8 hours PRN     06/29/19 2010    pantoprazole (PROTONIX) 40 MG tablet  Daily     06/29/19 2010    polyethylene glycol (MIRALAX / GLYCOLAX) 17 g packet     06/29/19 2010           Note:  This document was prepared using Dragon voice recognition software and may include unintentional dictation errors.  Shaune Pollack, MD 06/29/19 2241

## 2019-06-29 NOTE — ED Notes (Signed)
Patient transported to CT 

## 2019-07-06 ENCOUNTER — Other Ambulatory Visit: Admission: RE | Admit: 2019-07-06 | Payer: Medicare Other | Source: Ambulatory Visit

## 2019-07-09 ENCOUNTER — Telehealth: Payer: Self-pay

## 2019-07-09 NOTE — Telephone Encounter (Signed)
LVM for pt to call office to reschedule her tomorrows colonoscopy due to not having her COVID test.  Thanks Sharyn Lull

## 2019-07-10 ENCOUNTER — Encounter: Admission: RE | Payer: Self-pay | Source: Home / Self Care

## 2019-07-10 ENCOUNTER — Ambulatory Visit: Admission: RE | Admit: 2019-07-10 | Payer: Medicare Other | Source: Home / Self Care | Admitting: Gastroenterology

## 2019-07-10 SURGERY — COLONOSCOPY WITH PROPOFOL
Anesthesia: General

## 2019-07-16 ENCOUNTER — Other Ambulatory Visit: Payer: Medicare Other

## 2019-07-30 ENCOUNTER — Encounter: Payer: Self-pay | Admitting: Family Medicine

## 2019-07-30 ENCOUNTER — Ambulatory Visit: Payer: Medicare Other | Admitting: Family Medicine

## 2019-07-30 ENCOUNTER — Other Ambulatory Visit: Payer: Self-pay

## 2019-07-30 DIAGNOSIS — E2839 Other primary ovarian failure: Secondary | ICD-10-CM

## 2019-07-30 DIAGNOSIS — E785 Hyperlipidemia, unspecified: Secondary | ICD-10-CM

## 2019-07-30 DIAGNOSIS — I10 Essential (primary) hypertension: Secondary | ICD-10-CM

## 2019-07-30 DIAGNOSIS — Z8673 Personal history of transient ischemic attack (TIA), and cerebral infarction without residual deficits: Secondary | ICD-10-CM

## 2019-07-30 DIAGNOSIS — Z1211 Encounter for screening for malignant neoplasm of colon: Secondary | ICD-10-CM

## 2019-07-30 NOTE — Patient Instructions (Signed)
.   Please review the attached list of medications and notify my office if there are any errors.   . Please bring all of your medications to every appointment so we can make sure that our medication list is the same as yours.   . It is especially important to get the annual flu vaccine this year. If you haven't had it already, please go to your pharmacy or call the office as soon as possible to schedule you flu shot.  

## 2019-07-30 NOTE — Progress Notes (Signed)
Patient: Amy Ray Female    DOB: March 30, 1946   73 y.o.   MRN: [id number] Visit Date: 07/30/2019  Today's Provider: Mila Merry, MD   Chief Complaint  Patient presents with  . Hypertension   Subjective:     HPI  Hypertension, follow-up:  BP Readings from Last 3 Encounters:  07/30/19 120/72  06/29/19 (!) 149/85  06/03/19 (!) 142/75    She was last seen for hypertension 4 months ago.  BP at that visit was 132/82. Management since that visit includes no changes. She reports good compliance with treatment. She is not having side effects.  She is not exercising. She is adherent to low salt diet.   Outside blood pressures are 130/51. She is experiencing lower extremity edema.  Patient denies chest pain, chest pressure/discomfort, claudication, dyspnea, exertional chest pressure/discomfort, fatigue, irregular heart beat, near-syncope, orthopnea, palpitations, paroxysmal nocturnal dyspnea, syncope and tachypnea.   Cardiovascular risk factors include advanced age (older than 18 for men, 43 for women), dyslipidemia and hypertension.  Use of agents associated with hypertension: NSAIDS.     Weight trend: fluctuating a bit Wt Readings from Last 3 Encounters:  07/30/19 224 lb (101.6 kg)  06/29/19 214 lb (97.1 kg)  06/03/19 214 lb (97.1 kg)    Current diet: well balanced  ------------------------------------------------------------------------  Lipid/Cholesterol, Follow-up:   Last seen for this 4 months ago.  Management changes since that visit include none. . Last Lipid Panel:    Component Value Date/Time   CHOL 174 06/23/2018 1029   TRIG 68 06/23/2018 1029   HDL 99 06/23/2018 1029   CHOLHDL 1.8 06/23/2018 1029   LDLCALC 61 06/23/2018 1029    Risk factors for vascular disease include hypercholesterolemia and hypertension  She reports good compliance with treatment. She is not having side effects.  Current symptoms include none and have been stable.  Weight trend: fluctuating a bit Prior visit with dietician: no Current diet: well balanced Current exercise: none  Wt Readings from Last 3 Encounters:  07/30/19 224 lb (101.6 kg)  06/29/19 214 lb (97.1 kg)  06/03/19 214 lb (97.1 kg)    -------------------------------------------------------------------  Allergies  Allergen Reactions  . Augmentin [Amoxicillin-Pot Clavulanate] Hives  . Quinapril-Hydrochlorothiazide Other (See Comments)    Other reaction(s): Headache  . Tape      Current Outpatient Medications:  .  ALPRAZolam (XANAX) 0.5 MG tablet, TAKE 1/2 TO 1 TABLET BY  MOUTH TWO TIMES DAILY, Disp: 90 tablet, Rfl: 5 .  aspirin 81 MG tablet, Take 1 tablet by mouth daily., Disp: , Rfl:  .  carbamazepine (TEGRETOL) 200 MG tablet, TAKE 1 TABLET BY MOUTH TWICE DAILY, Disp: 180 tablet, Rfl: 4 .  clopidogrel (PLAVIX) 75 MG tablet, TAKE 1 TABLET(75 MG) BY MOUTH DAILY, Disp: 90 tablet, Rfl: 4 .  diltiazem (DILT-XR) 180 MG 24 hr capsule, TAKE 2 CAPSULES(360 MG) BY MOUTH DAILY, Disp: 180 capsule, Rfl: 4 .  docusate sodium (COLACE) 100 MG capsule, Take 100 mg by mouth 2 (two) times daily as needed for mild constipation., Disp: , Rfl:  .  hydrochlorothiazide (HYDRODIURIL) 12.5 MG tablet, Take 1 tablet (12.5 mg total) by mouth daily., Disp: 90 tablet, Rfl: 4 .  IRON PO, Take 1 tablet by mouth daily., Disp: , Rfl:  .  nortriptyline (PAMELOR) 10 MG capsule, TAKE 1 TO 2 CAPSULES BY  MOUTH AT BEDTIME, Disp: 180 capsule, Rfl: 4 .  psyllium (METAMUCIL) 58.6 % packet, Take 1 packet by mouth daily., Disp: ,  Rfl:  .  simvastatin (ZOCOR) 20 MG tablet, Take 1 tablet (20 mg total) by mouth daily., Disp: 90 tablet, Rfl: 4  Review of Systems  Constitutional: Negative for appetite change, chills, fatigue and fever.  Respiratory: Negative for chest tightness and shortness of breath.   Cardiovascular: Negative for chest pain and palpitations.  Gastrointestinal: Negative for abdominal pain, nausea and  vomiting.  Neurological: Negative for dizziness and weakness.    Social History   Tobacco Use  . Smoking status: Never Smoker  . Smokeless tobacco: Never Used  Substance Use Topics  . Alcohol use: No    Alcohol/week: 0.0 standard drinks      Objective:   BP 120/72 (BP Location: Left Arm, Patient Position: Sitting, Cuff Size: Large)   Pulse 90   Temp 98.2 F (36.8 C) (Temporal)   Resp 16   Wt 224 lb (101.6 kg)   SpO2 99% Comment: room air  BMI 43.75 kg/m  Vitals:   07/30/19 0952  BP: 120/72  Pulse: 90  Resp: 16  Temp: 98.2 F (36.8 C)  TempSrc: Temporal  SpO2: 99%  Weight: 224 lb (101.6 kg)  Body mass index is 43.75 kg/m.   Physical Exam   General Appearance:    Obese female in no acute distress  Eyes:    PERRL, conjunctiva/corneas clear, EOM's intact       Lungs:     Clear to auscultation bilaterally, respirations unlabored  Heart:    Normal heart rate. Normal rhythm. No murmurs, rubs, or gallops.   MS:   All extremities are intact.   Neurologic:   Awake, alert, oriented x 3. No apparent focal neurological           defect.            Assessment & Plan    1. Morbid obesity ([place]) Counseled regarding prudent diet and regular exercise.    2. History of CVA (cerebrovascular accident) Asymptomatic. Compliant with medication.  Continue aggressive risk factor modification.    3. Essential (primary) hypertension Well controlled.  Continue current medications.    4. Hyperlipidemia, unspecified hyperlipidemia type She is tolerating simvastatin well with no adverse effects.   - Comprehensive metabolic panel - Lipid panel  5. Colon cancer screening  - Cologuard  6. Estrogen deficiency Is being scheduled for BMD  Other orders - docusate sodium (COLACE) 100 MG capsule; Take 100 mg by mouth 2 (two) times daily as needed for mild constipation. - psyllium (METAMUCIL) 58.6 % packet; Take 1 packet by mouth daily.     Lelon Huh, MD  [facility]

## 2019-07-31 LAB — LIPID PANEL
Chol/HDL Ratio: 1.8 ratio (ref 0.0–4.4)
Cholesterol, Total: 173 mg/dL (ref 100–199)
HDL: 94 mg/dL (ref >39)
LDL Chol Calc (NIH): 65 mg/dL (ref 0–99)
Triglycerides: 72 mg/dL (ref 0–149)
VLDL Cholesterol Cal: 14 mg/dL (ref 5–40)

## 2019-07-31 LAB — COMPREHENSIVE METABOLIC PANEL
ALT: 20 IU/L (ref 0–32)
AST: 27 IU/L (ref 0–40)
Albumin/Globulin Ratio: 1.4 (ref 1.2–2.2)
Albumin: 4.3 g/dL (ref 3.7–4.7)
Alkaline Phosphatase: 131 IU/L — ABNORMAL HIGH (ref 39–117)
BUN/Creatinine Ratio: 15 (ref 12–28)
BUN: 17 mg/dL (ref 8–27)
Bilirubin Total: 0.3 mg/dL (ref 0.0–1.2)
CO2: 23 mmol/L (ref 20–29)
Calcium: 9.4 mg/dL (ref 8.7–10.3)
Chloride: 107 mmol/L — ABNORMAL HIGH (ref 96–106)
Creatinine, Ser: 1.12 mg/dL — ABNORMAL HIGH (ref 0.57–1.00)
GFR calc Af Amer: 57 mL/min/{1.73_m2} — ABNORMAL LOW (ref >59)
GFR calc non Af Amer: 49 mL/min/{1.73_m2} — ABNORMAL LOW (ref >59)
Globulin, Total: 3 g/dL (ref 1.5–4.5)
Glucose: 88 mg/dL (ref 65–99)
Potassium: 4.2 mmol/L (ref 3.5–5.2)
Sodium: 145 mmol/L — ABNORMAL HIGH (ref 134–144)
Total Protein: 7.3 g/dL (ref 6.0–8.5)

## 2019-08-07 LAB — HM DEXA SCAN: HM Dexa Scan: NORMAL

## 2019-08-29 ENCOUNTER — Other Ambulatory Visit: Payer: Self-pay | Admitting: Family Medicine

## 2019-08-29 DIAGNOSIS — I10 Essential (primary) hypertension: Secondary | ICD-10-CM

## 2019-09-04 LAB — COLOGUARD: Cologuard: NEGATIVE

## 2019-09-10 ENCOUNTER — Other Ambulatory Visit: Payer: Self-pay | Admitting: Family Medicine

## 2019-11-07 ENCOUNTER — Emergency Department: Payer: Medicare Other

## 2019-11-07 ENCOUNTER — Other Ambulatory Visit: Payer: Self-pay

## 2019-11-07 ENCOUNTER — Encounter: Payer: Self-pay | Admitting: Emergency Medicine

## 2019-11-07 ENCOUNTER — Emergency Department
Admission: EM | Admit: 2019-11-07 | Discharge: 2019-11-07 | Disposition: A | Discharge status: Home / Self Care | Payer: Medicare Other | Attending: Emergency Medicine | Admitting: Emergency Medicine

## 2019-11-07 DIAGNOSIS — I10 Essential (primary) hypertension: Secondary | ICD-10-CM | POA: Insufficient documentation

## 2019-11-07 DIAGNOSIS — Z7982 Long term (current) use of aspirin: Secondary | ICD-10-CM | POA: Diagnosis not present

## 2019-11-07 DIAGNOSIS — Z8673 Personal history of transient ischemic attack (TIA), and cerebral infarction without residual deficits: Secondary | ICD-10-CM | POA: Diagnosis not present

## 2019-11-07 DIAGNOSIS — Z79899 Other long term (current) drug therapy: Secondary | ICD-10-CM | POA: Insufficient documentation

## 2019-11-07 DIAGNOSIS — Z7901 Long term (current) use of anticoagulants: Secondary | ICD-10-CM | POA: Insufficient documentation

## 2019-11-07 DIAGNOSIS — R079 Chest pain, unspecified: Secondary | ICD-10-CM | POA: Diagnosis not present

## 2019-11-07 DIAGNOSIS — R0789 Other chest pain: Secondary | ICD-10-CM | POA: Diagnosis not present

## 2019-11-07 DIAGNOSIS — K219 Gastro-esophageal reflux disease without esophagitis: Secondary | ICD-10-CM | POA: Diagnosis not present

## 2019-11-07 LAB — CBC WITH DIFFERENTIAL/PLATELET
Abs Immature Granulocytes: 0.01 10*3/uL (ref 0.00–0.07)
Basophils Absolute: 0 10*3/uL (ref 0.0–0.1)
Basophils Relative: 1 %
Eosinophils Absolute: 0.1 10*3/uL (ref 0.0–0.5)
Eosinophils Relative: 2 %
HCT: 35.7 % — ABNORMAL LOW (ref 36.0–46.0)
Hemoglobin: 12.3 g/dL (ref 12.0–15.0)
Immature Granulocytes: 0 %
Lymphocytes Relative: 36 %
Lymphs Abs: 2.1 10*3/uL (ref 0.7–4.0)
MCH: 33.5 pg (ref 26.0–34.0)
MCHC: 34.5 g/dL (ref 30.0–36.0)
MCV: 97.3 fL (ref 80.0–100.0)
Monocytes Absolute: 0.7 10*3/uL (ref 0.1–1.0)
Monocytes Relative: 12 %
Neutro Abs: 2.9 10*3/uL (ref 1.7–7.7)
Neutrophils Relative %: 49 %
Platelets: 194 10*3/uL (ref 150–400)
RBC: 3.67 MIL/uL — ABNORMAL LOW (ref 3.87–5.11)
RDW: 11.5 % (ref 11.5–15.5)
WBC: 5.8 10*3/uL (ref 4.0–10.5)
nRBC: 0 % (ref 0.0–0.2)

## 2019-11-07 LAB — COMPREHENSIVE METABOLIC PANEL
ALT: 21 U/L (ref 0–44)
AST: 32 U/L (ref 15–41)
Albumin: 4 g/dL (ref 3.5–5.0)
Alkaline Phosphatase: 82 U/L (ref 38–126)
Anion gap: 6 (ref 5–15)
BUN: 20 mg/dL (ref 8–23)
CO2: 32 mmol/L (ref 22–32)
Calcium: 8.9 mg/dL (ref 8.9–10.3)
Chloride: 102 mmol/L (ref 98–111)
Creatinine, Ser: 1.24 mg/dL — ABNORMAL HIGH (ref 0.44–1.00)
GFR calc Af Amer: 50 mL/min — ABNORMAL LOW (ref >60)
GFR calc non Af Amer: 43 mL/min — ABNORMAL LOW (ref >60)
Glucose, Bld: 107 mg/dL — ABNORMAL HIGH (ref 70–99)
Potassium: 3.6 mmol/L (ref 3.5–5.1)
Sodium: 140 mmol/L (ref 135–145)
Total Bilirubin: 0.6 mg/dL (ref 0.3–1.2)
Total Protein: 7.4 g/dL (ref 6.5–8.1)

## 2019-11-07 LAB — TROPONIN I (HIGH SENSITIVITY)
Troponin I (High Sensitivity): 5 ng/L (ref <18)
Troponin I (High Sensitivity): 5 ng/L (ref <18)

## 2019-11-07 LAB — LIPASE, BLOOD: Lipase: 24 U/L (ref 11–51)

## 2019-11-07 MED ORDER — ALUM & MAG HYDROXIDE-SIMETH 200-200-20 MG/5ML PO SUSP
30.0000 mL | Freq: Once | ORAL | Status: AC
  Administered 2019-11-07: 30 mL via ORAL
  Filled 2019-11-07: qty 30

## 2019-11-07 MED ORDER — SODIUM CHLORIDE 0.9 % IV BOLUS
1000.0000 mL | Freq: Once | INTRAVENOUS | Status: AC
  Administered 2019-11-07: 1000 mL via INTRAVENOUS

## 2019-11-07 MED ORDER — FAMOTIDINE 40 MG PO TABS: 40.0000 mg | ORAL_TABLET | Freq: Every day | ORAL | 0 refills | Status: DC

## 2019-11-07 MED ORDER — FAMOTIDINE IN NACL 20-0.9 MG/50ML-% IV SOLN: 20.0000 mg | Freq: Once | INTRAVENOUS | Status: DC

## 2019-11-07 MED ORDER — ALUM & MAG HYDROXIDE-SIMETH 400-400-40 MG/5ML PO SUSP: 5.0000 mL | Freq: Four times a day (QID) | ORAL | 0 refills | Status: DC | PRN

## 2019-11-07 MED ORDER — FAMOTIDINE IN NACL 20-0.9 MG/50ML-% IV SOLN
20.0000 mg | Freq: Once | INTRAVENOUS | Status: AC
  Administered 2019-11-07: 20 mg via INTRAVENOUS
  Filled 2019-11-07: qty 50

## 2019-11-07 MED ORDER — ASPIRIN 81 MG PO CHEW
324.0000 mg | CHEWABLE_TABLET | Freq: Once | ORAL | Status: AC
  Administered 2019-11-07: 324 mg via ORAL
  Filled 2019-11-07: qty 4

## 2019-11-07 NOTE — ED Provider Notes (Signed)
[facility] Emergency Department Provider Note  ____________________________________________  Time seen: Approximately 4:22 AM  I have reviewed the triage vital signs and the nursing notes.   HISTORY  Chief Complaint Chest Pain   HPI Amy Ray is a 74 y.o. female with a history of hypertension, hyperlipidemia, CVA on Plavix who presents for evaluation of chest pain.  Patient reports the pain started yesterday evening but he felt better so she was able to sleep.  She woke up in the middle of the night and the pain had returned.  She describes the pain as a burning sensation in the center of her chest that is currently mild but was severe before her arrival.  No dizziness, no nausea, no vomiting, no shortness of breath.  Patient denies personal history of heart disease.  She reports that her mother has had a heart attack and her daughter has congestive heart failure.  She denies any cough, fever, body aches, vomiting or diarrhea.  She denies any known exposures to Covid.  She does endorse a history of GERD/indigestion but reports never this severe.  She denies history of alcohol use or smoking.  No abdominal pain.  The pain does not go to her back.   Past Medical History:  Diagnosis Date  . History of diverticulitis of colon 05/20/2015   per patient report   . Hyperlipidemia   . Hypertension   . Stroke (HCC)   . TIA (transient ischemic attack)   . Vertigo     Patient Active Problem List   Diagnosis Date Noted  . Low back pain 05/20/2015  . Fatigue 05/20/2015  . History of CVA (cerebrovascular accident) 05/20/2015  . Insomnia 05/20/2015  . Morbid obesity (HCC) 05/20/2015  . Positive H. pylori test 05/20/2015  . Tremor 05/20/2015  . Dysfunction of eustachian tube 05/20/2015  . Diverticulosis 05/20/2015  . Essential (primary) hypertension 07/17/2007  . Alterations of sensations, late effect of cerebrovascular disease 03/11/2005  . Hyperlipidemia  10/12/2003  . Trigeminal neuralgia 10/12/2003    Past Surgical History:  Procedure Laterality Date  . CHOLECYSTECTOMY  2000  . HERNIA REPAIR     umbilical hernia as a child  . REPLACEMENT TOTAL KNEE Bilateral     Prior to Admission medications   Medication Sig Start Date End Date Taking? Authorizing Provider  Acetaminophen (TYLENOL EX ST ARTHRITIS PAIN PO) Take 2 tablets by mouth daily as needed (for arthritis pain).   Yes [provider]  ALPRAZolam [name]) 0.5 MG tablet TAKE 1/2 TO 1 TABLET BY  MOUTH TWO TIMES DAILY 04/26/19  Yes Malva Limes, MD  aspirin 81 MG tablet Take 1 tablet by mouth daily.   Yes [provider]  carbamazepine (TEGRETOL) 200 MG tablet TAKE 1 TABLET BY MOUTH TWICE DAILY 06/21/19  Yes Malva Limes, MD  clopidogrel (PLAVIX) 75 MG tablet TAKE 1 TABLET BY MOUTH  DAILY 09/10/19  Yes Malva Limes, MD  cyanocobalamin 1000 MCG tablet Take 1,000 mcg by mouth daily.   Yes [provider]  diltiazem (DILT-XR) 180 MG 24 hr capsule TAKE 2 CAPSULES(360 MG) BY MOUTH DAILY 11/14/18  Yes Malva Limes, MD  docusate sodium (COLACE) 100 MG capsule Take 100 mg by mouth 2 (two) times daily as needed for mild constipation.   Yes [provider]  hydrochlorothiazide (HYDRODIURIL) 12.5 MG tablet TAKE 1 TABLET BY MOUTH  DAILY 08/29/19  Yes Malva Limes, MD  nortriptyline (PAMELOR) 10 MG capsule TAKE 1  TO 2 CAPSULES BY  MOUTH AT BEDTIME 02/26/19  Yes Malva Limes, MD  psyllium (METAMUCIL) 58.6 % packet Take 1 packet by mouth daily.   Yes [provider]  simvastatin (ZOCOR) 20 MG tablet Take 1 tablet (20 mg total) by mouth daily. 09/14/18  Yes Malva Limes, MD  alum & mag hydroxide-simeth (MAALOX MAX) 400-400-40 MG/5ML suspension Take 5 mLs by mouth every 6 (six) hours as needed for indigestion. 11/07/19   Nita Sickle, MD  famotidine (PEPCID) 40 MG tablet Take 1 tablet (40 mg total) by mouth at bedtime. 11/07/19 11/06/20   Nita Sickle, MD  IRON PO Take 1 tablet by mouth daily.    [provider]    Allergies Augmentin [amoxicillin-pot clavulanate] and Quinapril-hydrochlorothiazide  Family History  Problem Relation Age of Onset  . Heart attack Mother   . Hypertension Mother   . Alzheimer's disease Father   . Diabetes Father   . Hypertension Sister   . Diabetes Brother   . Hypertension Sister   . Hypertension Sister   . Hypertension Sister   . Hypertension Sister   . Breast cancer Sister 74  . Breast cancer Maternal Aunt 16    Social History Social History   Tobacco Use  . Smoking status: Never Smoker  . Smokeless tobacco: Never Used  Substance Use Topics  . Alcohol use: No    Alcohol/week: 0.0 standard drinks  . Drug use: No    Review of Systems  Constitutional: Negative for fever. Eyes: Negative for visual changes. ENT: Negative for sore throat. Neck: No neck pain  Cardiovascular: + chest pain. Respiratory: Negative for shortness of breath. Gastrointestinal: Negative for abdominal pain, vomiting or diarrhea. Genitourinary: Negative for dysuria. Musculoskeletal: Negative for back pain. Skin: Negative for rash. Neurological: Negative for headaches, weakness or numbness. Psych: No SI or HI  ____________________________________________   PHYSICAL EXAM:  VITAL SIGNS: ED Triage Vitals  Enc Vitals Group     BP 11/07/19 0410 99/82     Pulse Rate 11/07/19 0410 91     Resp 11/07/19 0410 18     Temp 11/07/19 0410 98.3 F (36.8 C)     Temp Source 11/07/19 0410 Oral     SpO2 11/07/19 0410 100 %     Weight 11/07/19 0402 215 lb (97.5 kg)     Height 11/07/19 0402 5' (1.524 m)     Head Circumference --      Peak Flow --      Pain Score 11/07/19 0402 8     Pain Loc --      Pain Edu? --      Excl. in GC? --     Constitutional: Alert and oriented. Well appearing and in no apparent distress. HEENT:      Head: Normocephalic and atraumatic.         Eyes:  Conjunctivae are normal. Sclera is non-icteric.       Mouth/Throat: Mucous membranes are moist.       Neck: Supple with no signs of meningismus. Cardiovascular: Regular rate and rhythm. No murmurs, gallops, or rubs. 2+ symmetrical distal pulses are present in all extremities. No JVD. Respiratory: Normal respiratory effort. Lungs are clear to auscultation bilaterally. No wheezes, crackles, or rhonchi.  Gastrointestinal: Soft, non tender, and non distended with positive bowel sounds. No rebound or guarding. Musculoskeletal: Nontender with normal range of motion in all extremities. No edema, cyanosis, or erythema of extremities. Neurologic: Normal speech and language. Face is symmetric.  Moving all extremities. No gross focal neurologic deficits are appreciated. Skin: Skin is warm, dry and intact. No rash noted. Psychiatric: Mood and affect are normal. Speech and behavior are normal.  ____________________________________________   LABS (all labs ordered are listed, but only abnormal results are displayed)  Labs Reviewed  CBC WITH DIFFERENTIAL/PLATELET - Abnormal; Notable for the following components:      Result Value   RBC 3.67 (*)    HCT 35.7 (*)    All other components within normal limits  COMPREHENSIVE METABOLIC PANEL - Abnormal; Notable for the following components:   Glucose, Bld 107 (*)    Creatinine, Ser 1.24 (*)    GFR calc non Af Amer 43 (*)    GFR calc Af Amer 50 (*)    All other components within normal limits  LIPASE, BLOOD  TROPONIN I (HIGH SENSITIVITY)  TROPONIN I (HIGH SENSITIVITY)   ____________________________________________  EKG  ED ECG REPORT I, Nita Sickle, the attending physician, personally viewed and interpreted this ECG.  Normal sinus rhythm, rate of 88, normal intervals, normal axis, no ST elevations or depressions.  Normal EKG. ____________________________________________  RADIOLOGY  I have personally reviewed the images performed during this  visit and I agree with the Radiologist's read. _____________________________   PROCEDURES  Procedure(s) performed: None Procedures Critical Care performed:  None ____________________________________________   INITIAL IMPRESSION / ASSESSMENT AND PLAN / ED COURSE  74 y.o. female with a history of hypertension, hyperlipidemia, CVA on Plavix who presents for evaluation of intermittent central burning chest pain.  Patient is extremely well-appearing in no distress, slightly hypotensive with BP of 99/82 but no tachycardia and afebrile.  Exam is otherwise unremarkable with no abdominal tenderness, heart regular rate and rhythm, lungs are clear, extremities are warm and well perfused with normal pulses.  Differential diagnosis include indigestion versus GERD versus peptic ulcer disease versus ACS versus pancreatitis versus dissection although less likely with no significant elevated BP, pain not radiating to the back, and normal neurovascular exam.   Will give ASA, maalox, IV pepcid. Will get labs, CXR, and serial HS-troponin x2. Will monitor closely on telemetry.   Clinical Course as of Nov 12 2315  Wed Nov 07, 2019  0700 Pain resolved with maalox and pepcid. 2nd troponin pending. Plan to dc home if negative with f/u with PCP. Will provide patient with prescription for maalox and pepcid. Discussed my standard return precautions and close f/u with PCP   [CV]    Clinical Course User Index [CV] Don Perking [place], MD      As part of my medical decision making, I reviewed the following data within the electronic MEDICAL RECORD NUMBER Nursing notes reviewed and incorporated, Labs reviewed , EKG interpreted , Old chart reviewed, Radiograph reviewed , Notes from prior ED visits and [place] Controlled Substance Database   Please note:  Patient was evaluated in Emergency Department today for the symptoms described in the history of present illness. Patient was evaluated in the context of the global COVID-19  pandemic, which necessitated consideration that the patient might be at risk for infection with the SARS-CoV-2 virus that causes COVID-19. Institutional protocols and algorithms that pertain to the evaluation of patients at risk for COVID-19 are in a state of rapid change based on information released by regulatory bodies including the CDC and federal and state organizations. These policies and algorithms were followed during the patient's care in the ED.  Some ED evaluations and interventions may be delayed as a result of limited  staffing during the pandemic.   ____________________________________________   FINAL CLINICAL IMPRESSION(S) / ED DIAGNOSES   Final diagnoses:  Chest pain, unspecified type  Gastroesophageal reflux disease, unspecified whether esophagitis present      NEW MEDICATIONS STARTED DURING THIS VISIT:  ED Discharge Orders         Ordered    alum & mag hydroxide-simeth (MAALOX MAX) 625-638-93 MG/5ML suspension  Every 6 hours PRN     11/07/19 0606    famotidine (PEPCID) 40 MG tablet  Daily at bedtime     11/07/19 0606           Note:  This document was prepared using Dragon voice recognition software and may include unintentional dictation errors.    Alfred Levins, [place], MD 11/12/19 [phone]

## 2019-11-07 NOTE — Discharge Instructions (Signed)

## 2019-11-07 NOTE — ED Triage Notes (Signed)
Pt to triage via w/c with no distress noted, mask in place; pt reports "burning" to upper chest since 230am; denies any accomp symptoms; denies hx of same

## 2019-11-13 DIAGNOSIS — H2513 Age-related nuclear cataract, bilateral: Secondary | ICD-10-CM | POA: Diagnosis not present

## 2019-11-13 DIAGNOSIS — H40013 Open angle with borderline findings, low risk, bilateral: Secondary | ICD-10-CM | POA: Diagnosis not present

## 2019-11-14 ENCOUNTER — Other Ambulatory Visit: Payer: Self-pay | Admitting: Family Medicine

## 2019-11-26 ENCOUNTER — Other Ambulatory Visit: Payer: Self-pay

## 2019-11-26 ENCOUNTER — Ambulatory Visit (INDEPENDENT_AMBULATORY_CARE_PROVIDER_SITE_OTHER): Payer: Medicare Other | Admitting: Family Medicine

## 2019-11-26 ENCOUNTER — Encounter: Payer: Self-pay | Admitting: Family Medicine

## 2019-11-26 VITALS — BP 133/76 | HR 84 | Temp 96.9°F | Wt 222.0 lb

## 2019-11-26 DIAGNOSIS — K219 Gastro-esophageal reflux disease without esophagitis: Secondary | ICD-10-CM | POA: Diagnosis not present

## 2019-11-26 DIAGNOSIS — I10 Essential (primary) hypertension: Secondary | ICD-10-CM | POA: Diagnosis not present

## 2019-11-26 NOTE — Progress Notes (Signed)
Patient: Amy Ray Female    DOB: 11-27-1945   74 y.o.   MRN: [id number] Visit Date: 11/26/2019  Today's Provider: Lelon Huh, MD   Chief Complaint  Patient presents with  . ER Follow Up   Subjective:     HPI  Follow Up ER Visit  Patient is here for ER follow up.  She was recently seen at [facility] for chest pain and GERD on 11/04/2019. Treatment for this included giving ASA, maalox, IV pepcid.Ordered labs, CXR, and serial HS-troponin x2. Close monitoring on telemetry. Pain resolved with maalox and pepcid. Patient was discharged home with a prescription for maalox and pepcid, and was advised to follow up with PCP.  She reports good compliance with treatment. She reports this condition is Improved. Has had no symptoms since starting famotidine prescribed at ER.   ------------------------------------------------------------------------------------  Allergies  Allergen Reactions  . Augmentin [Amoxicillin-Pot Clavulanate] Hives  . Quinapril-Hydrochlorothiazide Other (See Comments)    Other reaction(s): Headache     Current Outpatient Medications:  .  Acetaminophen (TYLENOL EX ST ARTHRITIS PAIN PO), Take 2 tablets by mouth daily as needed (for arthritis pain)., Disp: , Rfl:  .  ALPRAZolam (XANAX) 0.5 MG tablet, TAKE 1/2 TO 1 TABLET BY  MOUTH TWO TIMES DAILY, Disp: 90 tablet, Rfl: 5 .  alum & mag hydroxide-simeth (MAALOX MAX) 694-854-62 MG/5ML suspension, Take 5 mLs by mouth every 6 (six) hours as needed for indigestion., Disp: 355 mL, Rfl: 0 .  aspirin 81 MG tablet, Take 1 tablet by mouth daily., Disp: , Rfl:  .  carbamazepine (TEGRETOL) 200 MG tablet, TAKE 1 TABLET BY MOUTH TWICE DAILY, Disp: 180 tablet, Rfl: 4 .  clopidogrel (PLAVIX) 75 MG tablet, TAKE 1 TABLET BY MOUTH  DAILY, Disp: 90 tablet, Rfl: 1 .  cyanocobalamin 1000 MCG tablet, Take 1,000 mcg by mouth daily., Disp: , Rfl:  .  diltiazem (DILT-XR) 180 MG 24 hr capsule, TAKE 2 CAPSULES(360 MG) BY MOUTH DAILY,  Disp: 180 capsule, Rfl: 4 .  docusate sodium (COLACE) 100 MG capsule, Take 100 mg by mouth 2 (two) times daily as needed for mild constipation., Disp: , Rfl:  .  famotidine (PEPCID) 40 MG tablet, Take 1 tablet (40 mg total) by mouth at bedtime., Disp: 60 tablet, Rfl: 0 .  hydrochlorothiazide (HYDRODIURIL) 12.5 MG tablet, TAKE 1 TABLET BY MOUTH  DAILY, Disp: 90 tablet, Rfl: 2 .  IRON PO, Take 1 tablet by mouth daily., Disp: , Rfl:  .  nortriptyline (PAMELOR) 10 MG capsule, TAKE 1 TO 2 CAPSULES BY  MOUTH AT BEDTIME, Disp: 180 capsule, Rfl: 4 .  psyllium (METAMUCIL) 58.6 % packet, Take 1 packet by mouth daily., Disp: , Rfl:  .  simvastatin (ZOCOR) 20 MG tablet, TAKE 1 TABLET BY MOUTH  DAILY, Disp: 90 tablet, Rfl: 3  Review of Systems  Constitutional: Negative for appetite change, chills, fatigue and fever.  Respiratory: Negative for chest tightness and shortness of breath.   Cardiovascular: Negative for chest pain and palpitations.  Gastrointestinal: Negative for abdominal pain, nausea and vomiting.  Neurological: Negative for dizziness and weakness.    Social History   Tobacco Use  . Smoking status: Never Smoker  . Smokeless tobacco: Never Used  Substance Use Topics  . Alcohol use: No    Alcohol/week: 0.0 standard drinks      Objective:   BP 133/76 (BP Location: Right Arm, Patient Position: Sitting, Cuff Size: Normal)   Pulse 84   Temp Marland Kitchen)  96.9 F (36.1 C) (Temporal)   Wt 222 lb (100.7 kg)   BMI 43.36 kg/m  Vitals:   11/26/19 1336  BP: 133/76  Pulse: 84  Temp: (!) 96.9 F (36.1 C)  TempSrc: Temporal  Weight: 222 lb (100.7 kg)  Body mass index is 43.36 kg/m.   Physical Exam   General: Appearance:    Obese female in no acute distress  Eyes:    PERRL, conjunctiva/corneas clear, EOM's intact       Lungs:     Clear to auscultation bilaterally, respirations unlabored  Heart:    Normal heart rate. Normal rhythm. No murmurs, rubs, or gallops.   MS:   All extremities are  intact.   Neurologic:   Awake, alert, oriented x 3. No apparent focal neurological           defect.             Assessment & Plan    1. Gastroesophageal reflux disease without esophagitis Sx resolved since starting famotidine prescribed from ER  2. Essential (primary) hypertension Well controlled.  Continue current medications.   Follow up 4 months  The entirety of the information documented in the History of Present Illness, Review of Systems and Physical Exam were personally obtained by me. Portions of this information were initially documented by Martyn Ehrich, CMA and reviewed by me for thoroughness and accuracy.      Mila Merry, MD  [facility]

## 2019-12-15 ENCOUNTER — Other Ambulatory Visit: Payer: Self-pay | Admitting: Family Medicine

## 2019-12-27 ENCOUNTER — Other Ambulatory Visit: Payer: Self-pay | Admitting: Family Medicine

## 2019-12-27 MED ORDER — FAMOTIDINE 40 MG PO TABS: 40.0000 mg | ORAL_TABLET | Freq: Every day | ORAL | 1 refills | Status: DC

## 2019-12-27 NOTE — Telephone Encounter (Signed)
[facility] faxed refill request for the following medications:  famotidine (PEPCID) 40 MG tablet    Please advise.  Thanks, [employer]

## 2019-12-31 ENCOUNTER — Other Ambulatory Visit: Payer: Self-pay | Admitting: Family Medicine

## 2020-01-28 ENCOUNTER — Ambulatory Visit: Payer: Self-pay | Admitting: Family Medicine

## 2020-02-04 DIAGNOSIS — H35363 Drusen (degenerative) of macula, bilateral: Secondary | ICD-10-CM | POA: Diagnosis not present

## 2020-02-04 DIAGNOSIS — H25013 Cortical age-related cataract, bilateral: Secondary | ICD-10-CM | POA: Diagnosis not present

## 2020-02-04 DIAGNOSIS — H2512 Age-related nuclear cataract, left eye: Secondary | ICD-10-CM | POA: Diagnosis not present

## 2020-02-04 DIAGNOSIS — H2513 Age-related nuclear cataract, bilateral: Secondary | ICD-10-CM | POA: Diagnosis not present

## 2020-02-04 DIAGNOSIS — H52213 Irregular astigmatism, bilateral: Secondary | ICD-10-CM | POA: Diagnosis not present

## 2020-02-04 DIAGNOSIS — H40013 Open angle with borderline findings, low risk, bilateral: Secondary | ICD-10-CM | POA: Diagnosis not present

## 2020-02-05 ENCOUNTER — Other Ambulatory Visit: Payer: Self-pay

## 2020-02-05 ENCOUNTER — Encounter: Payer: Self-pay | Admitting: Emergency Medicine

## 2020-02-05 ENCOUNTER — Emergency Department: Payer: Medicare Other

## 2020-02-05 ENCOUNTER — Emergency Department
Admission: EM | Admit: 2020-02-05 | Discharge: 2020-02-05 | Disposition: A | Discharge status: Home / Self Care | Payer: Medicare Other | Attending: Student | Admitting: Student

## 2020-02-05 DIAGNOSIS — M546 Pain in thoracic spine: Secondary | ICD-10-CM | POA: Insufficient documentation

## 2020-02-05 DIAGNOSIS — M545 Low back pain: Secondary | ICD-10-CM | POA: Diagnosis not present

## 2020-02-05 DIAGNOSIS — S299XXA Unspecified injury of thorax, initial encounter: Secondary | ICD-10-CM | POA: Diagnosis not present

## 2020-02-05 DIAGNOSIS — Z79899 Other long term (current) drug therapy: Secondary | ICD-10-CM | POA: Diagnosis not present

## 2020-02-05 DIAGNOSIS — S3992XA Unspecified injury of lower back, initial encounter: Secondary | ICD-10-CM | POA: Diagnosis not present

## 2020-02-05 DIAGNOSIS — Y939 Activity, unspecified: Secondary | ICD-10-CM | POA: Insufficient documentation

## 2020-02-05 DIAGNOSIS — W06XXXA Fall from bed, initial encounter: Secondary | ICD-10-CM | POA: Insufficient documentation

## 2020-02-05 DIAGNOSIS — Y999 Unspecified external cause status: Secondary | ICD-10-CM | POA: Insufficient documentation

## 2020-02-05 DIAGNOSIS — Z7901 Long term (current) use of anticoagulants: Secondary | ICD-10-CM | POA: Insufficient documentation

## 2020-02-05 DIAGNOSIS — Y929 Unspecified place or not applicable: Secondary | ICD-10-CM | POA: Insufficient documentation

## 2020-02-05 DIAGNOSIS — Z7982 Long term (current) use of aspirin: Secondary | ICD-10-CM | POA: Insufficient documentation

## 2020-02-05 DIAGNOSIS — M542 Cervicalgia: Secondary | ICD-10-CM | POA: Diagnosis not present

## 2020-02-05 DIAGNOSIS — I10 Essential (primary) hypertension: Secondary | ICD-10-CM | POA: Insufficient documentation

## 2020-02-05 DIAGNOSIS — W19XXXA Unspecified fall, initial encounter: Secondary | ICD-10-CM

## 2020-02-05 DIAGNOSIS — R519 Headache, unspecified: Secondary | ICD-10-CM | POA: Insufficient documentation

## 2020-02-05 DIAGNOSIS — S0990XA Unspecified injury of head, initial encounter: Secondary | ICD-10-CM | POA: Diagnosis not present

## 2020-02-05 MED ORDER — LIDOCAINE 5 % EX PTCH: 1.0000 | MEDICATED_PATCH | CUTANEOUS | 0 refills | Status: DC

## 2020-02-05 NOTE — ED Notes (Signed)
Pt ambulated to the bathroom using walker as assistance.

## 2020-02-05 NOTE — ED Provider Notes (Signed)
[facility] Emergency Department Provider Note  ____________________________________________  Time seen: Approximately 7:39 AM  I have reviewed the triage vital signs and the nursing notes.   HISTORY  Chief Complaint Fall and Back Pain    HPI Amy Ray is a 74 y.o. female that presents to the emergency department for evaluation of low back pain after falling today.  Patient was getting out of bed and fell.  She landed on her back. She is sore to her upper back, near her shoulders. She did not hit her head or lose consciousness.  She is on Plavix.  She feels good currently.  Patient describes her pain as soreness.  She does not think that anything is broken.  No headache, shortness of breath, chest pain, abdominal pain.   Past Medical History:  Diagnosis Date  . History of diverticulitis of colon 05/20/2015   per patient report   . Hyperlipidemia   . Hypertension   . Stroke (HCC)   . TIA (transient ischemic attack)   . Vertigo     Patient Active Problem List   Diagnosis Date Noted  . Low back pain 05/20/2015  . Fatigue 05/20/2015  . History of CVA (cerebrovascular accident) 05/20/2015  . Insomnia 05/20/2015  . Morbid obesity (HCC) 05/20/2015  . Positive H. pylori test 05/20/2015  . Tremor 05/20/2015  . Dysfunction of eustachian tube 05/20/2015  . Diverticulosis 05/20/2015  . Essential (primary) hypertension 07/17/2007  . Alterations of sensations, late effect of cerebrovascular disease 03/11/2005  . Hyperlipidemia 10/12/2003  . Trigeminal neuralgia 10/12/2003    Past Surgical History:  Procedure Laterality Date  . CHOLECYSTECTOMY  2000  . HERNIA REPAIR     umbilical hernia as a child  . REPLACEMENT TOTAL KNEE Bilateral     Prior to Admission medications   Medication Sig Start Date End Date Taking? Authorizing Provider  ALPRAZolam [name]) 0.5 MG tablet TAKE 1/2 TO 1 TABLET BY  MOUTH TWICE DAILY 12/27/19   Malva Limes, MD   Acetaminophen (TYLENOL EX ST ARTHRITIS PAIN PO) Take 2 tablets by mouth daily as needed (for arthritis pain).    [provider]  alum & mag hydroxide-simeth (MAALOX MAX) 400-400-40 MG/5ML suspension Take 5 mLs by mouth every 6 (six) hours as needed for indigestion. 11/07/19   Nita Sickle, MD  aspirin 81 MG tablet Take 1 tablet by mouth daily.    [provider]  carbamazepine (TEGRETOL) 200 MG tablet TAKE 1 TABLET BY MOUTH TWICE DAILY 06/21/19   Malva Limes, MD  clopidogrel (PLAVIX) 75 MG tablet TAKE 1 TABLET BY MOUTH  DAILY 09/10/19   Malva Limes, MD  cyanocobalamin 1000 MCG tablet Take 1,000 mcg by mouth daily.    [provider]  diltiazem (DILT-XR) 180 MG 24 hr capsule TAKE 2 CAPSULES BY MOUTH  (360MG ) DAILY 12/16/19   02/15/20, MD  docusate sodium (COLACE) 100 MG capsule Take 100 mg by mouth 2 (two) times daily as needed for mild constipation.    [provider]  famotidine (PEPCID) 40 MG tablet Take 1 tablet (40 mg total) by mouth at bedtime. 12/27/19 12/26/20  12/28/20, MD  hydrochlorothiazide (HYDRODIURIL) 12.5 MG tablet TAKE 1 TABLET BY MOUTH  DAILY 08/29/19   08/31/19, MD  IRON PO Take 1 tablet by mouth daily.    [provider]  lidocaine (LIDODERM) 5 % Place 1 patch onto the skin daily. Remove & Discard patch within 12 hours  or as directed by MD 02/05/20   Enid Derry, PA-C  nortriptyline (PAMELOR) 10 MG capsule TAKE 1 TO 2 CAPSULES BY  MOUTH AT BEDTIME 02/26/19   Malva Limes, MD  psyllium (METAMUCIL) 58.6 % packet Take 1 packet by mouth daily.    [provider]  simvastatin (ZOCOR) 20 MG tablet TAKE 1 TABLET BY MOUTH  DAILY 11/14/19   Malva Limes, MD    Allergies Augmentin [amoxicillin-pot clavulanate] and Quinapril-hydrochlorothiazide  Family History  Problem Relation Age of Onset  . Heart attack Mother   . Hypertension Mother   . Alzheimer's disease Father   . Diabetes Father    . Hypertension Sister   . Diabetes Brother   . Hypertension Sister   . Hypertension Sister   . Hypertension Sister   . Hypertension Sister   . Breast cancer Sister 21  . Breast cancer Maternal Aunt 6    Social History Social History   Tobacco Use  . Smoking status: Never Smoker  . Smokeless tobacco: Never Used  Substance Use Topics  . Alcohol use: No    Alcohol/week: 0.0 standard drinks  . Drug use: No     Review of Systems  Cardiovascular: No chest pain. Respiratory: No SOB. Gastrointestinal: No abdominal pain.  No nausea, no vomiting.  Musculoskeletal: Positive for back pain. Skin: Negative for rash, abrasions, lacerations, ecchymosis. Neurological: Negative for headaches   ____________________________________________   PHYSICAL EXAM:  VITAL SIGNS: ED Triage Vitals  Enc Vitals Group     BP 02/05/20 0605 130/71     Pulse Rate 02/05/20 0605 80     Resp 02/05/20 0605 18     Temp 02/05/20 0605 98.8 F (37.1 C)     Temp Source 02/05/20 0605 Oral     SpO2 02/05/20 0605 100 %     Weight 02/05/20 0607 212 lb (96.2 kg)     Height 02/05/20 0607 5' (1.524 m)     Head Circumference --      Peak Flow --      Pain Score 02/05/20 0606 6     Pain Loc --      Pain Edu? --      Excl. in GC? --      Constitutional: Alert and oriented. Well appearing and in no acute distress. Eyes: Conjunctivae are normal. PERRL. EOMI. Head: Atraumatic. ENT:      Ears:      Nose: No congestion/rhinnorhea.      Mouth/Throat: Mucous membranes are moist.  Neck: No stridor.  No cervical spine tenderness to palpation. Cardiovascular: Normal rate, regular rhythm.  Good peripheral circulation. Respiratory: Normal respiratory effort without tachypnea or retractions. Lungs CTAB. Good air entry to the bases with no decreased or absent breath sounds. Gastrointestinal: Bowel sounds 4 quadrants. Soft and nontender to palpation. No guarding or rigidity. No palpable masses. No  distention. Musculoskeletal: Full range of motion to all extremities. No gross deformities appreciated.  Diffuse minimal tenderness to palpation throughout lumbar spine, thoracic spine, and paraspinal muscles. No pinpoint tenderness to palpation.  No tenderness to palpation to bilateral hips.  Full range of motion of bilateral hips.  Ambulatory with cane to the bathroom. Neurologic:  Normal speech and language. No gross focal neurologic deficits are appreciated.  Skin:  Skin is warm, dry and intact. No rash noted. Psychiatric: Mood and affect are normal. Speech and behavior are normal. Patient exhibits appropriate insight and judgement.   ____________________________________________   LABS (all labs ordered are  listed, but only abnormal results are displayed)  Labs Reviewed - No data to display ____________________________________________  EKG   ____________________________________________  RADIOLOGY Robinette Haines, personally viewed and evaluated these images (plain radiographs) as part of my medical decision making, as well as reviewing the written report by the radiologist.  DG Chest 2 View  Result Date: 02/05/2020 CLINICAL DATA:  Slipped off a stool getting into bed this morning landing on LEFT side, LEFT side in lower back pain EXAM: CHEST - 2 VIEW COMPARISON:  11/07/2019 FINDINGS: Normal heart size, mediastinal contours, and pulmonary vascularity. Atherosclerotic calcification aorta. Lungs clear. No infiltrate, pleural effusion or pneumothorax. Multilevel endplate spur formation thoracic spine. Osseous demineralization. Advanced glenohumeral degenerative changes RIGHT shoulder with evidence of prior LEFT shoulder joint arthroplasty. No acute osseous findings. IMPRESSION: No acute abnormalities. Electronically Signed   By: Lavonia Dana M.D.   On: 02/05/2020 08:58   DG Thoracic Spine 2 View  Result Date: 02/05/2020 CLINICAL DATA:  Back pain.  Fell off stool this morning. EXAM:  THORACIC SPINE 2 VIEWS COMPARISON:  No comparison studies available. FINDINGS: Two-view exam shows no evidence for an acute cervical spine fracture. Diffuse loss of intervertebral disc height noted with endplate spurring. Bilateral shoulder replacement obscures the upper thoracic spine and cervicothoracic junction on the lateral projections. IMPRESSION: No acute thoracic spine fracture evident with limited assessment of the upper thoracic spine and cervicothoracic junction due to superimposition of shoulder hardware on lateral projections. If there is high clinical index of suspicion for injury in the cervicothoracic junction or upper thoracic spine, CT or MR imaging could be used to further evaluate. Electronically Signed   By: Misty Stanley M.D.   On: 02/05/2020 08:59   DG Lumbar Spine 2-3 Views  Result Date: 02/05/2020 CLINICAL DATA:  [place] onto LEFT side this morning, back pain EXAM: LUMBAR SPINE - 2-3 VIEW COMPARISON:  None FINDINGS: IUD projects over pelvis. 5 non-rib-bearing lumbar vertebra. Diffuse osseous demineralization. Multilevel degenerative disc and facet disease changes of lumbar spine. Vertebral body heights maintained without definite fracture or bone destruction. Grade 1 anterolisthesis L4-L5 likely degenerative. Slight widening and sclerosis at the LEFT SI joint versus RIGHT suggesting asymmetric sacroiliitis. IMPRESSION: Multilevel degenerative disc and facet disease changes of lumbar spine. No acute osseous abnormalities. Question asymmetric LEFT sacroiliitis. Electronically Signed   By: Lavonia Dana M.D.   On: 02/05/2020 09:00   CT Head Wo Contrast  Result Date: 02/05/2020 CLINICAL DATA:  Fall, left side pain EXAM: CT HEAD WITHOUT CONTRAST TECHNIQUE: Contiguous axial images were obtained from the base of the skull through the vertex without intravenous contrast. COMPARISON:  10/15/2017 FINDINGS: Brain: There is atrophy and chronic small vessel disease changes. No acute intracranial  abnormality. Specifically, no hemorrhage, hydrocephalus, mass lesion, acute infarction, or significant intracranial injury. Vascular: No hyperdense vessel or unexpected calcification. Skull: No acute calvarial abnormality. Sinuses/Orbits: Visualized paranasal sinuses and mastoids clear. Orbital soft tissues unremarkable. Other: None IMPRESSION: Atrophy, chronic microvascular disease. No acute intracranial abnormality. Electronically Signed   By: Rolm Baptise M.D.   On: 02/05/2020 08:44   CT Cervical Spine Wo Contrast  Result Date: 02/05/2020 CLINICAL DATA:  Fall, left side pain EXAM: CT CERVICAL SPINE WITHOUT CONTRAST TECHNIQUE: Multidetector CT imaging of the cervical spine was performed without intravenous contrast. Multiplanar CT image reconstructions were also generated. COMPARISON:  None. FINDINGS: Alignment: Normal Skull base and vertebrae: Choose 1 Soft tissues and spinal canal: Choose 1 Disc levels: Diffuse moderate degenerative facet disease. Degenerative  disc disease from C4-5 through C7-T1 with disc space narrowing and spurring. Upper chest: Negative acute Other: None IMPRESSION: Cervical spondylosis.  No acute bony abnormality. Electronically Signed   By: Charlett Nose M.D.   On: 02/05/2020 08:53    ____________________________________________    PROCEDURES  Procedure(s) performed:    Procedures    Medications - No data to display   ____________________________________________   INITIAL IMPRESSION / ASSESSMENT AND PLAN / ED COURSE  Pertinent labs & imaging results that were available during my care of the patient were reviewed by me and considered in my medical decision making (see chart for details).  Review of the  CSRS was performed in accordance of the [place] prior to dispensing any controlled drugs.   Patient presented to emergency department for evaluation after fall today.  Vital signs and exam are reassuring.  CT scans and x-rays are negative for acute  abnormalities.  Patient is ambulatory to the bathroom multiple times with her cane.  Patient was provided with a walker and does enjoy using the walker more than her cane.  Patient will be discharged home with prescriptions for Lidoderm. Patient is to follow up with primary care as directed. Patient is given ED precautions to return to the ED for any worsening or new symptoms.  Amy Ray was evaluated in Emergency Department on 02/05/2020 for the symptoms described in the history of present illness. She was evaluated in the context of the global COVID-19 pandemic, which necessitated consideration that the patient might be at risk for infection with the SARS-CoV-2 virus that causes COVID-19. Institutional protocols and algorithms that pertain to the evaluation of patients at risk for COVID-19 are in a state of rapid change based on information released by regulatory bodies including the CDC and federal and state organizations. These policies and algorithms were followed during the patient's care in the ED.   ____________________________________________  FINAL CLINICAL IMPRESSION(S) / ED DIAGNOSES  Final diagnoses:  Fall, initial encounter      NEW MEDICATIONS STARTED DURING THIS VISIT:  ED Discharge Orders         Ordered    lidocaine (LIDODERM) 5 %  Every 24 hours     02/05/20 0945              This chart was dictated using voice recognition software/Dragon. Despite best efforts to proofread, errors can occur which can change the meaning. Any change was purely unintentional.    Enid Derry, PA-C 02/05/20 1001    Miguel Aschoff., MD 02/05/20 [phone]

## 2020-02-05 NOTE — ED Triage Notes (Signed)
Pt presents to ED after she slipped off her stool while getting into bed this morning. Pt states she landed on her left side and is now c/o left side pain and lower back pain. Pt landed on carpeted flooring. Pt alert and talkative during triage. No distress noted.

## 2020-02-12 DIAGNOSIS — H25812 Combined forms of age-related cataract, left eye: Secondary | ICD-10-CM | POA: Diagnosis not present

## 2020-02-12 DIAGNOSIS — H2512 Age-related nuclear cataract, left eye: Secondary | ICD-10-CM | POA: Diagnosis not present

## 2020-02-12 HISTORY — PX: OTHER SURGICAL HISTORY: SHX169

## 2020-02-20 DIAGNOSIS — H2512 Age-related nuclear cataract, left eye: Secondary | ICD-10-CM | POA: Diagnosis not present

## 2020-03-28 DIAGNOSIS — Z8673 Personal history of transient ischemic attack (TIA), and cerebral infarction without residual deficits: Secondary | ICD-10-CM | POA: Diagnosis not present

## 2020-03-28 DIAGNOSIS — I10 Essential (primary) hypertension: Secondary | ICD-10-CM | POA: Diagnosis not present

## 2020-03-28 DIAGNOSIS — E782 Mixed hyperlipidemia: Secondary | ICD-10-CM | POA: Diagnosis not present

## 2020-03-28 DIAGNOSIS — K219 Gastro-esophageal reflux disease without esophagitis: Secondary | ICD-10-CM | POA: Diagnosis not present

## 2020-03-28 NOTE — Progress Notes (Signed)
Established patient visit   Patient: Amy Ray   DOB: [birth year]   74 y.o. Female  MRN: [id number] Visit Date: 03/31/2020  Today's healthcare provider: Mila Merry, MD   Chief Complaint  Patient presents with   Hypertension   Gastroesophageal Reflux   Subjective    HPI Hypertension, follow-up  BP Readings from Last 3 Encounters:  03/31/20 (!) 142/80  02/05/20 130/71  11/26/19 133/76   Wt Readings from Last 3 Encounters:  03/31/20 219 lb (99.3 kg)  02/05/20 212 lb (96.2 kg)  11/26/19 222 lb (100.7 kg)     She was last seen for hypertension 4 months ago.  BP at that visit was 133/76. Management since that visit includes continue same medications.  She reports good compliance with treatment. She is not having side effects.  She is following a Regular diet. She is exercising. She does not smoke.  Use of agents associated with hypertension: NSAIDS.   Outside blood pressures are 140/90. Symptoms: No chest pain No chest pressure  No palpitations No syncope  No dyspnea No orthopnea  No paroxysmal nocturnal dyspnea Yes lower extremity edema   Pertinent labs: Lab Results  Component Value Date   CHOL 173 07/30/2019   HDL 94 07/30/2019   LDLCALC 65 07/30/2019   TRIG 72 07/30/2019   CHOLHDL 1.8 07/30/2019   Lab Results  Component Value Date   NA 140 11/07/2019   K 3.6 11/07/2019   CREATININE 1.24 (H) 11/07/2019   GFRNONAA 43 (L) 11/07/2019   GFRAA 50 (L) 11/07/2019   GLUCOSE 107 (H) 11/07/2019     The ASCVD Risk score Denman George DC Jr., et al., 2013) failed to calculate for the following reasons:   The patient has a prior MI or stroke diagnosis   ---------------------------------------------------------------------------------------------------  GERD, Follow up:  The patient was last seen for GERD 4 months ago. Changes made since that visit include none; symptoms resolved since starting famotidine prescribed from ER. Continue same medication.  She  reports good compliance with treatment. She is not having side effects.   -----------------------------------------------------------------------------------------      Medications: Outpatient Medications Prior to Visit  Medication Sig   Acetaminophen (TYLENOL EX ST ARTHRITIS PAIN PO) Take 2 tablets by mouth daily as needed (for arthritis pain).   ALPRAZolam (XANAX) 0.5 MG tablet TAKE 1/2 TO 1 TABLET BY  MOUTH TWICE DAILY   alum & mag hydroxide-simeth (MAALOX MAX) 400-400-40 MG/5ML suspension Take 5 mLs by mouth every 6 (six) hours as needed for indigestion.   aspirin 81 MG tablet Take 1 tablet by mouth daily.   carbamazepine (TEGRETOL) 200 MG tablet TAKE 1 TABLET BY MOUTH TWICE DAILY   clopidogrel (PLAVIX) 75 MG tablet TAKE 1 TABLET BY MOUTH  DAILY   cyanocobalamin 1000 MCG tablet Take 1,000 mcg by mouth daily.   diltiazem (DILT-XR) 180 MG 24 hr capsule TAKE 2 CAPSULES BY MOUTH  (360MG ) DAILY   docusate sodium (COLACE) 100 MG capsule Take 100 mg by mouth 2 (two) times daily as needed for mild constipation.   famotidine (PEPCID) 40 MG tablet Take 1 tablet (40 mg total) by mouth at bedtime.   hydrochlorothiazide (HYDRODIURIL) 12.5 MG tablet TAKE 1 TABLET BY MOUTH  DAILY   IRON PO Take 1 tablet by mouth daily.   lidocaine (LIDODERM) 5 % Place 1 patch onto the skin daily. Remove & Discard patch within 12 hours or as directed by MD   nortriptyline (PAMELOR) 10 MG capsule TAKE 1 TO  2 CAPSULES BY  MOUTH AT BEDTIME   psyllium (METAMUCIL) 58.6 % packet Take 1 packet by mouth daily.   simvastatin (ZOCOR) 20 MG tablet TAKE 1 TABLET BY MOUTH  DAILY   No facility-administered medications prior to visit.    Review of Systems  Constitutional: Negative for appetite change, chills, fatigue and fever.  Respiratory: Negative for chest tightness and shortness of breath.   Cardiovascular: Positive for leg swelling. Negative for chest pain and palpitations.  Gastrointestinal: Negative  for abdominal pain, nausea and vomiting.  Neurological: Negative for dizziness and weakness.      Objective    BP (!) 142/80 (BP Location: Right Arm, Cuff Size: Large)    Pulse (!) 57    Temp (!) 97.3 F (36.3 C) (Temporal)    Resp 18    Wt 219 lb (99.3 kg)    SpO2 96%    BMI 42.77 kg/m  Vitals:   03/31/20 0952 03/31/20 0958 03/31/20 1008  BP: (!) 142/80 (!) 142/80 136/78  Pulse: (!) 57    Resp: 18    Temp: (!) 97.3 F (36.3 C)    TempSrc: Temporal    SpO2: 96%    Weight: 219 lb (99.3 kg)        Physical Exam  General appearance: Severely obese female, cooperative and in no acute distress Head: Normocephalic, without obvious abnormality, atraumatic Respiratory: Respirations even and unlabored, normal respiratory rate Extremities: All extremities are intact.  Skin: Skin color, texture, turgor normal. No rashes seen  Psych: Appropriate mood and affect. Neurologic: Mental status: Alert, oriented to person, place, and time, thought content appropriate.   No results found for any visits on 03/31/20.  Assessment & Plan     1. Essential (primary) hypertension BP up a bit today likely secondary to weight gain. Continue current medications.  Work on exercising and eating healthier to lose weight.  - Renal function panel  2. Morbid obesity ([place]) Work on weigh loss as above.   3. Alterations of sensations, late effect of cerebrovascular disease   4. Trigeminal neuralgia Well controlled on current medications regiment.   5. History of CVA (cerebrovascular accident) Continue aggressive secondary risk factor modification. Tolerating medications well.   Follow up 6 months.     No follow-ups on file.         Lelon Huh, MD  [facility] [phone] (phone) [phone] (fax)  [place]

## 2020-03-31 ENCOUNTER — Encounter: Payer: Self-pay | Admitting: Family Medicine

## 2020-03-31 ENCOUNTER — Ambulatory Visit (INDEPENDENT_AMBULATORY_CARE_PROVIDER_SITE_OTHER): Payer: Medicare Other | Admitting: Family Medicine

## 2020-03-31 ENCOUNTER — Other Ambulatory Visit: Payer: Self-pay

## 2020-03-31 VITALS — BP 136/78 | HR 57 | Temp 97.3°F | Resp 18 | Wt 219.0 lb

## 2020-03-31 DIAGNOSIS — Z8673 Personal history of transient ischemic attack (TIA), and cerebral infarction without residual deficits: Secondary | ICD-10-CM | POA: Diagnosis not present

## 2020-03-31 DIAGNOSIS — G500 Trigeminal neuralgia: Secondary | ICD-10-CM | POA: Diagnosis not present

## 2020-03-31 DIAGNOSIS — I10 Essential (primary) hypertension: Secondary | ICD-10-CM

## 2020-03-31 DIAGNOSIS — I69998 Other sequelae following unspecified cerebrovascular disease: Secondary | ICD-10-CM | POA: Diagnosis not present

## 2020-03-31 DIAGNOSIS — R209 Unspecified disturbances of skin sensation: Secondary | ICD-10-CM

## 2020-04-01 ENCOUNTER — Telehealth: Payer: Self-pay

## 2020-04-01 ENCOUNTER — Other Ambulatory Visit: Payer: Self-pay | Admitting: Family Medicine

## 2020-04-01 DIAGNOSIS — Z1231 Encounter for screening mammogram for malignant neoplasm of breast: Secondary | ICD-10-CM

## 2020-04-01 LAB — RENAL FUNCTION PANEL
Albumin: 4.8 g/dL — ABNORMAL HIGH (ref 3.7–4.7)
BUN/Creatinine Ratio: 16 (ref 12–28)
BUN: 20 mg/dL (ref 8–27)
CO2: 21 mmol/L (ref 20–29)
Calcium: 9.5 mg/dL (ref 8.7–10.3)
Chloride: 103 mmol/L (ref 96–106)
Creatinine, Ser: 1.22 mg/dL — ABNORMAL HIGH (ref 0.57–1.00)
GFR calc Af Amer: 51 mL/min/{1.73_m2} — ABNORMAL LOW (ref >59)
GFR calc non Af Amer: 44 mL/min/{1.73_m2} — ABNORMAL LOW (ref >59)
Glucose: 83 mg/dL (ref 65–99)
Phosphorus: 4.6 mg/dL — ABNORMAL HIGH (ref 3.0–4.3)
Sodium: 142 mmol/L (ref 134–144)

## 2020-04-01 NOTE — Telephone Encounter (Signed)
Patient advised.

## 2020-04-01 NOTE — Telephone Encounter (Signed)
LMT[facility], [facility] Triage Nurse may give patient results  

## 2020-04-01 NOTE — Telephone Encounter (Signed)
-----   Message from Malva Limes, MD sent at 04/01/2020  7:23 AM EDT ----- Kidney functions stable. Normal electrolytes. Continue current medications.  Follow up in December as scheduled.

## 2020-04-14 DIAGNOSIS — H59032 Cystoid macular edema following cataract surgery, left eye: Secondary | ICD-10-CM | POA: Diagnosis not present

## 2020-04-14 DIAGNOSIS — H2589 Other age-related cataract: Secondary | ICD-10-CM | POA: Diagnosis not present

## 2020-04-14 DIAGNOSIS — H25011 Cortical age-related cataract, right eye: Secondary | ICD-10-CM | POA: Diagnosis not present

## 2020-04-14 DIAGNOSIS — H2511 Age-related nuclear cataract, right eye: Secondary | ICD-10-CM | POA: Diagnosis not present

## 2020-04-22 ENCOUNTER — Encounter: Payer: Self-pay | Admitting: Family Medicine

## 2020-04-26 ENCOUNTER — Other Ambulatory Visit: Payer: Self-pay | Admitting: Family Medicine

## 2020-04-26 NOTE — Telephone Encounter (Signed)
Requested Prescriptions  Pending Prescriptions Disp Refills  . diltiazem (DILT-XR) 180 MG 24 hr capsule [Pharmacy Med Name: DILT  180MG   CAP  XR] 180 capsule 1    Sig: TAKE 2 CAPSULES BY MOUTH  DAILY     Cardiovascular:  Calcium Channel Blockers Passed - 04/26/2020 10:07 PM      Passed - Last BP in normal range    BP Readings from Last 1 Encounters:  03/31/20 136/78         Passed - Valid encounter within last 6 months    Recent Outpatient Visits          3 weeks ago Essential (primary) hypertension   [facility], MD   5 months ago Gastroesophageal reflux disease without esophagitis   [facility], MD   9 months ago Morbid obesity [facility])   [facility], MD   1 year ago Essential (primary) hypertension   [facility], MD   1 year ago Trigeminal neuralgia   [facility] Fisher, [facility], MD      Future Appointments            In 5 months Fisher, Demetrios Isaacs, MD [facility], [facility]

## 2020-04-29 ENCOUNTER — Encounter (INDEPENDENT_AMBULATORY_CARE_PROVIDER_SITE_OTHER): Payer: Medicare Other | Admitting: Ophthalmology

## 2020-04-29 ENCOUNTER — Other Ambulatory Visit: Payer: Self-pay

## 2020-04-29 DIAGNOSIS — H43813 Vitreous degeneration, bilateral: Secondary | ICD-10-CM | POA: Diagnosis not present

## 2020-04-29 DIAGNOSIS — H35033 Hypertensive retinopathy, bilateral: Secondary | ICD-10-CM

## 2020-04-29 DIAGNOSIS — I10 Essential (primary) hypertension: Secondary | ICD-10-CM | POA: Diagnosis not present

## 2020-04-29 DIAGNOSIS — H59032 Cystoid macular edema following cataract surgery, left eye: Secondary | ICD-10-CM

## 2020-05-06 ENCOUNTER — Encounter (INDEPENDENT_AMBULATORY_CARE_PROVIDER_SITE_OTHER): Payer: Medicare Other | Admitting: Ophthalmology

## 2020-05-07 ENCOUNTER — Other Ambulatory Visit: Payer: Self-pay | Admitting: Family Medicine

## 2020-05-07 DIAGNOSIS — I10 Essential (primary) hypertension: Secondary | ICD-10-CM

## 2020-05-07 NOTE — Telephone Encounter (Signed)
Requested Prescriptions  Pending Prescriptions Disp Refills  . hydrochlorothiazide (HYDRODIURIL) 12.5 MG tablet [Pharmacy Med Name: HYDROCHLOROTHIAZIDE  12.5MG   TAB] 90 tablet 1    Sig: TAKE 1 TABLET BY MOUTH  DAILY     Cardiovascular: Diuretics - Thiazide Failed - 05/07/2020  6:29 AM      Failed - Cr in normal range and within 360 days    Creatinine  Date Value Ref Range Status  01/31/2015 0.92 mg/dL Final    Comment:    2.83-1.51 NOTE: New Reference Range  12/17/14    Creatinine, Ser  Date Value Ref Range Status  03/31/2020 1.22 (H) 0.57 - 1.00 mg/dL Final         Passed - Ca in normal range and within 360 days    Calcium  Date Value Ref Range Status  03/31/2020 9.5 8.7 - 10.3 mg/dL Final   Calcium, Total  Date Value Ref Range Status  01/31/2015 8.4 (L) mg/dL Final    Comment:    7.6-16.0 NOTE: New Reference Range  12/17/14          Passed - K in normal range and within 360 days    Potassium  Date Value Ref Range Status  03/31/2020 CANCELED mmol/L     Comment:    Test not performed. Specimen is hemolyzed. Unable to obtain valid results.  Result canceled by the ancillary.   01/31/2015 3.7 mmol/L Final    Comment:    3.5-5.1 NOTE: New Reference Range  12/17/14          Passed - Na in normal range and within 360 days    Sodium  Date Value Ref Range Status  03/31/2020 142 134 - 144 mmol/L Final  01/31/2015 138 mmol/L Final    Comment:    135-145 NOTE: New Reference Range  12/17/14          Passed - Last BP in normal range    BP Readings from Last 1 Encounters:  03/31/20 136/78         Passed - Valid encounter within last 6 months    Recent Outpatient Visits          1 month ago Essential (primary) hypertension   [facility] Malva Limes, MD   5 months ago Gastroesophageal reflux disease without esophagitis   [facility] Malva Limes, MD   9 months ago Morbid obesity [facility])   [facility]  Malva Limes, MD   1 year ago Essential (primary) hypertension   [facility] Malva Limes, MD   1 year ago Trigeminal neuralgia   [facility] Fisher, Demetrios Isaacs, MD      Future Appointments            In 4 months Fisher, Demetrios Isaacs, MD [facility], [facility]

## 2020-05-08 DIAGNOSIS — Z96653 Presence of artificial knee joint, bilateral: Secondary | ICD-10-CM | POA: Diagnosis not present

## 2020-05-12 ENCOUNTER — Telehealth: Payer: Self-pay | Admitting: Family Medicine

## 2020-05-12 ENCOUNTER — Ambulatory Visit
Admission: RE | Admit: 2020-05-12 | Discharge: 2020-05-12 | Disposition: A | Discharge status: Home / Self Care | Payer: Medicare Other | Source: Ambulatory Visit | Attending: Family Medicine | Admitting: Family Medicine

## 2020-05-12 ENCOUNTER — Other Ambulatory Visit: Payer: Self-pay

## 2020-05-12 DIAGNOSIS — Z1231 Encounter for screening mammogram for malignant neoplasm of breast: Secondary | ICD-10-CM | POA: Insufficient documentation

## 2020-05-12 MED ORDER — NORTRIPTYLINE HCL 10 MG PO CAPS: 10.0000 mg | ORAL_CAPSULE | Freq: Every day | ORAL | 4 refills | Status: DC

## 2020-05-12 NOTE — Telephone Encounter (Signed)
[facility] faxed refill request for the following medications:   PAMELOR capsule  Please advise.  Thanks, [employer]

## 2020-05-18 ENCOUNTER — Encounter: Payer: Self-pay | Admitting: Emergency Medicine

## 2020-05-18 ENCOUNTER — Other Ambulatory Visit: Payer: Self-pay

## 2020-05-18 ENCOUNTER — Emergency Department
Admission: EM | Admit: 2020-05-18 | Discharge: 2020-05-18 | Disposition: A | Discharge status: Home / Self Care | Payer: Medicare Other | Attending: Emergency Medicine | Admitting: Emergency Medicine

## 2020-05-18 DIAGNOSIS — R1032 Left lower quadrant pain: Secondary | ICD-10-CM | POA: Diagnosis not present

## 2020-05-18 DIAGNOSIS — Z79899 Other long term (current) drug therapy: Secondary | ICD-10-CM | POA: Diagnosis not present

## 2020-05-18 DIAGNOSIS — R1030 Lower abdominal pain, unspecified: Secondary | ICD-10-CM | POA: Diagnosis not present

## 2020-05-18 DIAGNOSIS — I10 Essential (primary) hypertension: Secondary | ICD-10-CM | POA: Insufficient documentation

## 2020-05-18 LAB — URINALYSIS, COMPLETE (UACMP) WITH MICROSCOPIC
Bacteria, UA: NONE SEEN
Bilirubin Urine: NEGATIVE
Glucose, UA: NEGATIVE mg/dL
Hgb urine dipstick: NEGATIVE
Ketones, ur: NEGATIVE mg/dL
Leukocytes,Ua: NEGATIVE
Nitrite: NEGATIVE
Protein, ur: NEGATIVE mg/dL
Specific Gravity, Urine: 1.017 (ref 1.005–1.030)
pH: 5 (ref 5.0–8.0)

## 2020-05-18 LAB — COMPREHENSIVE METABOLIC PANEL
ALT: 27 U/L (ref 0–44)
AST: 27 U/L (ref 15–41)
Albumin: 4.3 g/dL (ref 3.5–5.0)
Alkaline Phosphatase: 105 U/L (ref 38–126)
Anion gap: 11 (ref 5–15)
BUN: 21 mg/dL (ref 8–23)
CO2: 24 mmol/L (ref 22–32)
Calcium: 9.1 mg/dL (ref 8.9–10.3)
Chloride: 106 mmol/L (ref 98–111)
Creatinine, Ser: 1.03 mg/dL — ABNORMAL HIGH (ref 0.44–1.00)
GFR calc Af Amer: >60 mL/min (ref >60)
GFR calc non Af Amer: 54 mL/min — ABNORMAL LOW (ref >60)
Glucose, Bld: 86 mg/dL (ref 70–99)
Potassium: 4.3 mmol/L (ref 3.5–5.1)
Sodium: 141 mmol/L (ref 135–145)
Total Bilirubin: 0.7 mg/dL (ref 0.3–1.2)
Total Protein: 8.3 g/dL — ABNORMAL HIGH (ref 6.5–8.1)

## 2020-05-18 LAB — CBC
HCT: 40.4 % (ref 36.0–46.0)
Hemoglobin: 14.1 g/dL (ref 12.0–15.0)
MCH: 33.7 pg (ref 26.0–34.0)
MCHC: 34.9 g/dL (ref 30.0–36.0)
MCV: 96.7 fL (ref 80.0–100.0)
Platelets: 197 10*3/uL (ref 150–400)
RBC: 4.18 MIL/uL (ref 3.87–5.11)
RDW: 11.9 % (ref 11.5–15.5)
WBC: 7.4 10*3/uL (ref 4.0–10.5)
nRBC: 0 % (ref 0.0–0.2)

## 2020-05-18 LAB — LIPASE, BLOOD: Lipase: 29 U/L (ref 11–51)

## 2020-05-18 MED ORDER — SODIUM CHLORIDE 0.9% FLUSH: 3.0000 mL | Freq: Once | INTRAVENOUS | Status: DC

## 2020-05-18 MED ORDER — ALUM & MAG HYDROXIDE-SIMETH 200-200-20 MG/5ML PO SUSP
15.0000 mL | Freq: Once | ORAL | Status: AC
  Administered 2020-05-18: 15 mL via ORAL
  Filled 2020-05-18: qty 30

## 2020-05-18 MED ORDER — FAMOTIDINE 20 MG PO TABS
20.0000 mg | ORAL_TABLET | Freq: Once | ORAL | Status: AC
  Administered 2020-05-18: 20 mg via ORAL
  Filled 2020-05-18: qty 1

## 2020-05-18 NOTE — ED Provider Notes (Signed)
[facility] Emergency Department Provider Note  ____________________________________________   First MD Initiated Contact with Patient 05/18/20 1834     (approximate)  I have reviewed the triage vital signs and the nursing notes.   HISTORY  Chief Complaint Abdominal Pain    HPI Amy Ray is a 74 y.o. female here for evaluation of abdominal pain  Patient reports that since yesterday she has had a achy feeling over her left side, associated with a bitter taste in her mouth and feels like she is having "GERD"  She tried Maalox but it has not helped.  She said she did have a diagnosis of diverticulitis once, but then was later told by different physician it was not diverticulitis but she reports that it felt different. She just has a burning uncomfortable feeling over the left side of her abdomen. No pain or burning with urination. No vomiting no nausea. No fevers or chills.  She reports right now her symptoms are very very mild, last night it felt more like a bad taste   Past Medical History:  Diagnosis Date  . History of diverticulitis of colon 05/20/2015   per patient report   . Hyperlipidemia   . Hypertension   . Stroke (HCC)   . TIA (transient ischemic attack)   . Vertigo     Patient Active Problem List   Diagnosis Date Noted  . Low back pain 05/20/2015  . Fatigue 05/20/2015  . History of CVA (cerebrovascular accident) 05/20/2015  . Insomnia 05/20/2015  . Morbid obesity (HCC) 05/20/2015  . Positive H. pylori test 05/20/2015  . Tremor 05/20/2015  . Dysfunction of eustachian tube 05/20/2015  . Diverticulosis 05/20/2015  . Essential (primary) hypertension 07/17/2007  . Alterations of sensations, late effect of cerebrovascular disease 03/11/2005  . Hyperlipidemia 10/12/2003  . Trigeminal neuralgia 10/12/2003    Past Surgical History:  Procedure Laterality Date  . CHOLECYSTECTOMY  2000  . HERNIA REPAIR     umbilical hernia as a child   . Pseudophakia OS Left 02/12/2020   Dr. Clydene PughWoodard  . REPLACEMENT TOTAL KNEE Bilateral     Prior to Admission medications   Medication Sig Start Date End Date Taking? Authorizing Provider  Acetaminophen (TYLENOL EX ST ARTHRITIS PAIN PO) Take 2 tablets by mouth daily as needed (for arthritis pain).    [provider]  ALPRAZolam [name](XANAX) 0.5 MG tablet TAKE 1/2 TO 1 TABLET BY  MOUTH TWICE DAILY 12/27/19   Malva LimesFisher, Donald E, MD  alum & mag hydroxide-simeth (MAALOX MAX) 400-400-40 MG/5ML suspension Take 5 mLs by mouth every 6 (six) hours as needed for indigestion. 11/07/19   Nita SickleVeronese, [place], MD  aspirin 81 MG tablet Take 1 tablet by mouth daily.    [provider]  carbamazepine (TEGRETOL) 200 MG tablet TAKE 1 TABLET BY MOUTH TWICE DAILY 06/21/19   Malva LimesFisher, Donald E, MD  clopidogrel (PLAVIX) 75 MG tablet TAKE 1 TABLET BY MOUTH  DAILY 09/10/19   Malva LimesFisher, Donald E, MD  cyanocobalamin 1000 MCG tablet Take 1,000 mcg by mouth daily.    [provider]  diltiazem (DILT-XR) 180 MG 24 hr capsule TAKE 2 CAPSULES BY MOUTH  DAILY 04/26/20   Malva LimesFisher, Donald E, MD  docusate sodium (COLACE) 100 MG capsule Take 100 mg by mouth 2 (two) times daily as needed for mild constipation.    [provider]  famotidine (PEPCID) 40 MG tablet Take 1 tablet (40 mg total) by mouth at bedtime. 12/27/19 12/26/20  Mila MerryFisher, Donald  E, MD  hydrochlorothiazide (HYDRODIURIL) 12.5 MG tablet TAKE 1 TABLET BY MOUTH  DAILY 05/07/20   Malva Limes, MD  IRON PO Take 1 tablet by mouth daily.    [provider]  lidocaine (LIDODERM) 5 % Place 1 patch onto the skin daily. Remove & Discard patch within 12 hours or as directed by MD 02/05/20   Enid Derry, PA-C  nortriptyline (PAMELOR) 10 MG capsule Take 1-2 capsules (10-20 mg total) by mouth at bedtime. 05/12/20   Malva Limes, MD  psyllium (METAMUCIL) 58.6 % packet Take 1 packet by mouth daily.    [provider]  simvastatin (ZOCOR) 20 MG  tablet TAKE 1 TABLET BY MOUTH  DAILY 11/14/19   Malva Limes, MD    Allergies Augmentin [amoxicillin-pot clavulanate] and Quinapril-hydrochlorothiazide  Family History  Problem Relation Age of Onset  . Heart attack Mother   . Hypertension Mother   . Alzheimer's disease Father   . Diabetes Father   . Hypertension Sister   . Diabetes Brother   . Hypertension Sister   . Hypertension Sister   . Hypertension Sister   . Hypertension Sister   . Breast cancer Sister 104  . Breast cancer Maternal Aunt 70  . Breast cancer Maternal Aunt     Social History Social History   Tobacco Use  . Smoking status: Never Smoker  . Smokeless tobacco: Never Used  Vaping Use  . Vaping Use: Never used  Substance Use Topics  . Alcohol use: No    Alcohol/week: 0.0 standard drinks  . Drug use: No    Review of Systems Constitutional: No fever/chills Eyes: No visual changes. ENT: No sore throat. Abnormal taste in her mouth better feeling her acid Cardiovascular: Denies chest pain. Respiratory: Denies shortness of breath. Gastrointestinal: See HPI. Denies any loose stool. No black or bloody stool. No vomiting. Genitourinary: Negative for dysuria. Musculoskeletal: Negative for back pain now but felt achy over her left lower back yesterday. Skin: Negative for rash. Neurological: Negative for headaches, areas of focal weakness or numbness.    ____________________________________________   PHYSICAL EXAM:  VITAL SIGNS: ED Triage Vitals  Enc Vitals Group     BP 05/18/20 1011 138/66     Pulse Rate 05/18/20 1011 83     Resp 05/18/20 1011 20     Temp 05/18/20 1011 98.9 F (37.2 C)     Temp Source 05/18/20 1011 Oral     SpO2 05/18/20 1011 96 %     Weight 05/18/20 1011 210 lb (95.3 kg)     Height 05/18/20 1011 5' (1.524 m)     Head Circumference --      Peak Flow --      Pain Score 05/18/20 1020 4     Pain Loc --      Pain Edu? --      Excl. in GC? --     Constitutional: Alert and  oriented. Well appearing and in no acute distress. Eyes: Conjunctivae are normal. Head: Atraumatic. Nose: No congestion/rhinnorhea. Mouth/Throat: Mucous membranes are moist. Neck: No stridor.  Cardiovascular: Normal rate, regular rhythm. Grossly normal heart sounds.  Good peripheral circulation. Respiratory: Normal respiratory effort.  No retractions. Lungs CTAB. Gastrointestinal: Soft and nontender. No distention. The patient has no pain to palpation on exam. No pain to palpation in any quadrant. No rebound or guarding. Very benign abdominal exam and she reports she is feeling better at this time. No CVA tenderness bilateral Musculoskeletal: No lower extremity  tenderness nor edema. Neurologic:  Normal speech and language. No gross focal neurologic deficits are appreciated.  Skin:  Skin is warm, dry and intact. No rash noted. Psychiatric: Mood and affect are normal. Speech and behavior are normal.  ____________________________________________   LABS (all labs ordered are listed, but only abnormal results are displayed)  Labs Reviewed  COMPREHENSIVE METABOLIC PANEL - Abnormal; Notable for the following components:      Result Value   Creatinine, Ser 1.03 (*)    Total Protein 8.3 (*)    GFR calc non Af Amer 54 (*)    All other components within normal limits  URINALYSIS, COMPLETE (UACMP) WITH MICROSCOPIC - Abnormal; Notable for the following components:   Color, Urine YELLOW (*)    APPearance HAZY (*)    All other components within normal limits  LIPASE, BLOOD  CBC   ____________________________________________  EKG  Reviewed interpreted by me at 1910 Heart rate 70 QRS 90 QTc 460 Normal sinus rhythm, no evidence of acute ischemia ____________________________________________  RADIOLOGY  No results found.   ____________________________________________   PROCEDURES  Procedure(s) performed: None  Procedures  Critical Care performed:  No  ____________________________________________   INITIAL IMPRESSION / ASSESSMENT AND PLAN / ED COURSE  Pertinent labs & imaging results that were available during my care of the patient were reviewed by me and considered in my medical decision making (see chart for details).   Very reassuring exam at this time. Patient reports her symptoms are improved somewhat though. Reports a bitter taste in her mouth, discomfort over the left side but no reproducible tenderness on exam reports she feels better by the time I saw her. Given her very reassuring exam, I discussed further with the patient and I do wish to evaluate urinalysis. Patient taking by mouth will trial antacid type medications. No chest pain or cardiac symptoms no respiratory symptoms. No systemic or infectious symptoms. Patient feeling improved. Doubt acute abdomen or diverticulitis, or other acute left-sided abdominal pathology based on her exam    Clinical Course as of May 18 2253  Wynelle Link May 18, 2020  2123 Reexamined the patient, she is resting quite comfortably, she endorses that she is in have return of some discomfort and pain now located in the left lower quadrant exclusively.  Discussed with the patient, we will proceed with CT imaging to further evaluate in particular evaluate for abnormality such as diverticulitis, kidney stone, colitis etc.   [MQ]    Clinical Course User Index [MQ] Sharyn Creamer, MD   Shortly after discussing obtaining a CT, patient reports that she is decided she feels better again and would like to go home.  She feels improved and her symptoms have gone away.  No longer having pain or discomfort.  Discussed with the patient was shared medical decision making my recommendation to obtain a CT given where her pain was located to evaluate and rule out conditions such as "diverticulitis" or other "infection" or "blockages" or other emergent causes of her pain.  Patient reports that she feels much better she would  like to go home, she is comfortable with the plan to come back if her symptoms return or worsen.  In particular she starts developing fevers, pain comes back, she has severe pain, or new concerns arise she will return to the ER to consider obtaining additional work-up or CT scanning.  This is reasonable, we discussed risks and benefits of CT scan including the very low risks of a scan, but she reports  with her symptoms improved she does not wish to stay any longer and would like to come back if symptoms worsen.  I think this is agreeable.  She has no evidence of peritonitis or acute abdomen by exam, and on reexam is now nontender in all quadrants including left lower  ----------------------------------------- 10:52 PM on 05/18/2020 -----------------------------------------  Return precautions and treatment recommendations and follow-up discussed with the patient who is agreeable with the plan.  ____________________________________________   FINAL CLINICAL IMPRESSION(S) / ED DIAGNOSES  Final diagnoses:  Lower abdominal pain        Note:  This document was prepared using Dragon voice recognition software and may include unintentional dictation errors       Sharyn Creamer, MD 05/18/20 2254

## 2020-05-18 NOTE — Discharge Instructions (Signed)
You were seen in the emergency room for abdominal pain. It is important that you follow up closely with your primary care doctor in the next couple of days. ° ° °Please return to the emergency room right away if you are to develop a fever, severe nausea, your pain becomes severe or worsens, you are unable to keep food down, begin vomiting any dark or bloody fluid, you develop any dark or bloody stools, feel dehydrated, or other new concerns or symptoms arise. ° ° °

## 2020-05-18 NOTE — ED Triage Notes (Signed)
Pt to ED via POV c/o LLQ abd pain that started on Tuesday. Pt states that the pain radiates into her back. Pt is also having some nausea. Pt is in NAD.

## 2020-05-18 NOTE — ED Notes (Addendum)
RN reviewed discharge instructions, follow-up care, and prescriptions with patient. Patient verbalized understanding of all information reviewed. Patient stable, with no distress noted at this time.

## 2020-05-18 NOTE — ED Notes (Signed)
Pt states she does not want to have the CT scan that was ordered; she wants to go home.  Dr. Fanny Bien has been made aware.

## 2020-05-19 ENCOUNTER — Encounter (INDEPENDENT_AMBULATORY_CARE_PROVIDER_SITE_OTHER): Payer: Self-pay | Admitting: Ophthalmology

## 2020-05-30 ENCOUNTER — Ambulatory Visit: Payer: Medicare Other | Admitting: Physician Assistant

## 2020-06-05 ENCOUNTER — Telehealth: Payer: Self-pay | Admitting: Family Medicine

## 2020-06-05 NOTE — Telephone Encounter (Signed)
Copied from CRM [id number]. Topic: Medicare AWV >> Jun 05, 2020 10:51 AM Claudette Laws R wrote: Reason for CRM: 06/05/20 Left message - need to reschedule AWVS on 9/16 to another day due to [facility] out of the office- srs

## 2020-06-09 ENCOUNTER — Encounter: Payer: Self-pay | Admitting: Emergency Medicine

## 2020-06-09 ENCOUNTER — Other Ambulatory Visit: Payer: Self-pay

## 2020-06-09 DIAGNOSIS — Z79899 Other long term (current) drug therapy: Secondary | ICD-10-CM | POA: Insufficient documentation

## 2020-06-09 DIAGNOSIS — E869 Volume depletion, unspecified: Secondary | ICD-10-CM | POA: Diagnosis not present

## 2020-06-09 DIAGNOSIS — I10 Essential (primary) hypertension: Secondary | ICD-10-CM | POA: Diagnosis not present

## 2020-06-09 DIAGNOSIS — R531 Weakness: Secondary | ICD-10-CM | POA: Diagnosis not present

## 2020-06-09 DIAGNOSIS — R5383 Other fatigue: Secondary | ICD-10-CM | POA: Diagnosis not present

## 2020-06-09 LAB — BASIC METABOLIC PANEL
Anion gap: 14 (ref 5–15)
BUN: 38 mg/dL — ABNORMAL HIGH (ref 8–23)
CO2: 21 mmol/L — ABNORMAL LOW (ref 22–32)
Calcium: 8.8 mg/dL — ABNORMAL LOW (ref 8.9–10.3)
Chloride: 101 mmol/L (ref 98–111)
Creatinine, Ser: 1.45 mg/dL — ABNORMAL HIGH (ref 0.44–1.00)
GFR calc Af Amer: 41 mL/min — ABNORMAL LOW (ref >60)
GFR calc non Af Amer: 36 mL/min — ABNORMAL LOW (ref >60)
Glucose, Bld: 98 mg/dL (ref 70–99)
Potassium: 3.3 mmol/L — ABNORMAL LOW (ref 3.5–5.1)
Sodium: 136 mmol/L (ref 135–145)

## 2020-06-09 LAB — URINALYSIS, COMPLETE (UACMP) WITH MICROSCOPIC
Bacteria, UA: NONE SEEN
Bilirubin Urine: NEGATIVE
Glucose, UA: NEGATIVE mg/dL
Hgb urine dipstick: NEGATIVE
Ketones, ur: 5 mg/dL — AB
Leukocytes,Ua: NEGATIVE
Nitrite: NEGATIVE
Protein, ur: NEGATIVE mg/dL
Specific Gravity, Urine: 1.015 (ref 1.005–1.030)
pH: 5 (ref 5.0–8.0)

## 2020-06-09 LAB — CBC
HCT: 40.8 % (ref 36.0–46.0)
Hemoglobin: 13.8 g/dL (ref 12.0–15.0)
MCH: 33.4 pg (ref 26.0–34.0)
MCHC: 33.8 g/dL (ref 30.0–36.0)
MCV: 98.8 fL (ref 80.0–100.0)
Platelets: 192 10*3/uL (ref 150–400)
RBC: 4.13 MIL/uL (ref 3.87–5.11)
RDW: 11.9 % (ref 11.5–15.5)
WBC: 6.6 10*3/uL (ref 4.0–10.5)
nRBC: 0 % (ref 0.0–0.2)

## 2020-06-09 NOTE — ED Triage Notes (Signed)
Pt from home with weakness and back/neck pain since Saturday. She also reports that her mouth is "real, real bitter." Pt states she felt normal on Friday, but began to feel "real bad" on Saturday after going to [place]. Pt alert & oriented.

## 2020-06-10 ENCOUNTER — Emergency Department
Admission: EM | Admit: 2020-06-10 | Discharge: 2020-06-10 | Disposition: A | Discharge status: Home / Self Care | Payer: Medicare Other | Attending: Emergency Medicine | Admitting: Emergency Medicine

## 2020-06-10 ENCOUNTER — Encounter (INDEPENDENT_AMBULATORY_CARE_PROVIDER_SITE_OTHER): Payer: Medicare Other | Admitting: Ophthalmology

## 2020-06-10 DIAGNOSIS — R5383 Other fatigue: Secondary | ICD-10-CM

## 2020-06-10 DIAGNOSIS — R531 Weakness: Secondary | ICD-10-CM

## 2020-06-10 DIAGNOSIS — E869 Volume depletion, unspecified: Secondary | ICD-10-CM

## 2020-06-10 NOTE — Discharge Instructions (Addendum)
Your workup in the Emergency Department today was reassuring.  We did not find any specific abnormalities.  We recommend you drink plenty of fluids, take your regular medications and/or any new ones prescribed today, and follow up with the doctor(s) listed in these documents as recommended.  Your labs suggest that you may be a little bit dehydrated, so I encourage her to drink plenty of fluids and read through the included information about rehydration and weakness and fatigue in general.  Return to the Emergency Department if you develop new or worsening symptoms that concern you.

## 2020-06-10 NOTE — ED Provider Notes (Signed)
[facility] Emergency Department Provider Note  ____________________________________________   First MD Initiated Contact with Patient 06/10/20 [id number]     (approximate)  I have reviewed the triage vital signs and the nursing notes.   HISTORY  Chief Complaint Weakness    HPI Amy Ray is a 74 y.o. female with medical history as listed below who presents with her adult daughter for evaluation of fatigue and generalized weakness.  She said that she has ongoing issues with acid reflux and a better taste in her mouth.  That has not changed, but what brought her in is that she has felt very fatigued and weak after going to [employer] 2 to 3 days ago.  She said that when she gets up to do something, she feels like she just cannot get up and do it and has to lay back down to rest.  She feels better after some rest but then has a recurrence of the episodes.  The weakness is nonfocal; she feels just weak overall without it being specific to any extremity or 1 side of her body.  She denies headache, visual changes, chest pain, shortness of breath, nausea, and vomiting.  The bitter taste in her mouth is persistent and she always has a little bit of acid reflux.  She always has some mild pain in her left lower quadrant for which she was seen about a month ago and that has not changed but she is not worried about it tonight.  She has had no dysuria.  Nothing particular makes her symptoms better or worse.         Past Medical History:  Diagnosis Date  . History of diverticulitis of colon 05/20/2015   per patient report   . Hyperlipidemia   . Hypertension   . Stroke (HCC)   . TIA (transient ischemic attack)   . Vertigo     Patient Active Problem List   Diagnosis Date Noted  . Low back pain 05/20/2015  . Fatigue 05/20/2015  . History of CVA (cerebrovascular accident) 05/20/2015  . Insomnia 05/20/2015  . Morbid obesity (HCC) 05/20/2015  . Positive H. pylori test  05/20/2015  . Tremor 05/20/2015  . Dysfunction of eustachian tube 05/20/2015  . Diverticulosis 05/20/2015  . Essential (primary) hypertension 07/17/2007  . Alterations of sensations, late effect of cerebrovascular disease 03/11/2005  . Hyperlipidemia 10/12/2003  . Trigeminal neuralgia 10/12/2003    Past Surgical History:  Procedure Laterality Date  . CHOLECYSTECTOMY  2000  . HERNIA REPAIR     umbilical hernia as a child  . Pseudophakia OS Left 02/12/2020   Dr. Clydene Pugh  . REPLACEMENT TOTAL KNEE Bilateral     Prior to Admission medications   Medication Sig Start Date End Date Taking? Authorizing Provider  Acetaminophen (TYLENOL EX ST ARTHRITIS PAIN PO) Take 2 tablets by mouth daily as needed (for arthritis pain).    [provider]  ALPRAZolam [name]) 0.5 MG tablet TAKE 1/2 TO 1 TABLET BY  MOUTH TWICE DAILY 12/27/19   Malva Limes, MD  alum & mag hydroxide-simeth (MAALOX MAX) 400-400-40 MG/5ML suspension Take 5 mLs by mouth every 6 (six) hours as needed for indigestion. 11/07/19   Nita Sickle, MD  aspirin 81 MG tablet Take 1 tablet by mouth daily.    [provider]  carbamazepine (TEGRETOL) 200 MG tablet TAKE 1 TABLET BY MOUTH TWICE DAILY 06/21/19   Malva Limes, MD  clopidogrel (PLAVIX) 75 MG tablet TAKE 1 TABLET BY MOUTH  DAILY 09/10/19   Malva LimesFisher, Donald E, MD  cyanocobalamin 1000 MCG tablet Take 1,000 mcg by mouth daily.    [provider]  diltiazem (DILT-XR) 180 MG 24 hr capsule TAKE 2 CAPSULES BY MOUTH  DAILY 04/26/20   Malva LimesFisher, Donald E, MD  docusate sodium (COLACE) 100 MG capsule Take 100 mg by mouth 2 (two) times daily as needed for mild constipation.    [provider]  famotidine (PEPCID) 40 MG tablet Take 1 tablet (40 mg total) by mouth at bedtime. 12/27/19 12/26/20  Malva LimesFisher, Donald E, MD  hydrochlorothiazide (HYDRODIURIL) 12.5 MG tablet TAKE 1 TABLET BY MOUTH  DAILY 05/07/20   Malva LimesFisher, Donald E, MD  IRON PO Take 1 tablet by mouth  daily.    [provider]  lidocaine (LIDODERM) 5 % Place 1 patch onto the skin daily. Remove & Discard patch within 12 hours or as directed by MD 02/05/20   Enid DerryWagner, Ashley, PA-C  nortriptyline (PAMELOR) 10 MG capsule Take 1-2 capsules (10-20 mg total) by mouth at bedtime. 05/12/20   Malva LimesFisher, Donald E, MD  psyllium (METAMUCIL) 58.6 % packet Take 1 packet by mouth daily.    [provider]  simvastatin (ZOCOR) 20 MG tablet TAKE 1 TABLET BY MOUTH  DAILY 11/14/19   Malva LimesFisher, Donald E, MD    Allergies Augmentin [amoxicillin-pot clavulanate] and Quinapril-hydrochlorothiazide  Family History  Problem Relation Age of Onset  . Heart attack Mother   . Hypertension Mother   . Alzheimer's disease Father   . Diabetes Father   . Hypertension Sister   . Diabetes Brother   . Hypertension Sister   . Hypertension Sister   . Hypertension Sister   . Hypertension Sister   . Breast cancer Sister [id number]  . Breast cancer Maternal Aunt 70  . Breast cancer Maternal Aunt     Social History Social History   Tobacco Use  . Smoking status: Never Smoker  . Smokeless tobacco: Never Used  Vaping Use  . Vaping Use: Never used  Substance Use Topics  . Alcohol use: No    Alcohol/week: 0.0 standard drinks  . Drug use: No    Review of Systems Constitutional: No fever/chills.  Generalized weakness, malaise and fatigue. Eyes: No visual changes. ENT: No sore throat. Cardiovascular: Denies chest pain. Respiratory: Denies shortness of breath. Gastrointestinal: Chronic acid reflux.  No nausea, no vomiting.  No diarrhea.  No constipation. Genitourinary: Negative for dysuria. Musculoskeletal: Negative for neck pain.  Negative for back pain. Integumentary: Negative for rash. Neurological: Generalized weakness and feeling like she cannot move in multiple episodes for the last couple of days.  Negative for headaches, focal weakness or numbness.   ____________________________________________   PHYSICAL  EXAM:  VITAL SIGNS: ED Triage Vitals  Enc Vitals Group     BP 06/09/20 1554 129/61     Pulse Rate 06/09/20 1554 78     Resp 06/09/20 1554 20     Temp 06/09/20 1554 (!) 97.4 F (36.3 C)     Temp Source 06/09/20 1554 Oral     SpO2 06/09/20 1554 99 %     Weight 06/09/20 1557 93.9 kg (207 lb)     Height 06/09/20 1557 1.499 m (4\' 11" )     Head Circumference --      Peak Flow --      Pain Score 06/09/20 1556 6     Pain Loc --      Pain Edu? --      Excl. in  GC? --     Constitutional: Alert and oriented.  Generally well-appearing and in no distress. Eyes: Conjunctivae are normal.  Head: Atraumatic. Nose: No congestion/rhinnorhea. Mouth/Throat: Patient is wearing a mask. Neck: No stridor.  No meningeal signs; she has no pain or tenderness with flexion, extension, and rotation of her head and neck side to side. Cardiovascular: Normal rate, regular rhythm. Good peripheral circulation. Grossly normal heart sounds. Respiratory: Normal respiratory effort.  No retractions. Gastrointestinal: Soft and nontender. No distention.  Musculoskeletal: No lower extremity tenderness nor edema. No gross deformities of extremities. Neurologic:  Normal speech and language. No gross focal neurologic deficits are appreciated.  She has good grip strength and upper body strength that is equal bilaterally.  She is able to flex and extend her thighs and dorsiflex and plantarflex her feet with good and equal strength. Skin:  Skin is warm, dry and intact. Psychiatric: Mood and affect are normal. Speech and behavior are normal.  ____________________________________________   LABS (all labs ordered are listed, but only abnormal results are displayed)  Labs Reviewed  BASIC METABOLIC PANEL - Abnormal; Notable for the following components:      Result Value   Potassium 3.3 (*)    CO2 21 (*)    BUN 38 (*)    Creatinine, Ser 1.45 (*)    Calcium 8.8 (*)    GFR calc non Af Amer 36 (*)    GFR calc Af Amer 41 (*)     All other components within normal limits  URINALYSIS, COMPLETE (UACMP) WITH MICROSCOPIC - Abnormal; Notable for the following components:   Color, Urine YELLOW (*)    APPearance HAZY (*)    Ketones, ur 5 (*)    All other components within normal limits  CBC  CBG MONITORING, ED   ____________________________________________  EKG  ED ECG REPORT I, Loleta Rose, the attending physician, personally viewed and interpreted this ECG.  Date: 06/09/2020 EKG Time: 16: 00 Rate: 78 Rhythm: normal sinus rhythm QRS Axis: normal Intervals: normal ST/T Wave abnormalities: Non-specific ST segment / T-wave changes, but no clear evidence of acute ischemia. Narrative Interpretation: no definitive evidence of acute ischemia; does not meet STEMI criteria.   ____________________________________________  RADIOLOGY I, Loleta Rose, personally viewed and evaluated these images (plain radiographs) as part of my medical decision making, as well as reviewing the written report by the radiologist.  ED MD interpretation: No indication for emergent imaging  Official radiology report(s): No results found.  ____________________________________________   PROCEDURES   Procedure(s) performed (including Critical Care):  Procedures   ____________________________________________   INITIAL IMPRESSION / MDM / ASSESSMENT AND PLAN / ED COURSE  As part of my medical decision making, I reviewed the following data within the electronic MEDICAL RECORD NUMBER History obtained from family, Nursing notes reviewed and incorporated, Labs reviewed , EKG interpreted , Old chart reviewed, Notes from prior ED visits and [place] Controlled Substance Database   Differential diagnosis includes, but is not limited to, nonspecific fatigue and malaise, viral illness, electrolyte or metabolic abnormality, acute kidney injury or dehydration.   The patient is well-appearing and in no distress.  She has been waiting for 12 hours  in the emergency department waiting room due to overwhelming ED and hospital patient volumes and her vital signs remained stable.  She is not having any of the symptoms or "episodes" that she is describing where she feels like she cannot move and needs to lie back down to rest.  Her lab work is  generally reassuring.  She has a slight elevation in her BUN and her creatinine.  The creatinine is about 1.4 and her baseline seems to be around 1.2.  This may be indicative of mild volume depletion and could be at least a partial explanation for her symptoms.  I asked her if she has been eating and drinking normally and she said that she does not usually drink as much as usual when the air conditioning has been on, which has been recently.  I provided encouragement for her and discussed it with her daughter at bedside.  I explained that she needs to eat and drink more and rest and that there is no evidence of an emergent medical condition tonight.  She should be able to schedule a follow-up appointment with her primary care provider.  We discussed the possibility of IV fluids but since she is not having any nausea or vomiting or diarrhea and can tolerate oral fluids, we all agreed that it would be best for her to rehydrate at home.  I gave my usual and customary return precautions.           ____________________________________________  FINAL CLINICAL IMPRESSION(S) / ED DIAGNOSES  Final diagnoses:  Fatigue, unspecified type  Generalized weakness  Volume depletion     MEDICATIONS GIVEN DURING THIS VISIT:  Medications - No data to display   ED Discharge Orders    None      *Please note:  Amy Ray was evaluated in Emergency Department on 06/10/2020 for the symptoms described in the history of present illness. She was evaluated in the context of the global COVID-19 pandemic, which necessitated consideration that the patient might be at risk for infection with the SARS-CoV-2 virus that causes  COVID-19. Institutional protocols and algorithms that pertain to the evaluation of patients at risk for COVID-19 are in a state of rapid change based on information released by regulatory bodies including the CDC and federal and state organizations. These policies and algorithms were followed during the patient's care in the ED.  Some ED evaluations and interventions may be delayed as a result of limited staffing during and after the pandemic.*  Note:  This document was prepared using Dragon voice recognition software and may include unintentional dictation errors.   Loleta Rose, MD 06/10/20 [phone]

## 2020-06-11 DIAGNOSIS — R4781 Slurred speech: Secondary | ICD-10-CM | POA: Diagnosis not present

## 2020-06-11 DIAGNOSIS — N189 Chronic kidney disease, unspecified: Secondary | ICD-10-CM | POA: Diagnosis not present

## 2020-06-11 DIAGNOSIS — R531 Weakness: Secondary | ICD-10-CM | POA: Diagnosis not present

## 2020-06-11 DIAGNOSIS — N390 Urinary tract infection, site not specified: Secondary | ICD-10-CM | POA: Diagnosis not present

## 2020-06-11 DIAGNOSIS — E871 Hypo-osmolality and hyponatremia: Secondary | ICD-10-CM | POA: Diagnosis not present

## 2020-06-11 DIAGNOSIS — R5383 Other fatigue: Secondary | ICD-10-CM | POA: Diagnosis not present

## 2020-06-11 DIAGNOSIS — N271 Small kidney, bilateral: Secondary | ICD-10-CM | POA: Diagnosis not present

## 2020-06-11 DIAGNOSIS — I129 Hypertensive chronic kidney disease with stage 1 through stage 4 chronic kidney disease, or unspecified chronic kidney disease: Secondary | ICD-10-CM | POA: Diagnosis not present

## 2020-06-11 DIAGNOSIS — Z79899 Other long term (current) drug therapy: Secondary | ICD-10-CM | POA: Diagnosis not present

## 2020-06-11 DIAGNOSIS — Z96612 Presence of left artificial shoulder joint: Secondary | ICD-10-CM | POA: Diagnosis not present

## 2020-06-11 DIAGNOSIS — G500 Trigeminal neuralgia: Secondary | ICD-10-CM | POA: Diagnosis not present

## 2020-06-11 DIAGNOSIS — K219 Gastro-esophageal reflux disease without esophagitis: Secondary | ICD-10-CM | POA: Diagnosis not present

## 2020-06-11 DIAGNOSIS — E876 Hypokalemia: Secondary | ICD-10-CM | POA: Diagnosis not present

## 2020-06-11 DIAGNOSIS — N179 Acute kidney failure, unspecified: Secondary | ICD-10-CM | POA: Diagnosis not present

## 2020-06-11 DIAGNOSIS — N3000 Acute cystitis without hematuria: Secondary | ICD-10-CM | POA: Diagnosis not present

## 2020-06-11 DIAGNOSIS — Z8673 Personal history of transient ischemic attack (TIA), and cerebral infarction without residual deficits: Secondary | ICD-10-CM | POA: Diagnosis not present

## 2020-06-11 DIAGNOSIS — E785 Hyperlipidemia, unspecified: Secondary | ICD-10-CM | POA: Diagnosis not present

## 2020-06-11 DIAGNOSIS — Z66 Do not resuscitate: Secondary | ICD-10-CM | POA: Diagnosis not present

## 2020-06-11 DIAGNOSIS — Z96653 Presence of artificial knee joint, bilateral: Secondary | ICD-10-CM | POA: Diagnosis not present

## 2020-06-12 DIAGNOSIS — E876 Hypokalemia: Secondary | ICD-10-CM | POA: Diagnosis not present

## 2020-06-12 DIAGNOSIS — R531 Weakness: Secondary | ICD-10-CM | POA: Diagnosis not present

## 2020-06-12 DIAGNOSIS — E871 Hypo-osmolality and hyponatremia: Secondary | ICD-10-CM | POA: Diagnosis not present

## 2020-06-12 DIAGNOSIS — N179 Acute kidney failure, unspecified: Secondary | ICD-10-CM | POA: Diagnosis not present

## 2020-06-13 DIAGNOSIS — N179 Acute kidney failure, unspecified: Secondary | ICD-10-CM | POA: Diagnosis not present

## 2020-06-13 DIAGNOSIS — E876 Hypokalemia: Secondary | ICD-10-CM | POA: Diagnosis not present

## 2020-06-13 DIAGNOSIS — R531 Weakness: Secondary | ICD-10-CM | POA: Diagnosis not present

## 2020-06-17 ENCOUNTER — Other Ambulatory Visit: Payer: Self-pay

## 2020-06-17 ENCOUNTER — Encounter: Payer: Self-pay | Admitting: Emergency Medicine

## 2020-06-17 DIAGNOSIS — Z7982 Long term (current) use of aspirin: Secondary | ICD-10-CM | POA: Diagnosis not present

## 2020-06-17 DIAGNOSIS — R202 Paresthesia of skin: Secondary | ICD-10-CM | POA: Diagnosis not present

## 2020-06-17 DIAGNOSIS — Z79899 Other long term (current) drug therapy: Secondary | ICD-10-CM | POA: Diagnosis not present

## 2020-06-17 DIAGNOSIS — I10 Essential (primary) hypertension: Secondary | ICD-10-CM | POA: Diagnosis not present

## 2020-06-17 DIAGNOSIS — R6889 Other general symptoms and signs: Secondary | ICD-10-CM | POA: Diagnosis not present

## 2020-06-17 DIAGNOSIS — Z743 Need for continuous supervision: Secondary | ICD-10-CM | POA: Diagnosis not present

## 2020-06-17 LAB — COMPREHENSIVE METABOLIC PANEL
ALT: 24 U/L (ref 0–44)
AST: 27 U/L (ref 15–41)
Albumin: 4.1 g/dL (ref 3.5–5.0)
Alkaline Phosphatase: 78 U/L (ref 38–126)
Anion gap: 10 (ref 5–15)
BUN: 17 mg/dL (ref 8–23)
CO2: 26 mmol/L (ref 22–32)
Calcium: 9.3 mg/dL (ref 8.9–10.3)
Chloride: 101 mmol/L (ref 98–111)
Creatinine, Ser: 1.11 mg/dL — ABNORMAL HIGH (ref 0.44–1.00)
GFR calc Af Amer: 57 mL/min — ABNORMAL LOW (ref >60)
GFR calc non Af Amer: 49 mL/min — ABNORMAL LOW (ref >60)
Glucose, Bld: 96 mg/dL (ref 70–99)
Potassium: 4.2 mmol/L (ref 3.5–5.1)
Sodium: 137 mmol/L (ref 135–145)
Total Bilirubin: 0.6 mg/dL (ref 0.3–1.2)
Total Protein: 7.7 g/dL (ref 6.5–8.1)

## 2020-06-17 LAB — CBC WITH DIFFERENTIAL/PLATELET
Abs Immature Granulocytes: 0.02 10*3/uL (ref 0.00–0.07)
Basophils Absolute: 0 10*3/uL (ref 0.0–0.1)
Basophils Relative: 1 %
Eosinophils Absolute: 0.1 10*3/uL (ref 0.0–0.5)
Eosinophils Relative: 2 %
HCT: 32.3 % — ABNORMAL LOW (ref 36.0–46.0)
Hemoglobin: 11.4 g/dL — ABNORMAL LOW (ref 12.0–15.0)
Immature Granulocytes: 0 %
Lymphocytes Relative: 28 %
Lymphs Abs: 1.8 10*3/uL (ref 0.7–4.0)
MCH: 34.3 pg — ABNORMAL HIGH (ref 26.0–34.0)
MCHC: 35.3 g/dL (ref 30.0–36.0)
MCV: 97.3 fL (ref 80.0–100.0)
Monocytes Absolute: 0.7 10*3/uL (ref 0.1–1.0)
Monocytes Relative: 11 %
Neutro Abs: 3.7 10*3/uL (ref 1.7–7.7)
Neutrophils Relative %: 58 %
Platelets: 219 10*3/uL (ref 150–400)
RBC: 3.32 MIL/uL — ABNORMAL LOW (ref 3.87–5.11)
RDW: 12.3 % (ref 11.5–15.5)
WBC: 6.3 10*3/uL (ref 4.0–10.5)
nRBC: 0 % (ref 0.0–0.2)

## 2020-06-17 NOTE — ED Triage Notes (Signed)
Pt to triage via w/c with no distress noted; pt d/c from [facility] on Friday for hypokalemia; pt reports "tingling" to both legs since this morning; abnodenies any pain or accomp symptoms

## 2020-06-18 ENCOUNTER — Emergency Department
Admission: EM | Admit: 2020-06-18 | Discharge: 2020-06-18 | Disposition: A | Discharge status: Home / Self Care | Payer: Medicare Other | Attending: Emergency Medicine | Admitting: Emergency Medicine

## 2020-06-18 ENCOUNTER — Ambulatory Visit: Payer: Medicare Other | Admitting: Family Medicine

## 2020-06-18 DIAGNOSIS — R202 Paresthesia of skin: Secondary | ICD-10-CM

## 2020-06-18 NOTE — Discharge Instructions (Signed)
Your lab tests and exam today were unremarkable. Please follow up with primary care and orthopedics to further evaluate for a possible spinal cause of your symptoms.

## 2020-06-18 NOTE — ED Provider Notes (Signed)
[facility] Emergency Department Provider Note  ____________________________________________  Time seen: Approximately 1:07 PM  I have reviewed the triage vital signs and the nursing notes.   HISTORY  Chief Complaint Leg paresthesia   HPI Bevelyn Arriola is a 74 y.o. female with h/o htn, hl, tia who comes to the ED complaining of tingling in both legs, which started yesterday.  Intermittent, worse with standing and walking, better with sitting down and resting.  Denies pain.  No chest pain shortness of breath.  No loss of power.  No change in balance or coordination or falls.  She recently was being treated at [facility] for hypokalemia, reviewed labs in EMR which showed a potassium level of 3.0 which improved to normal afterward.  Denies any vomiting or diarrhea, eating and drinking normally, compliant with medications.  She notes that she was recently discontinued from her statin and her diuretic due to the hypokalemia.  Patient notes that during the long wait time in the waiting room, she has noticed improvement of her symptoms, and can now ambulate without any recurrent symptoms.  She now feels back to normal.   Past Medical History:  Diagnosis Date  . History of diverticulitis of colon 05/20/2015   per patient report   . Hyperlipidemia   . Hypertension   . Stroke (HCC)   . TIA (transient ischemic attack)   . Vertigo      Patient Active Problem List   Diagnosis Date Noted  . Low back pain 05/20/2015  . Fatigue 05/20/2015  . History of CVA (cerebrovascular accident) 05/20/2015  . Insomnia 05/20/2015  . Morbid obesity (HCC) 05/20/2015  . Positive H. pylori test 05/20/2015  . Tremor 05/20/2015  . Dysfunction of eustachian tube 05/20/2015  . Diverticulosis 05/20/2015  . Essential (primary) hypertension 07/17/2007  . Alterations of sensations, late effect of cerebrovascular disease 03/11/2005  . Hyperlipidemia 10/12/2003  . Trigeminal  neuralgia 10/12/2003     Past Surgical History:  Procedure Laterality Date  . CHOLECYSTECTOMY  2000  . HERNIA REPAIR     umbilical hernia as a child  . Pseudophakia OS Left 02/12/2020   Dr. Clydene Pugh  . REPLACEMENT TOTAL KNEE Bilateral      Prior to Admission medications   Medication Sig Start Date End Date Taking? Authorizing Provider  Acetaminophen (TYLENOL EX ST ARTHRITIS PAIN PO) Take 2 tablets by mouth daily as needed (for arthritis pain).    [provider]  ALPRAZolam [name]) 0.5 MG tablet TAKE 1/2 TO 1 TABLET BY  MOUTH TWICE DAILY 12/27/19   Malva Limes, MD  alum & mag hydroxide-simeth (MAALOX MAX) 400-400-40 MG/5ML suspension Take 5 mLs by mouth every 6 (six) hours as needed for indigestion. 11/07/19   Nita Sickle, MD  aspirin 81 MG tablet Take 1 tablet by mouth daily.    [provider]  carbamazepine (TEGRETOL) 200 MG tablet TAKE 1 TABLET BY MOUTH TWICE DAILY 06/21/19   Malva Limes, MD  clopidogrel (PLAVIX) 75 MG tablet TAKE 1 TABLET BY MOUTH  DAILY 09/10/19   Malva Limes, MD  cyanocobalamin 1000 MCG tablet Take 1,000 mcg by mouth daily.    [provider]  diltiazem (DILT-XR) 180 MG 24 hr capsule TAKE 2 CAPSULES BY MOUTH  DAILY 04/26/20   Malva Limes, MD  docusate sodium (COLACE) 100 MG capsule Take 100 mg by mouth 2 (two) times daily as needed for mild constipation.    [provider]  famotidine (PEPCID)  40 MG tablet Take 1 tablet (40 mg total) by mouth at bedtime. 12/27/19 12/26/20  Malva Limes, MD  hydrochlorothiazide (HYDRODIURIL) 12.5 MG tablet TAKE 1 TABLET BY MOUTH  DAILY 05/07/20   Malva Limes, MD  IRON PO Take 1 tablet by mouth daily.    [provider]  lidocaine (LIDODERM) 5 % Place 1 patch onto the skin daily. Remove & Discard patch within 12 hours or as directed by MD 02/05/20   Enid Derry, PA-C  nortriptyline (PAMELOR) 10 MG capsule Take 1-2 capsules (10-20 mg total) by mouth at  bedtime. 05/12/20   Malva Limes, MD  psyllium (METAMUCIL) 58.6 % packet Take 1 packet by mouth daily.    [provider]  simvastatin (ZOCOR) 20 MG tablet TAKE 1 TABLET BY MOUTH  DAILY 11/14/19   Malva Limes, MD     Allergies Augmentin [amoxicillin-pot clavulanate] and Quinapril-hydrochlorothiazide   Family History  Problem Relation Age of Onset  . Heart attack Mother   . Hypertension Mother   . Alzheimer's disease Father   . Diabetes Father   . Hypertension Sister   . Diabetes Brother   . Hypertension Sister   . Hypertension Sister   . Hypertension Sister   . Hypertension Sister   . Breast cancer Sister 79  . Breast cancer Maternal Aunt 70  . Breast cancer Maternal Aunt     Social History Social History   Tobacco Use  . Smoking status: Never Smoker  . Smokeless tobacco: Never Used  Vaping Use  . Vaping Use: Never used  Substance Use Topics  . Alcohol use: No    Alcohol/week: 0.0 standard drinks  . Drug use: No    Review of Systems  Constitutional:   No fever or chills.  ENT:   No sore throat. No rhinorrhea. Cardiovascular:   No chest pain or syncope. Respiratory:   No dyspnea or cough. Gastrointestinal:   Negative for abdominal pain, vomiting and diarrhea.  Musculoskeletal:   Negative for focal pain or swelling All other systems reviewed and are negative except as documented above in ROS and HPI.  ____________________________________________   PHYSICAL EXAM:  VITAL SIGNS: ED Triage Vitals [06/17/20 2110]  Enc Vitals Group     BP (!) 141/67     Pulse Rate 86     Resp 18     Temp 98.7 F (37.1 C)     Temp Source Oral     SpO2 100 %     Weight 207 lb (93.9 kg)     Height 4\' 11"  (1.499 m)     Head Circumference      Peak Flow      Pain Score 0     Pain Loc      Pain Edu?      Excl. in GC?     Vital signs reviewed, nursing assessments reviewed.   Constitutional:   Alert and oriented. Non-toxic appearance. Eyes:   Conjunctivae  are normal. EOMI. PERRL. ENT      Head:   Normocephalic and atraumatic.      Nose:   Wearing a mask.      Mouth/Throat:   Wearing a mask.      Neck:   No meningismus. Full ROM. Hematological/Lymphatic/Immunilogical:   No cervical lymphadenopathy. Cardiovascular:   RRR. Symmetric bilateral radial and DP pulses.  No murmurs. Cap refill less than 2 seconds. Respiratory:   Normal respiratory effort without tachypnea/retractions. Breath sounds are clear and equal  bilaterally. No wheezes/rales/rhonchi. Gastrointestinal:   Soft and nontender. Non distended. There is no CVA tenderness.  No rebound, rigidity, or guarding.  Musculoskeletal:   Normal range of motion in all extremities. No joint effusions.  No lower extremity tenderness.  No edema.  Symmetric calf circumference. Neurologic:   Normal speech and language.  Motor grossly intact. No acute focal neurologic deficits are appreciated.  Skin:    Skin is warm, dry and intact. No rash noted.  No petechiae, purpura, or bullae.  ____________________________________________    LABS (pertinent positives/negatives) (all labs ordered are listed, but only abnormal results are displayed) Labs Reviewed  CBC WITH DIFFERENTIAL/PLATELET - Abnormal; Notable for the following components:      Result Value   RBC 3.32 (*)    Hemoglobin 11.4 (*)    HCT 32.3 (*)    MCH 34.3 (*)    All other components within normal limits  COMPREHENSIVE METABOLIC PANEL - Abnormal; Notable for the following components:   Creatinine, Ser 1.11 (*)    GFR calc non Af Amer 49 (*)    GFR calc Af Amer 57 (*)    All other components within normal limits   ____________________________________________   EKG    ____________________________________________    RADIOLOGY  No results found.  ____________________________________________   PROCEDURES Procedures  ____________________________________________    CLINICAL IMPRESSION / ASSESSMENT AND PLAN / ED  COURSE  Medications ordered in the ED: Medications - No data to display  Pertinent labs & imaging results that were available during my care of the patient were reviewed by me and considered in my medical decision making (see chart for details).  Jessicalynn Deshong was evaluated in Emergency Department on 06/18/2020 for the symptoms described in the history of present illness. She was evaluated in the context of the global COVID-19 pandemic, which necessitated consideration that the patient might be at risk for infection with the SARS-CoV-2 virus that causes COVID-19. Institutional protocols and algorithms that pertain to the evaluation of patients at risk for COVID-19 are in a state of rapid change based on information released by regulatory bodies including the CDC and federal and state organizations. These policies and algorithms were followed during the patient's care in the ED.   Patient comes the ED complaining of intermittent paresthesias in both legs which is symmetric.  Doubt stroke, embolic phenomenon, DVT, cellulitis.  Labs are unremarkable, without evidence of electrolyte abnormality or profound hyperglycemia.  She does not appear to be dehydrated.  She has discontinued her Lasix and her statin which could cause similar symptoms.  With reassuring exam, no acute symptoms at this time, I wonder if her symptoms could be related to spinal stenosis or other subacute spinal pathology.  I will refer her to primary care and orthopedics for further evaluation.  Doubt cauda equina syndrome or acute central cord syndrome at this time      ____________________________________________   FINAL CLINICAL IMPRESSION(S) / ED DIAGNOSES    Final diagnoses:  Paresthesia of both lower extremities     ED Discharge Orders    None      Portions of this note were generated with dragon dictation software. Dictation errors may occur despite best attempts at proofreading.   Sharman Cheek, MD 06/18/20  1314

## 2020-06-27 ENCOUNTER — Other Ambulatory Visit: Payer: Self-pay

## 2020-06-27 ENCOUNTER — Ambulatory Visit (INDEPENDENT_AMBULATORY_CARE_PROVIDER_SITE_OTHER): Payer: Medicare Other | Admitting: Family Medicine

## 2020-06-27 ENCOUNTER — Encounter: Payer: Self-pay | Admitting: Family Medicine

## 2020-06-27 VITALS — BP 135/66 | HR 95 | Temp 98.6°F | Resp 18 | Ht 59.0 in | Wt 220.0 lb

## 2020-06-27 DIAGNOSIS — I10 Essential (primary) hypertension: Secondary | ICD-10-CM | POA: Diagnosis not present

## 2020-06-27 DIAGNOSIS — Z23 Encounter for immunization: Secondary | ICD-10-CM

## 2020-06-27 DIAGNOSIS — E876 Hypokalemia: Secondary | ICD-10-CM | POA: Diagnosis not present

## 2020-06-27 DIAGNOSIS — Z8673 Personal history of transient ischemic attack (TIA), and cerebral infarction without residual deficits: Secondary | ICD-10-CM | POA: Diagnosis not present

## 2020-06-27 DIAGNOSIS — E785 Hyperlipidemia, unspecified: Secondary | ICD-10-CM

## 2020-06-27 DIAGNOSIS — M545 Low back pain: Secondary | ICD-10-CM

## 2020-06-27 DIAGNOSIS — R202 Paresthesia of skin: Secondary | ICD-10-CM | POA: Diagnosis not present

## 2020-06-27 DIAGNOSIS — G8929 Other chronic pain: Secondary | ICD-10-CM

## 2020-06-27 MED ORDER — FAMOTIDINE 40 MG PO TABS: 40.0000 mg | ORAL_TABLET | Freq: Every day | ORAL | 1 refills | Status: DC

## 2020-06-27 MED ORDER — ALPRAZOLAM 0.25 MG PO TABS: 0.1250 mg | ORAL_TABLET | Freq: Every evening | ORAL | 1 refills | Status: DC | PRN

## 2020-06-27 MED ORDER — CLOPIDOGREL BISULFATE 75 MG PO TABS: 75.0000 mg | ORAL_TABLET | Freq: Every day | ORAL | 4 refills | Status: DC

## 2020-06-27 NOTE — Patient Instructions (Signed)
.   Please review the attached list of medications and notify my office if there are any errors.   . Please bring all of your medications to every appointment so we can make sure that our medication list is the same as yours.    You should start back on simvastatin but stay off of hydrochlorothiazide.

## 2020-06-27 NOTE — Progress Notes (Signed)
I,April Miller,acting as a scribe for Mila Merry, MD.,have documented all relevant documentation on the behalf of Mila Merry, MD,as directed by  Mila Merry, MD while in the presence of Mila Merry, MD.   Established patient visit   Patient: Amy Ray   DOB: December 03, 1945   74 y.o. Female  MRN: [id number] Visit Date: 06/27/2020  Today's healthcare provider: Mila Merry, MD   Chief Complaint  Patient presents with  . Hospitalization Follow-up   Subjective    HPI  Follow up ER visit  Patient was seen in ER for tingling in both legs on  06/18/2020. She was seen for Leg paresthesia which resolved while waiting to be seen. Her labs were normal aside from mild anemia. She states tingling was from ankles to knees.   She was previously seen [facility] with low potassium of 2.8 and mildly elevated CK. Her potassium was replaced and she had her statin and diuretic discontinued.  She reports good compliance with treatment. She reports this condition is Improved.  She does have history cervical spondylosis on CT c-spine 02-05-2020 and anterolisthesis L4-5 on Xray l-spine 02-05-2020 --------------------------------------------------------------------  Patient states she feels much better since her ER visit. Her strength has gotten back to normal. She was having some pain her back which has resolved.      Medications: Outpatient Medications Prior to Visit  Medication Sig  . Acetaminophen (TYLENOL EX ST ARTHRITIS PAIN PO) Take 2 tablets by mouth daily as needed (for arthritis pain).  Marland Kitchen ALPRAZolam (XANAX) 0.5 MG tablet TAKE 1/2 TO 1 TABLET BY  MOUTH TWICE DAILY  . alum & mag hydroxide-simeth (MAALOX MAX) 400-400-40 MG/5ML suspension Take 5 mLs by mouth every 6 (six) hours as needed for indigestion.  Marland Kitchen aspirin 81 MG tablet Take 1 tablet by mouth daily.  . carbamazepine (TEGRETOL) 200 MG tablet TAKE 1 TABLET BY MOUTH TWICE DAILY  . cyanocobalamin 1000 MCG tablet Take 1,000  mcg by mouth daily.  Marland Kitchen diltiazem (DILT-XR) 180 MG 24 hr capsule TAKE 2 CAPSULES BY MOUTH  DAILY  . docusate sodium (COLACE) 100 MG capsule Take 100 mg by mouth 2 (two) times daily as needed for mild constipation.  . famotidine (PEPCID) 40 MG tablet Take 1 tablet (40 mg total) by mouth at bedtime.  . nortriptyline (PAMELOR) 10 MG capsule Take 1-2 capsules (10-20 mg total) by mouth at bedtime.  . clopidogrel (PLAVIX) 75 MG tablet TAKE 1 TABLET BY MOUTH  DAILY (Patient not taking: Reported on 06/27/2020)  . hydrochlorothiazide (HYDRODIURIL) 12.5 MG tablet TAKE 1 TABLET BY MOUTH  DAILY (Patient not taking: Reported on 06/27/2020)  . IRON PO Take 1 tablet by mouth daily. (Patient not taking: Reported on 06/27/2020)  . lidocaine (LIDODERM) 5 % Place 1 patch onto the skin daily. Remove & Discard patch within 12 hours or as directed by MD (Patient not taking: Reported on 06/27/2020)  . psyllium (METAMUCIL) 58.6 % packet Take 1 packet by mouth daily. (Patient not taking: Reported on 06/27/2020)  . simvastatin (ZOCOR) 20 MG tablet TAKE 1 TABLET BY MOUTH  DAILY (Patient not taking: Reported on 06/27/2020)   No facility-administered medications prior to visit.    Review of Systems  Constitutional: Negative for appetite change, chills, fatigue and fever.  Respiratory: Negative for chest tightness and shortness of breath.   Cardiovascular: Negative for chest pain and palpitations.  Gastrointestinal: Negative for abdominal pain, nausea and vomiting.  Neurological: Negative for dizziness and weakness.  Objective    BP 135/66 (BP Location: Right Arm, Patient Position: Sitting, Cuff Size: Large)   Pulse 95   Temp 98.6 F (37 C) (Oral)   Resp 18   Ht 4\' 11"  (1.499 m)   Wt 220 lb (99.8 kg)   SpO2 100%   BMI 44.43 kg/m    Physical Exam   General: Appearance:    Severely obese female in no acute distress  Eyes:    PERRL, conjunctiva/corneas clear, EOM's intact       Lungs:     Clear to auscultation  bilaterally, respirations unlabored  Heart:    Normal heart rate. Normal rhythm. No murmurs, rubs, or gallops.   MS:   All extremities are intact.   Neurologic:   Awake, alert, oriented x 3. No apparent focal neurological           defect.         No results found for any visits on 06/27/20.  Assessment & Plan     1. Paresthesia of both lower extremities Was affecting legs from knees to ankles, but resolved while waiting to be seen in ER and has since resolved. Suspected radiculopathy, but is now completely asymptomatic. Discussed imaging spine but this would likely not be covered in the absence of red flag symptoms.   2. Hypokalemia Corrected when she at [facility]. Is now off of hctz and BP is fairly good, so she probably does not need any additional potassium replacement.   3. History of CVA (cerebrovascular accident) She has been out of - clopidogrel (PLAVIX) 75 MG tablet for several months. I don't think the tingling sensation were from her CNS. Nevertheless, she should resume statin and clopidogrel   4. Essential (primary) hypertension Fairly well controlled even after hctz was discontinued due to hypokalemia.   5. Hyperlipidemia, unspecified hyperlipidemia type She is to start back on simvastatin which she has been previously taking for many years with no adverse effects.   6. Need for influenza vaccination  - Flu Vaccine QUAD High Dose(Fluad)  7. Chronic midline low back pain without sciatica Now resolved as dicussed above.   Refill - famotidine (PEPCID) 40 MG tablet; Take 1 tablet (40 mg total) by mouth at bedtime.  Dispense: 180 tablet; Refill: 1 - ALPRAZolam (XANAX) 0.25 MG tablet; Take 0.5-1 tablets (0.125-0.25 mg total) by mouth at bedtime as needed for sleep.  Dispense: 30 tablet; Refill: 1   Future Appointments  Date Time Provider Department Center  07/02/2020  8:20 AM [facility] HEALTH ADVISOR [facility]  09/30/2020  9:40 AM 10/02/2020, Sherrie Mustache, MD [facility]             Demetrios Isaacs, MD  [facility] [phone] (phone) [phone] (fax)  [facility]

## 2020-07-01 NOTE — Progress Notes (Signed)
Subjective:   Amy Ray is a 74 y.o. female who presents for Medicare Annual (Subsequent) preventive examination.  I connected with Amy Ray today by telephone and verified that I am speaking with the correct person using two identifiers. Location patient: home Location provider: work Persons participating in the virtual visit: patient, provider.   I discussed the limitations, risks, security and privacy concerns of performing an evaluation and management service by telephone and the availability of in person appointments. I also discussed with the patient that there may be a patient responsible charge related to this service. The patient expressed understanding and verbally consented to this telephonic visit.    Interactive audio and video telecommunications were attempted between this provider and patient, however failed, due to patient having technical difficulties OR patient did not have access to video capability.  We continued and completed visit with audio only.   Review of Systems    N/A  Cardiac Risk Factors include: advanced age (>30men, >61 women);dyslipidemia;obesity (BMI >30kg/m2)     Objective:    Today's Vitals   07/02/20 0815  PainSc: 0-No pain   There is no height or weight on file to calculate BMI.  Advanced Directives 07/02/2020 06/17/2020 06/09/2020 05/18/2020 02/05/2020 11/07/2019 06/29/2019  Does Patient Have a Medical Advance Directive? No No No No No No No  Would patient like information on creating a medical advance directive? No - Patient declined No - Patient declined - No - Patient declined No - Patient declined No - Patient declined No - Patient declined    Current Medications (verified) Outpatient Encounter Medications as of 07/02/2020  Medication Sig   Acetaminophen (TYLENOL EX ST ARTHRITIS PAIN PO) Take 2 tablets by mouth daily as needed (for arthritis pain).   alum & mag hydroxide-simeth (MAALOX MAX) 400-400-40 MG/5ML suspension Take 5 mLs by  mouth every 6 (six) hours as needed for indigestion.   aspirin 81 MG tablet Take 1 tablet by mouth daily.   carbamazepine (TEGRETOL) 200 MG tablet TAKE 1 TABLET BY MOUTH TWICE DAILY   clopidogrel (PLAVIX) 75 MG tablet Take 1 tablet (75 mg total) by mouth daily.   cyanocobalamin 1000 MCG tablet Take 1,000 mcg by mouth daily.   diltiazem (DILT-XR) 180 MG 24 hr capsule TAKE 2 CAPSULES BY MOUTH  DAILY   docusate sodium (COLACE) 100 MG capsule Take 100 mg by mouth 2 (two) times daily as needed for mild constipation.   famotidine (PEPCID) 40 MG tablet Take 1 tablet (40 mg total) by mouth at bedtime.   nortriptyline (PAMELOR) 10 MG capsule Take 1-2 capsules (10-20 mg total) by mouth at bedtime.   polyethylene glycol (MIRALAX / GLYCOLAX) 17 g packet Take 17 g by mouth daily as needed.   psyllium (METAMUCIL) 58.6 % packet Take 1 packet by mouth daily.    simvastatin (ZOCOR) 20 MG tablet TAKE 1 TABLET BY MOUTH  DAILY   ALPRAZolam (XANAX) 0.25 MG tablet Take 0.5-1 tablets (0.125-0.25 mg total) by mouth at bedtime as needed for sleep. (Patient not taking: Reported on 07/02/2020)   IRON PO Take 1 tablet by mouth daily. (Patient not taking: Reported on 07/02/2020)   lidocaine (LIDODERM) 5 % Place 1 patch onto the skin daily. Remove & Discard patch within 12 hours or as directed by MD (Patient not taking: Reported on 06/27/2020)   No facility-administered encounter medications on file as of 07/02/2020.    Allergies (verified) Augmentin [amoxicillin-pot clavulanate] and Quinapril-hydrochlorothiazide   History: Past Medical History:  Diagnosis  Date   History of diverticulitis of colon 05/20/2015   per patient report    Hyperlipidemia    Hypertension    Stroke [facility])    TIA (transient ischemic attack)    Vertigo    Past Surgical History:  Procedure Laterality Date   CHOLECYSTECTOMY  2000   HERNIA REPAIR     umbilical hernia as a child   Pseudophakia OS Left 02/12/2020   Dr.  Clydene Pugh   REPLACEMENT TOTAL KNEE Bilateral    Family History  Problem Relation Age of Onset   Heart attack Mother    Hypertension Mother    Alzheimer's disease Father    Diabetes Father    Hypertension Sister    Diabetes Brother    Hypertension Sister    Hypertension Sister    Hypertension Sister    Hypertension Sister    Breast cancer Sister 79   Breast cancer Maternal Aunt 33   Breast cancer Maternal Aunt    Social History   Socioeconomic History   Marital status: Married    Spouse name: Not on file   Number of children: 4   Years of education: Not on file   Highest education level: High school graduate  Occupational History   Occupation: Retired  Tobacco Use   Smoking status: Never Smoker   Smokeless tobacco: Never Used  Building services engineer Use: Never used  Substance and Sexual Activity   Alcohol use: No    Alcohol/week: 0.0 standard drinks   Drug use: No   Sexual activity: Not on file  Other Topics Concern   Not on file  Social History Narrative   Not on file   Social Determinants of Health   Financial Resource Strain: Low Risk    Difficulty of Paying Living Expenses: Not hard at all  Food Insecurity: No Food Insecurity   Worried About Programme researcher, broadcasting/film/video in the Last Year: Never true   Ran Out of Food in the Last Year: Never true  Transportation Needs: No Transportation Needs   Lack of Transportation (Medical): No   Lack of Transportation (Non-Medical): No  Physical Activity: Insufficiently Active   Days of Exercise per Week: 4 days   Minutes of Exercise per Session: 30 min  Stress: No Stress Concern Present   Feeling of Stress : Not at all  Social Connections: Moderately Isolated   Frequency of Communication with Friends and Family: More than three times a week   Frequency of Social Gatherings with Friends and Family: More than three times a week   Attends Religious Services: Never   Database administrator or  Organizations: No   Attends Engineer, structural: Never   Marital Status: Married    Tobacco Counseling Counseling given: Not Answered   Clinical Intake:  Pre-visit preparation completed: Yes  Pain : No/denies pain Pain Score: 0-No pain     Nutritional Risks: None Diabetes: No  How often do you need to have someone help you when you read instructions, pamphlets, or other written materials from your doctor or pharmacy?: 1 - Never  Diabetic? No  Interpreter Needed?: No  Information entered by :: [facility], LPN   Activities of Daily Living In your present state of health, do you have any difficulty performing the following activities: 07/02/2020 06/27/2020  Hearing? N N  Vision? N Y  Difficulty concentrating or making decisions? N N  Walking or climbing stairs? N Y  Dressing or bathing? N N  Doing errands,  shopping? N N  Preparing Food and eating ? N -  Using the Toilet? N -  In the past six months, have you accidently leaked urine? N -  Do you have problems with loss of bowel control? N -  Managing your Medications? N -  Managing your Finances? N -  Housekeeping or managing your Housekeeping? N -  Some recent data might be hidden    Patient Care Team: Malva LimesFisher, Donald E, MD as PCP - General (Family Medicine) Isla PenceWoodard, D Reid, OD as Consulting Physician (Optometry) Deeann SaintMiller, Howard, MD (Orthopedic Surgery) Alwyn Peaallwood, Dwayne D, MD as Consulting Physician (Cardiology)  Indicate any recent Medical Services you may have received from other than [facility] providers in the past year (date may be approximate).     Assessment:   This is a routine wellness examination for [place].  Hearing/Vision screen No exam data present  Dietary issues and exercise activities discussed: Current Exercise Habits: Home exercise routine, Type of exercise: Other - see comments (rides stationary bike), Time (Minutes): 30, Frequency (Times/Week): 4, Weekly Exercise (Minutes/Week): 120,  Intensity: Mild, Exercise limited by: None identified  Goals     DIET - EAT MORE VEGETABLES     Recommend increasing amount of vegetables to 2-3 servings a day.      Depression Screen PHQ 2/9 Scores 06/27/2020 06/25/2019 06/21/2018 06/16/2017 01/17/2017  PHQ - 2 Score 2 0 1 2 0  PHQ- 9 Score 8 - - 4 3    Fall Risk Fall Risk  07/02/2020 06/27/2020 06/25/2019 06/21/2018 06/16/2017  Falls in the past year? 0 1 1 Yes Yes  Number falls in past yr: 0 1 1 2  or more 2 or more  Injury with Fall? 0 0 0 No No  Risk for fall due to : - - - - -  Follow up - Falls evaluation completed Falls prevention discussed Falls prevention discussed Falls prevention discussed    Any stairs in or around the home? Yes  If so, are there any without handrails? No  Home free of loose throw rugs in walkways, pet beds, electrical cords, etc? Yes  Adequate lighting in your home to reduce risk of falls? Yes   ASSISTIVE DEVICES UTILIZED TO PREVENT FALLS:  Life alert? Yes  Use of a cane, walker or w/c? Yes  Grab bars in the bathroom? Yes  Shower chair or bench in shower? Yes  Elevated toilet seat or a handicapped toilet? Yes   Cognitive Function: Declined today.     6CIT Screen 06/25/2019  What Year? 0 points  What month? 0 points  What time? 0 points  Count back from 20 0 points  Months in reverse 0 points  Repeat phrase 2 points  Total Score 2    Immunizations Immunization History  Administered Date(s) Administered   Fluad Quad(high Dose 65+) 06/27/2020   Influenza, High Dose Seasonal PF 07/12/2016, 06/16/2017, 06/21/2018, 06/22/2019   Influenza-Unspecified 05/12/2015   Moderna SARS-COVID-2 Vaccination 11/24/2019, 12/24/2019   Pneumococcal Conjugate-13 12/05/2014   Pneumococcal Polysaccharide-23 05/09/2013   Tdap 08/12/2011    TDAP status: Up to date Flu Vaccine status: Up to date Pneumococcal vaccine status: Up to date Covid-19 vaccine status: Completed vaccines  Qualifies for Shingles  Vaccine? Yes   Zostavax completed No   Shingrix Completed?: No.    Education has been provided regarding the importance of this vaccine. Patient has been advised to call insurance company to determine out of pocket expense if they have not yet received this vaccine. Advised  may also receive vaccine at local pharmacy or Health Dept. Verbalized acceptance and understanding.  Screening Tests Health Maintenance  Topic Date Due   TETANUS/TDAP  08/11/2021   MAMMOGRAM  05/12/2022   Fecal DNA (Cologuard)  08/28/2022   DEXA SCAN  08/06/2024   INFLUENZA VACCINE  Completed   COVID-19 Vaccine  Completed   Hepatitis C Screening  Completed   PNA vac Low Risk Adult  Completed    Health Maintenance  There are no preventive care reminders to display for this patient.  Colorectal cancer screening: Cologuard completed 08/29/19. Repeat every 3 years Mammogram status: Completed 05/12/20. Repeat every year Bone Density status: Completed 08/07/19. Results reflect: Bone density results: NORMAL. Repeat every 5 years.  Lung Cancer Screening: (Low Dose CT Chest recommended if Age 66-80 years, 30 pack-year currently smoking OR have quit w/in 15years.) does not qualify.   Additional Screening:  Hepatitis C Screening: Up to date  Vision Screening: Recommended annual ophthalmology exams for early detection of glaucoma and other disorders of the eye. Is the patient up to date with their annual eye exam?  Yes  Who is the provider or what is the name of the office in which the patient attends annual eye exams? Dr Clydene Pugh If pt is not established with a provider, would they like to be referred to a provider to establish care? No .   Dental Screening: Recommended annual dental exams for proper oral hygiene  Community Resource Referral / Chronic Care Management: CRR required this visit?  No   CCM required this visit?  No      Plan:     I have personally reviewed and noted the following in the  patients chart:    Medical and social history  Use of alcohol, tobacco or illicit drugs   Current medications and supplements  Functional ability and status  Nutritional status  Physical activity  Advanced directives  List of other physicians  Hospitalizations, surgeries, and ER visits in previous 12 months  Vitals  Screenings to include cognitive, depression, and falls  Referrals and appointments  In addition, I have reviewed and discussed with patient certain preventive protocols, quality metrics, and best practice recommendations. A written personalized care plan for preventive services as well as general preventive health recommendations were provided to patient.     Destaney Sarkis [place], [place]   01/26/4080   Nurse Notes: None.

## 2020-07-02 ENCOUNTER — Ambulatory Visit (INDEPENDENT_AMBULATORY_CARE_PROVIDER_SITE_OTHER): Payer: Medicare Other

## 2020-07-02 ENCOUNTER — Other Ambulatory Visit: Payer: Self-pay

## 2020-07-02 DIAGNOSIS — Z0000 Encounter for general adult medical examination without abnormal findings: Secondary | ICD-10-CM | POA: Diagnosis not present

## 2020-07-02 NOTE — Patient Instructions (Signed)
Amy Ray , Thank you for taking time to come for your Medicare Wellness Visit. I appreciate your ongoing commitment to your health goals. Please review the following plan we discussed and let me know if I can assist you in the future.   Screening recommendations/referrals: Colonoscopy: Cologuard up to date, due 08/28/22 Mammogram: Up to date, due 05/2021 Bone Density: Up to date, due 07/2024 Recommended yearly ophthalmology/optometry visit for glaucoma screening and checkup Recommended yearly dental visit for hygiene and checkup  Vaccinations: Influenza vaccine: Up to date Pneumococcal vaccine: Completed series Tdap vaccine: Up to date, due 08/2021 Shingles vaccine: Shingrix discussed. Please contact your pharmacy for coverage information.     Advanced directives: Advance directive discussed with you today. Even though you declined this today please call our office should you change your mind and we can give you the proper paperwork for you to fill out.  Conditions/risks identified: Recommend increasing amount of vegetables to 2-3 servings a day.  Next appointment: 09/30/20 @ 9:40 AM with Dr Sherrie Mustache    Preventive Care 65 Years and Older, Female Preventive care refers to lifestyle choices and visits with your health care provider that can promote health and wellness. What does preventive care include?  A yearly physical exam. This is also called an annual well check.  Dental exams once or twice a year.  Routine eye exams. Ask your health care provider how often you should have your eyes checked.  Personal lifestyle choices, including:  Daily care of your teeth and gums.  Regular physical activity.  Eating a healthy diet.  Avoiding tobacco and drug use.  Limiting alcohol use.  Practicing safe sex.  Taking low-dose aspirin every day.  Taking vitamin and mineral supplements as recommended by your health care provider. What happens during an annual well check? The  services and screenings done by your health care provider during your annual well check will depend on your age, overall health, lifestyle risk factors, and family history of disease. Counseling  Your health care provider may ask you questions about your:  Alcohol use.  Tobacco use.  Drug use.  Emotional well-being.  Home and relationship well-being.  Sexual activity.  Eating habits.  History of falls.  Memory and ability to understand (cognition).  Work and work Astronomer.  Reproductive health. Screening  You may have the following tests or measurements:  Height, weight, and BMI.  Blood pressure.  Lipid and cholesterol levels. These may be checked every 5 years, or more frequently if you are over 67 years old.  Skin check.  Lung cancer screening. You may have this screening every year starting at age 41 if you have a 30-pack-year history of smoking and currently smoke or have quit within the past 15 years.  Fecal occult blood test (FOBT) of the stool. You may have this test every year starting at age 37.  Flexible sigmoidoscopy or colonoscopy. You may have a sigmoidoscopy every 5 years or a colonoscopy every 10 years starting at age 18.  Hepatitis C blood test.  Hepatitis B blood test.  Sexually transmitted disease (STD) testing.  Diabetes screening. This is done by checking your blood sugar (glucose) after you have not eaten for a while (fasting). You may have this done every 1-3 years.  Bone density scan. This is done to screen for osteoporosis. You may have this done starting at age 42.  Mammogram. This may be done every 1-2 years. Talk to your health care provider about how often you should  have regular mammograms. Talk with your health care provider about your test results, treatment options, and if necessary, the need for more tests. Vaccines  Your health care provider may recommend certain vaccines, such as:  Influenza vaccine. This is recommended  every year.  Tetanus, diphtheria, and acellular pertussis (Tdap, Td) vaccine. You may need a Td booster every 10 years.  Zoster vaccine. You may need this after age 68.  Pneumococcal 13-valent conjugate (PCV13) vaccine. One dose is recommended after age 43.  Pneumococcal polysaccharide (PPSV23) vaccine. One dose is recommended after age 41. Talk to your health care provider about which screenings and vaccines you need and how often you need them. This information is not intended to replace advice given to you by your health care provider. Make sure you discuss any questions you have with your health care provider. Document Released: 10/24/2015 Document Revised: 06/16/2016 Document Reviewed: 07/29/2015 Elsevier Interactive Patient Education  2017 [place] Prevention in the Home Falls can cause injuries. They can happen to people of all ages. There are many things you can do to make your home safe and to help prevent falls. What can I do on the outside of my home?  Regularly fix the edges of walkways and driveways and fix any cracks.  Remove anything that might make you trip as you walk through a door, such as a raised step or threshold.  Trim any bushes or trees on the path to your home.  Use bright outdoor lighting.  Clear any walking paths of anything that might make someone trip, such as rocks or tools.  Regularly check to see if handrails are loose or broken. Make sure that both sides of any steps have handrails.  Any raised decks and porches should have guardrails on the edges.  Have any leaves, snow, or ice cleared regularly.  Use sand or salt on walking paths during winter.  Clean up any spills in your garage right away. This includes oil or grease spills. What can I do in the bathroom?  Use night lights.  Install grab bars by the toilet and in the tub and shower. Do not use towel bars as grab bars.  Use non-skid mats or decals in the tub or shower.  If  you need to sit down in the shower, use a plastic, non-slip stool.  Keep the floor dry. Clean up any water that spills on the floor as soon as it happens.  Remove soap buildup in the tub or shower regularly.  Attach bath mats securely with double-sided non-slip rug tape.  Do not have throw rugs and other things on the floor that can make you trip. What can I do in the bedroom?  Use night lights.  Make sure that you have a light by your bed that is easy to reach.  Do not use any sheets or blankets that are too big for your bed. They should not hang down onto the floor.  Have a firm chair that has side arms. You can use this for support while you get dressed.  Do not have throw rugs and other things on the floor that can make you trip. What can I do in the kitchen?  Clean up any spills right away.  Avoid walking on wet floors.  Keep items that you use a lot in easy-to-reach places.  If you need to reach something above you, use a strong step stool that has a grab bar.  Keep electrical cords out of  the way.  Do not use floor polish or wax that makes floors slippery. If you must use wax, use non-skid floor wax.  Do not have throw rugs and other things on the floor that can make you trip. What can I do with my stairs?  Do not leave any items on the stairs.  Make sure that there are handrails on both sides of the stairs and use them. Fix handrails that are broken or loose. Make sure that handrails are as long as the stairways.  Check any carpeting to make sure that it is firmly attached to the stairs. Fix any carpet that is loose or worn.  Avoid having throw rugs at the top or bottom of the stairs. If you do have throw rugs, attach them to the floor with carpet tape.  Make sure that you have a light switch at the top of the stairs and the bottom of the stairs. If you do not have them, ask someone to add them for you. What else can I do to help prevent falls?  Wear shoes  that:  Do not have high heels.  Have rubber bottoms.  Are comfortable and fit you well.  Are closed at the toe. Do not wear sandals.  If you use a stepladder:  Make sure that it is fully opened. Do not climb a closed stepladder.  Make sure that both sides of the stepladder are locked into place.  Ask someone to hold it for you, if possible.  Clearly mark and make sure that you can see:  Any grab bars or handrails.  First and last steps.  Where the edge of each step is.  Use tools that help you move around (mobility aids) if they are needed. These include:  Canes.  Walkers.  Scooters.  Crutches.  Turn on the lights when you go into a dark area. Replace any light bulbs as soon as they burn out.  Set up your furniture so you have a clear path. Avoid moving your furniture around.  If any of your floors are uneven, fix them.  If there are any pets around you, be aware of where they are.  Review your medicines with your doctor. Some medicines can make you feel dizzy. This can increase your chance of falling. Ask your doctor what other things that you can do to help prevent falls. This information is not intended to replace advice given to you by your health care provider. Make sure you discuss any questions you have with your health care provider. Document Released: 07/24/2009 Document Revised: 03/04/2016 Document Reviewed: 11/01/2014 Elsevier Interactive Patient Education  2017 [employer].

## 2020-07-09 ENCOUNTER — Encounter (INDEPENDENT_AMBULATORY_CARE_PROVIDER_SITE_OTHER): Payer: Medicare Other | Admitting: Ophthalmology

## 2020-07-14 ENCOUNTER — Encounter (INDEPENDENT_AMBULATORY_CARE_PROVIDER_SITE_OTHER): Payer: Medicare Other | Admitting: Ophthalmology

## 2020-07-14 ENCOUNTER — Other Ambulatory Visit: Payer: Self-pay

## 2020-07-14 DIAGNOSIS — H59032 Cystoid macular edema following cataract surgery, left eye: Secondary | ICD-10-CM

## 2020-07-14 DIAGNOSIS — H35033 Hypertensive retinopathy, bilateral: Secondary | ICD-10-CM | POA: Diagnosis not present

## 2020-07-14 DIAGNOSIS — I10 Essential (primary) hypertension: Secondary | ICD-10-CM

## 2020-07-14 DIAGNOSIS — H2511 Age-related nuclear cataract, right eye: Secondary | ICD-10-CM

## 2020-07-14 DIAGNOSIS — H43813 Vitreous degeneration, bilateral: Secondary | ICD-10-CM

## 2020-07-21 ENCOUNTER — Telehealth: Payer: Self-pay

## 2020-07-21 NOTE — Telephone Encounter (Signed)
Copied from CRM [id number]. Topic: General - Other >> Jul 18, 2020  2:56 PM Marylen Ponto wrote: Reason for CRM: Pt stated she needs Dr. Theodis Aguas nurse to call her back. Pt did not go in to detail for her requesting call back.

## 2020-07-22 NOTE — Telephone Encounter (Signed)
Returned patient's call. She states she forgot what she was calling for.

## 2020-08-05 DIAGNOSIS — E876 Hypokalemia: Secondary | ICD-10-CM | POA: Insufficient documentation

## 2020-08-25 ENCOUNTER — Encounter (INDEPENDENT_AMBULATORY_CARE_PROVIDER_SITE_OTHER): Payer: Medicare Other | Admitting: Ophthalmology

## 2020-08-25 ENCOUNTER — Other Ambulatory Visit: Payer: Self-pay

## 2020-08-25 DIAGNOSIS — H35033 Hypertensive retinopathy, bilateral: Secondary | ICD-10-CM | POA: Diagnosis not present

## 2020-08-25 DIAGNOSIS — H43813 Vitreous degeneration, bilateral: Secondary | ICD-10-CM

## 2020-08-25 DIAGNOSIS — H59032 Cystoid macular edema following cataract surgery, left eye: Secondary | ICD-10-CM

## 2020-08-25 DIAGNOSIS — I10 Essential (primary) hypertension: Secondary | ICD-10-CM

## 2020-08-27 ENCOUNTER — Other Ambulatory Visit: Payer: Self-pay | Admitting: Family Medicine

## 2020-08-27 NOTE — Telephone Encounter (Signed)
Requested medication (s) are due for refill today: yes  Requested medication (s) are on the active medication list: yes   Last refill:  06/02/2020  Future visit scheduled: yes   Notes to clinic:  this refill cannot be delegated    Requested Prescriptions  Pending Prescriptions Disp Refills   carbamazepine (TEGRETOL) 200 MG tablet [Pharmacy Med Name: CARBAMAZEPINE 200MG  TABLETS] 180 tablet 4    Sig: TAKE 1 TABLET BY MOUTH TWICE DAILY      Not Delegated - Neurology:  Anticonvulsants - carbamazepine Failed - 08/27/2020  7:29 AM      Failed - This refill cannot be delegated      Failed - Carbamazepine (serum) in normal range and within 360 days    Carbamazepine Lvl  Date Value Ref Range Status  06/03/2019 10.8 4.0 - 12.0 ug/mL Final    Comment:    Performed at [facility], [address] [place], [place]          Failed - HGB in normal range and within 90 days    Hemoglobin  Date Value Ref Range Status  06/17/2020 11.4 (L) 12.0 - 15.0 g/dL Final  08/17/2020 17/51/0258 11.1 - 15.9 g/dL Final          Failed - HCT in normal range and within 90 days    HCT  Date Value Ref Range Status  06/17/2020 32.3 (L) 36 - 46 % Final   Hematocrit  Date Value Ref Range Status  07/12/2016 37.7 34.0 - 46.6 % Final          Passed - AST in normal range and within 90 days    AST  Date Value Ref Range Status  06/17/2020 27 15 - 41 U/L Final   SGOT(AST)  Date Value Ref Range Status  01/31/2015 28 U/L Final    Comment:    15-41 NOTE: New Reference Range  12/17/14           Passed - ALT in normal range and within 90 days    ALT  Date Value Ref Range Status  06/17/2020 24 0 - 44 U/L Final   SGPT (ALT)  Date Value Ref Range Status  01/31/2015 18 U/L Final    Comment:    14-54 NOTE: New Reference Range  12/17/14           Passed - WBC in normal range and within 90 days    WBC  Date Value Ref Range Status  06/17/2020 6.3 4.0 - 10.5 K/uL Final           Passed - PLT in normal range and within 90 days    Platelets  Date Value Ref Range Status  06/17/2020 219 150 - 400 K/uL Final  07/12/2016 210 150 - 379 x10E3/uL Final          Passed - Na in normal range and within 90 days    Sodium  Date Value Ref Range Status  06/17/2020 137 135 - 145 mmol/L Final  03/31/2020 142 134 - 144 mmol/L Final  01/31/2015 138 mmol/L Final    Comment:    135-145 NOTE: New Reference Range  12/17/14           Passed - Valid encounter within last 12 months    Recent Outpatient Visits           2 months ago History of CVA (cerebrovascular accident)   [facility], MD   4 months  ago Essential (primary) hypertension   [facility] Malva Limes, MD   9 months ago Gastroesophageal reflux disease without esophagitis   [facility] Malva Limes, MD   1 year ago Morbid obesity [facility])   [facility] Malva Limes, MD   1 year ago Essential (primary) hypertension   [facility] Fisher, Demetrios Isaacs, MD       Future Appointments             In 3 weeks Fisher, Demetrios Isaacs, MD [facility]

## 2020-09-17 ENCOUNTER — Ambulatory Visit: Payer: Self-pay | Admitting: Family Medicine

## 2020-09-19 ENCOUNTER — Ambulatory Visit (INDEPENDENT_AMBULATORY_CARE_PROVIDER_SITE_OTHER): Payer: Medicare Other | Admitting: Family Medicine

## 2020-09-19 ENCOUNTER — Encounter: Payer: Self-pay | Admitting: Family Medicine

## 2020-09-19 ENCOUNTER — Other Ambulatory Visit: Payer: Self-pay

## 2020-09-19 VITALS — BP 130/71 | HR 89 | Temp 98.1°F | Resp 20 | Wt 219.0 lb

## 2020-09-19 DIAGNOSIS — R609 Edema, unspecified: Secondary | ICD-10-CM

## 2020-09-19 DIAGNOSIS — N183 Chronic kidney disease, stage 3 unspecified: Secondary | ICD-10-CM

## 2020-09-19 DIAGNOSIS — F419 Anxiety disorder, unspecified: Secondary | ICD-10-CM

## 2020-09-19 DIAGNOSIS — E785 Hyperlipidemia, unspecified: Secondary | ICD-10-CM

## 2020-09-19 DIAGNOSIS — J301 Allergic rhinitis due to pollen: Secondary | ICD-10-CM

## 2020-09-19 DIAGNOSIS — I10 Essential (primary) hypertension: Secondary | ICD-10-CM

## 2020-09-19 DIAGNOSIS — G4700 Insomnia, unspecified: Secondary | ICD-10-CM

## 2020-09-19 MED ORDER — FLUTICASONE PROPIONATE 50 MCG/ACT NA SUSP: 2.0000 | Freq: Every day | NASAL | 6 refills | Status: DC

## 2020-09-19 MED ORDER — FLUTICASONE-SALMETEROL 250-50 MCG/DOSE IN AEPB: 1.0000 | INHALATION_SPRAY | Freq: Two times a day (BID) | RESPIRATORY_TRACT | 3 refills | Status: DC

## 2020-09-19 MED ORDER — FUROSEMIDE 20 MG PO TABS: 20.0000 mg | ORAL_TABLET | Freq: Every day | ORAL | 3 refills | Status: DC | PRN

## 2020-09-19 MED ORDER — ALPRAZOLAM 0.25 MG PO TABS: 0.1250 mg | ORAL_TABLET | Freq: Every evening | ORAL | 1 refills | Status: DC | PRN

## 2020-09-19 NOTE — Patient Instructions (Addendum)
.   Please review the attached list of medications and notify my office if there are any errors.   . Please bring all of your medications to every appointment so we can make sure that our medication list is the same as yours.    The CDC recommends two doses of Shingrix (the shingles vaccine) separated by 2 to 6 months for adults age 73 years and older. I recommend checking with your pharmacy plan regarding coverage for this vaccine.     

## 2020-09-19 NOTE — Progress Notes (Signed)
Established patient visit   Patient: Amy Ray   DOB: [birth year]   74 y.o. Female  MRN: [id number] Visit Date: 09/19/2020  Today's healthcare provider: Mila Merry, MD   Chief Complaint  Patient presents with  . Hypertension  . Hyperlipidemia  . Anxiety   Subjective    HPI  Hypertension, follow-up  BP Readings from Last 3 Encounters:  09/19/20 130/71  06/27/20 135/66  06/18/20 (!) 152/77   Wt Readings from Last 3 Encounters:  09/19/20 219 lb (99.3 kg)  06/27/20 220 lb (99.8 kg)  06/17/20 207 lb (93.9 kg)     She was last seen for hypertension 3 months ago.  BP at that visit was 135/66. Management since that visit includes no changes.  She reports good compliance with treatment. She is having side effects. Patient reports that she is having high readings at home.  She is following a Regular diet. She is not exercising. She does not smoke.  Use of agents associated with hypertension: none.   Outside blood pressures are checked occasionally. Patient reports that BP is averaging in the 160s/70s, but uses wrist monitor, not an arm cuff.   Symptoms: No chest pain No chest pressure  No palpitations No syncope  No dyspnea No orthopnea  No paroxysmal nocturnal dyspnea No lower extremity edema   Pertinent labs: Lab Results  Component Value Date   CHOL 173 07/30/2019   HDL 94 07/30/2019   LDLCALC 65 07/30/2019   TRIG 72 07/30/2019   CHOLHDL 1.8 07/30/2019   Lab Results  Component Value Date   NA 137 06/17/2020   K 4.2 06/17/2020   CREATININE 1.11 (H) 06/17/2020   GFRNONAA 49 (L) 06/17/2020   GFRAA 57 (L) 06/17/2020   GLUCOSE 96 06/17/2020     The ASCVD Risk score (Goff DC Jr., et al., 2013) failed to calculate for the following reasons:   The patient has a prior MI or stroke diagnosis   Lipid/Cholesterol, Follow-up  She was last seen for this 3 months ago.  Management since that visit includes starting back on simvastatin.  She reports good  compliance with treatment. She is not having side effects.   Current diet: well balanced Current exercise: no regular exercise   She also states she has had more anxiety lately related to personal and family issues. She stopped taking alprazolam several months ago, but feels like she may need to start back on it.   She has also been having drainage and congestion in the back of her throat and sinus congestion for several months. No sinus pain or colored drainage. Was prescribed fluticasone nasal spray in the past which she states was very helpful.      Medications: Outpatient Medications Prior to Visit  Medication Sig  . Acetaminophen (TYLENOL EX ST ARTHRITIS PAIN PO) Take 2 tablets by mouth daily as needed (for arthritis pain).  Marland Kitchen alum & mag hydroxide-simeth (MAALOX MAX) 400-400-40 MG/5ML suspension Take 5 mLs by mouth every 6 (six) hours as needed for indigestion.  Marland Kitchen aspirin 81 MG tablet Take 1 tablet by mouth daily.  . carbamazepine (TEGRETOL) 200 MG tablet TAKE 1 TABLET BY MOUTH TWICE DAILY  . clopidogrel (PLAVIX) 75 MG tablet Take 1 tablet (75 mg total) by mouth daily.  . cyanocobalamin 1000 MCG tablet Take 1,000 mcg by mouth daily.  Marland Kitchen diltiazem (DILT-XR) 180 MG 24 hr capsule TAKE 2 CAPSULES BY MOUTH  DAILY  . docusate sodium (COLACE) 100 MG capsule Take  100 mg by mouth 2 (two) times daily as needed for mild constipation.  . famotidine (PEPCID) 40 MG tablet Take 1 tablet (40 mg total) by mouth at bedtime.  . nortriptyline (PAMELOR) 10 MG capsule Take 1-2 capsules (10-20 mg total) by mouth at bedtime.  . polyethylene glycol (MIRALAX / GLYCOLAX) 17 g packet Take 17 g by mouth daily as needed.  . psyllium (METAMUCIL) 58.6 % packet Take 1 packet by mouth daily.   . simvastatin (ZOCOR) 20 MG tablet TAKE 1 TABLET BY MOUTH  DAILY  . ALPRAZolam (XANAX) 0.25 MG tablet Take 0.5-1 tablets (0.125-0.25 mg total) by mouth at bedtime as needed for sleep. (Patient not taking: No sig reported)  .  IRON PO Take 1 tablet by mouth daily. (Patient not taking: No sig reported)  . lidocaine (LIDODERM) 5 % Place 1 patch onto the skin daily. Remove & Discard patch within 12 hours or as directed by MD (Patient not taking: No sig reported)   No facility-administered medications prior to visit.    Review of Systems  Constitutional: Negative.   Respiratory: Negative.   Cardiovascular: Positive for leg swelling. Negative for chest pain and palpitations.  Musculoskeletal: Positive for arthralgias.  Allergic/Immunologic: Positive for environmental allergies.  Neurological: Positive for light-headedness. Negative for dizziness and headaches.  Psychiatric/Behavioral: The patient is nervous/anxious.       Objective    BP 130/71   Pulse 89   Temp 98.1 F (36.7 C)   Resp 20   Wt 219 lb (99.3 kg)   BMI 44.23 kg/m    Physical Exam   General: Appearance:    Severely obese female in no acute distress  Eyes:    PERRL, conjunctiva/corneas clear, EOM's intact       Lungs:     Clear to auscultation bilaterally, respirations unlabored  Heart:    Normal heart rate. Normal rhythm. No murmurs, rubs, or gallops.   MS:   All extremities are intact.   Neurologic:   Awake, alert, oriented x 3. No apparent focal neurological           defect.   Ext:  1+ edema.       Assessment & Plan     1. Essential (primary) hypertension Advised to use upper arm cuff to monitor BP at home. In-office reading is at [facility]. Off hctz due to hypokalemia. Continue current medications.    2. Stage 3 chronic kidney disease, unspecified whether stage 3a or 3b CKD (HCC) - VITAMIN D 25 Hydroxy (Vit-D Deficiency, Fractures)  3. Anxiety Will restart alprazolam as below.   4. Insomnia, unspecified type  - ALPRAZolam (XANAX) 0.25 MG tablet; Take 0.5-1 tablets (0.125-0.25 mg total) by mouth at bedtime as needed for sleep.  Dispense: 90 tablet; Refill: 1  5. Hyperlipidemia, unspecified hyperlipidemia type She is tolerating  simvastatin well with no adverse effects.   - CBC - Comprehensive metabolic panel - Lipid panel  6. Seasonal allergic rhinitis due to pollen Previously did well on Flonase nasal spray. New prescription sent to her mail order today.   7. Edema, unspecified type  - furosemide (LASIX) 20 MG tablet; Take 1 tablet (20 mg total) by mouth daily as needed for edema.  Dispense: 90 tablet; Refill: 3        The entirety of the information documented in the History of Present Illness, Review of Systems and Physical Exam were personally obtained by me. Portions of this information were initially documented by the CMA and reviewed  by me for thoroughness and accuracy.      Mila Merry, MD  [facility] [phone] (phone) [phone] (fax)  [facility]

## 2020-09-20 LAB — COMPREHENSIVE METABOLIC PANEL
ALT: 21 IU/L (ref 0–32)
AST: 26 IU/L (ref 0–40)
Albumin/Globulin Ratio: 1.4 (ref 1.2–2.2)
Albumin: 4.5 g/dL (ref 3.7–4.7)
Alkaline Phosphatase: 134 IU/L — ABNORMAL HIGH (ref 44–121)
BUN/Creatinine Ratio: 15 (ref 12–28)
BUN: 16 mg/dL (ref 8–27)
Bilirubin Total: 0.3 mg/dL (ref 0.0–1.2)
CO2: 21 mmol/L (ref 20–29)
Calcium: 9.2 mg/dL (ref 8.7–10.3)
Chloride: 103 mmol/L (ref 96–106)
Creatinine, Ser: 1.07 mg/dL — ABNORMAL HIGH (ref 0.57–1.00)
GFR calc Af Amer: 60 mL/min/{1.73_m2} (ref >59)
GFR calc non Af Amer: 52 mL/min/{1.73_m2} — ABNORMAL LOW (ref >59)
Globulin, Total: 3.3 g/dL (ref 1.5–4.5)
Glucose: 89 mg/dL (ref 65–99)
Potassium: 4.5 mmol/L (ref 3.5–5.2)
Sodium: 139 mmol/L (ref 134–144)
Total Protein: 7.8 g/dL (ref 6.0–8.5)

## 2020-09-20 LAB — CBC
Hematocrit: 37.4 % (ref 34.0–46.6)
Hemoglobin: 12.9 g/dL (ref 11.1–15.9)
MCH: 34.2 pg — ABNORMAL HIGH (ref 26.6–33.0)
MCHC: 34.5 g/dL (ref 31.5–35.7)
MCV: 99 fL — ABNORMAL HIGH (ref 79–97)
Platelets: 221 10*3/uL (ref 150–450)
RBC: 3.77 x10E6/uL (ref 3.77–5.28)
RDW: 11.9 % (ref 11.7–15.4)
WBC: 5.1 10*3/uL (ref 3.4–10.8)

## 2020-09-20 LAB — LIPID PANEL
Chol/HDL Ratio: 1.8 ratio (ref 0.0–4.4)
Cholesterol, Total: 197 mg/dL (ref 100–199)
HDL: 111 mg/dL (ref >39)
LDL Chol Calc (NIH): 74 mg/dL (ref 0–99)
Triglycerides: 69 mg/dL (ref 0–149)
VLDL Cholesterol Cal: 12 mg/dL (ref 5–40)

## 2020-09-20 LAB — VITAMIN D 25 HYDROXY (VIT D DEFICIENCY, FRACTURES): Vit D, 25-Hydroxy: 13.4 ng/mL — ABNORMAL LOW (ref 30.0–100.0)

## 2020-09-21 ENCOUNTER — Encounter: Payer: Self-pay | Admitting: Family Medicine

## 2020-09-21 DIAGNOSIS — E559 Vitamin D deficiency, unspecified: Secondary | ICD-10-CM | POA: Insufficient documentation

## 2020-09-30 ENCOUNTER — Ambulatory Visit: Payer: Self-pay | Admitting: Family Medicine

## 2020-10-21 DIAGNOSIS — Z20822 Contact with and (suspected) exposure to covid-19: Secondary | ICD-10-CM | POA: Diagnosis not present

## 2020-11-06 ENCOUNTER — Emergency Department: Payer: Medicare Other

## 2020-11-06 ENCOUNTER — Emergency Department
Admission: EM | Admit: 2020-11-06 | Discharge: 2020-11-06 | Disposition: A | Discharge status: Home / Self Care | Payer: Medicare Other | Attending: Student in an Organized Health Care Education/Training Program | Admitting: Student in an Organized Health Care Education/Training Program

## 2020-11-06 ENCOUNTER — Encounter: Payer: Self-pay | Admitting: Emergency Medicine

## 2020-11-06 ENCOUNTER — Other Ambulatory Visit: Payer: Self-pay

## 2020-11-06 DIAGNOSIS — W19XXXA Unspecified fall, initial encounter: Secondary | ICD-10-CM | POA: Diagnosis not present

## 2020-11-06 DIAGNOSIS — R296 Repeated falls: Secondary | ICD-10-CM | POA: Diagnosis not present

## 2020-11-06 DIAGNOSIS — Y92009 Unspecified place in unspecified non-institutional (private) residence as the place of occurrence of the external cause: Secondary | ICD-10-CM | POA: Diagnosis not present

## 2020-11-06 DIAGNOSIS — Z7982 Long term (current) use of aspirin: Secondary | ICD-10-CM | POA: Diagnosis not present

## 2020-11-06 DIAGNOSIS — Z743 Need for continuous supervision: Secondary | ICD-10-CM | POA: Diagnosis not present

## 2020-11-06 DIAGNOSIS — S0990XA Unspecified injury of head, initial encounter: Secondary | ICD-10-CM | POA: Insufficient documentation

## 2020-11-06 DIAGNOSIS — J320 Chronic maxillary sinusitis: Secondary | ICD-10-CM | POA: Diagnosis not present

## 2020-11-06 DIAGNOSIS — I10 Essential (primary) hypertension: Secondary | ICD-10-CM | POA: Diagnosis not present

## 2020-11-06 DIAGNOSIS — M2578 Osteophyte, vertebrae: Secondary | ICD-10-CM | POA: Diagnosis not present

## 2020-11-06 DIAGNOSIS — Z96653 Presence of artificial knee joint, bilateral: Secondary | ICD-10-CM | POA: Diagnosis not present

## 2020-11-06 DIAGNOSIS — Z79899 Other long term (current) drug therapy: Secondary | ICD-10-CM | POA: Diagnosis not present

## 2020-11-06 DIAGNOSIS — Z8673 Personal history of transient ischemic attack (TIA), and cerebral infarction without residual deficits: Secondary | ICD-10-CM | POA: Diagnosis not present

## 2020-11-06 DIAGNOSIS — M47812 Spondylosis without myelopathy or radiculopathy, cervical region: Secondary | ICD-10-CM | POA: Diagnosis not present

## 2020-11-06 DIAGNOSIS — M25552 Pain in left hip: Secondary | ICD-10-CM | POA: Insufficient documentation

## 2020-11-06 DIAGNOSIS — M1612 Unilateral primary osteoarthritis, left hip: Secondary | ICD-10-CM | POA: Diagnosis not present

## 2020-11-06 DIAGNOSIS — M533 Sacrococcygeal disorders, not elsewhere classified: Secondary | ICD-10-CM | POA: Diagnosis not present

## 2020-11-06 DIAGNOSIS — R5381 Other malaise: Secondary | ICD-10-CM | POA: Diagnosis not present

## 2020-11-06 DIAGNOSIS — M545 Low back pain, unspecified: Secondary | ICD-10-CM | POA: Diagnosis not present

## 2020-11-06 DIAGNOSIS — S199XXA Unspecified injury of neck, initial encounter: Secondary | ICD-10-CM | POA: Diagnosis not present

## 2020-11-06 DIAGNOSIS — I6523 Occlusion and stenosis of bilateral carotid arteries: Secondary | ICD-10-CM | POA: Diagnosis not present

## 2020-11-06 DIAGNOSIS — M810 Age-related osteoporosis without current pathological fracture: Secondary | ICD-10-CM | POA: Diagnosis not present

## 2020-11-06 NOTE — ED Triage Notes (Signed)
Arrives via [facility] with c/o several week history of increased falls.  Larey Seat this morning when getting out of bed.  Denies dizziness or syncope.  Per report falls have been mechanical.  C/O left hip pain.  Patient was ambulatory with cane.  VS wnl.

## 2020-11-06 NOTE — ED Provider Notes (Signed)
[facility] Emergency Department Provider Note    Event Date/Time   First MD Initiated Contact with Patient 11/06/20 1132     (approximate)  I have reviewed the triage vital signs and the nursing notes.   HISTORY  Chief Complaint Hip Pain    HPI Amy Ray is a 75 y.o. female below listed past medical history presents to the ER for evaluation of left hip pain after fall.  Reportedly has been having multiple falls at home did fall striking left side of her head.  No LOC.  States that she fell not using her cane is status post be using a cane or walker at home.  Denies any chest pain or pressure.  States that she has trouble walking due to severe arthritis in her legs.  No new pain or discomfort    Past Medical History:  Diagnosis Date  . History of diverticulitis of colon 05/20/2015   per patient report   . Hyperlipidemia   . Hypertension   . Stroke (HCC)   . TIA (transient ischemic attack)   . Vertigo    Family History  Problem Relation Age of Onset  . Heart attack Mother   . Hypertension Mother   . Alzheimer's disease Father   . Diabetes Father   . Hypertension Sister   . Diabetes Brother   . Hypertension Sister   . Hypertension Sister   . Hypertension Sister   . Hypertension Sister   . Breast cancer Sister 75  . Breast cancer Maternal Aunt 70  . Breast cancer Maternal Aunt    Past Surgical History:  Procedure Laterality Date  . CHOLECYSTECTOMY  2000  . HERNIA REPAIR     umbilical hernia as a child  . Pseudophakia OS Left 02/12/2020   Dr. Clydene Pugh  . REPLACEMENT TOTAL KNEE Bilateral    Patient Active Problem List   Diagnosis Date Noted  . Vitamin D deficiency 09/21/2020  . Low back pain 05/20/2015  . Fatigue 05/20/2015  . History of CVA (cerebrovascular accident) 05/20/2015  . Insomnia 05/20/2015  . Morbid obesity (HCC) 05/20/2015  . Positive H. pylori test 05/20/2015  . Tremor 05/20/2015  . Dysfunction of eustachian tube  05/20/2015  . Diverticulosis 05/20/2015  . Essential (primary) hypertension 07/17/2007  . Alterations of sensations, late effect of cerebrovascular disease 03/11/2005  . Hyperlipidemia 10/12/2003  . Trigeminal neuralgia 10/12/2003      Prior to Admission medications   Medication Sig Start Date End Date Taking? Authorizing Provider  Acetaminophen (TYLENOL EX ST ARTHRITIS PAIN PO) Take 2 tablets by mouth daily as needed (for arthritis pain).    [provider]  ALPRAZolam [name]) 0.25 MG tablet Take 0.5-1 tablets (0.125-0.25 mg total) by mouth at bedtime as needed for sleep. 09/19/20   Malva Limes, MD  alum & mag hydroxide-simeth (MAALOX MAX) 400-400-40 MG/5ML suspension Take 5 mLs by mouth every 6 (six) hours as needed for indigestion. 11/07/19   Nita Sickle, MD  aspirin 81 MG tablet Take 1 tablet by mouth daily.    [provider]  carbamazepine (TEGRETOL) 200 MG tablet TAKE 1 TABLET BY MOUTH TWICE DAILY 08/27/20   Malva Limes, MD  clopidogrel (PLAVIX) 75 MG tablet Take 1 tablet (75 mg total) by mouth daily. 06/27/20   Malva Limes, MD  cyanocobalamin 1000 MCG tablet Take 1,000 mcg by mouth daily.    [provider]  diltiazem (DILT-XR) 180 MG 24 hr capsule TAKE 2 CAPSULES BY  MOUTH  DAILY 04/26/20   Malva Limes, MD  docusate sodium (COLACE) 100 MG capsule Take 100 mg by mouth 2 (two) times daily as needed for mild constipation.    [provider]  famotidine (PEPCID) 40 MG tablet Take 1 tablet (40 mg total) by mouth at bedtime. 06/27/20 06/27/21  Malva Limes, MD  fluticasone (FLONASE) 50 MCG/ACT nasal spray Place 2 sprays into both nostrils daily. 09/19/20   Malva Limes, MD  furosemide (LASIX) 20 MG tablet Take 1 tablet (20 mg total) by mouth daily as needed for edema. 09/19/20   Malva Limes, MD  IRON PO Take 1 tablet by mouth daily. Patient not taking: No sig reported    [provider]  lidocaine (LIDODERM) 5  % Place 1 patch onto the skin daily. Remove & Discard patch within 12 hours or as directed by MD Patient not taking: No sig reported 02/05/20   Enid Derry, PA-C  nortriptyline (PAMELOR) 10 MG capsule Take 1-2 capsules (10-20 mg total) by mouth at bedtime. 05/12/20   Malva Limes, MD  polyethylene glycol (MIRALAX / GLYCOLAX) 17 g packet Take 17 g by mouth daily as needed.    [provider]  psyllium (METAMUCIL) 58.6 % packet Take 1 packet by mouth daily.     [provider]  simvastatin (ZOCOR) 20 MG tablet TAKE 1 TABLET BY MOUTH  DAILY 11/14/19   Malva Limes, MD    Allergies Augmentin [amoxicillin-pot clavulanate] and Quinapril-hydrochlorothiazide    Social History Social History   Tobacco Use  . Smoking status: Never Smoker  . Smokeless tobacco: Never Used  Vaping Use  . Vaping Use: Never used  Substance Use Topics  . Alcohol use: No    Alcohol/week: 0.0 standard drinks  . Drug use: No    Review of Systems Patient denies headaches, rhinorrhea, blurry vision, numbness, shortness of breath, chest pain, edema, cough, abdominal pain, nausea, vomiting, diarrhea, dysuria, fevers, rashes or hallucinations unless otherwise stated above in HPI. ____________________________________________   PHYSICAL EXAM:  VITAL SIGNS: Vitals:   11/06/20 0918 11/06/20 1203  BP: 138/76 (!) 151/87  Pulse: 85 79  Resp: 16 15  Temp: 97.9 F (36.6 C)   SpO2: 98% 97%    Constitutional: Alert and oriented.  Eyes: Conjunctivae are normal.  Head: Atraumatic. Nose: No congestion/rhinnorhea. Mouth/Throat: Mucous membranes are moist.   Neck: No stridor. Painless ROM.  Cardiovascular: Normal rate, regular rhythm. Grossly normal heart sounds.  Good peripheral circulation. Respiratory: Normal respiratory effort.  No retractions. Lungs CTAB. Gastrointestinal: Soft and nontender. No distention. No abdominal bruits. No CVA tenderness. Genitourinary:  Musculoskeletal: No lower  extremity tenderness nor edema.  No joint effusions. Neurologic:  Normal speech and language. No gross focal neurologic deficits are appreciated. No facial droop Skin:  Skin is warm, dry and intact. No rash noted. Psychiatric: Mood and affect are normal. Speech and behavior are normal.  ____________________________________________   LABS (all labs ordered are listed, but only abnormal results are displayed)  No results found for this or any previous visit (from the past 24 hour(s)). ____________________________________________ ____________________________________________  RADIOLOGY  I personally reviewed all radiographic images ordered to evaluate for the above acute complaints and reviewed radiology reports and findings.  These findings were personally discussed with the patient.  Please see medical record for radiology report.  ____________________________________________   PROCEDURES  Procedure(s) performed:  Procedures    Critical Care performed: no ____________________________________________   INITIAL IMPRESSION /  ASSESSMENT AND PLAN / ED COURSE  Pertinent labs & imaging results that were available during my care of the patient were reviewed by me and considered in my medical decision making (see chart for details).   DDX: fracture, contusion, arthritis, sdh, iph  Shiasia Porro is a 75 y.o. who presents to the ED with presentation as described above.  Patient hemodynamically stable clinically well-appearing.  No focal neuro deficits.  Does not seem consistent with CVA.  History suggest this is secondary to bad arthritis in the patient's was to be using a walking assistive device which she was not using this morning.  CT imaging will be ordered to evaluate for any evidence of head injury she did strike her head.  Doubt CVA based on history and clinical exam.  Denies any poor appetite or any symptoms to suggest need for laboratory evaluation at this time.  No  dysuria.  Clinical Course as of 11/06/20 1418  Thu Nov 06, 2020  1413 Imaging is reassuring.  Patient is able to ambulate with steady gait.  Feels fine no additional complaints.  Does appear appropriate for discharge home. [PR]    Clinical Course User Index [PR] Willy Eddy, MD    The patient was evaluated in Emergency Department today for the symptoms described in the history of present illness. He/she was evaluated in the context of the global COVID-19 pandemic, which necessitated consideration that the patient might be at risk for infection with the SARS-CoV-2 virus that causes COVID-19. Institutional protocols and algorithms that pertain to the evaluation of patients at risk for COVID-19 are in a state of rapid change based on information released by regulatory bodies including the CDC and federal and state organizations. These policies and algorithms were followed during the patient's care in the ED.  As part of my medical decision making, I reviewed the following data within the electronic MEDICAL RECORD NUMBER Nursing notes reviewed and incorporated, Labs reviewed, notes from prior ED visits and [place] Controlled Substance Database   ____________________________________________   FINAL CLINICAL IMPRESSION(S) / ED DIAGNOSES  Final diagnoses:  Left hip pain  Frequent falls      NEW MEDICATIONS STARTED DURING THIS VISIT:  New Prescriptions   No medications on file     Note:  This document was prepared using Dragon voice recognition software and may include unintentional dictation errors.    Willy Eddy, MD 11/06/20 [phone]

## 2020-11-25 ENCOUNTER — Encounter (INDEPENDENT_AMBULATORY_CARE_PROVIDER_SITE_OTHER): Payer: Medicare Other | Admitting: Ophthalmology

## 2020-12-01 ENCOUNTER — Encounter (INDEPENDENT_AMBULATORY_CARE_PROVIDER_SITE_OTHER): Payer: Medicare Other | Admitting: Ophthalmology

## 2020-12-01 ENCOUNTER — Other Ambulatory Visit: Payer: Self-pay

## 2020-12-01 DIAGNOSIS — H59032 Cystoid macular edema following cataract surgery, left eye: Secondary | ICD-10-CM

## 2020-12-01 DIAGNOSIS — H35033 Hypertensive retinopathy, bilateral: Secondary | ICD-10-CM

## 2020-12-01 DIAGNOSIS — I10 Essential (primary) hypertension: Secondary | ICD-10-CM | POA: Diagnosis not present

## 2020-12-01 DIAGNOSIS — H43813 Vitreous degeneration, bilateral: Secondary | ICD-10-CM

## 2020-12-13 ENCOUNTER — Other Ambulatory Visit: Payer: Self-pay | Admitting: Family Medicine

## 2020-12-13 NOTE — Telephone Encounter (Signed)
Requested Prescriptions  Pending Prescriptions Disp Refills  . simvastatin (ZOCOR) 20 MG tablet [Pharmacy Med Name: Simvastatin 20 MG Oral Tablet] 90 tablet 2    Sig: TAKE 1 TABLET BY MOUTH  DAILY     Cardiovascular:  Antilipid - Statins Failed - 12/13/2020  4:47 AM      Failed - LDL in normal range and within 360 days    LDL Chol Calc (NIH)  Date Value Ref Range Status  09/19/2020 74 0 - 99 mg/dL Final         Passed - Total Cholesterol in normal range and within 360 days    Cholesterol, Total  Date Value Ref Range Status  09/19/2020 197 100 - 199 mg/dL Final         Passed - HDL in normal range and within 360 days    HDL  Date Value Ref Range Status  09/19/2020 111 >39 mg/dL Final         Passed - Triglycerides in normal range and within 360 days    Triglycerides  Date Value Ref Range Status  09/19/2020 69 0 - 149 mg/dL Final         Passed - Patient is not pregnant      Passed - Valid encounter within last 12 months    Recent Outpatient Visits          2 months ago Essential (primary) hypertension   [facility] Malva Limes, MD   5 months ago History of CVA (cerebrovascular accident)   [facility] Malva Limes, MD   8 months ago Essential (primary) hypertension   [facility] Malva Limes, MD   1 year ago Gastroesophageal reflux disease without esophagitis   [facility] Fisher, Demetrios Isaacs, MD   1 year ago Morbid obesity [facility])   [facility] Sherrie Mustache, Demetrios Isaacs, MD

## 2020-12-30 ENCOUNTER — Other Ambulatory Visit: Payer: Self-pay

## 2020-12-30 ENCOUNTER — Emergency Department
Admission: EM | Admit: 2020-12-30 | Discharge: 2020-12-30 | Disposition: A | Discharge status: Home / Self Care | Payer: Medicare Other | Attending: Emergency Medicine | Admitting: Emergency Medicine

## 2020-12-30 ENCOUNTER — Emergency Department: Payer: Medicare Other

## 2020-12-30 ENCOUNTER — Ambulatory Visit: Payer: Self-pay

## 2020-12-30 DIAGNOSIS — Z743 Need for continuous supervision: Secondary | ICD-10-CM | POA: Diagnosis not present

## 2020-12-30 DIAGNOSIS — Z7901 Long term (current) use of anticoagulants: Secondary | ICD-10-CM | POA: Insufficient documentation

## 2020-12-30 DIAGNOSIS — Z96653 Presence of artificial knee joint, bilateral: Secondary | ICD-10-CM | POA: Insufficient documentation

## 2020-12-30 DIAGNOSIS — Z79899 Other long term (current) drug therapy: Secondary | ICD-10-CM | POA: Diagnosis not present

## 2020-12-30 DIAGNOSIS — Z8673 Personal history of transient ischemic attack (TIA), and cerebral infarction without residual deficits: Secondary | ICD-10-CM | POA: Diagnosis not present

## 2020-12-30 DIAGNOSIS — Z7982 Long term (current) use of aspirin: Secondary | ICD-10-CM | POA: Diagnosis not present

## 2020-12-30 DIAGNOSIS — R2689 Other abnormalities of gait and mobility: Secondary | ICD-10-CM | POA: Diagnosis not present

## 2020-12-30 DIAGNOSIS — R269 Unspecified abnormalities of gait and mobility: Secondary | ICD-10-CM | POA: Diagnosis not present

## 2020-12-30 DIAGNOSIS — R404 Transient alteration of awareness: Secondary | ICD-10-CM | POA: Diagnosis not present

## 2020-12-30 DIAGNOSIS — I10 Essential (primary) hypertension: Secondary | ICD-10-CM | POA: Insufficient documentation

## 2020-12-30 DIAGNOSIS — R2681 Unsteadiness on feet: Secondary | ICD-10-CM

## 2020-12-30 DIAGNOSIS — R42 Dizziness and giddiness: Secondary | ICD-10-CM | POA: Diagnosis not present

## 2020-12-30 LAB — COMPREHENSIVE METABOLIC PANEL
ALT: 23 U/L (ref 0–44)
AST: 29 U/L (ref 15–41)
Albumin: 3.9 g/dL (ref 3.5–5.0)
Alkaline Phosphatase: 87 U/L (ref 38–126)
Anion gap: 11 (ref 5–15)
BUN: 40 mg/dL — ABNORMAL HIGH (ref 8–23)
CO2: 26 mmol/L (ref 22–32)
Calcium: 8.9 mg/dL (ref 8.9–10.3)
Chloride: 99 mmol/L (ref 98–111)
Creatinine, Ser: 1.37 mg/dL — ABNORMAL HIGH (ref 0.44–1.00)
GFR, Estimated: 41 mL/min — ABNORMAL LOW (ref >60)
Glucose, Bld: 94 mg/dL (ref 70–99)
Potassium: 3.5 mmol/L (ref 3.5–5.1)
Sodium: 136 mmol/L (ref 135–145)
Total Bilirubin: 0.5 mg/dL (ref 0.3–1.2)
Total Protein: 7.5 g/dL (ref 6.5–8.1)

## 2020-12-30 LAB — CBC WITH DIFFERENTIAL/PLATELET
Abs Immature Granulocytes: 0.01 10*3/uL (ref 0.00–0.07)
Basophils Absolute: 0 10*3/uL (ref 0.0–0.1)
Basophils Relative: 1 %
Eosinophils Absolute: 0.1 10*3/uL (ref 0.0–0.5)
Eosinophils Relative: 1 %
HCT: 36.2 % (ref 36.0–46.0)
Hemoglobin: 12 g/dL (ref 12.0–15.0)
Immature Granulocytes: 0 %
Lymphocytes Relative: 29 %
Lymphs Abs: 1.7 10*3/uL (ref 0.7–4.0)
MCH: 33.4 pg (ref 26.0–34.0)
MCHC: 33.1 g/dL (ref 30.0–36.0)
MCV: 100.8 fL — ABNORMAL HIGH (ref 80.0–100.0)
Monocytes Absolute: 0.7 10*3/uL (ref 0.1–1.0)
Monocytes Relative: 12 %
Neutro Abs: 3.5 10*3/uL (ref 1.7–7.7)
Neutrophils Relative %: 57 %
Platelets: 200 10*3/uL (ref 150–400)
RBC: 3.59 MIL/uL — ABNORMAL LOW (ref 3.87–5.11)
RDW: 11.9 % (ref 11.5–15.5)
WBC: 6 10*3/uL (ref 4.0–10.5)
nRBC: 0 % (ref 0.0–0.2)

## 2020-12-30 LAB — TROPONIN I (HIGH SENSITIVITY): Troponin I (High Sensitivity): 7 ng/L (ref <18)

## 2020-12-30 NOTE — Discharge Instructions (Signed)
Use Tylenol for pain and fevers.  Up to 1000 mg per dose, up to 4 times per day.  Do not take more than 4000 mg of Tylenol/acetaminophen within 24 hours..  If you develop any gait changes that do not go away, gait changes with other strokelike symptoms, please return to the ED.

## 2020-12-30 NOTE — ED Notes (Signed)
2 nurses attempting to get bloodwork.

## 2020-12-30 NOTE — ED Notes (Signed)
Provided crackers and water to pt per EDP order. Pt resting in bed.

## 2020-12-30 NOTE — ED Triage Notes (Signed)
Pt to ED from home via [facility] c/o dizziness that started today at 1100. Denies recent falls. EMS states pt is compliant with medications and does not have DM. EMS said pt has hx vertigo , pt states this is new. Pt A&) and in NAD. Last BP was 134/73.

## 2020-12-30 NOTE — ED Provider Notes (Signed)
[facility] Emergency Department Provider Note ____________________________________________   Event Date/Time   First MD Initiated Contact with Patient 12/30/20 1600     (approximate)  I have reviewed the triage vital signs and the nursing notes.  HISTORY  Chief Complaint Dizziness   HPI Amy Ray is a 75 y.o. femalewho presents to the ED for evaluation of dizziness.   Chart review indicates history of HTN, HLD and stroke.  Patient presents to the ED for evaluation of transient gait abnormality.  Despite chief complaint of dizziness documented, she denies any dizziness or symptoms beyond her "drunken walking."  Patient reports that around lunchtime today she was ambulating without her cane and felt like she was drunk in terms of her gait becoming unsteady.  She denies any headache, back or leg pain, injuries or falls.  She denies any sensation changes throughout her body.  Denies focal weakness to either leg, or other parts of her body.  She reports that she "just felt like I was drunk, but had not been drinking."  She reports this lasted maybe 2 hours, self resolved and has not recurred.  She denies any associated difficulty speaking, vision changes, facial droop.  She reports that she feels fine now.    Past Medical History:  Diagnosis Date  . History of diverticulitis of colon 05/20/2015   per patient report   . Hyperlipidemia   . Hypertension   . Stroke (HCC)   . TIA (transient ischemic attack)   . Vertigo     Patient Active Problem List   Diagnosis Date Noted  . Vitamin D deficiency 09/21/2020  . Low back pain 05/20/2015  . Fatigue 05/20/2015  . History of CVA (cerebrovascular accident) 05/20/2015  . Insomnia 05/20/2015  . Morbid obesity (HCC) 05/20/2015  . Positive H. pylori test 05/20/2015  . Tremor 05/20/2015  . Dysfunction of eustachian tube 05/20/2015  . Diverticulosis 05/20/2015  . Essential (primary) hypertension 07/17/2007  .  Alterations of sensations, late effect of cerebrovascular disease 03/11/2005  . Hyperlipidemia 10/12/2003  . Trigeminal neuralgia 10/12/2003    Past Surgical History:  Procedure Laterality Date  . CHOLECYSTECTOMY  2000  . HERNIA REPAIR     umbilical hernia as a child  . Pseudophakia OS Left 02/12/2020   Dr. Clydene PughWoodard  . REPLACEMENT TOTAL KNEE Bilateral     Prior to Admission medications   Medication Sig Start Date End Date Taking? Authorizing Provider  Acetaminophen (TYLENOL EX ST ARTHRITIS PAIN PO) Take 2 tablets by mouth daily as needed (for arthritis pain).    [provider]  ALPRAZolam [name](XANAX) 0.25 MG tablet Take 0.5-1 tablets (0.125-0.25 mg total) by mouth at bedtime as needed for sleep. 09/19/20   Malva LimesFisher, Donald E, MD  alum & mag hydroxide-simeth (MAALOX MAX) 400-400-40 MG/5ML suspension Take 5 mLs by mouth every 6 (six) hours as needed for indigestion. 11/07/19   Nita SickleVeronese, [place], MD  aspirin 81 MG tablet Take 1 tablet by mouth daily.    [provider]  carbamazepine (TEGRETOL) 200 MG tablet TAKE 1 TABLET BY MOUTH TWICE DAILY 08/27/20   Malva LimesFisher, Donald E, MD  clopidogrel (PLAVIX) 75 MG tablet Take 1 tablet (75 mg total) by mouth daily. 06/27/20   Malva LimesFisher, Donald E, MD  cyanocobalamin 1000 MCG tablet Take 1,000 mcg by mouth daily.    [provider]  diltiazem (DILT-XR) 180 MG 24 hr capsule TAKE 2 CAPSULES BY MOUTH  DAILY 04/26/20   Malva LimesFisher, Donald E, MD  docusate  sodium (COLACE) 100 MG capsule Take 100 mg by mouth 2 (two) times daily as needed for mild constipation.    [provider]  famotidine (PEPCID) 40 MG tablet Take 1 tablet (40 mg total) by mouth at bedtime. 06/27/20 06/27/21  Malva Limes, MD  fluticasone (FLONASE) 50 MCG/ACT nasal spray Place 2 sprays into both nostrils daily. 09/19/20   Malva Limes, MD  furosemide (LASIX) 20 MG tablet Take 1 tablet (20 mg total) by mouth daily as needed for edema. 09/19/20   Malva Limes, MD   IRON PO Take 1 tablet by mouth daily. Patient not taking: No sig reported    [provider]  lidocaine (LIDODERM) 5 % Place 1 patch onto the skin daily. Remove & Discard patch within 12 hours or as directed by MD Patient not taking: No sig reported 02/05/20   Enid Derry, PA-C  nortriptyline (PAMELOR) 10 MG capsule Take 1-2 capsules (10-20 mg total) by mouth at bedtime. 05/12/20   Malva Limes, MD  polyethylene glycol (MIRALAX / GLYCOLAX) 17 g packet Take 17 g by mouth daily as needed.    [provider]  psyllium (METAMUCIL) 58.6 % packet Take 1 packet by mouth daily.     [provider]  simvastatin (ZOCOR) 20 MG tablet TAKE 1 TABLET BY MOUTH  DAILY 12/13/20   Malva Limes, MD    Allergies Augmentin [amoxicillin-pot clavulanate] and Quinapril-hydrochlorothiazide  Family History  Problem Relation Age of Onset  . Heart attack Mother   . Hypertension Mother   . Alzheimer's disease Father   . Diabetes Father   . Hypertension Sister   . Diabetes Brother   . Hypertension Sister   . Hypertension Sister   . Hypertension Sister   . Hypertension Sister   . Breast cancer Sister 69  . Breast cancer Maternal Aunt 70  . Breast cancer Maternal Aunt     Social History Social History   Tobacco Use  . Smoking status: Never Smoker  . Smokeless tobacco: Never Used  Vaping Use  . Vaping Use: Never used  Substance Use Topics  . Alcohol use: No    Alcohol/week: 0.0 standard drinks  . Drug use: No    Review of Systems  Constitutional: No fever/chills.  Positive for generalized gait change Eyes: No visual changes. ENT: No sore throat. Cardiovascular: Denies chest pain. Respiratory: Denies shortness of breath. Gastrointestinal: No abdominal pain.  No nausea, no vomiting.  No diarrhea.  No constipation. Genitourinary: Negative for dysuria. Musculoskeletal: Negative for back pain. Skin: Negative for rash. Neurological: Negative for headaches, focal  weakness or numbness.  ____________________________________________   PHYSICAL EXAM:  VITAL SIGNS: Vitals:   12/30/20 1800 12/30/20 1900  BP: (!) 125/107 (!) 132/98  Pulse: 72 72  Resp: 17 17  Temp:    SpO2: 99% 99%     Constitutional: Alert and oriented. Well appearing and in no acute distress. Eyes: Conjunctivae are normal. PERRL. EOMI. Head: Atraumatic. Nose: No congestion/rhinnorhea. Mouth/Throat: Mucous membranes are moist.  Oropharynx non-erythematous. Neck: No stridor. No cervical spine tenderness to palpation. Cardiovascular: Normal rate, regular rhythm. Grossly normal heart sounds.  Good peripheral circulation. Respiratory: Normal respiratory effort.  No retractions. Lungs CTAB. Gastrointestinal: Soft , nondistended, nontender to palpation. No CVA tenderness. Musculoskeletal: No lower extremity tenderness nor edema.  No joint effusions. No signs of acute trauma. Neurologic:  Normal speech and language. No gross focal neurologic deficits are appreciated. No gait instability noted. Cranial nerves II  through XII intact 5/5 strength and sensation in all 4 extremities Skin:  Skin is warm, dry and intact. No rash noted. Psychiatric: Mood and affect are normal. Speech and behavior are normal.  ____________________________________________   LABS (all labs ordered are listed, but only abnormal results are displayed)  Labs Reviewed  CBC WITH DIFFERENTIAL/PLATELET - Abnormal; Notable for the following components:      Result Value   RBC 3.59 (*)    MCV 100.8 (*)    All other components within normal limits  COMPREHENSIVE METABOLIC PANEL - Abnormal; Notable for the following components:   BUN 40 (*)    Creatinine, Ser 1.37 (*)    GFR, Estimated 41 (*)    All other components within normal limits  TROPONIN I (HIGH SENSITIVITY)   ____________________________________________  12 Lead EKG  Poor quality EKG with significant artifact demonstrates likely sinus rhythm,  rate of 77 bpm.  Normal axis and probable normal intervals.  No clear evidence of acute ischemia. ____________________________________________  RADIOLOGY  ED MD interpretation: CT head reviewed by me without evidence of acute intracranial pathology.  Official radiology report(s): CT Head Wo Contrast  Result Date: 12/30/2020 CLINICAL DATA:  Mental status change.  Dizziness. EXAM: CT HEAD WITHOUT CONTRAST TECHNIQUE: Contiguous axial images were obtained from the base of the skull through the vertex without intravenous contrast. COMPARISON:  CT head 02/05/2020. FINDINGS: Brain: Patchy and confluent areas of decreased attenuation are noted throughout the deep and periventricular white matter of the cerebral hemispheres bilaterally, compatible with chronic microvascular ischemic disease. No evidence of large-territorial acute infarction. No parenchymal hemorrhage. No mass lesion. No extra-axial collection. No mass effect or midline shift. No hydrocephalus. Basilar cisterns are patent. Vascular: No hyperdense vessel. Atherosclerotic calcifications are present within the cavernous internal carotid arteries. Skull: No acute fracture or focal lesion. Sinuses/Orbits: Paranasal sinuses and mastoid air cells are clear. The orbits are unremarkable. Other: None. IMPRESSION: No acute intracranial abnormality. Electronically Signed   By: Tish Frederickson M.D.   On: 12/30/2020 17:49    ____________________________________________   PROCEDURES and INTERVENTIONS  Procedure(s) performed (including Critical Care):  .1-3 Lead EKG Interpretation Performed by: Delton Prairie, MD Authorized by: Delton Prairie, MD     Interpretation: normal     ECG rate:  70   ECG rate assessment: normal     Rhythm: sinus rhythm     Ectopy: none     Conduction: normal      Medications - No data to display  ____________________________________________   MDM / ED COURSE   75 year old woman presents to the ED with isolated and  transient gait instability, without evidence of additional acute pathology, and amenable to outpatient management.  Normal vitals on room air.  Exam demonstrates no evidence of acute pathology.  She looks well without distress, neurovascular deficits or any trauma.  She is ambulatory here with a normal gait without her typical assistance device.  Blood work with CKD at baseline and otherwise unrevealing.  EKG is nonischemic with normal intervals.  CT head demonstrates no evidence of ICH or CVA.  I discussed with her the possibility of TIA causing her symptoms, even though she had no other focal neurologic deficits during her transient episode.  She reports that she feels fine and is requesting discharge.  We thoroughly discussed return precautions for the ED and patient stable for outpatient management.   Clinical Course as of 12/30/20 2118  Tue Dec 30, 2020  1755 Educated nurse for need for ambulation  trial.  Do not need repeat troponin. [DS]  [id number] Nurse informs me of ambulation trial that was well-tolerated without symptoms or gait change [DS]  1856 Reassessed.  Patient reports that she feels well and is requesting discharge.  We discussed the possibility of TIA.  We discussed return precautions for the ED [DS]    Clinical Course User Index [DS] Delton Prairie, MD    ____________________________________________   FINAL CLINICAL IMPRESSION(S) / ED DIAGNOSES  Final diagnoses:  Gait instability     ED Discharge Orders    None       Ryenn Howeth   Note:  This document was prepared using Dragon voice recognition software and may include unintentional dictation errors.   Delton Prairie, MD 12/30/20 2121

## 2020-12-30 NOTE — ED Notes (Signed)
Helped pt ambulate around bed in room. Pt ambulated in room with no dizziness. Gait was steady. Pt uses cane at home but was able to walk around room without impaired gait. EDP informed.

## 2020-12-30 NOTE — Telephone Encounter (Signed)
Pt. Reports after taking her medication this morning she began to "feel drunk. I can't hardly walk straight. I'm using my walker, but I'm afraid I'll fall." States she thought it is related to her Lasix. States she has poor balance and is weak. Instructed to call 911. Husband is with pt.  Reason for Disposition . [1] SEVERE weakness (i.e., unable to walk or barely able to walk, requires support) AND [2] new-onset or worsening  Answer Assessment - Initial Assessment Questions 1. NAME of MEDICATION: "What medicine are you calling about?"     Lasix 2. QUESTION: "What is your question?" (e.g., medication refill, side effect)     Lasix 3. PRESCRIBING HCP: "Who prescribed it?" Reason: if prescribed by specialist, call should be referred to that group.     Dr. Sherrie Mustache 4. SYMPTOMS: "Do you have any symptoms?"     Feels "drunk", imbalanced, having difficulty walking with walker 5. SEVERITY: If symptoms are present, ask "Are they mild, moderate or severe?"     Severe 6. PREGNANCY:  "Is there any chance that you are pregnant?" "When was your last menstrual period?"     No  Answer Assessment - Initial Assessment Questions 1. SYMPTOM: "What is the main symptom you are concerned about?" (e.g., weakness, numbness)     "I feel drunk.I can't walk straight." 2. ONSET: "When did this start?" (minutes, hours, days; while sleeping)     Today 3. LAST NORMAL: "When was the last time you were normal (no symptoms)?"     Yesterday 4. PATTERN "Does this come and go, or has it been constant since it started?"  "Is it present now?"     Present now 5. CARDIAC SYMPTOMS: "Have you had any of the following symptoms: chest pain, difficulty breathing, palpitations?"     No 6. NEUROLOGIC SYMPTOMS: "Have you had any of the following symptoms: headache, dizziness, vision loss, double vision, changes in speech, unsteady on your feet?"     Unsteady 7. OTHER SYMPTOMS: "Do you have any other symptoms?"     Weak 8. PREGNANCY:  "Is there any chance you are pregnant?" "When was your last menstrual period?"     No  Protocols used: NEUROLOGIC DEFICIT-A-AH, MEDICATION QUESTION CALL-A-AH

## 2021-01-01 ENCOUNTER — Emergency Department: Payer: Medicare Other

## 2021-01-01 ENCOUNTER — Other Ambulatory Visit: Payer: Self-pay

## 2021-01-01 ENCOUNTER — Encounter: Payer: Self-pay | Admitting: Emergency Medicine

## 2021-01-01 ENCOUNTER — Emergency Department
Admission: EM | Admit: 2021-01-01 | Discharge: 2021-01-01 | Disposition: A | Discharge status: Home / Self Care | Payer: Medicare Other | Attending: Emergency Medicine | Admitting: Emergency Medicine

## 2021-01-01 DIAGNOSIS — R42 Dizziness and giddiness: Secondary | ICD-10-CM | POA: Insufficient documentation

## 2021-01-01 DIAGNOSIS — R519 Headache, unspecified: Secondary | ICD-10-CM | POA: Diagnosis not present

## 2021-01-01 DIAGNOSIS — I499 Cardiac arrhythmia, unspecified: Secondary | ICD-10-CM | POA: Diagnosis not present

## 2021-01-01 DIAGNOSIS — Z79899 Other long term (current) drug therapy: Secondary | ICD-10-CM | POA: Diagnosis not present

## 2021-01-01 DIAGNOSIS — I10 Essential (primary) hypertension: Secondary | ICD-10-CM | POA: Insufficient documentation

## 2021-01-01 DIAGNOSIS — R404 Transient alteration of awareness: Secondary | ICD-10-CM | POA: Diagnosis not present

## 2021-01-01 DIAGNOSIS — Z7982 Long term (current) use of aspirin: Secondary | ICD-10-CM | POA: Diagnosis not present

## 2021-01-01 DIAGNOSIS — Z7902 Long term (current) use of antithrombotics/antiplatelets: Secondary | ICD-10-CM | POA: Diagnosis not present

## 2021-01-01 DIAGNOSIS — R0602 Shortness of breath: Secondary | ICD-10-CM | POA: Diagnosis not present

## 2021-01-01 DIAGNOSIS — Z743 Need for continuous supervision: Secondary | ICD-10-CM | POA: Diagnosis not present

## 2021-01-01 LAB — CBC WITH DIFFERENTIAL/PLATELET
Abs Immature Granulocytes: 0.02 10*3/uL (ref 0.00–0.07)
Basophils Absolute: 0 10*3/uL (ref 0.0–0.1)
Basophils Relative: 1 %
Eosinophils Absolute: 0.1 10*3/uL (ref 0.0–0.5)
Eosinophils Relative: 1 %
HCT: 35.6 % — ABNORMAL LOW (ref 36.0–46.0)
Hemoglobin: 12.2 g/dL (ref 12.0–15.0)
Immature Granulocytes: 0 %
Lymphocytes Relative: 28 %
Lymphs Abs: 1.8 10*3/uL (ref 0.7–4.0)
MCH: 33.5 pg (ref 26.0–34.0)
MCHC: 34.3 g/dL (ref 30.0–36.0)
MCV: 97.8 fL (ref 80.0–100.0)
Monocytes Absolute: 0.8 10*3/uL (ref 0.1–1.0)
Monocytes Relative: 12 %
Neutro Abs: 3.8 10*3/uL (ref 1.7–7.7)
Neutrophils Relative %: 58 %
Platelets: 210 10*3/uL (ref 150–400)
RBC: 3.64 MIL/uL — ABNORMAL LOW (ref 3.87–5.11)
RDW: 11.8 % (ref 11.5–15.5)
WBC: 6.5 10*3/uL (ref 4.0–10.5)
nRBC: 0 % (ref 0.0–0.2)

## 2021-01-01 LAB — URINALYSIS, COMPLETE (UACMP) WITH MICROSCOPIC
Bacteria, UA: NONE SEEN
Bilirubin Urine: NEGATIVE
Glucose, UA: NEGATIVE mg/dL
Hgb urine dipstick: NEGATIVE
Ketones, ur: NEGATIVE mg/dL
Leukocytes,Ua: NEGATIVE
Nitrite: NEGATIVE
Protein, ur: NEGATIVE mg/dL
Specific Gravity, Urine: 1.005 (ref 1.005–1.030)
pH: 6 (ref 5.0–8.0)

## 2021-01-01 LAB — TROPONIN I (HIGH SENSITIVITY)
Troponin I (High Sensitivity): 7 ng/L (ref <18)
Troponin I (High Sensitivity): 8 ng/L (ref <18)

## 2021-01-01 LAB — COMPREHENSIVE METABOLIC PANEL
ALT: 25 U/L (ref 0–44)
AST: 39 U/L (ref 15–41)
Albumin: 3.7 g/dL (ref 3.5–5.0)
Alkaline Phosphatase: 81 U/L (ref 38–126)
Anion gap: 12 (ref 5–15)
BUN: 29 mg/dL — ABNORMAL HIGH (ref 8–23)
CO2: 22 mmol/L (ref 22–32)
Calcium: 8.1 mg/dL — ABNORMAL LOW (ref 8.9–10.3)
Chloride: 100 mmol/L (ref 98–111)
Creatinine, Ser: 1.14 mg/dL — ABNORMAL HIGH (ref 0.44–1.00)
GFR, Estimated: 51 mL/min — ABNORMAL LOW (ref >60)
Glucose, Bld: 98 mg/dL (ref 70–99)
Potassium: 3.8 mmol/L (ref 3.5–5.1)
Sodium: 134 mmol/L — ABNORMAL LOW (ref 135–145)
Total Bilirubin: 1 mg/dL (ref 0.3–1.2)
Total Protein: 6.9 g/dL (ref 6.5–8.1)

## 2021-01-01 MED ORDER — IOHEXOL 350 MG/ML SOLN
75.0000 mL | Freq: Once | INTRAVENOUS | Status: AC | PRN
  Administered 2021-01-01: 75 mL via INTRAVENOUS

## 2021-01-01 MED ORDER — LORAZEPAM 2 MG/ML IJ SOLN
0.5000 mg | Freq: Once | INTRAMUSCULAR | Status: AC | PRN
  Administered 2021-01-01: 0.5 mg via INTRAVENOUS
  Filled 2021-01-01: qty 1

## 2021-01-01 NOTE — ED Notes (Signed)
Dr Sunday Plan aware of difficulty obtaining IV

## 2021-01-01 NOTE — ED Notes (Signed)
Pt assisted back to bed from restroom, steady gait.

## 2021-01-01 NOTE — ED Notes (Signed)
Pt up to assess dizziness. Pt stated she always feels a bit dizzy after getting up but pt was able to ambulate with cane and no dizziness at this time. Dr Hardman Plan was also able to observe pt. Pt stated she would like to go ahead and get the MRI.

## 2021-01-01 NOTE — ED Notes (Signed)
Pt assisted to use toilet in room

## 2021-01-01 NOTE — ED Provider Notes (Signed)
[facility] Emergency Department Provider Note  ____________________________________________   Event Date/Time   First MD Initiated Contact with Patient 01/01/21 [phone]     (approximate)  I have reviewed the triage vital signs and the nursing notes.   HISTORY  Chief Complaint Headache and Dizziness    HPI Chrisette Man is a 75 y.o. female with hypertension, hyperlipidemia, vertigo, stroke who comes in with dizziness.  Patient reports having a headache and dizziness.  Patient states that she went home after her ER visit 2 days ago and she took her pills last night.  About 30 to 45 minutes later she states that she had a choking episode where she felt like she could not breathe and felt like she was going to die.  She states that she does not remember choking on anything in particular but wonders if she could have this choked on her medications that she took earlier.  She said after this episode she started having a headache, severe, behind her eyes, better on its own, brought upon by the choking episode.  When she had the choking episode she states that she could not catch her breath as well.  She was told that if she develops a headache when she was at her last ER visit that she should return to the ER.  She states that the headache was pretty severe when it started after the choking episode but is now gone.  She denies any dizziness at this time.  She states that she just feels a little off with pain but she states that that is baseline for her.  She ambulates with a cane.  On review of records patient was seen 2 days ago with "drunken walking".  She states that she had not been drinking but just felt that she had difficulties ambulating.  Patient is CT head that was negative and patient's symptoms had resolved therefore patient was discharged home          Past Medical History:  Diagnosis Date  . History of diverticulitis of colon 05/20/2015   per patient report    . Hyperlipidemia   . Hypertension   . Stroke (HCC)   . TIA (transient ischemic attack)   . Vertigo     Patient Active Problem List   Diagnosis Date Noted  . Vitamin D deficiency 09/21/2020  . Low back pain 05/20/2015  . Fatigue 05/20/2015  . History of CVA (cerebrovascular accident) 05/20/2015  . Insomnia 05/20/2015  . Morbid obesity (HCC) 05/20/2015  . Positive H. pylori test 05/20/2015  . Tremor 05/20/2015  . Dysfunction of eustachian tube 05/20/2015  . Diverticulosis 05/20/2015  . Essential (primary) hypertension 07/17/2007  . Alterations of sensations, late effect of cerebrovascular disease 03/11/2005  . Hyperlipidemia 10/12/2003  . Trigeminal neuralgia 10/12/2003    Past Surgical History:  Procedure Laterality Date  . CHOLECYSTECTOMY  2000  . HERNIA REPAIR     umbilical hernia as a child  . Pseudophakia OS Left 02/12/2020   Dr. Clydene Pugh  . REPLACEMENT TOTAL KNEE Bilateral     Prior to Admission medications   Medication Sig Start Date End Date Taking? Authorizing Provider  Acetaminophen (TYLENOL EX ST ARTHRITIS PAIN PO) Take 2 tablets by mouth daily as needed (for arthritis pain).    [provider]  ALPRAZolam [name]) 0.25 MG tablet Take 0.5-1 tablets (0.125-0.25 mg total) by mouth at bedtime as needed for sleep. 09/19/20   Malva Limes, MD  alum & mag hydroxide-simeth (  MAALOX MAX) 400-400-40 MG/5ML suspension Take 5 mLs by mouth every 6 (six) hours as needed for indigestion. 11/07/19   Nita Sickle, MD  aspirin 81 MG tablet Take 1 tablet by mouth daily.    [provider]  carbamazepine (TEGRETOL) 200 MG tablet TAKE 1 TABLET BY MOUTH TWICE DAILY 08/27/20   Malva Limes, MD  clopidogrel (PLAVIX) 75 MG tablet Take 1 tablet (75 mg total) by mouth daily. 06/27/20   Malva Limes, MD  cyanocobalamin 1000 MCG tablet Take 1,000 mcg by mouth daily.    [provider]  diltiazem (DILT-XR) 180 MG 24 hr capsule TAKE 2 CAPSULES BY  MOUTH  DAILY 04/26/20   Malva Limes, MD  docusate sodium (COLACE) 100 MG capsule Take 100 mg by mouth 2 (two) times daily as needed for mild constipation.    [provider]  famotidine (PEPCID) 40 MG tablet Take 1 tablet (40 mg total) by mouth at bedtime. 06/27/20 06/27/21  Malva Limes, MD  fluticasone (FLONASE) 50 MCG/ACT nasal spray Place 2 sprays into both nostrils daily. 09/19/20   Malva Limes, MD  furosemide (LASIX) 20 MG tablet Take 1 tablet (20 mg total) by mouth daily as needed for edema. 09/19/20   Malva Limes, MD  IRON PO Take 1 tablet by mouth daily. Patient not taking: No sig reported    [provider]  lidocaine (LIDODERM) 5 % Place 1 patch onto the skin daily. Remove & Discard patch within 12 hours or as directed by MD Patient not taking: No sig reported 02/05/20   Enid Derry, PA-C  nortriptyline (PAMELOR) 10 MG capsule Take 1-2 capsules (10-20 mg total) by mouth at bedtime. 05/12/20   Malva Limes, MD  polyethylene glycol (MIRALAX / GLYCOLAX) 17 g packet Take 17 g by mouth daily as needed.    [provider]  psyllium (METAMUCIL) 58.6 % packet Take 1 packet by mouth daily.     [provider]  simvastatin (ZOCOR) 20 MG tablet TAKE 1 TABLET BY MOUTH  DAILY 12/13/20   Malva Limes, MD    Allergies Augmentin [amoxicillin-pot clavulanate] and Quinapril-hydrochlorothiazide  Family History  Problem Relation Age of Onset  . Heart attack Mother   . Hypertension Mother   . Alzheimer's disease Father   . Diabetes Father   . Hypertension Sister   . Diabetes Brother   . Hypertension Sister   . Hypertension Sister   . Hypertension Sister   . Hypertension Sister   . Breast cancer Sister 109  . Breast cancer Maternal Aunt 70  . Breast cancer Maternal Aunt     Social History Social History   Tobacco Use  . Smoking status: Never Smoker  . Smokeless tobacco: Never Used  Vaping Use  . Vaping Use: Never used  Substance  Use Topics  . Alcohol use: No    Alcohol/week: 0.0 standard drinks  . Drug use: No      Review of Systems Constitutional: No fever/chills Eyes: No visual changes. ENT: No sore throat. Cardiovascular: Denies chest pain. Respiratory: Denies shortness of breath.  Choking episode Gastrointestinal: No abdominal pain.  No nausea, no vomiting.  No diarrhea.  No constipation. Genitourinary: Negative for dysuria. Musculoskeletal: Negative for back pain. Skin: Negative for rash. Neurological: Positive headache, + intermittent dizziness All other ROS negative ____________________________________________   PHYSICAL EXAM:  VITAL SIGNS: ED Triage Vitals [01/01/21 0635]  Enc Vitals Group     BP 131/70  Pulse Rate 96     Resp 20     Temp 98.6 F (37 C)     Temp Source Oral     SpO2 98 %     Weight 208 lb (94.3 kg)     Height 5' (1.524 m)     Head Circumference      Peak Flow      Pain Score 0     Pain Loc      Pain Edu?      Excl. in GC?     Constitutional: Alert and oriented. Well appearing and in no acute distress. Eyes: Conjunctivae are normal. EOMI. Head: Atraumatic. Nose: No congestion/rhinnorhea. Mouth/Throat: Mucous membranes are moist.   Neck: No stridor. Trachea Midline. FROM Cardiovascular: Normal rate, regular rhythm. Grossly normal heart sounds.  Good peripheral circulation. Respiratory: Normal respiratory effort.  No retractions. Lungs CTAB. Gastrointestinal: Soft and nontender. No distention. No abdominal bruits.  Musculoskeletal: No lower extremity tenderness nor edema.  No joint effusions. Neurologic:  Normal speech and language. No gross focal neurologic deficits are appreciated.  Cranial nerves II through XII are intact.  Equal strength in arms and legs.  Sensation intact throughout.  Finger-to-nose intact Skin:  Skin is warm, dry and intact. No rash noted. Psychiatric: Mood and affect are normal. Speech and behavior are normal. GU: Deferred    ____________________________________________   LABS (all labs ordered are listed, but only abnormal results are displayed)  Labs Reviewed  CBC WITH DIFFERENTIAL/PLATELET - Abnormal; Notable for the following components:      Result Value   RBC 3.64 (*)    HCT 35.6 (*)    All other components within normal limits  URINALYSIS, COMPLETE (UACMP) WITH MICROSCOPIC - Abnormal; Notable for the following components:   Color, Urine STRAW (*)    APPearance CLEAR (*)    All other components within normal limits  COMPREHENSIVE METABOLIC PANEL - Abnormal; Notable for the following components:   Sodium 134 (*)    BUN 29 (*)    Creatinine, Ser 1.14 (*)    Calcium 8.1 (*)    GFR, Estimated 51 (*)    All other components within normal limits  TROPONIN I (HIGH SENSITIVITY)  TROPONIN I (HIGH SENSITIVITY)   ____________________________________________   ED ECG REPORT I, Concha SeMary E Maclovio Henson, the attending physician, personally viewed and interpreted this ECG.  EKG is sinus rate of 84, no ST elevation, no T wave inversions, grossly normal intervals although a little bit of artifact ____________________________________________  RADIOLOGY   Official radiology report(s): CT Angio Head W or Wo Contrast  Result Date: 01/01/2021 CLINICAL DATA:  Dizziness, nonspecific. EXAM: CT ANGIOGRAPHY HEAD AND NECK TECHNIQUE: Multidetector CT imaging of the head and neck was performed using the standard protocol during bolus administration of intravenous contrast. Multiplanar CT image reconstructions and MIPs were obtained to evaluate the vascular anatomy. Carotid stenosis measurements (when applicable) are obtained utilizing NASCET criteria, using the distal internal carotid diameter as the denominator. CONTRAST:  75mL OMNIPAQUE IOHEXOL 350 MG/ML SOLN COMPARISON:  Head CT February 05, 2020. FINDINGS: CT HEAD FINDINGS Brain: No evidence of acute infarction, hemorrhage, hydrocephalus, extra-axial collection or mass  lesion/mass effect. Confluent hypodensity of the white matter of the cerebral hemispheres, nonspecific, most likely related to chronic microangiopathic changes. Vascular: No hyperdense vessel or unexpected calcification. Skull: Normal. Negative for fracture or focal lesion. Sinuses: Imaged portions are clear. Orbits: No acute finding. Review of the MIP images confirms the above findings CTA NECK FINDINGS  Aortic arch: Common origin of the innominate and left common carotid artery from the aortic arch. Imaged portion shows no evidence of aneurysm or dissection. Calcified atherosclerotic plaques in the aortic arch. No significant stenosis of the major arch vessel origins. Increased tortuosity of the major neck arteries may be related to longstanding hypertension. Right carotid system: No evidence of dissection, stenosis (50% or greater) or occlusion. Left carotid system: No evidence of dissection, stenosis (50% or greater) or occlusion. Vertebral arteries: No evidence of dissection, stenosis (50% or greater) or occlusion. Skeleton: Degenerative changes of the cervical spine, right temporomandibular joint and bilateral sternoclavicular joints. Other neck: Negative. Upper chest: Negative. Review of the MIP images confirms the above findings CTA HEAD FINDINGS Anterior circulation: No significant stenosis, proximal occlusion, aneurysm, or vascular malformation. Posterior circulation: No significant stenosis, proximal occlusion, aneurysm, or vascular malformation. Venous sinuses: As permitted by contrast timing, patent. Anatomic variants: Hypoplastic/aplastic A1/ACA segment with prominent left A1 segment supplying the bilateral A2 segments. Review of the MIP images confirms the above findings IMPRESSION: 1. No acute intracranial abnormality. 2. No large vessel occlusion, hemodynamically significant stenosis, or evidence of dissection. 3. Aortic atherosclerosis. Aortic Atherosclerosis (ICD10-I70.0). Electronically Signed    By: Baldemar Lenis M.D.   On: 01/01/2021 10:43   DG Chest 2 View  Result Date: 01/01/2021 CLINICAL DATA:  Shortness of breath EXAM: CHEST - 2 VIEW COMPARISON:  02/05/2020 FINDINGS: Cardiac shadow is within normal limits. Mild aortic calcifications are seen. The lungs are clear bilaterally. No acute bony abnormality is seen. Changes consistent with prior shoulder replacement on the left are noted. IMPRESSION: No acute abnormality noted. Electronically Signed   By: Alcide Clever M.D.   On: 01/01/2021 08:04   CT Angio Neck W and/or Wo Contrast  Result Date: 01/01/2021 CLINICAL DATA:  Dizziness, nonspecific. EXAM: CT ANGIOGRAPHY HEAD AND NECK TECHNIQUE: Multidetector CT imaging of the head and neck was performed using the standard protocol during bolus administration of intravenous contrast. Multiplanar CT image reconstructions and MIPs were obtained to evaluate the vascular anatomy. Carotid stenosis measurements (when applicable) are obtained utilizing NASCET criteria, using the distal internal carotid diameter as the denominator. CONTRAST:  75mL OMNIPAQUE IOHEXOL 350 MG/ML SOLN COMPARISON:  Head CT February 05, 2020. FINDINGS: CT HEAD FINDINGS Brain: No evidence of acute infarction, hemorrhage, hydrocephalus, extra-axial collection or mass lesion/mass effect. Confluent hypodensity of the white matter of the cerebral hemispheres, nonspecific, most likely related to chronic microangiopathic changes. Vascular: No hyperdense vessel or unexpected calcification. Skull: Normal. Negative for fracture or focal lesion. Sinuses: Imaged portions are clear. Orbits: No acute finding. Review of the MIP images confirms the above findings CTA NECK FINDINGS Aortic arch: Common origin of the innominate and left common carotid artery from the aortic arch. Imaged portion shows no evidence of aneurysm or dissection. Calcified atherosclerotic plaques in the aortic arch. No significant stenosis of the major arch vessel  origins. Increased tortuosity of the major neck arteries may be related to longstanding hypertension. Right carotid system: No evidence of dissection, stenosis (50% or greater) or occlusion. Left carotid system: No evidence of dissection, stenosis (50% or greater) or occlusion. Vertebral arteries: No evidence of dissection, stenosis (50% or greater) or occlusion. Skeleton: Degenerative changes of the cervical spine, right temporomandibular joint and bilateral sternoclavicular joints. Other neck: Negative. Upper chest: Negative. Review of the MIP images confirms the above findings CTA HEAD FINDINGS Anterior circulation: No significant stenosis, proximal occlusion, aneurysm, or vascular malformation. Posterior circulation: No significant  stenosis, proximal occlusion, aneurysm, or vascular malformation. Venous sinuses: As permitted by contrast timing, patent. Anatomic variants: Hypoplastic/aplastic A1/ACA segment with prominent left A1 segment supplying the bilateral A2 segments. Review of the MIP images confirms the above findings IMPRESSION: 1. No acute intracranial abnormality. 2. No large vessel occlusion, hemodynamically significant stenosis, or evidence of dissection. 3. Aortic atherosclerosis. Aortic Atherosclerosis (ICD10-I70.0). Electronically Signed   By: Baldemar Lenis M.D.   On: 01/01/2021 10:43    ____________________________________________   PROCEDURES  Procedure(s) performed (including Critical Care):  .1-3 Lead EKG Interpretation Performed by: Concha Se, MD Authorized by: Concha Se, MD     ECG rate:  80s   ECG rate assessment: normal     Rhythm: sinus rhythm     Ectopy: none     Conduction: normal       ____________________________________________   INITIAL IMPRESSION / ASSESSMENT AND PLAN / ED COURSE  Damonie Furney was evaluated in Emergency Department on 01/01/2021 for the symptoms described in the history of present illness. She was evaluated in the  context of the global COVID-19 pandemic, which necessitated consideration that the patient might be at risk for infection with the SARS-CoV-2 virus that causes COVID-19. Institutional protocols and algorithms that pertain to the evaluation of patients at risk for COVID-19 are in a state of rapid change based on information released by regulatory bodies including the CDC and federal and state organizations. These policies and algorithms were followed during the patient's care in the ED.    Patient is a 75 year old who comes in with episode of headache in relation with her dizziness status post a choking episode.  At this time patient denies any headache and denies needing any pain medication.  Patient does have a history of vertigo consider the possibility of posterior stroke although seems less likely.  Given if patient is presenting again for similar concerns will get CTA head and neck to further evaluate for stroke, bleed, dissection.  Patient may need MRI for more definitive rule out of stroke but patient states that she is got severe claustrophobia and at this time would like to try to hold off.  Will get EKG keep patient on the monitor to evaluate for arrhythmia and get basic labs and cardiac markers to evaluate for ACS.  Given the choking episode patient denies any current chest pain or shortness of breath but will get chest x-ray to evaluate for aspiration or pneumothorax.   Chest x-ray is negative, cardiac markers are negative.  No significant electrolyte abnormalities  11:09 AM reevaluated patient.  Patient states that she is feeling her normal self at this time.  She denies really any dizziness and states that she walked 5 but daughter stated that she seemed a little wobbly.  We discussed that the CTA was negative but there can still be a chance for having a stroke that would require MRI.  Patient is claustrophobic and really does not want to have the MRI.  I offered anxiety medication to help  facilitate.  Patient is going to think about it with her daughter.  Patient willing to have the MRI done.  Patient be handed off to oncoming team pending MRI.  If negative patient can be discharged home given reassuring work-up.  No evidence of ACS or other causes of her dizziness and headache.  Suspect that this is more likely related to vertigo       ____________________________________________   FINAL CLINICAL IMPRESSION(S) / ED DIAGNOSES  Final diagnoses:  Dizziness      MEDICATIONS GIVEN DURING THIS VISIT:  Medications  LORazepam (ATIVAN) injection 0.5 mg (has no administration in time range)  iohexol (OMNIPAQUE) 350 MG/ML injection 75 mL (75 mLs Intravenous Contrast Given 01/01/21 1007)     ED Discharge Orders    None       Note:  This document was prepared using Dragon voice recognition software and may include unintentional dictation errors.   Concha Se, MD 01/01/21 [phone]

## 2021-01-01 NOTE — ED Notes (Signed)
Pt ambulatory to bedside commode, steady gait.

## 2021-01-01 NOTE — ED Triage Notes (Signed)
Pt to ED via EMS from home c/o headache and dizziness when she walks.  Pt states was here yesterday for dizziness but it's different.  Pt denies pain at this time, A&Ox4, chest rise even and unlabored, in NAD at this time.

## 2021-01-01 NOTE — ED Notes (Signed)
This RN looked for 20G IV with no success

## 2021-01-01 NOTE — ED Notes (Signed)
Bill, RN unable to obtain IV with [place]

## 2021-01-01 NOTE — ED Notes (Signed)
CT made aware of 20G IV

## 2021-01-01 NOTE — ED Provider Notes (Signed)
-----------------------------------------   4:39 PM on 01/01/2021 -----------------------------------------  Patient's MRI is negative.  Discussed results with the patient and daughter who are reassured.  Patient appears quite well.  We will discharge patient home with PCP follow-up.   Minna Antis, MD 01/01/21 [phone]

## 2021-01-01 NOTE — ED Notes (Signed)
IV team at bedside 

## 2021-01-02 ENCOUNTER — Other Ambulatory Visit: Payer: Self-pay | Admitting: Family Medicine

## 2021-01-02 DIAGNOSIS — G4700 Insomnia, unspecified: Secondary | ICD-10-CM

## 2021-01-02 NOTE — Telephone Encounter (Signed)
Requested medication (s) are due for refill today: no  Requested medication (s) are on the active medication list: yes  Last refill:  12/16/2020  Future visit scheduled: no  Notes to clinic:  this refill cannot be delegated    Requested Prescriptions  Pending Prescriptions Disp Refills   ALPRAZolam (XANAX) 0.25 MG tablet [Pharmacy Med Name: ALPRAZOLAM  0.25MG   TAB] 90 tablet     Sig: TAKE 1/2 TO 1 TABLET BY  MOUTH AT BEDTIME AS NEEDED  FOR SLEEP      Not Delegated - Psychiatry:  Anxiolytics/Hypnotics Failed - 01/02/2021 10:00 AM      Failed - This refill cannot be delegated      Failed - Urine Drug Screen completed in last 360 days      Passed - Valid encounter within last 6 months    Recent Outpatient Visits           3 months ago Essential (primary) hypertension   [facility] Malva Limes, MD   6 months ago History of CVA (cerebrovascular accident)   [facility] Malva Limes, MD   9 months ago Essential (primary) hypertension   [facility], Demetrios Isaacs, MD   1 year ago Gastroesophageal reflux disease without esophagitis   [facility], MD   1 year ago Morbid obesity [facility])   [facility] Malva Limes, MD                 Signed Prescriptions Disp Refills   diltiazem (DILT-XR) 180 MG 24 hr capsule 180 capsule 0    Sig: TAKE 2 CAPSULES BY MOUTH  DAILY      Cardiovascular:  Calcium Channel Blockers Failed - 01/02/2021 10:00 AM      Failed - Last BP in normal range    BP Readings from Last 1 Encounters:  01/01/21 (!) 141/73          Passed - Valid encounter within last 6 months    Recent Outpatient Visits           3 months ago Essential (primary) hypertension   [facility] Malva Limes, MD   6 months ago History of CVA (cerebrovascular accident)   [facility] Malva Limes, MD   9 months ago Essential (primary)  hypertension   [facility], Demetrios Isaacs, MD   1 year ago Gastroesophageal reflux disease without esophagitis   [facility], Demetrios Isaacs, MD   1 year ago Morbid obesity [facility])   [facility] Sherrie Mustache, Demetrios Isaacs, MD

## 2021-01-02 NOTE — Telephone Encounter (Signed)
[facility] faxed refill request for the following medications:  carbamazepine (TEGRETOL) 200 MG tablet  Please advise. Thanks TNP

## 2021-01-02 NOTE — Telephone Encounter (Signed)
Please review. Thanks!  

## 2021-01-05 DIAGNOSIS — M545 Low back pain, unspecified: Secondary | ICD-10-CM | POA: Diagnosis not present

## 2021-01-06 ENCOUNTER — Telehealth: Payer: Self-pay | Admitting: Family Medicine

## 2021-01-06 MED ORDER — CARBAMAZEPINE 200 MG PO TABS: 200.0000 mg | ORAL_TABLET | Freq: Two times a day (BID) | ORAL | 4 refills | Status: DC

## 2021-01-06 NOTE — Telephone Encounter (Signed)
[facility] faxed refill request for the following medications:  carbamazepine (TEGRETOL) 200 MG tablet  Last Rx: 08/27/20 LOV: 09/19/20 Please advise. Thanks TNP

## 2021-01-12 ENCOUNTER — Other Ambulatory Visit: Payer: Self-pay

## 2021-01-12 ENCOUNTER — Encounter (INDEPENDENT_AMBULATORY_CARE_PROVIDER_SITE_OTHER): Payer: Medicare Other | Admitting: Ophthalmology

## 2021-01-12 DIAGNOSIS — H35033 Hypertensive retinopathy, bilateral: Secondary | ICD-10-CM

## 2021-01-12 DIAGNOSIS — I10 Essential (primary) hypertension: Secondary | ICD-10-CM | POA: Diagnosis not present

## 2021-01-12 DIAGNOSIS — H59032 Cystoid macular edema following cataract surgery, left eye: Secondary | ICD-10-CM

## 2021-01-12 DIAGNOSIS — H43813 Vitreous degeneration, bilateral: Secondary | ICD-10-CM

## 2021-02-16 ENCOUNTER — Ambulatory Visit: Payer: Medicare Other | Admitting: Family Medicine

## 2021-02-23 ENCOUNTER — Other Ambulatory Visit: Payer: Self-pay

## 2021-02-23 ENCOUNTER — Encounter (INDEPENDENT_AMBULATORY_CARE_PROVIDER_SITE_OTHER): Payer: Medicare Other | Admitting: Ophthalmology

## 2021-02-23 DIAGNOSIS — H35033 Hypertensive retinopathy, bilateral: Secondary | ICD-10-CM

## 2021-02-23 DIAGNOSIS — I10 Essential (primary) hypertension: Secondary | ICD-10-CM | POA: Diagnosis not present

## 2021-02-23 DIAGNOSIS — H43813 Vitreous degeneration, bilateral: Secondary | ICD-10-CM | POA: Diagnosis not present

## 2021-02-23 DIAGNOSIS — H59032 Cystoid macular edema following cataract surgery, left eye: Secondary | ICD-10-CM | POA: Diagnosis not present

## 2021-03-01 ENCOUNTER — Other Ambulatory Visit: Payer: Self-pay | Admitting: Family Medicine

## 2021-03-17 ENCOUNTER — Ambulatory Visit (INDEPENDENT_AMBULATORY_CARE_PROVIDER_SITE_OTHER): Payer: Medicare Other | Admitting: Family Medicine

## 2021-03-17 ENCOUNTER — Other Ambulatory Visit: Payer: Self-pay

## 2021-03-17 ENCOUNTER — Encounter: Payer: Self-pay | Admitting: Family Medicine

## 2021-03-17 VITALS — BP 113/70 | HR 91 | Temp 97.3°F | Resp 18 | Wt 218.0 lb

## 2021-03-17 DIAGNOSIS — R400 Somnolence: Secondary | ICD-10-CM

## 2021-03-17 DIAGNOSIS — L80 Vitiligo: Secondary | ICD-10-CM

## 2021-03-17 DIAGNOSIS — I10 Essential (primary) hypertension: Secondary | ICD-10-CM

## 2021-03-17 DIAGNOSIS — E559 Vitamin D deficiency, unspecified: Secondary | ICD-10-CM | POA: Diagnosis not present

## 2021-03-17 NOTE — Progress Notes (Signed)
Established patient visit   Patient: Amy Ray   DOB: [birth year]   75 y.o. Female  MRN: [id number] Visit Date: 03/17/2021  Today's healthcare provider: Mila Merry, MD   Chief Complaint  Patient presents with  . Skin Problem   Subjective    HPI  Skin color changes: Patient is here for an evaluation of skin color change. She reports having white blotchy spots on her arms, legs and abdomen. She denies any itching or pain. Skin color change started 6 weeks ago and is unchanged.   She is also here to follow up on vitamin D deficiency Lab Results  Component Value Date   VD25OH 13.4 (L) 09/19/2020   Was started on 2000 units D3 at that time.   Also due for hypertension follow up. Is doing well with current medication. No chest pains, palpitations, or dyspnea.      Medications: Outpatient Medications Prior to Visit  Medication Sig  . Acetaminophen (TYLENOL EX ST ARTHRITIS PAIN PO) Take 2 tablets by mouth daily as needed (for arthritis pain).  Marland Kitchen ALPRAZolam (XANAX) 0.25 MG tablet TAKE 1/2 TO 1 TABLET BY  MOUTH AT BEDTIME AS NEEDED  FOR SLEEP  . alum & mag hydroxide-simeth (MAALOX MAX) 400-400-40 MG/5ML suspension Take 5 mLs by mouth every 6 (six) hours as needed for indigestion.  Marland Kitchen aspirin 81 MG tablet Take 1 tablet by mouth daily.  . clopidogrel (PLAVIX) 75 MG tablet Take 1 tablet (75 mg total) by mouth daily.  . cyanocobalamin 1000 MCG tablet Take 1,000 mcg by mouth daily.  Marland Kitchen diltiazem (DILT-XR) 180 MG 24 hr capsule TAKE 2 CAPSULES BY MOUTH  DAILY  . famotidine (PEPCID) 40 MG tablet Take 1 tablet (40 mg total) by mouth at bedtime.  . fluticasone (FLONASE) 50 MCG/ACT nasal spray Place 2 sprays into both nostrils daily.  . furosemide (LASIX) 20 MG tablet Take 1 tablet (20 mg total) by mouth daily as needed for edema.  . IRON PO Take 1 tablet by mouth daily.  Marland Kitchen lidocaine (LIDODERM) 5 % Place 1 patch onto the skin daily. Remove & Discard patch within 12 hours or as  directed by MD  . nortriptyline (PAMELOR) 10 MG capsule Take 1-2 capsules (10-20 mg total) by mouth at bedtime.  . polyethylene glycol (MIRALAX / GLYCOLAX) 17 g packet Take 17 g by mouth daily as needed.  . psyllium (METAMUCIL) 58.6 % packet Take 1 packet by mouth daily.   . simvastatin (ZOCOR) 20 MG tablet TAKE 1 TABLET BY MOUTH  DAILY  . carbamazepine (TEGRETOL) 200 MG tablet Take 1 tablet (200 mg total) by mouth 2 (two) times daily. (Patient not taking: Reported on 03/17/2021)  . docusate sodium (COLACE) 100 MG capsule Take 100 mg by mouth 2 (two) times daily as needed for mild constipation. (Patient not taking: Reported on 03/17/2021)   No facility-administered medications prior to visit.    Review of Systems  Constitutional: Negative for appetite change, chills, fatigue and fever.  Respiratory: Negative for chest tightness and shortness of breath.   Cardiovascular: Negative for chest pain and palpitations.  Gastrointestinal: Negative for abdominal pain, nausea and vomiting.  Skin: Positive for color change and rash.  Neurological: Negative for dizziness and weakness.       Objective    BP 113/70 (BP Location: Left Arm, Patient Position: Sitting, Cuff Size: Large)   Pulse 91   Temp (!) 97.3 F (36.3 C) (Temporal)   Resp 18   Wt  218 lb (98.9 kg)   BMI 42.58 kg/m     Physical Exam   General: Appearance:    Obese female in no acute distress  Eyes:    PERRL, conjunctiva/corneas clear, EOM's intact       Lungs:     Clear to auscultation bilaterally, respirations unlabored  Heart:    Normal heart rate. Normal rhythm. No murmurs, rubs, or gallops.   Skin:   Multiple small approx 1.83mm round non-pigmented areas of skin on extremities and trunk. Not raised. No erythema.   Neurologic:   Awake, alert, oriented x 3. No apparent focal neurological           defect.         Assessment & Plan     1. Essential (primary) hypertension Well controlled.  Continue current medications.   -  Renal function panel  Follow up hypertension 6 months.   2. Vitamin D deficiency Taking vitamin D supplements consistently.  - VITAMIN D 25 Hydroxy (Vit-D Deficiency, Fractures)  3. Vitiligo Tiny scattered lesions, not at all suspicious for versicolor. Are not bothersome to here. Given reassurance of benign nature of areas.   4. Sleepiness She states she has had sleep study in the past. Sleeps well at night. - CBC - TSH   No follow-ups on file.      The entirety of the information documented in the History of Present Illness, Review of Systems and Physical Exam were personally obtained by me. Portions of this information were initially documented by the CMA and reviewed by me for thoroughness and accuracy.      Mila Merry, MD  [facility] [phone] (phone) [phone] (fax)  [facility]

## 2021-03-18 LAB — RENAL FUNCTION PANEL
Albumin: 4.2 g/dL (ref 3.7–4.7)
BUN/Creatinine Ratio: 23 (ref 12–28)
BUN: 31 mg/dL — ABNORMAL HIGH (ref 8–27)
CO2: 23 mmol/L (ref 20–29)
Calcium: 9.2 mg/dL (ref 8.7–10.3)
Chloride: 105 mmol/L (ref 96–106)
Creatinine, Ser: 1.35 mg/dL — ABNORMAL HIGH (ref 0.57–1.00)
Glucose: 90 mg/dL (ref 65–99)
Phosphorus: 3.3 mg/dL (ref 3.0–4.3)
Potassium: 3.7 mmol/L (ref 3.5–5.2)
Sodium: 143 mmol/L (ref 134–144)
eGFR: 41 mL/min/{1.73_m2} — ABNORMAL LOW (ref >59)

## 2021-03-18 LAB — CBC
Hematocrit: 37 % (ref 34.0–46.6)
Hemoglobin: 12.5 g/dL (ref 11.1–15.9)
MCH: 34 pg — ABNORMAL HIGH (ref 26.6–33.0)
MCHC: 33.8 g/dL (ref 31.5–35.7)
MCV: 101 fL — ABNORMAL HIGH (ref 79–97)
Platelets: 213 10*3/uL (ref 150–450)
RBC: 3.68 x10E6/uL — ABNORMAL LOW (ref 3.77–5.28)
RDW: 12.1 % (ref 11.7–15.4)
WBC: 5 10*3/uL (ref 3.4–10.8)

## 2021-03-18 LAB — VITAMIN D 25 HYDROXY (VIT D DEFICIENCY, FRACTURES): Vit D, 25-Hydroxy: 43 ng/mL (ref 30.0–100.0)

## 2021-03-18 LAB — TSH: TSH: 1.14 u[IU]/mL (ref 0.450–4.500)

## 2021-03-19 ENCOUNTER — Other Ambulatory Visit: Payer: Self-pay

## 2021-03-19 ENCOUNTER — Encounter: Payer: Self-pay | Admitting: Emergency Medicine

## 2021-03-19 ENCOUNTER — Emergency Department
Admission: EM | Admit: 2021-03-19 | Discharge: 2021-03-19 | Disposition: A | Discharge status: Home / Self Care | Payer: Medicare Other | Attending: Emergency Medicine | Admitting: Emergency Medicine

## 2021-03-19 ENCOUNTER — Emergency Department: Payer: Medicare Other

## 2021-03-19 DIAGNOSIS — Z7982 Long term (current) use of aspirin: Secondary | ICD-10-CM | POA: Insufficient documentation

## 2021-03-19 DIAGNOSIS — Z79899 Other long term (current) drug therapy: Secondary | ICD-10-CM | POA: Insufficient documentation

## 2021-03-19 DIAGNOSIS — Z8673 Personal history of transient ischemic attack (TIA), and cerebral infarction without residual deficits: Secondary | ICD-10-CM | POA: Diagnosis not present

## 2021-03-19 DIAGNOSIS — Z7902 Long term (current) use of antithrombotics/antiplatelets: Secondary | ICD-10-CM | POA: Insufficient documentation

## 2021-03-19 DIAGNOSIS — R519 Headache, unspecified: Secondary | ICD-10-CM | POA: Diagnosis not present

## 2021-03-19 DIAGNOSIS — Z743 Need for continuous supervision: Secondary | ICD-10-CM | POA: Diagnosis not present

## 2021-03-19 DIAGNOSIS — G4489 Other headache syndrome: Secondary | ICD-10-CM | POA: Diagnosis not present

## 2021-03-19 DIAGNOSIS — I10 Essential (primary) hypertension: Secondary | ICD-10-CM | POA: Diagnosis not present

## 2021-03-19 LAB — BASIC METABOLIC PANEL
Anion gap: 8 (ref 5–15)
BUN: 21 mg/dL (ref 8–23)
CO2: 24 mmol/L (ref 22–32)
Calcium: 8.7 mg/dL — ABNORMAL LOW (ref 8.9–10.3)
Chloride: 105 mmol/L (ref 98–111)
Creatinine, Ser: 1 mg/dL (ref 0.44–1.00)
GFR, Estimated: 59 mL/min — ABNORMAL LOW (ref >60)
Glucose, Bld: 88 mg/dL (ref 70–99)
Potassium: 3.1 mmol/L — ABNORMAL LOW (ref 3.5–5.1)
Sodium: 137 mmol/L (ref 135–145)

## 2021-03-19 LAB — CBC WITH DIFFERENTIAL/PLATELET
Abs Immature Granulocytes: 0.02 10*3/uL (ref 0.00–0.07)
Basophils Absolute: 0 10*3/uL (ref 0.0–0.1)
Basophils Relative: 1 %
Eosinophils Absolute: 0.1 10*3/uL (ref 0.0–0.5)
Eosinophils Relative: 3 %
HCT: 36 % (ref 36.0–46.0)
Hemoglobin: 12 g/dL (ref 12.0–15.0)
Immature Granulocytes: 0 %
Lymphocytes Relative: 30 %
Lymphs Abs: 1.6 10*3/uL (ref 0.7–4.0)
MCH: 34 pg (ref 26.0–34.0)
MCHC: 33.3 g/dL (ref 30.0–36.0)
MCV: 102 fL — ABNORMAL HIGH (ref 80.0–100.0)
Monocytes Absolute: 0.6 10*3/uL (ref 0.1–1.0)
Monocytes Relative: 11 %
Neutro Abs: 3 10*3/uL (ref 1.7–7.7)
Neutrophils Relative %: 55 %
Platelets: 187 10*3/uL (ref 150–400)
RBC: 3.53 MIL/uL — ABNORMAL LOW (ref 3.87–5.11)
RDW: 12 % (ref 11.5–15.5)
WBC: 5.3 10*3/uL (ref 4.0–10.5)
nRBC: 0 % (ref 0.0–0.2)

## 2021-03-19 NOTE — ED Provider Notes (Signed)
[facility] Emergency Department Provider Note  ____________________________________________  Time seen: Approximately 4:40 AM  I have reviewed the triage vital signs and the nursing notes.   HISTORY  Chief Complaint Headache   HPI Amy Ray is a 75 y.o. female with a history of stroke, vertigo, hypertension, hyperlipidemia who presents for evaluation of a headache.  Patient reports that she was in her usual state of health until around 3 PM this afternoon when she started to have a headache.  She reports that the headache was sharp, throbbing, and the top of her head.  Initially mild but it became progressively worse throughout the day.  She reports taking 2 over-the-counter migraine medications and 2 Tylenol's.  At this point the headache has subsided.  She reports some ringing in her ear associated with the headache.  She denies any changes in vision, slurred speech, facial droop, unilateral weakness or numbness, neck stiffness, fever, chills, chest pain, shortness of breath, sore throat or congestion.  Past Medical History:  Diagnosis Date   History of diverticulitis of colon 05/20/2015   per patient report    Hyperlipidemia    Hypertension    Stroke [facility])    TIA (transient ischemic attack)    Vertigo     Patient Active Problem List   Diagnosis Date Noted   Vitamin D deficiency 09/21/2020   Low back pain 05/20/2015   Fatigue 05/20/2015   History of CVA (cerebrovascular accident) 05/20/2015   Insomnia 05/20/2015   Morbid obesity (HCC) 05/20/2015   Positive H. pylori test 05/20/2015   Tremor 05/20/2015   Dysfunction of eustachian tube 05/20/2015   Diverticulosis 05/20/2015   Essential (primary) hypertension 07/17/2007   Alterations of sensations, late effect of cerebrovascular disease 03/11/2005   Hyperlipidemia 10/12/2003   Trigeminal neuralgia 10/12/2003    Past Surgical History:  Procedure Laterality Date   CHOLECYSTECTOMY  2000    HERNIA REPAIR     umbilical hernia as a child   Pseudophakia OS Left 02/12/2020   Dr. Clydene Pugh   REPLACEMENT TOTAL KNEE Bilateral     Prior to Admission medications   Medication Sig Start Date End Date Taking? Authorizing Provider  Acetaminophen (TYLENOL EX ST ARTHRITIS PAIN PO) Take 2 tablets by mouth daily as needed (for arthritis pain).    [provider]  ALPRAZolam [name]) 0.25 MG tablet TAKE 1/2 TO 1 TABLET BY  MOUTH AT BEDTIME AS NEEDED  FOR SLEEP 01/02/21   Malva Limes, MD  alum & mag hydroxide-simeth (MAALOX MAX) 400-400-40 MG/5ML suspension Take 5 mLs by mouth every 6 (six) hours as needed for indigestion. 11/07/19   Nita Sickle, MD  aspirin 81 MG tablet Take 1 tablet by mouth daily.    [provider]  carbamazepine (TEGRETOL) 200 MG tablet Take 1 tablet (200 mg total) by mouth 2 (two) times daily. Patient not taking: Reported on 03/17/2021 01/06/21   Malva Limes, MD  clopidogrel (PLAVIX) 75 MG tablet Take 1 tablet (75 mg total) by mouth daily. 06/27/20   Malva Limes, MD  cyanocobalamin 1000 MCG tablet Take 1,000 mcg by mouth daily.    [provider]  diltiazem (DILT-XR) 180 MG 24 hr capsule TAKE 2 CAPSULES BY MOUTH  DAILY 03/02/21   Malva Limes, MD  docusate sodium (COLACE) 100 MG capsule Take 100 mg by mouth 2 (two) times daily as needed for mild constipation. Patient not taking: Reported on 03/17/2021    [provider]  famotidine (  PEPCID) 40 MG tablet Take 1 tablet (40 mg total) by mouth at bedtime. 06/27/20 06/27/21  Malva LimesFisher, Donald E, MD  fluticasone (FLONASE) 50 MCG/ACT nasal spray Place 2 sprays into both nostrils daily. 09/19/20   Malva LimesFisher, Donald E, MD  furosemide (LASIX) 20 MG tablet Take 1 tablet (20 mg total) by mouth daily as needed for edema. 09/19/20   Malva LimesFisher, Donald E, MD  IRON PO Take 1 tablet by mouth daily.    [provider]  lidocaine (LIDODERM) 5 % Place 1 patch onto the skin daily. Remove & Discard  patch within 12 hours or as directed by MD 02/05/20   Enid DerryWagner, Ashley, PA-C  nortriptyline (PAMELOR) 10 MG capsule Take 1-2 capsules (10-20 mg total) by mouth at bedtime. 05/12/20   Malva LimesFisher, Donald E, MD  polyethylene glycol (MIRALAX / GLYCOLAX) 17 g packet Take 17 g by mouth daily as needed.    [provider]  psyllium (METAMUCIL) 58.6 % packet Take 1 packet by mouth daily.     [provider]  simvastatin (ZOCOR) 20 MG tablet TAKE 1 TABLET BY MOUTH  DAILY 12/13/20   Malva LimesFisher, Donald E, MD    Allergies Augmentin [amoxicillin-pot clavulanate] and Quinapril-hydrochlorothiazide  Family History  Problem Relation Age of Onset   Heart attack Mother    Hypertension Mother    Alzheimer's disease Father    Diabetes Father    Hypertension Sister    Diabetes Brother    Hypertension Sister    Hypertension Sister    Hypertension Sister    Hypertension Sister    Breast cancer Sister [id number]   Breast cancer Maternal Aunt [id number]   Breast cancer Maternal Aunt     Social History Social History   Tobacco Use   Smoking status: Never   Smokeless tobacco: Never  Vaping Use   Vaping Use: Never used  Substance Use Topics   Alcohol use: No    Alcohol/week: 0.0 standard drinks   Drug use: No    Review of Systems  Constitutional: Negative for fever. Eyes: Negative for visual changes. ENT: Negative for sore throat. Neck: No neck pain  Cardiovascular: Negative for chest pain. Respiratory: Negative for shortness of breath. Gastrointestinal: Negative for abdominal pain, vomiting or diarrhea. Genitourinary: Negative for dysuria. Musculoskeletal: Negative for back pain. Skin: Negative for rash. Neurological: Negative for weakness or numbness. +HA Psych: No SI or HI  ____________________________________________   PHYSICAL EXAM:  VITAL SIGNS: ED Triage Vitals [03/19/21 0324]  Enc Vitals Group     BP 114/68     Pulse Rate 92     Resp 18     Temp 98 F (36.7 C)     Temp Source Oral      SpO2 99 %     Weight 210 lb (95.3 kg)     Height 5' (1.524 m)     Head Circumference      Peak Flow      Pain Score 4     Pain Loc      Pain Edu?      Excl. in GC?     Constitutional: Alert and oriented. Well appearing and in no apparent distress. HEENT:      Head: Normocephalic and atraumatic.   No tenderness to palpation over the temporal arteries      Eyes: Conjunctivae are normal. Sclera is non-icteric.       Mouth/Throat: Mucous membranes are moist.       Neck: Supple with no signs of  meningismus. Cardiovascular: Regular rate and rhythm. No murmurs, gallops, or rubs.  Respiratory: Normal respiratory effort. Lungs are clear to auscultation bilaterally.  Gastrointestinal: Soft, non tender. Musculoskeletal:  No edema, cyanosis, or erythema of extremities. Neurologic: Normal speech and language. Face is symmetric.  Intact strength and sensation, no dysmetria or pronator drift.  Normal gait.   Skin: Skin is warm, dry and intact. No rash noted. Psychiatric: Mood and affect are normal. Speech and behavior are normal.  ____________________________________________   LABS (all labs ordered are listed, but only abnormal results are displayed)  Labs Reviewed  CBC WITH DIFFERENTIAL/PLATELET - Abnormal; Notable for the following components:      Result Value   RBC 3.53 (*)    MCV 102.0 (*)    All other components within normal limits  BASIC METABOLIC PANEL - Abnormal; Notable for the following components:   Potassium 3.1 (*)    Calcium 8.7 (*)    GFR, Estimated 59 (*)    All other components within normal limits   ____________________________________________  EKG  none  ____________________________________________  RADIOLOGY  I have personally reviewed the images performed during this visit and I agree with the Radiologist's read.   Interpretation by Radiologist:  CT Head Wo Contrast  Result Date: 03/19/2021 CLINICAL DATA:  Generalized headache with increased  severity of back of head. EXAM: CT HEAD WITHOUT CONTRAST TECHNIQUE: Contiguous axial images were obtained from the base of the skull through the vertex without intravenous contrast. COMPARISON:  CT angio head 01/01/2021 FINDINGS: Brain: Cerebral ventricle sizes are concordant with the degree of cerebral volume loss. Patchy and confluent areas of decreased attenuation are noted throughout the deep and periventricular white matter of the cerebral hemispheres bilaterally, compatible with chronic microvascular ischemic disease. No evidence of large-territorial acute infarction. No parenchymal hemorrhage. No mass lesion. No extra-axial collection. No mass effect or midline shift. No hydrocephalus. Basilar cisterns are patent. Vascular: No hyperdense vessel. Atherosclerotic calcifications are present within the cavernous internal carotid arteries. Skull: No acute fracture or focal lesion. Sinuses/Orbits: Left maxillary sinus mucosal thickening/polyp. Paranasal sinuses and mastoid air cells are clear. Left lens replacement. Otherwise the are unremarkable. Other: None. IMPRESSION: No acute intracranial abnormality. Electronically Signed   By: Tish Frederickson M.D.   On: 03/19/2021 05:44     ____________________________________________   PROCEDURES  Procedure(s) performed: None Procedures Critical Care performed:  None ____________________________________________   INITIAL IMPRESSION / ASSESSMENT AND PLAN / ED COURSE  75 y.o. female with a history of stroke, vertigo, hypertension, hyperlipidemia who presents for evaluation of a headache.  Low suspicion for more serious or life threatening etiology of HA based on history and exam. No sudden onset thunderclap HA, onset with exertion, vomiting, focal neurologic deficits, to suggest increased risk of subarachnoid hemorrhage. No fever, neck pain, neck stiffness, or meningismus on exam to suggest meningitis. No fevers, altered mental status, unusual behavior to  suggest encephalitis. No focal neurologic deficits by history or exam to suggest central venous thrombosis. No constitutional symptoms including fever, fatigue, weight loss, temporal scalp tenderness, jaw claudication, visual loss, to suggest temporal arteritis. No immunocompromise to suggest increased risk for intracranial infectious disease. No visual changes or findings on ocular exam to suggest acute angle closure glaucoma. No reports of toxic exposures including carbon monoxide or other household members with similar symptoms.   Head CT with no acute pathology, visualized by me and read by radiology.  At this time patient has no pain after taking over-the-counter migraine medication and  Tylenol at home.  Will discharge home with supportive care follow-up with PCP.  Discussed my standard return precautions.       _____________________________________________ Please note:  Patient was evaluated in Emergency Department today for the symptoms described in the history of present illness. Patient was evaluated in the context of the global COVID-19 pandemic, which necessitated consideration that the patient might be at risk for infection with the SARS-CoV-2 virus that causes COVID-19. Institutional protocols and algorithms that pertain to the evaluation of patients at risk for COVID-19 are in a state of rapid change based on information released by regulatory bodies including the CDC and federal and state organizations. These policies and algorithms were followed during the patient's care in the ED.  Some ED evaluations and interventions may be delayed as a result of limited staffing during the pandemic.   [place] Controlled Substance Database was reviewed by me. ____________________________________________   FINAL CLINICAL IMPRESSION(S) / ED DIAGNOSES   Final diagnoses:  Acute nonintractable headache, unspecified headache type      NEW MEDICATIONS STARTED DURING THIS VISIT:  ED Discharge Orders      None        Note:  This document was prepared using Dragon voice recognition software and may include unintentional dictation errors.    Don Perking, [place], MD 03/19/21 [phone]

## 2021-03-19 NOTE — ED Triage Notes (Signed)
Pt to triage via w/c with no distress noted; pt reports since 3pm yesterday having generalized HA with hx of same; took excedrin at 6pm without relief; denies any injury, denies any recent illness

## 2021-03-19 NOTE — Discharge Instructions (Addendum)
You have been seen in the Emergency Department (ED) for a headache. Your evaluation today was overall reassuring. Headaches have many possible causes. Most headaches aren't a sign of a more serious problem, and they will get better on their own.   Follow-up with your doctor in 12-24 hours if you are still having a headache. Otherwise follow up with your doctor in 3-5 days.  For pain take tylenol 1000mg every 8 hours as needed  When should you call for help?  Call 911 or return to the ED anytime you think you may need emergency care. For example, call if:  You have signs of a stroke. These may include:  Sudden numbness, paralysis, or weakness in your face, arm, or leg, especially on only one side of your body.  Sudden vision changes.  Sudden trouble speaking.  Sudden confusion or trouble understanding simple statements.  Sudden problems with walking or balance.  A sudden, severe headache that is different from past headaches. You have new or worsening headache Nausea and vomiting associated with your headache Fever, neck stiffness associated with your headache  Call your doctor now or seek immediate medical care if:  You have a new or worse headache.  Your headache gets much worse.  How can you care for yourself at home?  Do not drive if you have taken a prescription pain medicine.  Rest in a quiet, dark room until your headache is gone. Close your eyes and try to relax or go to sleep. Don't watch TV or read.  Put a cold, moist cloth or cold pack on the painful area for 10 to 20 minutes at a time. Put a thin cloth between the cold pack and your skin.  Use a warm, moist towel or a heating pad set on low to relax tight shoulder and neck muscles.  Have someone gently massage your neck and shoulders.  Take pain medicines exactly as directed.  If the doctor gave you a prescription medicine for pain, take it as prescribed.  If you are not taking a prescription pain medicine, ask your doctor  if you can take an over-the-counter medicine. Be careful not to take pain medicine more often than the instructions allow, because you may get worse or more frequent headaches when the medicine wears off.  Do not ignore new symptoms that occur with a headache, such as a fever, weakness or numbness, vision changes, or confusion. These may be signs of a more serious problem.  To prevent headaches  Keep a headache diary so you can figure out what triggers your headaches. Avoiding triggers may help you prevent headaches. Record when each headache began, how long it lasted, and what the pain was like (throbbing, aching, stabbing, or dull). Write down any other symptoms you had with the headache, such as nausea, flashing lights or dark spots, or sensitivity to bright light or loud noise. Note if the headache occurred near your period. List anything that might have triggered the headache, such as certain foods (chocolate, cheese, wine) or odors, smoke, bright light, stress, or lack of sleep.  Find healthy ways to deal with stress. Headaches are most common during or right after stressful times. Take time to relax before and after you do something that has caused a headache in the past.  Try to keep your muscles relaxed by keeping good posture. Check your jaw, face, neck, and shoulder muscles for tension, and try relaxing them. When sitting at a desk, change positions often, and stretch for   30 seconds each hour.  Get plenty of sleep and exercise.  Eat regularly and well. Long periods without food can trigger a headache.  Treat yourself to a massage. Some people find that regular massages are very helpful in relieving tension.  Limit caffeine by not drinking too much coffee, tea, or soda. But don't quit caffeine suddenly, because that can also give you headaches.  Reduce eyestrain from computers by blinking frequently and looking away from the computer screen every so often. Make sure you have proper eyewear and  that your monitor is set up properly, about an arm's length away.  Seek help if you have depression or anxiety. Your headaches may be linked to these conditions. Treatment can both prevent headaches and help with symptoms of anxiety or depression.  

## 2021-03-19 NOTE — ED Notes (Signed)
ED Provider at bedside. 

## 2021-03-30 DIAGNOSIS — H40023 Open angle with borderline findings, high risk, bilateral: Secondary | ICD-10-CM | POA: Diagnosis not present

## 2021-03-30 DIAGNOSIS — H16109 Unspecified superficial keratitis, unspecified eye: Secondary | ICD-10-CM | POA: Diagnosis not present

## 2021-03-30 DIAGNOSIS — H35352 Cystoid macular degeneration, left eye: Secondary | ICD-10-CM | POA: Diagnosis not present

## 2021-03-31 DIAGNOSIS — H40042 Steroid responder, left eye: Secondary | ICD-10-CM | POA: Diagnosis not present

## 2021-03-31 DIAGNOSIS — H35352 Cystoid macular degeneration, left eye: Secondary | ICD-10-CM | POA: Diagnosis not present

## 2021-03-31 DIAGNOSIS — H40023 Open angle with borderline findings, high risk, bilateral: Secondary | ICD-10-CM | POA: Diagnosis not present

## 2021-03-31 DIAGNOSIS — H16109 Unspecified superficial keratitis, unspecified eye: Secondary | ICD-10-CM | POA: Diagnosis not present

## 2021-04-07 ENCOUNTER — Other Ambulatory Visit: Payer: Self-pay | Admitting: Family Medicine

## 2021-04-07 DIAGNOSIS — Z1231 Encounter for screening mammogram for malignant neoplasm of breast: Secondary | ICD-10-CM

## 2021-04-16 DIAGNOSIS — H40042 Steroid responder, left eye: Secondary | ICD-10-CM | POA: Diagnosis not present

## 2021-04-16 DIAGNOSIS — H16109 Unspecified superficial keratitis, unspecified eye: Secondary | ICD-10-CM | POA: Diagnosis not present

## 2021-04-16 DIAGNOSIS — H40023 Open angle with borderline findings, high risk, bilateral: Secondary | ICD-10-CM | POA: Diagnosis not present

## 2021-04-20 ENCOUNTER — Other Ambulatory Visit: Payer: Self-pay | Admitting: Family Medicine

## 2021-04-20 ENCOUNTER — Other Ambulatory Visit: Payer: Self-pay

## 2021-04-20 ENCOUNTER — Encounter (INDEPENDENT_AMBULATORY_CARE_PROVIDER_SITE_OTHER): Payer: Medicare Other | Admitting: Ophthalmology

## 2021-04-20 DIAGNOSIS — H43813 Vitreous degeneration, bilateral: Secondary | ICD-10-CM | POA: Diagnosis not present

## 2021-04-20 DIAGNOSIS — H59032 Cystoid macular edema following cataract surgery, left eye: Secondary | ICD-10-CM | POA: Diagnosis not present

## 2021-04-20 DIAGNOSIS — H35033 Hypertensive retinopathy, bilateral: Secondary | ICD-10-CM

## 2021-04-20 DIAGNOSIS — I10 Essential (primary) hypertension: Secondary | ICD-10-CM

## 2021-04-20 NOTE — Telephone Encounter (Signed)
Future visit in 5  Months

## 2021-04-20 NOTE — Telephone Encounter (Signed)
Medication discontinued on 06/27/20

## 2021-04-23 DIAGNOSIS — H16142 Punctate keratitis, left eye: Secondary | ICD-10-CM | POA: Diagnosis not present

## 2021-04-23 DIAGNOSIS — H4062X0 Glaucoma secondary to drugs, left eye, stage unspecified: Secondary | ICD-10-CM | POA: Diagnosis not present

## 2021-04-23 DIAGNOSIS — H40011 Open angle with borderline findings, low risk, right eye: Secondary | ICD-10-CM | POA: Diagnosis not present

## 2021-05-05 DIAGNOSIS — H35352 Cystoid macular degeneration, left eye: Secondary | ICD-10-CM | POA: Diagnosis not present

## 2021-05-05 DIAGNOSIS — H40042 Steroid responder, left eye: Secondary | ICD-10-CM | POA: Diagnosis not present

## 2021-05-05 DIAGNOSIS — H40023 Open angle with borderline findings, high risk, bilateral: Secondary | ICD-10-CM | POA: Diagnosis not present

## 2021-05-14 ENCOUNTER — Other Ambulatory Visit: Payer: Self-pay | Admitting: Family Medicine

## 2021-05-14 DIAGNOSIS — I10 Essential (primary) hypertension: Secondary | ICD-10-CM

## 2021-05-15 DIAGNOSIS — H40042 Steroid responder, left eye: Secondary | ICD-10-CM | POA: Diagnosis not present

## 2021-05-15 DIAGNOSIS — H35352 Cystoid macular degeneration, left eye: Secondary | ICD-10-CM | POA: Diagnosis not present

## 2021-05-15 DIAGNOSIS — H40023 Open angle with borderline findings, high risk, bilateral: Secondary | ICD-10-CM | POA: Diagnosis not present

## 2021-05-21 ENCOUNTER — Ambulatory Visit: Payer: Self-pay | Admitting: *Deleted

## 2021-05-21 NOTE — Telephone Encounter (Signed)
Patient is calling to report she has been having dizzy spells almost daily since her COVID booster. Patient states she did call EMS yesterday and they checked her. They told her that her dizziness could be SE of COVID booster given 05/11/21. Patient does have hx of vertigo- but this is different. Patient states she gets unstable about 11:00 am and it can last until evening. Patient states this occurs almost daily. Call to office- appointment scheduled.

## 2021-05-21 NOTE — Telephone Encounter (Signed)
Reason for Disposition  [1] MODERATE dizziness (e.g., interferes with normal activities) AND [2] has NOT been evaluated by physician for this  (Exception: dizziness caused by heat exposure, sudden standing, or poor fluid intake)  Answer Assessment - Initial Assessment Questions 1. DESCRIPTION: "Describe your dizziness."     Lightheaded and dizziness in afternoon- 11:00 am she feels it coming on- not every day- most days- EMS was called yesterday- check patient- they think COVID booster related 2. LIGHTHEADED: "Do you feel lightheaded?" (e.g., somewhat faint, woozy, weak upon standing)     Weak and dizzy 3. VERTIGO: "Do you feel like either you or the room is spinning or tilting?" (i.e. vertigo)     Patient has vertigo hx and does take medication when needed- this is different 4. SEVERITY: "How bad is it?"  "Do you feel like you are going to faint?" "Can you stand and walk?"   - MILD: Feels slightly dizzy, but walking normally.   - MODERATE: Feels unsteady when walking, but not falling; interferes with normal activities (e.g., school, work).   - SEVERE: Unable to walk without falling, or requires assistance to walk without falling; feels like passing out now.      Mild/moderate 5. ONSET:  "When did the dizziness begin?"     05/11/21- [facility] 6. AGGRAVATING FACTORS: "Does anything make it worse?" (e.g., standing, change in head position)     No  7. HEART RATE: "Can you tell me your heart rate?" "How many beats in 15 seconds?"  (Note: not all patients can do this)       118/69 8. CAUSE: "What do you think is causing the dizziness?"     Unsure- possible SE booster 9. RECURRENT SYMPTOM: "Have you had dizziness before?" If Yes, ask: "When was the last time?" "What happened that time?"     No 10. OTHER SYMPTOMS: "Do you have any other symptoms?" (e.g., fever, chest pain, vomiting, diarrhea, bleeding)       no 11. PREGNANCY: "Is there any chance you are pregnant?" "When was your last menstrual  period?"       N/a  Protocols used: Dizziness - Lightheadedness-A-AH

## 2021-05-22 ENCOUNTER — Ambulatory Visit: Payer: Medicare Other | Admitting: Family Medicine

## 2021-05-22 NOTE — Progress Notes (Deleted)
      Established patient visit   Patient: Amy Ray   DOB: 01-Feb-1946   75 y.o. Female  MRN: [id number] Visit Date: 05/22/2021  Today's healthcare provider: Jacky Kindle, FNP   No chief complaint on file.  Subjective    Dizziness This is a new problem.   ***  {Show patient history (optional):23778}   Medications: Outpatient Medications Prior to Visit  Medication Sig  . Acetaminophen (TYLENOL EX ST ARTHRITIS PAIN PO) Take 2 tablets by mouth daily as needed (for arthritis pain).  Marland Kitchen ALPRAZolam (XANAX) 0.25 MG tablet TAKE 1/2 TO 1 TABLET BY  MOUTH AT BEDTIME AS NEEDED  FOR SLEEP  . alum & mag hydroxide-simeth (MAALOX MAX) 400-400-40 MG/5ML suspension Take 5 mLs by mouth every 6 (six) hours as needed for indigestion.  Marland Kitchen aspirin 81 MG tablet Take 1 tablet by mouth daily.  . carbamazepine (TEGRETOL) 200 MG tablet Take 1 tablet (200 mg total) by mouth 2 (two) times daily. (Patient not taking: Reported on 03/17/2021)  . clopidogrel (PLAVIX) 75 MG tablet Take 1 tablet (75 mg total) by mouth daily.  . cyanocobalamin 1000 MCG tablet Take 1,000 mcg by mouth daily.  Marland Kitchen diltiazem (DILT-XR) 180 MG 24 hr capsule TAKE 2 CAPSULES BY MOUTH  DAILY  . docusate sodium (COLACE) 100 MG capsule Take 100 mg by mouth 2 (two) times daily as needed for mild constipation. (Patient not taking: Reported on 03/17/2021)  . famotidine (PEPCID) 40 MG tablet TAKE 1 TABLET BY MOUTH AT  BEDTIME  . fluticasone (FLONASE) 50 MCG/ACT nasal spray Place 2 sprays into both nostrils daily.  . furosemide (LASIX) 20 MG tablet Take 1 tablet (20 mg total) by mouth daily as needed for edema.  . IRON PO Take 1 tablet by mouth daily.  Marland Kitchen lidocaine (LIDODERM) 5 % Place 1 patch onto the skin daily. Remove & Discard patch within 12 hours or as directed by MD  . nortriptyline (PAMELOR) 10 MG capsule Take 1-2 capsules (10-20 mg total) by mouth at bedtime.  . polyethylene glycol (MIRALAX / GLYCOLAX) 17 g packet Take 17 g by mouth daily as  needed.  . psyllium (METAMUCIL) 58.6 % packet Take 1 packet by mouth daily.   . simvastatin (ZOCOR) 20 MG tablet TAKE 1 TABLET BY MOUTH  DAILY   No facility-administered medications prior to visit.    Review of Systems  Neurological:  Positive for dizziness.   {Labs  Heme  Chem  Endocrine  Serology  Results Review (optional):23779}   Objective    There were no vitals taken for this visit. {Show previous vital signs (optional):23777}   Physical Exam  ***  No results found for any visits on 05/22/21.  Assessment & Plan     ***  No follow-ups on file.      {provider attestation***:1}   Jacky Kindle, FNP  [facility] [phone] (phone) [phone] (fax)  [facility]

## 2021-05-25 ENCOUNTER — Emergency Department: Payer: Medicare Other

## 2021-05-25 ENCOUNTER — Ambulatory Visit: Payer: Self-pay | Admitting: *Deleted

## 2021-05-25 ENCOUNTER — Emergency Department
Admission: EM | Admit: 2021-05-25 | Discharge: 2021-05-25 | Disposition: A | Discharge status: Home / Self Care | Payer: Medicare Other | Attending: Emergency Medicine | Admitting: Emergency Medicine

## 2021-05-25 ENCOUNTER — Other Ambulatory Visit: Payer: Self-pay

## 2021-05-25 DIAGNOSIS — Z7982 Long term (current) use of aspirin: Secondary | ICD-10-CM | POA: Diagnosis not present

## 2021-05-25 DIAGNOSIS — H532 Diplopia: Secondary | ICD-10-CM | POA: Diagnosis not present

## 2021-05-25 DIAGNOSIS — R42 Dizziness and giddiness: Secondary | ICD-10-CM | POA: Diagnosis not present

## 2021-05-25 DIAGNOSIS — I10 Essential (primary) hypertension: Secondary | ICD-10-CM | POA: Insufficient documentation

## 2021-05-25 DIAGNOSIS — Z79899 Other long term (current) drug therapy: Secondary | ICD-10-CM | POA: Diagnosis not present

## 2021-05-25 DIAGNOSIS — R29818 Other symptoms and signs involving the nervous system: Secondary | ICD-10-CM | POA: Diagnosis not present

## 2021-05-25 DIAGNOSIS — Z7902 Long term (current) use of antithrombotics/antiplatelets: Secondary | ICD-10-CM | POA: Insufficient documentation

## 2021-05-25 DIAGNOSIS — Z8673 Personal history of transient ischemic attack (TIA), and cerebral infarction without residual deficits: Secondary | ICD-10-CM | POA: Diagnosis not present

## 2021-05-25 LAB — URINALYSIS, COMPLETE (UACMP) WITH MICROSCOPIC
Bacteria, UA: NONE SEEN
Bilirubin Urine: NEGATIVE
Glucose, UA: NEGATIVE mg/dL
Hgb urine dipstick: NEGATIVE
Ketones, ur: NEGATIVE mg/dL
Nitrite: NEGATIVE
Protein, ur: NEGATIVE mg/dL
Specific Gravity, Urine: 1.014 (ref 1.005–1.030)
pH: 5 (ref 5.0–8.0)

## 2021-05-25 LAB — CBC
HCT: 37.9 % (ref 36.0–46.0)
Hemoglobin: 12.9 g/dL (ref 12.0–15.0)
MCH: 34 pg (ref 26.0–34.0)
MCHC: 34 g/dL (ref 30.0–36.0)
MCV: 100 fL (ref 80.0–100.0)
Platelets: 223 10*3/uL (ref 150–400)
RBC: 3.79 MIL/uL — ABNORMAL LOW (ref 3.87–5.11)
RDW: 11.9 % (ref 11.5–15.5)
WBC: 6 10*3/uL (ref 4.0–10.5)
nRBC: 0 % (ref 0.0–0.2)

## 2021-05-25 LAB — BASIC METABOLIC PANEL
Anion gap: 8 (ref 5–15)
BUN: 29 mg/dL — ABNORMAL HIGH (ref 8–23)
CO2: 28 mmol/L (ref 22–32)
Calcium: 9.3 mg/dL (ref 8.9–10.3)
Chloride: 102 mmol/L (ref 98–111)
Creatinine, Ser: 1.35 mg/dL — ABNORMAL HIGH (ref 0.44–1.00)
GFR, Estimated: 41 mL/min — ABNORMAL LOW (ref >60)
Glucose, Bld: 98 mg/dL (ref 70–99)
Potassium: 3.9 mmol/L (ref 3.5–5.1)
Sodium: 138 mmol/L (ref 135–145)

## 2021-05-25 MED ORDER — MECLIZINE HCL 25 MG PO TABS: 25.0000 mg | ORAL_TABLET | Freq: Two times a day (BID) | ORAL | 0 refills | Status: AC

## 2021-05-25 MED ORDER — LORAZEPAM 1 MG PO TABS
2.0000 mg | ORAL_TABLET | Freq: Once | ORAL | Status: AC
  Administered 2021-05-25: 2 mg via ORAL
  Filled 2021-05-25: qty 2

## 2021-05-25 NOTE — ED Notes (Signed)
Pt with MRI

## 2021-05-25 NOTE — ED Notes (Signed)
Pt back from CT at this time 

## 2021-05-25 NOTE — ED Triage Notes (Signed)
Pt to ED for dizziness since taking 2nd COVID booster on 8/1. NAD noted Denies pain Hx vertigo

## 2021-05-25 NOTE — Discharge Instructions (Addendum)
Your CT and MRI of the brain were normal indicating that your dizziness is unlikely to be related to your brain.  You can take the Antivert for symptoms of dizziness.

## 2021-05-25 NOTE — ED Notes (Signed)
Pt back from MRI at this time

## 2021-05-25 NOTE — Telephone Encounter (Signed)
Amy Ray has availability this week.

## 2021-05-25 NOTE — ED Notes (Signed)
Pt assisted to toilet. Plan of care discussed with pt.

## 2021-05-25 NOTE — Telephone Encounter (Signed)
Patient is requesting reschedule appointment for dizziness- patient didi not have transportation and missed last appointment. Patient advised no appointment available within disposition at office- she is requesting to be seen. Call forwarded for review of request.

## 2021-05-25 NOTE — Telephone Encounter (Signed)
Patient is still having dizziness. Patient could not make her last appointment- she did not have transportation.  Reason for Disposition  [1] MODERATE dizziness (e.g., interferes with normal activities) AND [2] has NOT been evaluated by physician for this  (Exception: dizziness caused by heat exposure, sudden standing, or poor fluid intake)  Answer Assessment - Initial Assessment Questions 1. DESCRIPTION: "Describe your dizziness."     Dizziness, eye tension,sleeping a lot 2. LIGHTHEADED: "Do you feel lightheaded?" (e.g., somewhat faint, woozy, weak upon standing)     Patient states she had dizziness reaction after the COVID vaccine- she gets dizziness in the mid morning into the afternoon 3. VERTIGO: "Do you feel like either you or the room is spinning or tilting?" (i.e. vertigo)     Patient states she had vertigo with it- but the vertigo is better 4. SEVERITY: "How bad is it?"  "Do you feel like you are going to faint?" "Can you stand and walk?"   - MILD: Feels slightly dizzy, but walking normally.   - MODERATE: Feels unsteady when walking, but not falling; interferes with normal activities (e.g., school, work).   - SEVERE: Unable to walk without falling, or requires assistance to walk without falling; feels like passing out now.      Mild/moderate 5. ONSET:  "When did the dizziness begin?"     After second booster 6. AGGRAVATING FACTORS: "Does anything make it worse?" (e.g., standing, change in head position)     no 7. HEART RATE: "Can you tell me your heart rate?" "How many beats in 15 seconds?"  (Note: not all patients can do this)       EMS advised her to call PCP 8. CAUSE: "What do you think is causing the dizziness?"     Not sure- SE from COVID vaccine 9. RECURRENT SYMPTOM: "Have you had dizziness before?" If Yes, ask: "When was the last time?" "What happened that time?"     no 10. OTHER SYMPTOMS: "Do you have any other symptoms?" (e.g., fever, chest pain, vomiting, diarrhea,  bleeding)       no 11. PREGNANCY: "Is there any chance you are pregnant?" "When was your last menstrual period?"       N/a  Protocols used: Dizziness - Lightheadedness-A-AH

## 2021-05-25 NOTE — ED Notes (Signed)
MRI notified of PO ativan administration. MRI states they will get pt for scan in 15 minutes.

## 2021-05-25 NOTE — ED Provider Notes (Signed)
[facility]  ____________________________________________   Event Date/Time   First MD Initiated Contact with Patient 05/25/21 1500     (approximate)  I have reviewed the triage vital signs and the nursing notes.   HISTORY  Chief Complaint Dizziness    HPI Amy Ray is a 75 y.o. female CVA, vertigo, hypertension, hyperlipidemia who presents with feeling of dizziness.  Patient describes the sensation as feeling like the room is moving.  She says this started about 10 days ago after getting her second COVID-vaccine.  She notes that she has had vertigo in the past and did have this this week but that is now gone.  The vertigo is a spinning feeling that is distinct from what she is feeling now.  The dizziness is intermittent.  It is associated with double vision and blurry vision.  She notes that the double vision goes away when she starts her left eye.  Images are horizontal.  She denies any associated numbness, weakness, nausea or vomiting, difficulty ambulating, headache.  She denies any fevers chills chest pain.         Past Medical History:  Diagnosis Date   History of diverticulitis of colon 05/20/2015   per patient report    Hyperlipidemia    Hypertension    Stroke [facility])    TIA (transient ischemic attack)    Vertigo     Patient Active Problem List   Diagnosis Date Noted   Vitamin D deficiency 09/21/2020   Low back pain 05/20/2015   Fatigue 05/20/2015   History of CVA (cerebrovascular accident) 05/20/2015   Insomnia 05/20/2015   Morbid obesity (HCC) 05/20/2015   Positive H. pylori test 05/20/2015   Tremor 05/20/2015   Dysfunction of eustachian tube 05/20/2015   Diverticulosis 05/20/2015   Essential (primary) hypertension 07/17/2007   Alterations of sensations, late effect of cerebrovascular disease 03/11/2005   Hyperlipidemia 10/12/2003   Trigeminal neuralgia 10/12/2003    Past Surgical History:  Procedure Laterality Date    CHOLECYSTECTOMY  2000   HERNIA REPAIR     umbilical hernia as a child   Pseudophakia OS Left 02/12/2020   Dr. Clydene Pugh   REPLACEMENT TOTAL KNEE Bilateral     Prior to Admission medications   Medication Sig Start Date End Date Taking? Authorizing Provider  meclizine (ANTIVERT) 25 MG tablet Take 1 tablet (25 mg total) by mouth 2 (two) times daily for 7 days. 05/25/21 06/01/21 Yes Georga Hacking, MD  Acetaminophen (TYLENOL EX ST ARTHRITIS PAIN PO) Take 2 tablets by mouth daily as needed (for arthritis pain).    [provider]  ALPRAZolam [name]) 0.25 MG tablet TAKE 1/2 TO 1 TABLET BY  MOUTH AT BEDTIME AS NEEDED  FOR SLEEP 01/02/21   Malva Limes, MD  alum & mag hydroxide-simeth (MAALOX MAX) 400-400-40 MG/5ML suspension Take 5 mLs by mouth every 6 (six) hours as needed for indigestion. 11/07/19   Nita Sickle, MD  aspirin 81 MG tablet Take 1 tablet by mouth daily.    [provider]  carbamazepine (TEGRETOL) 200 MG tablet Take 1 tablet (200 mg total) by mouth 2 (two) times daily. Patient not taking: Reported on 03/17/2021 01/06/21   Malva Limes, MD  clopidogrel (PLAVIX) 75 MG tablet Take 1 tablet (75 mg total) by mouth daily. 06/27/20   Malva Limes, MD  cyanocobalamin 1000 MCG tablet Take 1,000 mcg by mouth daily.    [provider]  diltiazem (DILT-XR) 180 MG 24 hr capsule  TAKE 2 CAPSULES BY MOUTH  DAILY 03/02/21   Malva Limes, MD  docusate sodium (COLACE) 100 MG capsule Take 100 mg by mouth 2 (two) times daily as needed for mild constipation. Patient not taking: Reported on 03/17/2021    [provider]  famotidine (PEPCID) 40 MG tablet TAKE 1 TABLET BY MOUTH AT  BEDTIME 04/20/21   Malva Limes, MD  fluticasone (FLONASE) 50 MCG/ACT nasal spray Place 2 sprays into both nostrils daily. 09/19/20   Malva Limes, MD  furosemide (LASIX) 20 MG tablet Take 1 tablet (20 mg total) by mouth daily as needed for edema. 09/19/20   Malva Limes,  MD  IRON PO Take 1 tablet by mouth daily.    [provider]  lidocaine (LIDODERM) 5 % Place 1 patch onto the skin daily. Remove & Discard patch within 12 hours or as directed by MD 02/05/20   Enid Derry, PA-C  nortriptyline (PAMELOR) 10 MG capsule Take 1-2 capsules (10-20 mg total) by mouth at bedtime. 05/12/20   Malva Limes, MD  polyethylene glycol (MIRALAX / GLYCOLAX) 17 g packet Take 17 g by mouth daily as needed.    [provider]  psyllium (METAMUCIL) 58.6 % packet Take 1 packet by mouth daily.     [provider]  simvastatin (ZOCOR) 20 MG tablet TAKE 1 TABLET BY MOUTH  DAILY 12/13/20   Malva Limes, MD    Allergies Augmentin [amoxicillin-pot clavulanate] and Quinapril-hydrochlorothiazide  Family History  Problem Relation Age of Onset   Heart attack Mother    Hypertension Mother    Alzheimer's disease Father    Diabetes Father    Hypertension Sister    Diabetes Brother    Hypertension Sister    Hypertension Sister    Hypertension Sister    Hypertension Sister    Breast cancer Sister 77   Breast cancer Maternal Aunt 29   Breast cancer Maternal Aunt     Social History Social History   Tobacco Use   Smoking status: Never   Smokeless tobacco: Never  Vaping Use   Vaping Use: Never used  Substance Use Topics   Alcohol use: No    Alcohol/week: 0.0 standard drinks   Drug use: No    Review of Systems   Review of Systems  Constitutional:  Negative for chills and fever.  Eyes:  Positive for visual disturbance.  Respiratory:  Negative for shortness of breath.   Cardiovascular:  Negative for chest pain.  Gastrointestinal:  Negative for abdominal pain.  Neurological:  Positive for dizziness. Negative for syncope, speech difficulty, weakness, numbness and headaches.  All other systems reviewed and are negative.  Physical Exam Updated Vital Signs BP (!) 141/93   Pulse 97   Temp 98.9 F (37.2 C) (Oral)   Resp 19   Ht 5\' 1"  (1.549  m)   Wt 93 kg   SpO2 100%   BMI 38.73 kg/m   Physical Exam Vitals and nursing note reviewed.  Constitutional:      General: She is not in acute distress.    Appearance: Normal appearance.  HENT:     Head: Normocephalic and atraumatic.  Eyes:     General: No scleral icterus.    Conjunctiva/sclera: Conjunctivae normal.  Pulmonary:     Effort: Pulmonary effort is normal. No respiratory distress.     Breath sounds: No stridor.  Musculoskeletal:        General: No deformity or signs of injury.  Cervical back: Normal range of motion.  Skin:    General: Skin is dry.     Coloration: Skin is not jaundiced or pale.  Neurological:     General: No focal deficit present.     Mental Status: She is alert and oriented to person, place, and time. Mental status is at baseline.     Comments: Pupils are equal round and reactive, extraocular movements are intact Right beating nystagmus, positive vertical nystagmus Face is symmetric, normal tongue movement, normal palatal rise Normal finger-nose-finger Mild ataxia with ambulation 5 out of 5 strength in the bilateral upper and lower extremities Sensation grossly intact bilaterally  Psychiatric:        Mood and Affect: Mood normal.        Behavior: Behavior normal.     LABS (all labs ordered are listed, but only abnormal results are displayed)  Labs Reviewed  BASIC METABOLIC PANEL - Abnormal; Notable for the following components:      Result Value   BUN 29 (*)    Creatinine, Ser 1.35 (*)    GFR, Estimated 41 (*)    All other components within normal limits  CBC - Abnormal; Notable for the following components:   RBC 3.79 (*)    All other components within normal limits  URINALYSIS, COMPLETE (UACMP) WITH MICROSCOPIC - Abnormal; Notable for the following components:   Color, Urine YELLOW (*)    APPearance HAZY (*)    Leukocytes,Ua TRACE (*)    All other components within normal limits    ____________________________________________  EKG  Normal sinus rhythm, normal intervals, normal axis, no acute ischemic changes ____________________________________________  RADIOLOGY I, Randol Kern, personally viewed and evaluated these images (plain radiographs) as part of my medical decision making, as well as reviewing the written report by the radiologist.  ED MD interpretation: CT head reviewed by me does not show any acute pathology   ____________________________________________   PROCEDURES  Procedure(s) performed (including Critical Care):  Procedures   ____________________________________________   INITIAL IMPRESSION / ASSESSMENT AND PLAN / ED COURSE     The patient is a 75 year old female who presents with nonspecific dizziness that is distinct from her vertigo.  She has associated double vision.  On my exam she appears very well however she does have vertical nystagmus.  She has no dysmetria and is somewhat ataxic but tells me that this is her baseline.  Given this vertical nystagmus I am concerned for central etiology of her dizziness.  Will obtain CT head and discuss further imaging with neurology.    CT head is normal.  Discussed with Dr. Selina Cooley with neurology who recommends MRI.  MRI brain obtained which does not show any acute infarct, likely that this is central vertigo.  We will give patient a prescription for meclizine.  Return precautions discussed.   ____________________________________________   FINAL CLINICAL IMPRESSION(S) / ED DIAGNOSES  Final diagnoses:  Dizziness     ED Discharge Orders          Ordered    meclizine (ANTIVERT) 25 MG tablet  2 times daily        05/25/21 2059             Note:  This document was prepared using Dragon voice recognition software and may include unintentional dictation errors.    Georga Hacking, MD 05/25/21 2101

## 2021-05-25 NOTE — Telephone Encounter (Signed)
Called pt back and person that answered the phone stated that pt went to the hospital.

## 2021-05-25 NOTE — ED Notes (Signed)
Pt states she had covid booster shot on august 8, and that evening had dizziness and diarrhea. Pt c/o of intermittent dizziness since the 8th. Pt states "it feels like the room is spinning around when it happens." pt states it does not get worse w/ movement. Pt denies CP, SOB, N/V/D at this time.

## 2021-05-25 NOTE — Telephone Encounter (Signed)
Called pt and whoever picked up stated that she was not there and asked that we call back later.  He did not want to give pt a message.

## 2021-05-25 NOTE — ED Notes (Signed)
Pt speaking to MRI screener on mobile at this time. Daughter at bedside. TV programmed for pt.

## 2021-05-29 DIAGNOSIS — H40042 Steroid responder, left eye: Secondary | ICD-10-CM | POA: Diagnosis not present

## 2021-05-29 DIAGNOSIS — H35352 Cystoid macular degeneration, left eye: Secondary | ICD-10-CM | POA: Diagnosis not present

## 2021-05-29 DIAGNOSIS — H40023 Open angle with borderline findings, high risk, bilateral: Secondary | ICD-10-CM | POA: Diagnosis not present

## 2021-05-29 DIAGNOSIS — H16142 Punctate keratitis, left eye: Secondary | ICD-10-CM | POA: Diagnosis not present

## 2021-06-02 ENCOUNTER — Other Ambulatory Visit: Payer: Self-pay

## 2021-06-02 ENCOUNTER — Ambulatory Visit
Admission: RE | Admit: 2021-06-02 | Discharge: 2021-06-02 | Disposition: A | Discharge status: Home / Self Care | Payer: Medicare Other | Source: Ambulatory Visit | Attending: Family Medicine | Admitting: Family Medicine

## 2021-06-02 DIAGNOSIS — Z1231 Encounter for screening mammogram for malignant neoplasm of breast: Secondary | ICD-10-CM | POA: Diagnosis not present

## 2021-06-04 ENCOUNTER — Other Ambulatory Visit: Payer: Self-pay | Admitting: Family Medicine

## 2021-06-04 DIAGNOSIS — N6310 Unspecified lump in the right breast, unspecified quadrant: Secondary | ICD-10-CM

## 2021-06-04 DIAGNOSIS — R928 Other abnormal and inconclusive findings on diagnostic imaging of breast: Secondary | ICD-10-CM

## 2021-06-06 DIAGNOSIS — J00 Acute nasopharyngitis [common cold]: Secondary | ICD-10-CM | POA: Diagnosis not present

## 2021-06-09 ENCOUNTER — Telehealth: Payer: Self-pay | Admitting: *Deleted

## 2021-06-09 NOTE — Telephone Encounter (Signed)
CALLED PT TO [facility] [employer] [facility]- SHE STATED HER CAR IS IN THE SHOP RIGHT NOW, SHE DOESN'T HAVE TRANSPORTATION.  SHE PLANS TO CALL ME BACK WHEN SHE WILL HAVE HER CAR BACK TO [facility]

## 2021-06-16 ENCOUNTER — Other Ambulatory Visit: Payer: Self-pay

## 2021-06-16 ENCOUNTER — Ambulatory Visit
Admission: RE | Admit: 2021-06-16 | Discharge: 2021-06-16 | Disposition: A | Discharge status: Home / Self Care | Payer: Medicare Other | Source: Ambulatory Visit | Attending: Family Medicine | Admitting: Family Medicine

## 2021-06-16 DIAGNOSIS — N6310 Unspecified lump in the right breast, unspecified quadrant: Secondary | ICD-10-CM | POA: Diagnosis not present

## 2021-06-16 DIAGNOSIS — R928 Other abnormal and inconclusive findings on diagnostic imaging of breast: Secondary | ICD-10-CM | POA: Insufficient documentation

## 2021-06-16 DIAGNOSIS — R922 Inconclusive mammogram: Secondary | ICD-10-CM | POA: Diagnosis not present

## 2021-06-22 DIAGNOSIS — H35352 Cystoid macular degeneration, left eye: Secondary | ICD-10-CM | POA: Diagnosis not present

## 2021-06-22 DIAGNOSIS — H40042 Steroid responder, left eye: Secondary | ICD-10-CM | POA: Diagnosis not present

## 2021-06-22 DIAGNOSIS — H40023 Open angle with borderline findings, high risk, bilateral: Secondary | ICD-10-CM | POA: Diagnosis not present

## 2021-06-22 DIAGNOSIS — H16142 Punctate keratitis, left eye: Secondary | ICD-10-CM | POA: Diagnosis not present

## 2021-06-22 DIAGNOSIS — H1820 Unspecified corneal edema: Secondary | ICD-10-CM | POA: Diagnosis not present

## 2021-06-23 ENCOUNTER — Other Ambulatory Visit: Payer: Self-pay | Admitting: Family Medicine

## 2021-06-23 ENCOUNTER — Ambulatory Visit: Payer: Medicare Other | Admitting: Family Medicine

## 2021-06-23 DIAGNOSIS — G4700 Insomnia, unspecified: Secondary | ICD-10-CM

## 2021-06-23 MED ORDER — ALPRAZOLAM 0.25 MG PO TABS: 0.1250 mg | ORAL_TABLET | Freq: Every evening | ORAL | 5 refills | Status: DC | PRN

## 2021-06-23 NOTE — Telephone Encounter (Signed)
Erin from [facility] requesting ALPRAZolam (XANAX) 0.25 MG tablet refill. Caller states delivery tracker confirms medication was delivered on 06/05/2021 but patient denies receiving it.    [facility]  [facility]) - [place], [place] - [address] [place] Phone:  [phone]  Fax:  [phone]

## 2021-06-23 NOTE — Telephone Encounter (Signed)
LOV: 03/17/2021 NOV: 09/18/2021   Last Refill: 01/02/2021 #90 5 Refills.   Thanks,   -Vernona Rieger

## 2021-07-15 ENCOUNTER — Other Ambulatory Visit: Payer: Self-pay | Admitting: Family Medicine

## 2021-07-15 DIAGNOSIS — R609 Edema, unspecified: Secondary | ICD-10-CM

## 2021-07-17 ENCOUNTER — Ambulatory Visit (INDEPENDENT_AMBULATORY_CARE_PROVIDER_SITE_OTHER): Payer: Medicare Other | Admitting: Physician Assistant

## 2021-07-17 ENCOUNTER — Other Ambulatory Visit: Payer: Self-pay

## 2021-07-17 ENCOUNTER — Encounter: Payer: Self-pay | Admitting: Physician Assistant

## 2021-07-17 VITALS — BP 122/76 | HR 89 | Temp 97.9°F | Ht 61.0 in | Wt 211.2 lb

## 2021-07-17 DIAGNOSIS — Z8673 Personal history of transient ischemic attack (TIA), and cerebral infarction without residual deficits: Secondary | ICD-10-CM

## 2021-07-17 DIAGNOSIS — G4700 Insomnia, unspecified: Secondary | ICD-10-CM | POA: Diagnosis not present

## 2021-07-17 DIAGNOSIS — I10 Essential (primary) hypertension: Secondary | ICD-10-CM

## 2021-07-17 DIAGNOSIS — Z23 Encounter for immunization: Secondary | ICD-10-CM

## 2021-07-17 NOTE — Patient Instructions (Addendum)
Great to meet you today! Please monitor your blood pressure at home. Do not take the Hydrochlorothiazide 12.5 mg. Discontinue this medication.  Call if any concerns  Flu shot in office today  See you back in 3 months

## 2021-07-17 NOTE — Progress Notes (Signed)
Subjective:    Patient ID: Amy Ray, female    DOB: Jan 13, 1946, 75 y.o.   MRN: [id number]  Chief Complaint  Patient presents with   Establish Care    HPI Patient is in today for new patient establishment. She states that she needed a provider closer to her new home.  She has no acute concerns today. She is stable on her current medication regimen.  Hx of CVA March 16, 2005, left sided facial neuralgia persistent. Carbamazepine and Nortriptyline helps with this a lot says the patient. She tried to come off these medications once and says the nerve pain was much worse. Takes ASA 81 mg and Plavix 75 mg daily.   Her blood pressure has been stable. She has her medication bottles with her today and HCTZ 12.5 mg is with them, but this is not listed in her chart. She states that she has been taking this, but is concerned it might be making her somewhat fatigued and dizzy.   Also takes Alprazolam 0.25 mg 1/2 to 1 tab every night for insomnia.   Past Medical History:  Diagnosis Date   History of diverticulitis of colon 05/20/2015   per patient report    Hyperlipidemia    Hypertension    Stroke (HCC) 03/16/2005   TIA (transient ischemic attack)    Vertigo     Past Surgical History:  Procedure Laterality Date   CHOLECYSTECTOMY  2000   HERNIA REPAIR     umbilical hernia as a child   Pseudophakia OS Left 02/12/2020   Dr. Clydene Pugh   REPLACEMENT TOTAL KNEE Bilateral     Family History  Problem Relation Age of Onset   Heart attack Mother    Hypertension Mother    Alzheimer's disease Father    Diabetes Father    Hypertension Sister    Diabetes Brother    Hypertension Sister    Hypertension Sister    Hypertension Sister    Hypertension Sister    Breast cancer Sister 84   Breast cancer Maternal Aunt 9   Breast cancer Maternal Aunt     Social History   Tobacco Use   Smoking status: Never   Smokeless tobacco: Never  Vaping Use   Vaping Use: Never used  Substance Use  Topics   Alcohol use: No    Alcohol/week: 0.0 standard drinks   Drug use: No     Allergies  Allergen Reactions   Augmentin [Amoxicillin-Pot Clavulanate] Hives   Quinapril-Hydrochlorothiazide Other (See Comments)    Other reaction(s): Headache    Review of Systems REFER TO HPI FOR PERTINENT POSITIVES AND NEGATIVES      Objective:     BP 122/76   Pulse 89   Temp 97.9 F (36.6 C)   Ht 5\' 1"  (1.549 m)   Wt 211 lb 3.2 oz (95.8 kg)   SpO2 100%   BMI 39.91 kg/m   Wt Readings from Last 3 Encounters:  07/17/21 211 lb 3.2 oz (95.8 kg)  05/25/21 205 lb (93 kg)  03/19/21 210 lb (95.3 kg)    BP Readings from Last 3 Encounters:  07/17/21 122/76  05/25/21 (!) 141/93  03/19/21 (!) 122/55     Physical Exam Vitals and nursing note reviewed.  Constitutional:      Appearance: Normal appearance. She is obese. She is not toxic-appearing.     Comments: Using a cane  HENT:     Head: Normocephalic and atraumatic.     Right Ear: Tympanic membrane, ear  canal and external ear normal.     Left Ear: Tympanic membrane, ear canal and external ear normal.     Nose: Nose normal.     Mouth/Throat:     Mouth: Mucous membranes are moist.  Eyes:     Extraocular Movements: Extraocular movements intact.     Conjunctiva/sclera: Conjunctivae normal.     Pupils: Pupils are equal, round, and reactive to light.  Cardiovascular:     Rate and Rhythm: Normal rate and regular rhythm.     Pulses: Normal pulses.     Heart sounds: Normal heart sounds.  Pulmonary:     Effort: Pulmonary effort is normal.     Breath sounds: Normal breath sounds.  Musculoskeletal:        General: Normal range of motion.     Cervical back: Normal range of motion and neck supple.  Skin:    General: Skin is warm and dry.  Neurological:     General: No focal deficit present.     Mental Status: She is alert and oriented to person, place, and time.     Sensory: Sensory deficit (left side of face) present.  Psychiatric:         Mood and Affect: Mood normal.        Behavior: Behavior normal.        Thought Content: Thought content normal.        Judgment: Judgment normal.       Assessment & Plan:   Problem List Items Addressed This Visit       Cardiovascular and Mediastinum   Essential (primary) hypertension - Primary     Other   History of CVA (cerebrovascular accident)   Insomnia   Other Visit Diagnoses     Need for immunization against influenza       Relevant Orders   Flu Vaccine QUAD High Dose(Fluad) (Completed)       1. Essential (primary) hypertension Seems to be a discrepancy in her medications. She is taking Diltiazem XR 180 mg 2 capsules daily and HCTZ 12.5 mg daily (which is not listed). -D/C the HCTZ and monitor BP readings at home -Limit salt -Drink plenty of water  2. History of CVA (cerebrovascular accident) Left sided facial neuralgia as a result of this. Stable on current medication regimen.  3. Insomnia, unspecified type Stable on Xanax 0.125 to 0.25 mg at bedtime prn. PDMP reviewed today, no red flags, filling appropriately.   4. Need for immunization against influenza High dose flu vaccine in office   This note was prepared with assistance of Dragon voice recognition software. Occasional wrong-word or sound-a-like substitutions may have occurred due to the inherent limitations of voice recognition software.  Time Spent: 39 minutes of total time was spent on the date of the encounter performing the following actions: chart review prior to seeing the patient, obtaining history, performing a medically necessary exam, counseling on the treatment plan, placing orders, and documenting in our EHR.    Chesnie Capell M Emika Tiano, PA-C

## 2021-08-01 ENCOUNTER — Other Ambulatory Visit: Payer: Self-pay | Admitting: Family Medicine

## 2021-08-01 DIAGNOSIS — Z8673 Personal history of transient ischemic attack (TIA), and cerebral infarction without residual deficits: Secondary | ICD-10-CM

## 2021-08-14 ENCOUNTER — Telehealth: Payer: Self-pay | Admitting: Physician Assistant

## 2021-08-14 NOTE — Telephone Encounter (Signed)
Copied from CRM [id number]. Topic: Medicare AWV >> Aug 14, 2021 10:32 AM Harris-Coley, Avon Gully wrote: Reason for CRM: Left message for patient to schedule Annual Wellness Visit.  Please schedule with Nurse Health Advisor Lanier Ensign, RN at [facility].  Please call [phone] ask for [facility]

## 2021-08-23 ENCOUNTER — Other Ambulatory Visit: Payer: Self-pay | Admitting: Family Medicine

## 2021-08-23 NOTE — Telephone Encounter (Signed)
Patient has established with a new PCP, will refuse this medication.  Requested Prescriptions  Pending Prescriptions Disp Refills  . simvastatin (ZOCOR) 20 MG tablet [Pharmacy Med Name: Simvastatin 20 MG Oral Tablet] 90 tablet 3    Sig: TAKE 1 TABLET BY MOUTH  DAILY     Cardiovascular:  Antilipid - Statins Passed - 08/23/2021  5:36 AM      Passed - Total Cholesterol in normal range and within 360 days    Cholesterol, Total  Date Value Ref Range Status  09/19/2020 197 100 - 199 mg/dL Final         Passed - LDL in normal range and within 360 days    LDL Chol Calc (NIH)  Date Value Ref Range Status  09/19/2020 74 0 - 99 mg/dL Final         Passed - HDL in normal range and within 360 days    HDL  Date Value Ref Range Status  09/19/2020 111 >39 mg/dL Final         Passed - Triglycerides in normal range and within 360 days    Triglycerides  Date Value Ref Range Status  09/19/2020 69 0 - 149 mg/dL Final         Passed - Patient is not pregnant      Passed - Valid encounter within last 12 months    Recent Outpatient Visits          5 months ago Essential (primary) hypertension   [facility] Malva Limes, MD   11 months ago Essential (primary) hypertension   [facility], Demetrios Isaacs, MD   1 year ago History of CVA (cerebrovascular accident)   [facility] Malva Limes, MD   1 year ago Essential (primary) hypertension   [facility] Malva Limes, MD   1 year ago Gastroesophageal reflux disease without esophagitis   [facility], Demetrios Isaacs, MD      Future Appointments            In 3 weeks  [facility] [place]   In 3 weeks Fisher, Demetrios Isaacs, MD [facility], [facility]   In 1 month Allwardt, Crist Infante, PA-C [facility] [place], [facility]

## 2021-08-29 ENCOUNTER — Other Ambulatory Visit: Payer: Self-pay | Admitting: Family Medicine

## 2021-08-29 NOTE — Telephone Encounter (Signed)
Requested medication (s) are due for refill today: no  Requested medication (s) are on the active medication list: yes  Last refill:  12/17/20 #180 4 RF  Future visit scheduled: yes  Notes to clinic:  med not delegated to NT to RF- Now a pt a [facility]   Requested Prescriptions  Pending Prescriptions Disp Refills   carbamazepine (TEGRETOL) 200 MG tablet [Pharmacy Med Name: CARBAMAZEPINE 200MG  TABLETS] 180 tablet 4    Sig: TAKE 1 TABLET BY MOUTH TWICE DAILY     Not Delegated - Neurology:  Anticonvulsants - carbamazepine Failed - 08/29/2021  7:09 AM      Failed - This refill cannot be delegated      Failed - AST in normal range and within 90 days    AST  Date Value Ref Range Status  01/01/2021 39 15 - 41 U/L Final    Comment:    HEMOLYSIS AT THIS LEVEL MAY AFFECT RESULT   SGOT(AST)  Date Value Ref Range Status  01/31/2015 28 U/L Final    Comment:    15-41 NOTE: New Reference Range  12/17/14           Failed - ALT in normal range and within 90 days    ALT  Date Value Ref Range Status  01/01/2021 25 0 - 44 U/L Final    Comment:    HEMOLYSIS AT THIS LEVEL MAY AFFECT RESULT   SGPT (ALT)  Date Value Ref Range Status  01/31/2015 18 U/L Final    Comment:    14-54 NOTE: New Reference Range  12/17/14           Failed - Carbamazepine (serum) in normal range and within 360 days    Carbamazepine Lvl  Date Value Ref Range Status  06/03/2019 10.8 4.0 - 12.0 ug/mL Final    Comment:    Performed at [facility], [address] [place], [place] [address]          Failed - WBC in normal range and within 90 days    WBC  Date Value Ref Range Status  05/25/2021 6.0 4.0 - 10.5 K/uL Final          Failed - PLT in normal range and within 90 days    Platelets  Date Value Ref Range Status  05/25/2021 223 150 - 400 K/uL Final  03/17/2021 213 150 - 450 x10E3/uL Final          Failed - HGB in normal range and within 90 days    Hemoglobin  Date Value Ref  Range Status  05/25/2021 12.9 12.0 - 15.0 g/dL Final  03/17/2021 12.5 11.1 - 15.9 g/dL Final          Failed - Na in normal range and within 90 days    Sodium  Date Value Ref Range Status  05/25/2021 138 135 - 145 mmol/L Final  03/17/2021 143 134 - 144 mmol/L Final  01/31/2015 138 mmol/L Final    Comment:    135-145 NOTE: New Reference Range  12/17/14           Failed - HCT in normal range and within 90 days    HCT  Date Value Ref Range Status  05/25/2021 37.9 36.0 - 46.0 % Final   Hematocrit  Date Value Ref Range Status  03/17/2021 37.0 34.0 - 46.6 % Final          Passed - Valid encounter within last 12 months    Recent  Outpatient Visits           5 months ago Essential (primary) hypertension   [facility] Malva Limes, MD   11 months ago Essential (primary) hypertension   [facility], Demetrios Isaacs, MD   1 year ago History of CVA (cerebrovascular accident)   [facility] Malva Limes, MD   1 year ago Essential (primary) hypertension   [facility] Malva Limes, MD   1 year ago Gastroesophageal reflux disease without esophagitis   [facility] Malva Limes, MD       Future Appointments             In 2 weeks  [facility] [place], [facility]   In 2 weeks Sherrie Mustache, Demetrios Isaacs, MD [facility], [facility]   In 1 month Allwardt, Crist Infante, PA-C [facility] [place], [place]

## 2021-09-09 ENCOUNTER — Telehealth: Payer: Self-pay

## 2021-09-09 NOTE — Telephone Encounter (Signed)
  Encourage patient to contact the pharmacy for refills or they can request refills through [facility]  LAST APPOINTMENT DATE:07/17/21  NEXT APPOINTMENT DATE:  MEDICATION:nortriptyline (PAMELOR) 10 MG capsule  Is the patient out of medication? no  PHARMACY: [facility] - [place], [place] - [address]  Let patient know to contact pharmacy at the end of the day to make sure medication is ready.  Please notify patient to allow 48-72 hours to process

## 2021-09-10 ENCOUNTER — Other Ambulatory Visit: Payer: Self-pay

## 2021-09-10 MED ORDER — NORTRIPTYLINE HCL 10 MG PO CAPS: 10.0000 mg | ORAL_CAPSULE | Freq: Every day | ORAL | 3 refills | Status: DC

## 2021-09-10 NOTE — Telephone Encounter (Signed)
Rx sent in

## 2021-09-15 ENCOUNTER — Ambulatory Visit: Payer: Medicare Other

## 2021-09-18 ENCOUNTER — Ambulatory Visit: Payer: Self-pay | Admitting: Family Medicine

## 2021-10-15 ENCOUNTER — Ambulatory Visit (INDEPENDENT_AMBULATORY_CARE_PROVIDER_SITE_OTHER): Payer: Medicare Other

## 2021-10-15 ENCOUNTER — Other Ambulatory Visit: Payer: Self-pay

## 2021-10-15 DIAGNOSIS — Z0000 Encounter for general adult medical examination without abnormal findings: Secondary | ICD-10-CM

## 2021-10-15 NOTE — Progress Notes (Signed)
Virtual Visit via Telephone Note  I connected with  Amy Ray on 10/15/21 at 11:00 AM EST by telephone and verified that I am speaking with the correct person using two identifiers.  Medicare Annual Wellness visit completed telephonically due to Covid-19 pandemic.   Persons participating in this call: This Health Coach and this patient.   Location: Patient: Home Provider: Office   I discussed the limitations, risks, security and privacy concerns of performing an evaluation and management service by telephone and the availability of in person appointments. The patient expressed understanding and agreed to proceed.  Unable to perform video visit due to video visit attempted and failed and/or patient does not have video capability.   Some vital signs may be absent or patient reported.   Marzella Schlein, LPN   Subjective:   Amy Ray is a 76 y.o. female who presents for Medicare Annual (Subsequent) preventive examination.  Review of Systems     Cardiac Risk Factors include: advanced age (>65men, >16 women)     Objective:    Today's Vitals   10/15/21 1058  PainSc: 6    There is no height or weight on file to calculate BMI.  Advanced Directives 10/15/2021 03/19/2021 01/01/2021 12/30/2020 11/06/2020 07/02/2020 06/17/2020  Does Patient Have a Medical Advance Directive? No No No No No No No  Would patient like information on creating a medical advance directive? No - Patient declined No - Patient declined No - Patient declined - No - Patient declined No - Patient declined No - Patient declined    Current Medications (verified) Outpatient Encounter Medications as of 10/15/2021  Medication Sig   Acetaminophen (TYLENOL EX ST ARTHRITIS PAIN PO) Take 2 tablets by mouth daily as needed (for arthritis pain).   ALPRAZolam (XANAX) 0.25 MG tablet Take 0.5-1 tablets (0.125-0.25 mg total) by mouth at bedtime as needed. for sleep   alum & mag hydroxide-simeth (MAALOX MAX) 400-400-40 MG/5ML  suspension Take 5 mLs by mouth every 6 (six) hours as needed for indigestion.   aspirin 81 MG tablet Take 1 tablet by mouth daily.   carbamazepine (TEGRETOL) 200 MG tablet Take 1 tablet (200 mg total) by mouth 2 (two) times daily.   clopidogrel (PLAVIX) 75 MG tablet TAKE 1 TABLET BY MOUTH  DAILY   diltiazem (DILT-XR) 180 MG 24 hr capsule TAKE 2 CAPSULES BY MOUTH  DAILY   famotidine (PEPCID) 40 MG tablet TAKE 1 TABLET BY MOUTH AT  BEDTIME   fluticasone (FLONASE) 50 MCG/ACT nasal spray Place 2 sprays into both nostrils daily.   furosemide (LASIX) 20 MG tablet TAKE 1 TABLET BY MOUTH  DAILY AS NEEDED FOR EDEMA   lidocaine (LIDODERM) 5 % Place 1 patch onto the skin daily. Remove & Discard patch within 12 hours or as directed by MD   nortriptyline (PAMELOR) 10 MG capsule Take 1-2 capsules (10-20 mg total) by mouth at bedtime.   psyllium (METAMUCIL) 58.6 % packet Take 1 packet by mouth daily.    simvastatin (ZOCOR) 20 MG tablet TAKE 1 TABLET BY MOUTH  DAILY   VITAMIN D PO Take by mouth.   cyanocobalamin 1000 MCG tablet Take 1,000 mcg by mouth daily. (Patient not taking: Reported on 10/15/2021)   docusate sodium (COLACE) 100 MG capsule Take 100 mg by mouth 2 (two) times daily as needed for mild constipation. (Patient not taking: Reported on 03/17/2021)   IRON PO Take 1 tablet by mouth daily. (Patient not taking: Reported on 10/15/2021)   polyethylene glycol (MIRALAX /  GLYCOLAX) 17 g packet Take 17 g by mouth daily as needed. (Patient not taking: Reported on 10/15/2021)   No facility-administered encounter medications on file as of 10/15/2021.    Allergies (verified) Augmentin [amoxicillin-pot clavulanate] and Quinapril-hydrochlorothiazide   History: Past Medical History:  Diagnosis Date   History of diverticulitis of colon 05/20/2015   per patient report    Hyperlipidemia    Hypertension    Stroke (HCC) 03/16/2005   TIA (transient ischemic attack)    Vertigo    Past Surgical History:  Procedure  Laterality Date   CHOLECYSTECTOMY  2000   HERNIA REPAIR     umbilical hernia as a child   Pseudophakia OS Left 02/12/2020   Dr. Clydene PughWoodard   REPLACEMENT TOTAL KNEE Bilateral    Family History  Problem Relation Age of Onset   Heart attack Mother    Hypertension Mother    Alzheimer's disease Father    Diabetes Father    Hypertension Sister    Diabetes Brother    Hypertension Sister    Hypertension Sister    Hypertension Sister    Hypertension Sister    Breast cancer Sister [id number]   Breast cancer Maternal Aunt [id number]   Breast cancer Maternal Aunt    Social History   Socioeconomic History   Marital status: Married    Spouse name: Not on file   Number of children: 4   Years of education: Not on file   Highest education level: High school graduate  Occupational History   Occupation: Retired  Tobacco Use   Smoking status: Never   Smokeless tobacco: Never  Building services engineerVaping Use   Vaping Use: Never used  Substance and Sexual Activity   Alcohol use: No    Alcohol/week: 0.0 standard drinks   Drug use: No   Sexual activity: Not on file  Other Topics Concern   Not on file  Social History Narrative   Not on file   Social Determinants of Health   Financial Resource Strain: Low Risk    Difficulty of Paying Living Expenses: Not hard at all  Food Insecurity: No Food Insecurity   Worried About Programme researcher, broadcasting/film/videounning Out of Food in the Last Year: Never true   Ran Out of Food in the Last Year: Never true  Transportation Needs: No Transportation Needs   Lack of Transportation (Medical): No   Lack of Transportation (Non-Medical): No  Physical Activity: Sufficiently Active   Days of Exercise per Week: 5 days   Minutes of Exercise per Session: 30 min  Stress: No Stress Concern Present   Feeling of Stress : Not at all  Social Connections: Moderately Isolated   Frequency of Communication with Friends and Family: More than three times a week   Frequency of Social Gatherings with Friends and Family: More than three  times a week   Attends Religious Services: More than 4 times per year   Active Member of [employer]ubs or Organizations: No   Attends BankerClub or Organization Meetings: Never   Marital Status: Separated    Tobacco Counseling Counseling given: Not Answered   Clinical Intake:  Pre-visit preparation completed: Yes  Pain : 0-10 Pain Score: 6  Pain Type: Chronic pain Pain Location: Back Pain Descriptors / Indicators: Aching Pain Onset: More than a month ago Pain Frequency: Intermittent     BMI - recorded: 39.93 Nutritional Risks: None Diabetes: No  How often do you need to have someone help you when you read instructions, pamphlets, or other written materials from your  doctor or pharmacy?: 1 - Never  Diabetic?no  Interpreter Needed?: No  Information entered by :: Lanier Ensign, LPN   Activities of Daily Living In your present state of health, do you have any difficulty performing the following activities: 10/15/2021  Hearing? Y  Comment HOH  Vision? N  Difficulty concentrating or making decisions? N  Walking or climbing stairs? N  Dressing or bathing? N  Doing errands, shopping? N  Preparing Food and eating ? N  Using the Toilet? N  In the past six months, have you accidently leaked urine? Y  Comment at night  Do you have problems with loss of bowel control? N  Managing your Medications? N  Managing your Finances? N  Housekeeping or managing your Housekeeping? N  Some recent data might be hidden    Patient Care Team: Allwardt, Crist Infante, PA-C as PCP - General (Physician Assistant) Isla Pence, OD as Consulting Physician (Optometry) Deeann Saint, MD (Orthopedic Surgery) Alwyn Pea, MD as Consulting Physician (Cardiology)  Indicate any recent Medical Services you may have received from other than [facility] providers in the past year (date may be approximate).     Assessment:   This is a routine wellness examination for Amy Ray.  Hearing/Vision screen Hearing  Screening - Comments:: Pt stated HOH  Vision Screening - Comments:: Pt follows up with Dr Elmer Picker for annual eye exams   Dietary issues and exercise activities discussed: Current Exercise Habits: Home exercise routine, Type of exercise: Other - see comments (bicycle stationary), Time (Minutes): 30, Frequency (Times/Week): 5, Weekly Exercise (Minutes/Week): 150   Goals Addressed             This Visit's Progress    Patient Stated       None at this time        Depression Screen PHQ 2/9 Scores 10/15/2021 06/27/2020 06/25/2019 06/21/2018 06/16/2017 01/17/2017  PHQ - 2 Score 0 2 0 1 2 0  PHQ- 9 Score - 8 - - 4 3    Fall Risk Fall Risk  10/15/2021 07/17/2021 07/02/2020 06/27/2020 06/25/2019  Falls in the past year? 1 1 0 1 1  Number falls in past yr: 1 1 0 1 1  Injury with Fall? 0 1 0 0 0  Risk for fall due to : Impaired vision;Impaired balance/gait;Impaired mobility History of fall(s) - - -  Follow up Falls prevention discussed Falls evaluation completed - Falls evaluation completed Falls prevention discussed    FALL RISK PREVENTION PERTAINING TO THE HOME:  Any stairs in or around the home? No  If so, are there any without handrails? No  Home free of loose throw rugs in walkways, pet beds, electrical cords, etc? Yes  Adequate lighting in your home to reduce risk of falls? Yes   ASSISTIVE DEVICES UTILIZED TO PREVENT FALLS:  Life alert? Yes  Use of a cane, walker or w/c? No  Grab bars in the bathroom? No  Shower chair or bench in shower? No  Elevated toilet seat or a handicapped toilet? No   TIMED UP AND GO:  Was the test performed? No .  Cognitive Function:     6CIT Screen 10/15/2021 06/25/2019  What Year? 0 points 0 points  What month? 0 points 0 points  What time? - 0 points  Count back from 20 - 0 points  Months in reverse - 0 points  Repeat phrase - 2 points  Total Score - 2    Immunizations Immunization History  Administered Date(s)  Administered   Fluad Quad(high Dose  65+) 06/27/2020, 07/17/2021   Influenza, High Dose Seasonal PF 07/12/2016, 06/16/2017, 06/21/2018, 06/22/2019   Influenza-Unspecified 05/12/2015   Moderna Sars-Covid-2 Vaccination 11/24/2019, 12/24/2019   Pneumococcal Conjugate-13 12/05/2014   Pneumococcal Polysaccharide-23 05/09/2013   Tdap 08/12/2011    TDAP status: Due, Education has been provided regarding the importance of this vaccine. Advised may receive this vaccine at local pharmacy or Health Dept. Aware to provide a copy of the vaccination record if obtained from local pharmacy or Health Dept. Verbalized acceptance and understanding.  Flu Vaccine status: Up to date  Pneumococcal vaccine status: Up to date  Covid-19 vaccine status: Completed vaccines  Qualifies for Shingles Vaccine? Yes   Zostavax completed Yes   Shingrix Completed?: Yes  Screening Tests Health Maintenance  Topic Date Due   Zoster Vaccines- Shingrix (1 of 2) Never done   COVID-19 Vaccine (3 - Moderna risk series) 01/21/2020   TETANUS/TDAP  08/11/2021   Fecal DNA (Cologuard)  08/28/2022   MAMMOGRAM  06/03/2023   DEXA SCAN  08/06/2024   Pneumonia Vaccine 8765+ Years old  Completed   INFLUENZA VACCINE  Completed   Hepatitis C Screening  Completed   HPV VACCINES  Aged Out   COLONOSCOPY (Pts 45-656yrs Insurance coverage will need to be confirmed)  Discontinued    Health Maintenance  Health Maintenance Due  Topic Date Due   Zoster Vaccines- Shingrix (1 of 2) Never done   COVID-19 Vaccine (3 - Moderna risk series) 01/21/2020   TETANUS/TDAP  08/11/2021    Colorectal cancer screening: Type of screening: Cologuard. Completed 08/29/19. Repeat every 3 years  Mammogram status: Completed 06/16/21. Repeat every year  Bone Density status: Completed 08/07/19. Results reflect: Bone density results: NORMAL. Repeat every 5 years.   Additional Screening:  Hepatitis C Screening:  Completed 06/23/18  Vision Screening: Recommended annual ophthalmology exams for  early detection of glaucoma and other disorders of the eye. Is the patient up to date with their annual eye exam?  Yes  Who is the provider or what is the name of the office in which the patient attends annual eye exams? Dr Elmer PickerHecker  If pt is not established with a provider, would they like to be referred to a provider to establish care? No .   Dental Screening: Recommended annual dental exams for proper oral hygiene  Community Resource Referral / Chronic Care Management: CRR required this visit?  No   CCM required this visit?  No      Plan:     I have personally reviewed and noted the following in the patients chart:   Medical and social history Use of alcohol, tobacco or illicit drugs  Current medications and supplements including opioid prescriptions.  Functional ability and status Nutritional status Physical activity Advanced directives List of other physicians Hospitalizations, surgeries, and ER visits in previous 12 months Vitals Screenings to include cognitive, depression, and falls Referrals and appointments  In addition, I have reviewed and discussed with patient certain preventive protocols, quality metrics, and best practice recommendations. A written personalized care plan for preventive services as well as general preventive health recommendations were provided to patient.     Marzella Schleinina H Dylin Breeden, LPN   1/6/10961/02/2022   Nurse Notes: None

## 2021-10-15 NOTE — Patient Instructions (Addendum)
Amy Ray , Thank you for taking time to come for your Medicare Wellness Visit. I appreciate your ongoing commitment to your health goals. Please review the following plan we discussed and let me know if I can assist you in the future.   Screening recommendations/referrals: Colonoscopy: Cologuard 08/29/19 repeat every 3 years  Mammogram: Done 06/16/21 repeat every year  Bone Density: Done 08/07/19 repeat every 2 years  Recommended yearly ophthalmology/optometry visit for glaucoma screening and checkup Recommended yearly dental visit for hygiene and checkup  Vaccinations: Influenza vaccine: Done 07/17/21 repeat every year  Pneumococcal vaccine: Up to date Tdap vaccine: Due  Shingles vaccine: pt stated completed will bring information in next appt    Covid-19:Completed 2/13, & 3/15  Advanced directives: Advance directive discussed with you today. Even though you declined this today please call our office should you change your mind and we can give you the proper paperwork for you to fill out.  Conditions/risks identified: none at this time   Next appointment: Follow up in one year for your annual wellness visit    Preventive Care 65 Years and Older, Female Preventive care refers to lifestyle choices and visits with your health care provider that can promote health and wellness. What does preventive care include? A yearly physical exam. This is also called an annual well check. Dental exams once or twice a year. Routine eye exams. Ask your health care provider how often you should have your eyes checked. Personal lifestyle choices, including: Daily care of your teeth and gums. Regular physical activity. Eating a healthy diet. Avoiding tobacco and drug use. Limiting alcohol use. Practicing safe sex. Taking low-dose aspirin every day. Taking vitamin and mineral supplements as recommended by your health care provider. What happens during an annual well check? The services and  screenings done by your health care provider during your annual well check will depend on your age, overall health, lifestyle risk factors, and family history of disease. Counseling  Your health care provider may ask you questions about your: Alcohol use. Tobacco use. Drug use. Emotional well-being. Home and relationship well-being. Sexual activity. Eating habits. History of falls. Memory and ability to understand (cognition). Work and work Astronomer. Reproductive health. Screening  You may have the following tests or measurements: Height, weight, and BMI. Blood pressure. Lipid and cholesterol levels. These may be checked every 5 years, or more frequently if you are over 75 years old. Skin check. Lung cancer screening. You may have this screening every year starting at age 52 if you have a 30-pack-year history of smoking and currently smoke or have quit within the past 15 years. Fecal occult blood test (FOBT) of the stool. You may have this test every year starting at age 70. Flexible sigmoidoscopy or colonoscopy. You may have a sigmoidoscopy every 5 years or a colonoscopy every 10 years starting at age 59. Hepatitis C blood test. Hepatitis B blood test. Sexually transmitted disease (STD) testing. Diabetes screening. This is done by checking your blood sugar (glucose) after you have not eaten for a while (fasting). You may have this done every 1-3 years. Bone density scan. This is done to screen for osteoporosis. You may have this done starting at age 19. Mammogram. This may be done every 1-2 years. Talk to your health care provider about how often you should have regular mammograms. Talk with your health care provider about your test results, treatment options, and if necessary, the need for more tests. Vaccines  Your health care provider  may recommend certain vaccines, such as: Influenza vaccine. This is recommended every year. Tetanus, diphtheria, and acellular pertussis (Tdap,  Td) vaccine. You may need a Td booster every 10 years. Zoster vaccine. You may need this after age 45. Pneumococcal 13-valent conjugate (PCV13) vaccine. One dose is recommended after age 72. Pneumococcal polysaccharide (PPSV23) vaccine. One dose is recommended after age 43. Talk to your health care provider about which screenings and vaccines you need and how often you need them. This information is not intended to replace advice given to you by your health care provider. Make sure you discuss any questions you have with your health care provider. Document Released: 10/24/2015 Document Revised: 06/16/2016 Document Reviewed: 07/29/2015 Elsevier Interactive Patient Education  2017 [employer].  Fall Prevention in the Home Falls can cause injuries. They can happen to people of all ages. There are many things you can do to make your home safe and to help prevent falls. What can I do on the outside of my home? Regularly fix the edges of walkways and driveways and fix any cracks. Remove anything that might make you trip as you walk through a door, such as a raised step or threshold. Trim any bushes or trees on the path to your home. Use bright outdoor lighting. Clear any walking paths of anything that might make someone trip, such as rocks or tools. Regularly check to see if handrails are loose or broken. Make sure that both sides of any steps have handrails. Any raised decks and porches should have guardrails on the edges. Have any leaves, snow, or ice cleared regularly. Use sand or salt on walking paths during winter. Clean up any spills in your garage right away. This includes oil or grease spills. What can I do in the bathroom? Use night lights. Install grab bars by the toilet and in the tub and shower. Do not use towel bars as grab bars. Use non-skid mats or decals in the tub or shower. If you need to sit down in the shower, use a plastic, non-slip stool. Keep the floor dry. Clean up any  water that spills on the floor as soon as it happens. Remove soap buildup in the tub or shower regularly. Attach bath mats securely with double-sided non-slip rug tape. Do not have throw rugs and other things on the floor that can make you trip. What can I do in the bedroom? Use night lights. Make sure that you have a light by your bed that is easy to reach. Do not use any sheets or blankets that are too big for your bed. They should not hang down onto the floor. Have a firm chair that has side arms. You can use this for support while you get dressed. Do not have throw rugs and other things on the floor that can make you trip. What can I do in the kitchen? Clean up any spills right away. Avoid walking on wet floors. Keep items that you use a lot in easy-to-reach places. If you need to reach something above you, use a strong step stool that has a grab bar. Keep electrical cords out of the way. Do not use floor polish or wax that makes floors slippery. If you must use wax, use non-skid floor wax. Do not have throw rugs and other things on the floor that can make you trip. What can I do with my stairs? Do not leave any items on the stairs. Make sure that there are handrails on both sides  of the stairs and use them. Fix handrails that are broken or loose. Make sure that handrails are as long as the stairways. Check any carpeting to make sure that it is firmly attached to the stairs. Fix any carpet that is loose or worn. Avoid having throw rugs at the top or bottom of the stairs. If you do have throw rugs, attach them to the floor with carpet tape. Make sure that you have a light switch at the top of the stairs and the bottom of the stairs. If you do not have them, ask someone to add them for you. What else can I do to help prevent falls? Wear shoes that: Do not have high heels. Have rubber bottoms. Are comfortable and fit you well. Are closed at the toe. Do not wear sandals. If you use a  stepladder: Make sure that it is fully opened. Do not climb a closed stepladder. Make sure that both sides of the stepladder are locked into place. Ask someone to hold it for you, if possible. Clearly mark and make sure that you can see: Any grab bars or handrails. First and last steps. Where the edge of each step is. Use tools that help you move around (mobility aids) if they are needed. These include: Canes. Walkers. Scooters. Crutches. Turn on the lights when you go into a dark area. Replace any light bulbs as soon as they burn out. Set up your furniture so you have a clear path. Avoid moving your furniture around. If any of your floors are uneven, fix them. If there are any pets around you, be aware of where they are. Review your medicines with your doctor. Some medicines can make you feel dizzy. This can increase your chance of falling. Ask your doctor what other things that you can do to help prevent falls. This information is not intended to replace advice given to you by your health care provider. Make sure you discuss any questions you have with your health care provider. Document Released: 07/24/2009 Document Revised: 03/04/2016 Document Reviewed: 11/01/2014 Elsevier Interactive Patient Education  2017 [employer].

## 2021-10-19 ENCOUNTER — Other Ambulatory Visit: Payer: Self-pay

## 2021-10-19 ENCOUNTER — Ambulatory Visit (INDEPENDENT_AMBULATORY_CARE_PROVIDER_SITE_OTHER): Payer: Medicare Other | Admitting: Physician Assistant

## 2021-10-19 ENCOUNTER — Other Ambulatory Visit (INDEPENDENT_AMBULATORY_CARE_PROVIDER_SITE_OTHER): Payer: Medicare Other

## 2021-10-19 VITALS — BP 148/87 | HR 89 | Temp 97.6°F | Ht 61.0 in | Wt 201.2 lb

## 2021-10-19 DIAGNOSIS — R441 Visual hallucinations: Secondary | ICD-10-CM | POA: Diagnosis not present

## 2021-10-19 DIAGNOSIS — R413 Other amnesia: Secondary | ICD-10-CM

## 2021-10-19 DIAGNOSIS — I10 Essential (primary) hypertension: Secondary | ICD-10-CM

## 2021-10-19 NOTE — Progress Notes (Signed)
Subjective:    Patient ID: Amy Ray, female    DOB: 12-Apr-1946, 76 y.o.   MRN: [id number]  Chief Complaint  Patient presents with   Hypertension    HPI Patient is in today for recheck primarily of hypertension.  She is here with her daughter, [name].   HTN: -She has been checking with a wrist cuff at home. -She has been taking diltiazem XR 180 mg. -She has some readings with her today that include numbers ranging from 130s to 140s over 80s. -She does not have any headaches or chest pain.  No shortness of breath.  No dizziness.  No other symptoms associated with her hypertension.  More concerning, patient is very distraught about "Seeing bugs": -For the last few months - in food, in stool, in carpet; she has thrown out clothes and furniture; exterminator says she doesn't have any bugs -Patient says just this morning she swept up a bunch and had to throw them in the garbage. -Sometimes yellow, then black and white.  Some have wings.  Some look like they are yellow eggs.  She thinks they have been crawling on her and causing little bite marks. -No new medications or foods, no new OTC -Daughter states that she has looked and she has not seen any of these bugs.  She is also concerned that her mom seems to be having some memory impairment.   Past Medical History:  Diagnosis Date   History of diverticulitis of colon 05/20/2015   per patient report    Hyperlipidemia    Hypertension    Stroke (HCC) 03/16/2005   TIA (transient ischemic attack)    Vertigo     Past Surgical History:  Procedure Laterality Date   CHOLECYSTECTOMY  2000   HERNIA REPAIR     umbilical hernia as a child   Pseudophakia OS Left 02/12/2020   Dr. Clydene Pugh   REPLACEMENT TOTAL KNEE Bilateral     Family History  Problem Relation Age of Onset   Heart attack Mother    Hypertension Mother    Alzheimer's disease Father    Diabetes Father    Hypertension Sister    Diabetes Brother    Hypertension  Sister    Hypertension Sister    Hypertension Sister    Hypertension Sister    Breast cancer Sister 54   Breast cancer Maternal Aunt 58   Breast cancer Maternal Aunt     Social History   Tobacco Use   Smoking status: Never   Smokeless tobacco: Never  Vaping Use   Vaping Use: Never used  Substance Use Topics   Alcohol use: No    Alcohol/week: 0.0 standard drinks   Drug use: No     Allergies  Allergen Reactions   Augmentin [Amoxicillin-Pot Clavulanate] Hives   Quinapril-Hydrochlorothiazide Other (See Comments)    Other reaction(s): Headache    Review of Systems NEGATIVE UNLESS OTHERWISE INDICATED IN HPI      Objective:     BP (!) 148/87    Pulse 89    Temp 97.6 F (36.4 C)    Ht 5\' 1"  (1.549 m)    Wt 201 lb 4 oz (91.3 kg)    SpO2 100%    BMI 38.03 kg/m   Wt Readings from Last 3 Encounters:  10/19/21 201 lb 4 oz (91.3 kg)  07/17/21 211 lb 3.2 oz (95.8 kg)  05/25/21 205 lb (93 kg)    BP Readings from Last 3 Encounters:  10/19/21 (!) 148/87  07/17/21 122/76  05/25/21 (!) 141/93     Physical Exam Vitals and nursing note reviewed.  Constitutional:      Appearance: Normal appearance. She is obese. She is not toxic-appearing.     Comments: Using a walker  HENT:     Head: Normocephalic and atraumatic.     Right Ear: Tympanic membrane, ear canal and external ear normal.     Left Ear: Tympanic membrane, ear canal and external ear normal.     Nose: Nose normal.     Mouth/Throat:     Mouth: Mucous membranes are moist.  Eyes:     Extraocular Movements: Extraocular movements intact.     Conjunctiva/sclera: Conjunctivae normal.     Pupils: Pupils are equal, round, and reactive to light.  Cardiovascular:     Rate and Rhythm: Normal rate and regular rhythm.     Pulses: Normal pulses.     Heart sounds: Normal heart sounds.  Pulmonary:     Effort: Pulmonary effort is normal.     Breath sounds: Normal breath sounds.  Musculoskeletal:        General: Normal range  of motion.     Cervical back: Normal range of motion and neck supple.  Skin:    General: Skin is warm and dry.  Neurological:     General: No focal deficit present.     Mental Status: She is alert and oriented to person, place, and time.     Sensory: Sensory deficit (left side of face) present.  Psychiatric:        Mood and Affect: Mood normal.        Behavior: Behavior normal.        Thought Content: Thought content normal.        Judgment: Judgment normal.       Assessment & Plan:   Problem List Items Addressed This Visit       Cardiovascular and Mediastinum   Essential (primary) hypertension   Relevant Orders   MR BRAIN WO CONTRAST   RPR (Completed)   HIV Antibody (routine testing w rflx) (Completed)   Vitamin B12 (Completed)   TSH (Completed)   Comprehensive metabolic panel (Completed)   CBC with Differential/Platelet (Completed)   Urine Culture   POCT urinalysis dipstick   Other Visit Diagnoses     Visual hallucinations    -  Primary   Relevant Orders   MR BRAIN WO CONTRAST   RPR (Completed)   HIV Antibody (routine testing w rflx) (Completed)   Vitamin B12 (Completed)   TSH (Completed)   Comprehensive metabolic panel (Completed)   CBC with Differential/Platelet (Completed)   Urine Culture   POCT urinalysis dipstick   Memory changes       Relevant Orders   MR BRAIN WO CONTRAST   RPR (Completed)   HIV Antibody (routine testing w rflx) (Completed)   Vitamin B12 (Completed)   TSH (Completed)   Comprehensive metabolic panel (Completed)   CBC with Differential/Platelet (Completed)   Urine Culture   POCT urinalysis dipstick       1. Visual hallucinations 2. Memory changes Description of history in the last few months from patient and her daughter very concerning for possible psychosis versus Lewy body dementia versus Alzheimer's versus other.  Her last MRI was 05/25/2021 and this showed no acute intracranial changes but did show moderate chronic small  vessel ischemic disease and volume loss.  With her sudden changes since that MRI, we will proceed with new MRI brain  at this time.  I will also go ahead with urgent neurology referral to help sort this out for them.  Several labs will be checked today as well.  ED precautions advised.  3. Essential (primary) hypertension Patient's blood pressure is elevated today.  I asked her to continue monitoring this at home and for right now we will continue on the diltiazem gram daily.  She needs to continue to work on a low-salt diet and walk as she is able encourage plenty of water as well.   Plan to have close follow-up with her in just a few weeks.  Sooner if needed.   This note was prepared with assistance of Systems analyst. Occasional wrong-word or sound-a-like substitutions may have occurred due to the inherent limitations of voice recognition software.  Time Spent: 42 minutes of total time was spent on the date of the encounter performing the following actions: chart review prior to seeing the patient, obtaining history, performing a medically necessary exam, counseling on the treatment plan, placing orders, and documenting in our EHR.    Bethany Cumming M Tiaja Hagan, PA-C

## 2021-10-20 ENCOUNTER — Encounter (HOSPITAL_COMMUNITY): Payer: Self-pay

## 2021-10-20 ENCOUNTER — Ambulatory Visit (HOSPITAL_COMMUNITY)
Admission: RE | Admit: 2021-10-20 | Discharge: 2021-10-20 | Disposition: A | Discharge status: Home / Self Care | Payer: Medicare Other | Source: Ambulatory Visit | Attending: Physician Assistant | Admitting: Physician Assistant

## 2021-10-20 DIAGNOSIS — R441 Visual hallucinations: Secondary | ICD-10-CM

## 2021-10-20 DIAGNOSIS — R413 Other amnesia: Secondary | ICD-10-CM

## 2021-10-20 DIAGNOSIS — I10 Essential (primary) hypertension: Secondary | ICD-10-CM

## 2021-10-20 LAB — URINE CULTURE
MICRO NUMBER:: 12844881
SPECIMEN QUALITY:: ADEQUATE

## 2021-10-20 LAB — CBC WITH DIFFERENTIAL/PLATELET
Basophils Absolute: 0 10*3/uL (ref 0.0–0.1)
Basophils Relative: 0.4 % (ref 0.0–3.0)
Eosinophils Absolute: 0.1 10*3/uL (ref 0.0–0.7)
Eosinophils Relative: 1.6 % (ref 0.0–5.0)
HCT: 38 % (ref 36.0–46.0)
Hemoglobin: 12.5 g/dL (ref 12.0–15.0)
Lymphocytes Relative: 16.5 % (ref 12.0–46.0)
Lymphs Abs: 1.2 10*3/uL (ref 0.7–4.0)
MCHC: 32.9 g/dL (ref 30.0–36.0)
MCV: 101.4 fl — ABNORMAL HIGH (ref 78.0–100.0)
Monocytes Absolute: 0.7 10*3/uL (ref 0.1–1.0)
Monocytes Relative: 9.8 % (ref 3.0–12.0)
Neutro Abs: 5.3 10*3/uL (ref 1.4–7.7)
Neutrophils Relative %: 71.7 % (ref 43.0–77.0)
Platelets: 212 10*3/uL (ref 150.0–400.0)
RBC: 3.75 Mil/uL — ABNORMAL LOW (ref 3.87–5.11)
RDW: 12.5 % (ref 11.5–15.5)
WBC: 7.4 10*3/uL (ref 4.0–10.5)

## 2021-10-20 LAB — COMPREHENSIVE METABOLIC PANEL
ALT: 14 U/L (ref 0–35)
AST: 22 U/L (ref 0–37)
Albumin: 4.2 g/dL (ref 3.5–5.2)
Alkaline Phosphatase: 116 U/L (ref 39–117)
BUN: 16 mg/dL (ref 6–23)
CO2: 26 mEq/L (ref 19–32)
Calcium: 9.5 mg/dL (ref 8.4–10.5)
Chloride: 103 mEq/L (ref 96–112)
Creatinine, Ser: 1 mg/dL (ref 0.40–1.20)
GFR: 55.51 mL/min — ABNORMAL LOW (ref >60.00)
Glucose, Bld: 86 mg/dL (ref 70–99)
Potassium: 4.3 mEq/L (ref 3.5–5.1)
Sodium: 140 mEq/L (ref 135–145)
Total Bilirubin: 0.5 mg/dL (ref 0.2–1.2)
Total Protein: 7.9 g/dL (ref 6.0–8.3)

## 2021-10-20 LAB — TSH: TSH: 1.6 u[IU]/mL (ref 0.35–5.50)

## 2021-10-20 LAB — VITAMIN B12: Vitamin B-12: 588 pg/mL (ref 211–911)

## 2021-10-21 ENCOUNTER — Telehealth: Payer: Self-pay

## 2021-10-21 LAB — RPR: RPR Ser Ql: NONREACTIVE

## 2021-10-21 LAB — HIV ANTIBODY (ROUTINE TESTING W REFLEX): HIV 1&2 Ab, 4th Generation: NONREACTIVE

## 2021-10-21 NOTE — Telephone Encounter (Signed)
Patient called back requesting results of MRI.    States she is concerned b/c medication refills are contingent on the MRI.

## 2021-10-22 NOTE — Telephone Encounter (Signed)
Daughter called in stating that patient has not had MRI completed yet b/c patient is claustrophobic.   States MRI has been moved to Sunday. Would like to know if Allwardt would send medication to [facility] on [address].

## 2021-10-23 ENCOUNTER — Telehealth: Payer: Self-pay | Admitting: Physician Assistant

## 2021-10-23 ENCOUNTER — Other Ambulatory Visit: Payer: Self-pay | Admitting: Physician Assistant

## 2021-10-23 MED ORDER — LORAZEPAM 0.5 MG PO TABS: ORAL_TABLET | ORAL | 0 refills | Status: DC

## 2021-10-23 NOTE — Telephone Encounter (Signed)
Pt's daughter called in stating she missed a call regarding labs. I also see Allwardt stated she wanted to speak to the daughter in a previous note.

## 2021-10-25 ENCOUNTER — Other Ambulatory Visit: Payer: Self-pay | Admitting: Physician Assistant

## 2021-10-25 ENCOUNTER — Ambulatory Visit (HOSPITAL_COMMUNITY)
Admission: RE | Admit: 2021-10-25 | Discharge: 2021-10-25 | Disposition: A | Discharge status: Home / Self Care | Payer: Medicare Other | Source: Ambulatory Visit | Attending: Physician Assistant | Admitting: Physician Assistant

## 2021-10-25 DIAGNOSIS — I10 Essential (primary) hypertension: Secondary | ICD-10-CM | POA: Insufficient documentation

## 2021-10-25 DIAGNOSIS — R413 Other amnesia: Secondary | ICD-10-CM | POA: Insufficient documentation

## 2021-10-25 DIAGNOSIS — R441 Visual hallucinations: Secondary | ICD-10-CM | POA: Diagnosis not present

## 2021-10-25 DIAGNOSIS — G4700 Insomnia, unspecified: Secondary | ICD-10-CM

## 2021-10-27 NOTE — Telephone Encounter (Signed)
Spoke with daughter she voices understanding

## 2021-10-27 NOTE — Telephone Encounter (Signed)
Notified. 

## 2021-11-06 ENCOUNTER — Other Ambulatory Visit: Payer: Self-pay | Admitting: Family Medicine

## 2021-11-06 NOTE — Telephone Encounter (Signed)
Patient has new PCP

## 2021-11-06 NOTE — Telephone Encounter (Signed)
Requested medication (s) are due for refill today: yes  Requested medication (s) are on the active medication list: yes  Last refill:  12/13/20 #90/2  Future visit scheduled: no  Notes to clinic:  Unable to refill per protocol due to failed labs, no updated results.      Requested Prescriptions  Pending Prescriptions Disp Refills   simvastatin (ZOCOR) 20 MG tablet [Pharmacy Med Name: Simvastatin 20 MG Oral Tablet] 90 tablet 3    Sig: TAKE 1 TABLET BY MOUTH  DAILY     Cardiovascular:  Antilipid - Statins Failed - 11/06/2021 11:44 AM      Failed - Total Cholesterol in normal range and within 360 days    Cholesterol, Total  Date Value Ref Range Status  09/19/2020 197 100 - 199 mg/dL Final          Failed - LDL in normal range and within 360 days    LDL Chol Calc (NIH)  Date Value Ref Range Status  09/19/2020 74 0 - 99 mg/dL Final          Failed - HDL in normal range and within 360 days    HDL  Date Value Ref Range Status  09/19/2020 111 >39 mg/dL Final          Failed - Triglycerides in normal range and within 360 days    Triglycerides  Date Value Ref Range Status  09/19/2020 69 0 - 149 mg/dL Final          Passed - Patient is not pregnant      Passed - Valid encounter within last 12 months    Recent Outpatient Visits           7 months ago Essential (primary) hypertension   [facility] Malva Limes, MD   1 year ago Essential (primary) hypertension   [facility], Demetrios Isaacs, MD   1 year ago History of CVA (cerebrovascular accident)   [facility] Malva Limes, MD   1 year ago Essential (primary) hypertension   [facility] Malva Limes, MD   1 year ago Gastroesophageal reflux disease without esophagitis   [facility], Demetrios Isaacs, MD

## 2021-11-10 NOTE — Progress Notes (Signed)
Assessment/Plan:   Amy Ray is a very pleasant 76 y.o. year old RH female with  a history of hypertension, hyperlipidemia, insomnia, history of CVA 2006,  trigeminal neuralgia 2005 vertigo, Vit D deficiency, anxiety, seen today for evaluation of memory loss. MoCA today is 18/30, with deficiencies in visuospatial, serial 7s, naming, abstraction, delayed recall 1/5, orientation 5/6.  Findings are suspicious for dementia likely mixed vascular and Alzheimer's disease.  No parkinsonian features are noted today on exam.  Patient has a history of severe anxiety, and is to be seen by psychiatry soon.     Recommendations:   Dementia likely mixed vascular and Alzheimer's with behavioral disturbance  Neurocognitive testing to further evaluate cognitive concerns and determine other underlying cause of memory changes, including potential contribution from sleep, anxiety, or depression  Will entertain antidementia medication after Neurocognitive testing and Psych evaluation   Agree with Psychiatry referral  Discussed safety both in and out of the home.  Discussed the importance of regular daily schedule with inclusion of crossword puzzles to maintain brain function.  Continue to monitor mood with PCP.  Stay active at least 30 minutes at least 3 times a week.  Naps should be scheduled and should be no longer than 60 minutes and should not occur after 2 PM.  Control cardiovascular risk factors  Mediterranean diet is recommended  Folllow up 3 months    Subjective:    The patient is seen in neurologic consultation at the request of Allwardt, Crist Infante, PA-C for the evaluation of memory.  The patient is accompanied by her daughter who supplements the history. This is a 76 y.o. year old RH  female who has had memory issues for about 4 or 5 years, when she began to forget "little things ".  Specifically, she began to forget some conversations, and the symptoms became worse over the last month.  2 weeks  ago, she began to experience hallucinations, seeing "bugs everywhere in the food, stool, carpet ".  She called an exterminator, who noticed that there were no bugs in the house.  She kept repeating that she threw them in the garbage, some of them were yellow, some black-and-white, and they have wings.  She was convinced that they were crawling, and causing her "little bite marks ".  Because of these, she threw clothes and furniture to the garbage.  She reports that about 2 weeks ago "they disappeared ".  The patient lives alone, but her other daughter who is not present today, visits her daily.  Her daughter reported some confusion when the patient is entering the room, but she does not repeat the same stories or asked the same questions.  She does leave objects in different places around the house and then cannot remember where they are.  She continues to drive short distances, and denies feeling lost.  Her mood is anxious at times.  Yesterday, she called life alert because her "heart was racing, and it was felt to be due to anxiety.  She took Xanax, and she was okay "-her daughter says.  She denies depression or irritability.  She likes to do crossword puzzles and word finding.  She sleeps "okay ".  She may have some REM behavior during the night, denies sleep walking.  Denies paranoia.  She may be slightly hesitant to shower, needs assistance.  There are no concerns about her dressing.  Her daughter places the medications in a pillbox, but she may be missing some doses.  Her  daughter monitors the finances.  Her appetite is good, denies trouble swallowing.  She cooks and denies leaving the stove or the faucet on.  She ambulates with a walker and a cane.  She had a fall about 6 weeks ago, mechanical, denies any head injuries.  She started to "lean to the right because of arthritis ".  She denies any headaches, double vision, dizziness, she has permanent left side facial paresthesia since 2006 after her stroke.  She  denies any new tingling, or unilateral weakness.  She has very mild tremors on the right hand, denies anosmia.  No history of seizures.  She has mild urine incontinence, and uses a pad.  She denies constipation or diarrhea.  Denies sleep apnea, alcohol or tobacco.  Family history remarkable for father with Alzheimer's disease.  She is a retired Associate Professorpost office employee.    MRI brain 10/25/2020 1. No specific or reversible finding to explain symptoms. 2. Moderate to extensive chronic small vessel ischemia in the hemispheric white matter.   Labs 10/2021 Vit B12 588, TSH 1.6  CbC MCV 101.4 ( mildly elevated)     Allergies  Allergen Reactions   Augmentin [Amoxicillin-Pot Clavulanate] Hives   Quinapril-Hydrochlorothiazide Other (See Comments)    Other reaction(s): Headache    Current Outpatient Medications  Medication Instructions   Acetaminophen (TYLENOL EX ST ARTHRITIS PAIN PO) 2 tablets, Oral, Daily PRN   ALPRAZolam (XANAX) 0.125-0.25 mg, Oral, At bedtime PRN, for sleep   aspirin 81 MG tablet 1 tablet, Oral, Daily   carbamazepine (TEGRETOL) 200 mg, Oral, 2 times daily   clopidogrel (PLAVIX) 75 MG tablet TAKE 1 TABLET BY MOUTH  DAILY   diltiazem (DILT-XR) 180 MG 24 hr capsule TAKE 2 CAPSULES BY MOUTH  DAILY   famotidine (PEPCID) 40 MG tablet TAKE 1 TABLET BY MOUTH AT  BEDTIME   fluticasone (FLONASE) 50 MCG/ACT nasal spray 2 sprays, Each Nare, Daily   furosemide (LASIX) 20 MG tablet TAKE 1 TABLET BY MOUTH  DAILY AS NEEDED FOR EDEMA   nortriptyline (PAMELOR) 10-20 mg, Oral, Daily at bedtime   simvastatin (ZOCOR) 20 MG tablet TAKE 1 TABLET BY MOUTH  DAILY   VITAMIN D PO Oral     VITALS:   Vitals:   11/11/21 0813  BP: 137/79  Pulse: 97  SpO2: 97%  Weight: 203 lb (92.1 kg)  Height: 5' (1.524 m)    PHYSICAL EXAM   HEENT:  Normocephalic, atraumatic. The mucous membranes are moist. The superficial temporal arteries are without ropiness or tenderness. Cardiovascular: Regular rate and  rhythm. Lungs: Clear to auscultation bilaterally. Neck: There are no carotid bruits noted bilaterally.  NEUROLOGICAL: Montreal Cognitive Assessment  11/11/2021  Visuospatial/ Executive (0/5) 3  Naming (0/3) 2  Attention: Read list of digits (0/2) 2  Attention: Read list of letters (0/1) 1  Attention: Serial 7 subtraction starting at 100 (0/3) 0  Language: Repeat phrase (0/2) 2  Language : Fluency (0/1) 0  Abstraction (0/2) 1  Delayed Recall (0/5) 1  Orientation (0/6) 5  Total 17  Adjusted Score (based on education) 18   MMSE - Mini Mental State Exam 10/19/2021  Orientation to time 5  Orientation to Place 5  Registration 3  Attention/ Calculation 5  Recall 3  Language- name 2 objects 2  Language- repeat 1  Language- follow 3 step command 3  Language- read & follow direction 1  Write a sentence 1  Copy design 1  Total score 30    No  flowsheet data found.   Orientation:  Alert and oriented to person, place, the year is 1923. No aphasia or dysarthria. Fund of knowledge is appropriate. Recent memory impaired and remote memory intact.  Attention and concentration are reduced.  Able to name objects 2/3  and repeat phrases. Delayed recall 1/5  Cranial nerves: There is good facial symmetry. Extraocular muscles are intact and visual fields are full to confrontational testing. Speech is fluent and clear. Soft palate rises symmetrically and there is no tongue deviation. Hearing is intact to conversational tone. Tone: Tone is good throughout. Sensation: Sensation is intact to light touch and pinprick throughout except L side of her face. Vibration is intact at the bilateral big toe.There is no extinction with double simultaneous stimulation. There is no sensory dermatomal level identified. Coordination: The patient has no difficulty with RAM's or FNF bilaterally. Normal finger to nose  Motor: Strength is 5/5 in the bilateral upper and lower extremities. There is no pronator drift. There are no  fasciculations noted. DTR's: Deep tendon reflexes are 2/4 at the bilateral biceps, triceps, brachioradialis, patella and achilles.  Plantar responses are downgoing bilaterally. Gait and Station: The patient is able to ambulate without difficulty with a cane.The patient is able to heel toe walk without any difficulty.The patient is able to ambulate in a tandem fashion. The patient is able to stand in the Romberg position.     Thank you for allowing [place] the opportunity to participate in the care of this nice patient. Please do not hesitate to contact us for any questions or concerns.   Total time spent on today's visit was 60 minutes, including both face-to-face time and nonface-to-face time.  Time included that spent on review of records (prior notes available to me/labs/imaging if pertinent), discussing treatment and goals, answering patient's questions and coordinating care.  Cc:  Allwardt, Eugenie Norrie 11/12/2021 7:32 AM

## 2021-11-11 ENCOUNTER — Ambulatory Visit: Payer: Medicare Other | Admitting: Physician Assistant

## 2021-11-11 ENCOUNTER — Other Ambulatory Visit: Payer: Self-pay

## 2021-11-11 ENCOUNTER — Encounter: Payer: Self-pay | Admitting: Physician Assistant

## 2021-11-11 VITALS — BP 137/79 | HR 97 | Ht 60.0 in | Wt 203.0 lb

## 2021-11-11 DIAGNOSIS — F02A0 Dementia in other diseases classified elsewhere, mild, without behavioral disturbance, psychotic disturbance, mood disturbance, and anxiety: Secondary | ICD-10-CM | POA: Diagnosis not present

## 2021-11-11 DIAGNOSIS — R413 Other amnesia: Secondary | ICD-10-CM

## 2021-11-11 DIAGNOSIS — F01518 Vascular dementia, unspecified severity, with other behavioral disturbance: Secondary | ICD-10-CM | POA: Diagnosis not present

## 2021-11-11 DIAGNOSIS — G301 Alzheimer's disease with late onset: Secondary | ICD-10-CM | POA: Diagnosis not present

## 2021-11-11 NOTE — Patient Instructions (Signed)
It was a pleasure to see you today at our office.   Recommendations:  Neurocognitive evaluation at our office Control cardiovascular issues  Follow up in 3-4  months and  after neurocognitive testing Agree with Psychiatry evaluation   RECOMMENDATIONS FOR ALL PATIENTS WITH MEMORY PROBLEMS: 1. Continue to exercise (Recommend 30 minutes of walking everyday, or 3 hours every week) 2. Increase social interactions - continue going to [place] and enjoy social gatherings with friends and family 3. Eat healthy, avoid fried foods and eat more fruits and vegetables 4. Maintain adequate blood pressure, blood sugar, and blood cholesterol level. Reducing the risk of stroke and cardiovascular disease also helps promoting better memory. 5. Avoid stressful situations. Live a simple life and avoid aggravations. Organize your time and prepare for the next day in anticipation. 6. Sleep well, avoid any interruptions of sleep and avoid any distractions in the bedroom that may interfere with adequate sleep quality 7. Avoid sugar, avoid sweets as there is a strong link between excessive sugar intake, diabetes, and cognitive impairment We discussed the Mediterranean diet, which has been shown to help patients reduce the risk of progressive memory disorders and reduces cardiovascular risk. This includes eating fish, eat fruits and green leafy vegetables, nuts like almonds and hazelnuts, walnuts, and also use olive oil. Avoid fast foods and fried foods as much as possible. Avoid sweets and sugar as sugar use has been linked to worsening of memory function.  There is always a concern of gradual progression of memory problems. If this is the case, then we may need to adjust level of care according to patient needs. Support, both to the patient and caregiver, should then be put into place.      You have been referred for a neuropsychological evaluation (i.e., evaluation of memory and thinking abilities). Please bring  someone with you to this appointment if possible, as it is helpful for the doctor to hear from both you and another adult who knows you well. Please bring eyeglasses and hearing aids if you wear them.    The evaluation will take approximately 3 hours and has two parts:   The first part is a clinical interview with the neuropsychologist (Dr. Milbert Coulter or Dr. Roseanne Reno). During the interview, the neuropsychologist will speak with you and the individual you brought to the appointment.    The second part of the evaluation is testing with the doctor's technician Annabelle Harman or Selena Batten). During the testing, the technician will ask you to remember different types of material, solve problems, and answer some questionnaires. Your family member will not be present for this portion of the evaluation.   Please note: We must reserve several hours of the neuropsychologist's time and the psychometrician's time for your evaluation appointment. As such, there is a No-Show fee of $100. If you are unable to attend any of your appointments, please contact our office as soon as possible to reschedule.    FALL PRECAUTIONS: Be cautious when walking. Scan the area for obstacles that may increase the risk of trips and falls. When getting up in the mornings, sit up at the edge of the bed for a few minutes before getting out of bed. Consider elevating the bed at the head end to avoid drop of blood pressure when getting up. Walk always in a well-lit room (use night lights in the walls). Avoid area rugs or power cords from appliances in the middle of the walkways. Use a walker or a cane if necessary and consider physical  therapy for balance exercise. Get your eyesight checked regularly.  FINANCIAL OVERSIGHT: Supervision, especially oversight when making financial decisions or transactions is also recommended.  HOME SAFETY: Consider the safety of the kitchen when operating appliances like stoves, microwave oven, and blender. Consider having  supervision and share cooking responsibilities until no longer able to participate in those. Accidents with firearms and other hazards in the house should be identified and addressed as well.   ABILITY TO BE LEFT ALONE: If patient is unable to contact 911 operator, consider using LifeLine, or when the need is there, arrange for someone to stay with patients. Smoking is a fire hazard, consider supervision or cessation. Risk of wandering should be assessed by caregiver and if detected at any point, supervision and safe proof recommendations should be instituted.  MEDICATION SUPERVISION: Inability to self-administer medication needs to be constantly addressed. Implement a mechanism to ensure safe administration of the medications.   DRIVING: Regarding driving, in patients with progressive memory problems, driving will be impaired. We advise to have someone else do the driving if trouble finding directions or if minor accidents are reported. Independent driving assessment is available to determine safety of driving.   If you are interested in the driving assessment, you can contact the following:  [facility] [employer] [facility] in [place] [phone]  [facility] [phone]  [facility] [phone] or [phone]    Mediterranean Diet A Mediterranean diet refers to food and lifestyle choices that are based on the traditions of countries located on the [employer]. This way of eating has been shown to help prevent certain conditions and improve outcomes for people who have chronic diseases, like kidney disease and heart disease. What are tips for following this plan? Lifestyle  Cook and eat meals together with your family, when possible. Drink enough fluid to keep your urine clear or pale yellow. Be physically active every day. This includes: Aerobic exercise like running or swimming. Leisure activities like gardening,  walking, or housework. Get 7-8 hours of sleep each night. If recommended by your health care provider, drink red wine in moderation. This means 1 glass a day for nonpregnant women and 2 glasses a day for men. A glass of wine equals 5 oz (150 mL). Reading food labels  Check the serving size of packaged foods. For foods such as rice and pasta, the serving size refers to the amount of cooked product, not dry. Check the total fat in packaged foods. Avoid foods that have saturated fat or trans fats. Check the ingredients list for added sugars, such as corn syrup. Shopping  At the grocery store, buy most of your food from the areas near the walls of the store. This includes: Fresh fruits and vegetables (produce). Grains, beans, nuts, and seeds. Some of these may be available in unpackaged forms or large amounts (in bulk). Fresh seafood. Poultry and eggs. Low-fat dairy products. Buy whole ingredients instead of prepackaged foods. Buy fresh fruits and vegetables in-season from local farmers markets. Buy frozen fruits and vegetables in resealable bags. If you do not have access to quality fresh seafood, buy precooked frozen shrimp or canned fish, such as tuna, salmon, or sardines. Buy small amounts of raw or cooked vegetables, salads, or olives from the deli or salad bar at your store. Stock your pantry so you always have certain foods on hand, such as olive oil, canned tuna, canned tomatoes, rice, pasta, and beans. Cooking  Cook foods with extra-virgin olive oil  instead of using butter or other vegetable oils. Have meat as a side dish, and have vegetables or grains as your main dish. This means having meat in small portions or adding small amounts of meat to foods like pasta or stew. Use beans or vegetables instead of meat in common dishes like chili or lasagna. Experiment with different cooking methods. Try roasting or broiling vegetables instead of steaming or sauteing them. Add frozen vegetables  to soups, stews, pasta, or rice. Add nuts or seeds for added healthy fat at each meal. You can add these to yogurt, salads, or vegetable dishes. Marinate fish or vegetables using olive oil, lemon juice, garlic, and fresh herbs. Meal planning  Plan to eat 1 vegetarian meal one day each week. Try to work up to 2 vegetarian meals, if possible. Eat seafood 2 or more times a week. Have healthy snacks readily available, such as: Vegetable sticks with hummus. Greek yogurt. Fruit and nut trail mix. Eat balanced meals throughout the week. This includes: Fruit: 2-3 servings a day Vegetables: 4-5 servings a day Low-fat dairy: 2 servings a day Fish, poultry, or lean meat: 1 serving a day Beans and legumes: 2 or more servings a week Nuts and seeds: 1-2 servings a day Whole grains: 6-8 servings a day Extra-virgin olive oil: 3-4 servings a day Limit red meat and sweets to only a few servings a month What are my food choices? Mediterranean diet Recommended Grains: Whole-grain pasta. Brown rice. Bulgar wheat. Polenta. Couscous. Whole-wheat bread. Orpah Cobbatmeal. Quinoa. Vegetables: Artichokes. Beets. Broccoli. Cabbage. Carrots. Eggplant. Green beans. Chard. Kale. Spinach. Onions. Leeks. Peas. Squash. Tomatoes. Peppers. Radishes. Fruits: Apples. Apricots. Avocado. Berries. Bananas. Cherries. Dates. Figs. Grapes. Lemons. Melon. Oranges. Peaches. Plums. Pomegranate. Meats and other protein foods: Beans. Almonds. Sunflower seeds. Pine nuts. Peanuts. Cod. Salmon. Scallops. Shrimp. Tuna. Tilapia. Clams. Oysters. Eggs. Dairy: Low-fat milk. Cheese. Greek yogurt. Beverages: Water. Red wine. Herbal tea. Fats and oils: Extra virgin olive oil. Avocado oil. Grape seed oil. Sweets and desserts: [place] yogurt with honey. Baked apples. Poached pears. Trail mix. Seasoning and other foods: Basil. Cilantro. Coriander. Cumin. Mint. Parsley. Sage. Rosemary. Tarragon. Garlic. Oregano. Thyme. Pepper. Balsalmic vinegar. Tahini.  Hummus. Tomato sauce. Olives. Mushrooms. Limit these Grains: Prepackaged pasta or rice dishes. Prepackaged cereal with added sugar. Vegetables: Deep fried potatoes (french fries). Fruits: Fruit canned in syrup. Meats and other protein foods: Beef. Pork. Lamb. Poultry with skin. Hot dogs. Tomasa BlaseBacon. Dairy: Ice cream. Sour cream. Whole milk. Beverages: Juice. Sugar-sweetened soft drinks. Beer. Liquor and spirits. Fats and oils: Butter. Canola oil. Vegetable oil. Beef fat (tallow). Lard. Sweets and desserts: Cookies. Cakes. Pies. Candy. Seasoning and other foods: Mayonnaise. Premade sauces and marinades. The items listed may not be a complete list. Talk with your dietitian about what dietary choices are right for you. Summary The Mediterranean diet includes both food and lifestyle choices. Eat a variety of fresh fruits and vegetables, beans, nuts, seeds, and whole grains. Limit the amount of red meat and sweets that you eat. Talk with your health care provider about whether it is safe for you to drink red wine in moderation. This means 1 glass a day for nonpregnant women and 2 glasses a day for men. A glass of wine equals 5 oz (150 mL). This information is not intended to replace advice given to you by your health care provider. Make sure you discuss any questions you have with your health care provider. Document Released: 05/20/2016 Document Revised: 06/22/2016 Document Reviewed: 05/20/2016 Elsevier  Interactive Patient Education  2017 [employer].

## 2021-11-12 ENCOUNTER — Telehealth: Payer: Self-pay | Admitting: Physician Assistant

## 2021-11-12 NOTE — Telephone Encounter (Signed)
Daughter [name] called office wanting to sch an appt with psychiatrist. Patient was called and Neurology's phone number was provided via detailed voice mail [phone]. She is to call that number to sch as she was referred from that office. - lucia

## 2021-11-26 ENCOUNTER — Other Ambulatory Visit: Payer: Self-pay

## 2021-11-26 MED ORDER — SIMVASTATIN 20 MG PO TABS: 20.0000 mg | ORAL_TABLET | Freq: Every day | ORAL | 2 refills | Status: DC

## 2021-12-10 ENCOUNTER — Telehealth: Payer: Self-pay | Admitting: Physician Assistant

## 2021-12-10 NOTE — Telephone Encounter (Signed)
Patient needs a [facility]-  ?ALPRAZolam (XANAX) 0.25 MG tablet ? Pt last DOS 1/9- no further apts sch with Alyssa.  ?

## 2021-12-11 NOTE — Telephone Encounter (Signed)
Left voice message for patient to call clinic.  

## 2021-12-15 NOTE — Telephone Encounter (Signed)
Left detailed message for patient to call the clinic to schedule a appt ?

## 2021-12-16 ENCOUNTER — Other Ambulatory Visit: Payer: Self-pay | Admitting: Family Medicine

## 2021-12-16 ENCOUNTER — Ambulatory Visit: Payer: Medicare Other | Admitting: Physician Assistant

## 2021-12-16 ENCOUNTER — Telehealth: Payer: Self-pay | Admitting: Physician Assistant

## 2021-12-16 DIAGNOSIS — G4700 Insomnia, unspecified: Secondary | ICD-10-CM

## 2021-12-16 NOTE — Telephone Encounter (Signed)
noted 

## 2021-12-16 NOTE — Telephone Encounter (Signed)
Patient stated she has vertigo and cannot come in - pt called 20 minutes past her appointment. Patient states she has vertigo and does not have a ride to office- pt cx 3/7 apt as well. Pt is asking for med refills.- No follow up sch at the moment.  ?

## 2021-12-16 NOTE — Telephone Encounter (Signed)
Patient no longer under Dr. Maralyn Sago care. ?Requested Prescriptions  ?Refused Prescriptions Disp Refills  ?? ALPRAZolam (XANAX) 0.25 MG tablet 90 tablet 5  ?  Sig: Take 0.5-1 tablets (0.125-0.25 mg total) by mouth at bedtime as needed. for sleep  ?  ? There is no refill protocol information for this order  ?  ? ?

## 2021-12-16 NOTE — Telephone Encounter (Signed)
Medication Refill - Medication:  ?ALPRAZolam (XANAX) 0.25 MG tablet ? ?Has the patient contacted their pharmacy? Yes.   ?Contact PCP ? ?Preferred Pharmacy (with phone number or street name):  ?[facility] [place], [place] - [address]  ?[address] [place] [address], [place] [address]  ?Phone:  [phone]  Fax:  [phone]  ? ?Has the patient been seen for an appointment in the last year OR does the patient have an upcoming appointment? Yes.   ? ?Agent: Please be advised that RX refills may take up to 3 business days. We ask that you follow-up with your pharmacy. ?

## 2021-12-18 ENCOUNTER — Encounter: Payer: Self-pay | Admitting: Physician Assistant

## 2021-12-18 ENCOUNTER — Ambulatory Visit (INDEPENDENT_AMBULATORY_CARE_PROVIDER_SITE_OTHER): Payer: Medicare Other | Admitting: Physician Assistant

## 2021-12-18 VITALS — BP 148/82 | HR 94

## 2021-12-18 DIAGNOSIS — R209 Unspecified disturbances of skin sensation: Secondary | ICD-10-CM

## 2021-12-18 DIAGNOSIS — F419 Anxiety disorder, unspecified: Secondary | ICD-10-CM | POA: Diagnosis not present

## 2021-12-18 DIAGNOSIS — I69998 Other sequelae following unspecified cerebrovascular disease: Secondary | ICD-10-CM

## 2021-12-18 DIAGNOSIS — R413 Other amnesia: Secondary | ICD-10-CM | POA: Diagnosis not present

## 2021-12-18 DIAGNOSIS — G4700 Insomnia, unspecified: Secondary | ICD-10-CM

## 2021-12-18 MED ORDER — ALPRAZOLAM 0.25 MG PO TABS: 0.1250 mg | ORAL_TABLET | Freq: Every evening | ORAL | 0 refills | Status: DC | PRN

## 2021-12-18 NOTE — Progress Notes (Signed)
? ?Subjective:  ? ? Patient ID: Amy Ray, female    DOB: 1946-02-27, 76 y.o.   MRN: [id number] ? ?Chief Complaint  ?Patient presents with  ? Anxiety  ?  Requesting refill for Xanax   ? ? ?Anxiety ? ? ? ?Patient is in today for f/up. Here with Daughter, Amy Ray. Wanting to discuss Xanax. She has been taking 0.25 mg one full tablet at bedtime for several years. She states that she is wanting to wean off this medication.  Patient states that she is still not having a full nights rest even with the medication and is usually waking up around 2 or 3 AM.  She has not tried anything else for sleep.  Says that she does have some anxiety at night. ? ? ?Updates from last visit: ?-Recently moved in with her daughter, Amy Ray. (Left her husband of 75 years in September because of stress in her life. Says she feels so much better now.) ?-She had visit with neurology last month and was told MRI showed normal aging.  She states that she was told neurology wants to start her on a medication to help slow memory loss, and wanted her to see psychiatry first. ?-She will be seeing a psychiatrist in May.  ? ? ?Past Medical History:  ?Diagnosis Date  ? History of diverticulitis of colon 05/20/2015  ? per patient report   ? Hyperlipidemia   ? Hypertension   ? Stroke ([place]) 03/16/2005  ? TIA (transient ischemic attack)   ? Vertigo   ? ? ?Past Surgical History:  ?Procedure Laterality Date  ? CHOLECYSTECTOMY  2000  ? HERNIA REPAIR    ? umbilical hernia as a child  ? Pseudophakia OS Left 02/12/2020  ? Dr. Ellin Mayhew  ? REPLACEMENT TOTAL KNEE Bilateral   ? ? ?Family History  ?Problem Relation Age of Onset  ? Stroke Mother   ? Heart attack Mother   ? Hypertension Mother   ? Alzheimer's disease Father   ? Diabetes Father   ? Hypertension Sister   ? Hypertension Sister   ? Hypertension Sister   ? Hypertension Sister   ? Hypertension Sister   ? Breast cancer Sister 68  ? Diabetes Brother   ? Breast cancer Maternal Aunt 61  ? Breast cancer Maternal  Aunt   ? ? ?Social History  ? ?Tobacco Use  ? Smoking status: Never  ? Smokeless tobacco: Never  ?Vaping Use  ? Vaping Use: Never used  ?Substance Use Topics  ? Alcohol use: No  ?  Alcohol/week: 0.0 standard drinks  ? Drug use: No  ?  ? ?Allergies  ?Allergen Reactions  ? Augmentin [Amoxicillin-Pot Clavulanate] Hives  ? Quinapril-Hydrochlorothiazide Other (See Comments)  ?  Other reaction(s): Headache  ? ? ?Review of Systems ?NEGATIVE UNLESS OTHERWISE INDICATED IN HPI ? ? ?   ?Objective:  ?  ? ?BP (!) 148/82   Pulse 94   SpO2 97%  ? ?Wt Readings from Last 3 Encounters:  ?11/11/21 203 lb (92.1 kg)  ?10/19/21 201 lb 4 oz (91.3 kg)  ?07/17/21 211 lb 3.2 oz (95.8 kg)  ? ? ?BP Readings from Last 3 Encounters:  ?12/18/21 (!) 148/82  ?11/11/21 137/79  ?10/19/21 (!) 148/87  ?  ? ?Physical Exam ?Vitals and nursing note reviewed.  ?Constitutional:   ?   Appearance: Normal appearance. She is obese. She is not toxic-appearing.  ?   Comments: Using a walker  ?HENT:  ?   Head: Normocephalic and atraumatic.  ?  Right Ear: External ear normal.  ?   Left Ear: External ear normal.  ?   Nose: Nose normal.  ?   Mouth/Throat:  ?   Mouth: Mucous membranes are moist.  ?Eyes:  ?   Extraocular Movements: Extraocular movements intact.  ?   Conjunctiva/sclera: Conjunctivae normal.  ?   Pupils: Pupils are equal, round, and reactive to light.  ?Cardiovascular:  ?   Rate and Rhythm: Normal rate and regular rhythm.  ?   Pulses: Normal pulses.  ?   Heart sounds: Normal heart sounds.  ?Pulmonary:  ?   Effort: Pulmonary effort is normal.  ?   Breath sounds: Normal breath sounds.  ?Musculoskeletal:     ?   General: Normal range of motion.  ?   Cervical back: Normal range of motion and neck supple.  ?Skin: ?   General: Skin is warm and dry.  ?Neurological:  ?   General: No focal deficit present.  ?   Mental Status: She is alert and oriented to person, place, and time.  ?   Sensory: Sensory deficit (left side of face) present.  ?Psychiatric:     ?    Mood and Affect: Mood normal.     ?   Behavior: Behavior normal.     ?   Thought Content: Thought content normal.     ?   Judgment: Judgment normal.  ? ? ?   ?Assessment & Plan:  ? ?Problem List Items Addressed This Visit   ? ?  ? Other  ? Insomnia  ? Relevant Medications  ? ALPRAZolam (XANAX) 0.25 MG tablet  ? ? ? ?Meds ordered this encounter  ?Medications  ? ALPRAZolam (XANAX) 0.25 MG tablet  ?  Sig: Take 0.5 tablets (0.125 mg total) by mouth at bedtime as needed. for sleep  ?  Dispense:  90 tablet  ?  Refill:  0  ? ? ?1. Insomnia, unspecified type ?2. Anxiety ?-PDMP reviewed today, no red flags, filling appropriately.  ?-Patient was not aware of potential risk that comes with taking Xanax for an extended period of time.  Both patient and daughter agree they would like to wean off this medication. ?- Plan to start taking only 1/2 tablet of the Xanax 0.25 mg at bedtime for the next 90 days. ?-Trial melatonin supplement at bedtime ? ?3. Alterations of sensations, late effect of cerebrovascular disease ?We talked about starting trazodone at bedtime to help with a full nights rest, however she is taking nortriptyline 10 mg to help with her left-sided facial neuralgia.  In order to avoid possible serotonin syndrome, we will hold off on the trazodone at this time.  We can address this issue again at next visit. ? ?4. Memory changes ?She did have a visit with neurology and will follow-up with them after her psychiatry appointment in May. ? ? ?Plan to follow-up with me in the next 3 months ? ? ?This note was prepared with assistance of Systems analyst. Occasional wrong-word or sound-a-like substitutions may have occurred due to the inherent limitations of voice recognition software. ? ? ?Terryann Verbeek M Sarayu Prevost, PA-C ?

## 2021-12-23 ENCOUNTER — Ambulatory Visit: Payer: Medicare Other | Admitting: Physician Assistant

## 2021-12-23 NOTE — Telephone Encounter (Signed)
Noted  

## 2021-12-28 ENCOUNTER — Ambulatory Visit (INDEPENDENT_AMBULATORY_CARE_PROVIDER_SITE_OTHER): Payer: Medicare Other | Admitting: Physician Assistant

## 2021-12-28 ENCOUNTER — Encounter: Payer: Self-pay | Admitting: Physician Assistant

## 2021-12-28 VITALS — BP 122/60 | HR 92 | Temp 98.0°F | Ht 60.0 in | Wt 209.2 lb

## 2021-12-28 DIAGNOSIS — F419 Anxiety disorder, unspecified: Secondary | ICD-10-CM | POA: Diagnosis not present

## 2021-12-28 DIAGNOSIS — I69998 Other sequelae following unspecified cerebrovascular disease: Secondary | ICD-10-CM | POA: Diagnosis not present

## 2021-12-28 DIAGNOSIS — G4700 Insomnia, unspecified: Secondary | ICD-10-CM | POA: Diagnosis not present

## 2021-12-28 DIAGNOSIS — R209 Unspecified disturbances of skin sensation: Secondary | ICD-10-CM | POA: Diagnosis not present

## 2021-12-28 MED ORDER — ALPRAZOLAM 0.25 MG PO TABS: 0.1250 mg | ORAL_TABLET | Freq: Every evening | ORAL | 0 refills | Status: AC | PRN

## 2021-12-28 NOTE — Progress Notes (Signed)
? ?Subjective:  ? ? Patient ID: Amy Ray, female    DOB: [birth year], 76 y.o.   MRN: [id number] ? ?Chief Complaint  ?Patient presents with  ? Medication Management  ? ? ?HPI ?Patient is in today for f/up from 12/18/21: ? ?"Patient is in today for f/up. Here with Daughter, [name]. Wanting to discuss Xanax. She has been taking 0.25 mg one full tablet at bedtime for several years. She states that she is wanting to wean off this medication.  Patient states that she is still not having a full nights rest even with the medication and is usually waking up around 2 or 3 AM.  She has not tried anything else for sleep.  Says that she does have some anxiety at night. ? ?TODAY --- ?-Pt states she has been sleeping well. Trying to wean off Xanax at night. Currently taking 1/2 tab of 0.25 mg nightly for the last 5 nights.  ?-Takes Carbamazepine and Nortriptyline 20 mg - left sided facial neuralgia, works, well doesn't want to stop this medication.  ?-Denies any CP, SOB, dizziness, weakness, seizures, headaches ? ?Past Medical History:  ?Diagnosis Date  ? History of diverticulitis of colon 05/20/2015  ? per patient report   ? Hyperlipidemia   ? Hypertension   ? Stroke ([place]) 03/16/2005  ? TIA (transient ischemic attack)   ? Vertigo   ? ? ?Past Surgical History:  ?Procedure Laterality Date  ? CHOLECYSTECTOMY  2000  ? HERNIA REPAIR    ? umbilical hernia as a child  ? Pseudophakia OS Left 02/12/2020  ? Dr. Ellin Mayhew  ? REPLACEMENT TOTAL KNEE Bilateral   ? ? ?Family History  ?Problem Relation Age of Onset  ? Stroke Mother   ? Heart attack Mother   ? Hypertension Mother   ? Alzheimer's disease Father   ? Diabetes Father   ? Hypertension Sister   ? Hypertension Sister   ? Hypertension Sister   ? Hypertension Sister   ? Hypertension Sister   ? Breast cancer Sister 42  ? Diabetes Brother   ? Breast cancer Maternal Aunt 75  ? Breast cancer Maternal Aunt   ? ? ?Social History  ? ?Tobacco Use  ? Smoking status: Never  ? Smokeless tobacco:  Never  ?Vaping Use  ? Vaping Use: Never used  ?Substance Use Topics  ? Alcohol use: No  ?  Alcohol/week: 0.0 standard drinks  ? Drug use: No  ?  ? ?Allergies  ?Allergen Reactions  ? Augmentin [Amoxicillin-Pot Clavulanate] Hives  ? Quinapril-Hydrochlorothiazide Other (See Comments)  ?  Other reaction(s): Headache  ? ? ?Review of Systems ?NEGATIVE UNLESS OTHERWISE INDICATED IN HPI ? ? ?   ?Objective:  ?  ? ?BP 122/60   Pulse 92   Temp 98 ?F (36.7 ?C)   Ht 5' (1.524 m)   Wt 209 lb 4 oz (94.9 kg)   SpO2 99%   BMI 40.87 kg/m?  ? ?Wt Readings from Last 3 Encounters:  ?12/28/21 209 lb 4 oz (94.9 kg)  ?11/11/21 203 lb (92.1 kg)  ?10/19/21 201 lb 4 oz (91.3 kg)  ? ? ?BP Readings from Last 3 Encounters:  ?12/28/21 122/60  ?12/18/21 (!) 148/82  ?11/11/21 137/79  ?  ? ?Physical Exam ?Vitals and nursing note reviewed.  ?Constitutional:   ?   Appearance: Normal appearance. She is obese. She is not toxic-appearing.  ?   Comments: Using a walker  ?HENT:  ?   Head: Normocephalic and atraumatic.  ?  Right Ear: External ear normal.  ?   Left Ear: External ear normal.  ?   Nose: Nose normal.  ?   Mouth/Throat:  ?   Mouth: Mucous membranes are moist.  ?Eyes:  ?   Extraocular Movements: Extraocular movements intact.  ?   Conjunctiva/sclera: Conjunctivae normal.  ?   Pupils: Pupils are equal, round, and reactive to light.  ?Cardiovascular:  ?   Rate and Rhythm: Normal rate and regular rhythm.  ?   Pulses: Normal pulses.  ?   Heart sounds: Normal heart sounds.  ?Pulmonary:  ?   Effort: Pulmonary effort is normal.  ?   Breath sounds: Normal breath sounds.  ?Musculoskeletal:     ?   General: Normal range of motion.  ?   Cervical back: Normal range of motion and neck supple.  ?Skin: ?   General: Skin is warm and dry.  ?Neurological:  ?   General: No focal deficit present.  ?   Mental Status: She is alert and oriented to person, place, and time.  ?   Sensory: Sensory deficit (left side of face) present.  ?Psychiatric:     ?   Mood and  Affect: Mood normal.     ?   Behavior: Behavior normal.     ?   Thought Content: Thought content normal.     ?   Judgment: Judgment normal.  ? ? ?   ?Assessment & Plan:  ? ?Problem List Items Addressed This Visit   ? ?  ? Nervous and Auditory  ? Alterations of sensations, late effect of cerebrovascular disease  ?  ? Other  ? Insomnia - Primary  ? ?Other Visit Diagnoses   ? ? Anxiety      ? Relevant Medications  ? ALPRAZolam (XANAX) 0.25 MG tablet  ? ?  ? ? ? ?Meds ordered this encounter  ?Medications  ? ALPRAZolam (XANAX) 0.25 MG tablet  ?  Sig: Take 0.5 tablets (0.125 mg total) by mouth at bedtime as needed for up to 15 days for anxiety.  ?  Dispense:  30 tablet  ?  Refill:  0  ? ?Plan: ?Glad to hear that patient seems to be doing better at night and is tolerating weaning from Xanax.  She is eager to stop this medication altogether.  I advised her that because she has been on this medication for several years, a slow wean is definitely best to avoid any unwanted withdrawal effect.  Plan to continue on 0.25 mg 1/2 tablet at night for the next 15 days and then stop medication from there.  Patient and daughter agreeable with this plan and will keep me updated through [place] on how things are going.  She is also going to start melatonin supplement at bedtime.  She will continue on her Tegretol and nortriptyline as she has been doing. ? ?Patient to follow-up with me in 3 months in office or prn. ? ?This note was prepared with assistance of Dragon voice recognition software. Occasional wrong-word or sound-a-like substitutions may have occurred due to the inherent limitations of voice recognition software. ? ?Mykal Batiz M Trentyn Boisclair, PA-C ?

## 2022-01-07 ENCOUNTER — Other Ambulatory Visit: Payer: Self-pay | Admitting: Family Medicine

## 2022-01-20 ENCOUNTER — Other Ambulatory Visit: Payer: Self-pay | Admitting: Orthopedic Surgery

## 2022-01-20 DIAGNOSIS — M19011 Primary osteoarthritis, right shoulder: Secondary | ICD-10-CM

## 2022-01-25 ENCOUNTER — Telehealth: Payer: Self-pay | Admitting: Physician Assistant

## 2022-01-25 NOTE — Telephone Encounter (Signed)
PT stated she received a message in MyChart to make an appointment with her provider. ? ?Could not find record of that.  ? ?Pt did not want to make another appointment and did not leave a message. ? ?Will call back after rereading the MyChart message. ?

## 2022-02-04 ENCOUNTER — Ambulatory Visit
Admission: RE | Admit: 2022-02-04 | Discharge: 2022-02-04 | Disposition: A | Discharge status: Home / Self Care | Payer: Medicare Other | Source: Ambulatory Visit | Attending: Orthopedic Surgery | Admitting: Orthopedic Surgery

## 2022-02-04 DIAGNOSIS — M19011 Primary osteoarthritis, right shoulder: Secondary | ICD-10-CM

## 2022-02-07 NOTE — Progress Notes (Signed)
? ?Assessment/Plan:  ?  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 ?Amy Ray is a very pleasant 76 y.o. year old RH female with  a history of hypertension, hyperlipidemia, insomnia, history of CVA 2006,  trigeminal neuralgia 2005 vertigo, Vit D deficiency, anxiety, depression initially seen on 11/11/21  for evaluation of memory loss. MoCA today is 18/30 initially suspicious for dementia due to mixed vascular and Alzheimer's Disease with behavioral disturbance. MRI brain with moderate to extensive chronic SVD. However, during this visit, she reports that her hallucinations are resolved and she is more alert and conversant than prior. Delayed recall 1/3.  ? ? Recommendations:  ? ?Discussed safety both in and out of the home.  ?Discussed the importance of regular daily schedule with inclusion of crossword puzzles to maintain brain function.  ?Continue to monitor mood by PCP ?Stay active at least 30 minutes at least 3 times a week.  ?Naps should be scheduled and should be no longer than 60 minutes and should not occur after 2 PM.  ?Mediterranean diet is recommended  ?Control cardiovascular risk factors  ?Start Memantine 5 mg po qhs, side effects discussed.    ?Keep appointment with neuropsychology for clarity of diagnosis and trajectory, as well as to rule out other causes of memory loss such as depression, anxiety.  ?Follow up in 6 months. ? ? ?Case discussed with Dr. Delice Lesch who agrees with the plan ? ? ? ? ?Subjective:  ? ? ?Amy Ray is a very pleasant 76 y.o. RH female  seen today in follow up for memory loss. This patient is accompanied in the office by her  daughter who supplements the history.  Previous records as well as any outside records available were reviewed prior to todays visit.  Patient was last seen at our office on 11/11/21 at which time her MoCA was 18/30. She is not on antidementia meds.  ?  ? ?Any changes in memory since last visit? "I moved with my daughter, so I  don't see bugs anymore".  She reports her memory is better, but "I know what I want to say, but doesn't come as fast as before but it gets come to me".  She continues to enjoy crossword puzzles quite frequently, and tries to stay busy during the day. ?Patient lives with daughter since 12/07/21 ?Repeats oneself? Endorsed. "Sometimes I don't recall I said something" ?Disoriented when walking into a room?  Patient denies   ?Leaving objects in unusual places?  Patient denies   ?Ambulates  with difficulty?   Patient uses a walker, alternating with a R cane for stability, safety.  ?Recent falls? Endorsed. 2 months ago I fell hitting the nightable, mechanical, without LOC   ?Any head injuries?  Patient denies   ?History of seizures?   Patient denies   ?Wandering behavior?  Patient denies   ?Patient drives?  Endorsed. Patient drives short distances  ?Any mood changes such irritability agitation?  Patient denies, not major events.  Her daughter reports that her mood is better since moving in with her. ?Any depression?: Occasionally she feels down, "I miss my husband of 50 years"  ?Hallucinations?  Patient and daughter denies.  "I never have hallucinations, I keep seeing the bugs, were real " ?Paranoia?  Patient denies   ?Patient reports that  occasionally she has insomnia "always had happy vivid dreams ", REM behavior or sleepwalking  ?History of sleep apnea?  Patient denies   ?Any hygiene concerns?  Patient denies   ?  Independent of bathing and dressing?  Endorsed  ?Does the patient needs help with medications? Patient denies "she is good with the medicine". ?Who is in charge of the finances?  Patient is in charge ?Any changes in appetite?  Patient denies   ?Patient have trouble swallowing? Patient denies   ?Does the patient cook?  Patient denies   ?Any kitchen accidents such as leaving the stove on? Patient denies   ?Any headaches? Occasionally tension headaches, resolving quickly. ?Any vision problems?  Endorse.  The  patient has an appointment with her eye doctor, apparently, there were complications with her prior cataract surgery on the left, and she reports that at times she sees double in that eye.  No issues with her right eye.   ?Any focal numbness or tingling?  The patient has a history of trigeminal neuralgia, controlled with nortriptyline and carbamazepine ?Chronic back pain Patient denies   ?Unilateral weakness?  Patient denies   ?Any tremors?  Patient endorses.  She has a history of essential tremor on the left hand, no tremors resting.  The right hand is normal.  She denies any issues trying to hold any objects denies dropping them.  No other signs of  Parkinson's ?Any history of anosmia?  Patient denies   ?Any incontinence of urine?  Patient denies but "just in case ", she wears pads at night  ?Any bowel dysfunction she endorses occasional constipation, she takes laxatives as needed. ? ?  ?Initial Visit 01/15/22 The patient is seen in neurologic consultation at the request of [place], Crist Infante, PA-C for the evaluation of memory.  The patient is accompanied by her daughter who supplements the history. ?This is a 76 y.o. year old RH  female who has had memory issues for about 4 or 5 years, when she began to forget "little things ".  Specifically, she began to forget some conversations, and the symptoms became worse over the last month.  2 weeks ago, she began to experience hallucinations, seeing "bugs everywhere in the food, stool, carpet ".  She called an exterminator, who noticed that there were no bugs in the house.  She kept repeating that she threw them in the garbage, some of them were yellow, some black-and-white, and they have wings.  She was convinced that they were crawling, and causing her "little bite marks ".  Because of these, she threw clothes and furniture to the garbage.  She reports that about 2 weeks ago "they disappeared ".  The patient lives alone, but her other daughter who is not present today,  visits her daily.  Her daughter reported some confusion when the patient is entering the room, but she does not repeat the same stories or asked the same questions.  She does leave objects in different places around the house and then cannot remember where they are.  She continues to drive short distances, and denies feeling lost.  Her mood is anxious at times.  Yesterday, she called life alert because her "heart was racing, and it was felt to be due to anxiety.  She took Xanax, and she was okay "-her daughter says.  She denies depression or irritability.  She likes to do crossword puzzles and word finding.  She sleeps "okay ".  She may have some REM behavior during the night, denies sleep walking.  Denies paranoia.  She may be slightly hesitant to shower, needs assistance.  There are no concerns about her dressing.  Her daughter places the medications in a pillbox,  but she may be missing some doses.  Her daughter monitors the finances.  Her appetite is good, denies trouble swallowing.  She cooks and denies leaving the stove or the faucet on.  She ambulates with a walker and a cane.  She had a fall about 6 weeks ago, mechanical, denies any head injuries.  She started to "lean to the right because of arthritis ".  She denies any headaches, double vision, dizziness, she has permanent left side facial paresthesia since 2006 after her stroke.  She denies any new tingling, or unilateral weakness.  She has very mild tremors on the right hand, denies anosmia.  No history of seizures.  She has mild urine incontinence, and uses a pad.  She denies constipation or diarrhea.  Denies sleep apnea, alcohol or tobacco.  Family history remarkable for father with Alzheimer's disease.  She is a retired Production designer, theatre/television/film. ?  ?  ?MRI brain 10/25/2020 ?1. No specific or reversible finding to explain symptoms. ?2. Moderate to extensive chronic small vessel ischemia in the hemispheric white matter. ?  ? Labs 10/2021 Vit B12 588, TSH 1.6   ?CbC MCV 101.4 ( mildly elevated)  ?  ? ?PREVIOUS MEDICATIONS:  ? ?CURRENT MEDICATIONS:  ?Outpatient Encounter Medications as of 02/08/2022  ?Medication Sig  ? Acetaminophen (TYLENOL EX ST ARTHRITIS PAIN PO) T

## 2022-02-08 ENCOUNTER — Ambulatory Visit: Payer: Medicare Other | Admitting: Physician Assistant

## 2022-02-08 ENCOUNTER — Encounter: Payer: Self-pay | Admitting: Physician Assistant

## 2022-02-08 VITALS — BP 143/81 | HR 82 | Ht 60.0 in | Wt 210.0 lb

## 2022-02-08 DIAGNOSIS — G3184 Mild cognitive impairment, so stated: Secondary | ICD-10-CM

## 2022-02-08 MED ORDER — MEMANTINE HCL 5 MG PO TABS: 5.0000 mg | ORAL_TABLET | Freq: Every day | ORAL | 11 refills | Status: DC

## 2022-02-08 NOTE — Patient Instructions (Signed)
It was a pleasure to see you today at our office.  ? ?Recommendations: ? ?Meds: ?Follow up in  6 months ?Start Memantine 5mg  tablets.  Take 1 tablet at bedtime  Side effects include dizziness, headache, diarrhea or constipation.   ? ?RECOMMENDATIONS FOR ALL PATIENTS WITH MEMORY PROBLEMS: ?1. Continue to exercise (Recommend 30 minutes of walking everyday, or 3 hours every week) ?2. Increase social interactions - continue going to [place] and enjoy social gatherings with friends and family ?3. Eat healthy, avoid fried foods and eat more fruits and vegetables ?4. Maintain adequate blood pressure, blood sugar, and blood cholesterol level. Reducing the risk of stroke and cardiovascular disease also helps promoting better memory. ?5. Avoid stressful situations. Live a simple life and avoid aggravations. Organize your time and prepare for the next day in anticipation. ?6. Sleep well, avoid any interruptions of sleep and avoid any distractions in the bedroom that may interfere with adequate sleep quality ?7. Avoid sugar, avoid sweets as there is a strong link between excessive sugar intake, diabetes, and cognitive impairment ?We discussed the Mediterranean diet, which has been shown to help patients reduce the risk of progressive memory disorders and reduces cardiovascular risk. This includes eating fish, eat fruits and green leafy vegetables, nuts like almonds and hazelnuts, walnuts, and also use olive oil. Avoid fast foods and fried foods as much as possible. Avoid sweets and sugar as sugar use has been linked to worsening of memory function. ? ?There is always a concern of gradual progression of memory problems. If this is the case, then we may need to adjust level of care according to patient needs. Support, both to the patient and caregiver, should then be put into place.  ? ?  ? ? ? ? ?FALL PRECAUTIONS: Be cautious when walking. Scan the area for obstacles that may increase the risk of trips and falls. When getting up  in the mornings, sit up at the edge of the bed for a few minutes before getting out of bed. Consider elevating the bed at the head end to avoid drop of blood pressure when getting up. Walk always in a well-lit room (use night lights in the walls). Avoid area rugs or power cords from appliances in the middle of the walkways. Use a walker or a cane if necessary and consider physical therapy for balance exercise. Get your eyesight checked regularly. ? ?FINANCIAL OVERSIGHT: Supervision, especially oversight when making financial decisions or transactions is also recommended. ? ?HOME SAFETY: Consider the safety of the kitchen when operating appliances like stoves, microwave oven, and blender. Consider having supervision and share cooking responsibilities until no longer able to participate in those. Accidents with firearms and other hazards in the house should be identified and addressed as well. ? ? ?ABILITY TO BE LEFT ALONE: If patient is unable to contact 911 operator, consider using [facility], or when the need is there, arrange for someone to stay with patients. Smoking is a fire hazard, consider supervision or cessation. Risk of wandering should be assessed by caregiver and if detected at any point, supervision and safe proof recommendations should be instituted. ? ?MEDICATION SUPERVISION: Inability to self-administer medication needs to be constantly addressed. Implement a mechanism to ensure safe administration of the medications. ? ? ?DRIVING: Regarding driving, in patients with progressive memory problems, driving will be impaired. We advise to have someone else do the driving if trouble finding directions or if minor accidents are reported. Independent driving assessment is available to determine safety of  driving. ? ? ?If you are interested in the driving assessment, you can contact the following: ? ?[facility] [employer] [facility] in [place] [phone] ? ?[facility] [phone] ? ?[facility] [phone] ? ?[facility] [phone] or [phone] ? ?  ? ? ?Mediterranean Diet ?A Mediterranean diet refers to food and lifestyle choices that are based on the traditions of countries located on the [place]. This way of eating has been shown to help prevent certain conditions and improve outcomes for people who have chronic diseases, like kidney disease and heart disease. ?What are tips for following this plan? ?Lifestyle  ?Cook and eat meals together with your family, when possible. ?Drink enough fluid to keep your urine clear or pale yellow. ?Be physically active every day. This includes: ?Aerobic exercise like running or swimming. ?Leisure activities like gardening, walking, or housework. ?Get 7-8 hours of sleep each night. ?If recommended by your health care provider, drink red wine in moderation. This means 1 glass a day for nonpregnant women and 2 glasses a day for men. A glass of wine equals 5 oz (150 mL). ?Reading food labels  ?Check the serving size of packaged foods. For foods such as rice and pasta, the serving size refers to the amount of cooked product, not dry. ?Check the total fat in packaged foods. Avoid foods that have saturated fat or trans fats. ?Check the ingredients list for added sugars, such as corn syrup. ?Shopping  ?At the grocery store, buy most of your food from the areas near the walls of the store. This includes: ?Fresh fruits and vegetables (produce). ?Grains, beans, nuts, and seeds. Some of these may be available in unpackaged forms or large amounts (in bulk). ?Fresh seafood. ?Poultry and eggs. ?Low-fat dairy products. ?Buy whole ingredients instead of prepackaged foods. ?Buy fresh fruits and vegetables in-season from local farmers markets. ?Buy frozen fruits and vegetables in resealable bags. ?If you do not have access to quality fresh seafood, buy precooked frozen shrimp or canned fish, such as tuna, salmon, or sardines. ?Buy small amounts of raw or  cooked vegetables, salads, or olives from the deli or salad bar at your store. ?Stock your pantry so you always have certain foods on hand, such as olive oil, canned tuna, canned tomatoes, rice, pasta, and beans. ?Cooking  ?Cook foods with extra-virgin olive oil instead of using butter or other vegetable oils. ?Have meat as a side dish, and have vegetables or grains as your main dish. This means having meat in small portions or adding small amounts of meat to foods like pasta or stew. ?Use beans or vegetables instead of meat in common dishes like chili or lasagna. ?Experiment with different cooking methods. Try roasting or broiling vegetables instead of steaming or saut?eing them. ?Add frozen vegetables to soups, stews, pasta, or rice. ?Add nuts or seeds for added healthy fat at each meal. You can add these to yogurt, salads, or vegetable dishes. ?Marinate fish or vegetables using olive oil, lemon juice, garlic, and fresh herbs. ?Meal planning  ?Plan to eat 1 vegetarian meal one day each week. Try to work up to 2 vegetarian meals, if possible. ?Eat seafood 2 or more times a week. ?Have healthy snacks readily available, such as: ?Vegetable sticks with hummus. ?[place] yogurt. ?Fruit and nut trail mix. ?Eat balanced meals throughout the week. This includes: ?Fruit: 2-3 servings a day ?Vegetables: 4-5 servings a day ?Low-fat dairy: 2 servings a day ?Fish, poultry, or lean meat: 1 serving a day ?Beans  and legumes: 2 or more servings a week ?Nuts and seeds: 1-2 servings a day ?Whole grains: 6-8 servings a day ?Extra-virgin olive oil: 3-4 servings a day ?Limit red meat and sweets to only a few servings a month ?What are my food choices? ?Mediterranean diet ?Recommended ?Grains: Whole-grain pasta. Brown rice. Bulgar wheat. Polenta. Couscous. Whole-wheat bread. [place] Cobbatmeal. Quinoa. ?Vegetables: Artichokes. Beets. Broccoli. Cabbage. Carrots. Eggplant. Green beans. Chard. Kale. Spinach. Onions. Leeks. Peas. Squash. Tomatoes.  Peppers. Radishes. ?Fruits: Apples. Apricots. Avocado. Berries. Bananas. Cherries. Dates. Figs. Grapes. Lemons. Melon. Oranges. Peaches. Plums. Pomegranate. ?Meats and other protein foods: Beans. Almonds. S

## 2022-02-12 ENCOUNTER — Encounter: Payer: Self-pay | Admitting: Physician Assistant

## 2022-02-16 ENCOUNTER — Ambulatory Visit: Payer: Medicare Other | Admitting: Physician Assistant

## 2022-03-04 IMAGING — CT CT ANGIO HEAD
2 of 11 series · 7 of 33 positions shown · IV contrast (APPLIED)
Comparison: Head CT February 05, 2020.

CLINICAL DATA: Dizziness, nonspecific.

EXAM:
CT ANGIOGRAPHY HEAD AND NECK
TECHNIQUE: Multidetector CT imaging of the head and neck was performed using
the standard protocol during bolus administration of intravenous
contrast. Multiplanar CT image reconstructions and MIPs were
obtained to evaluate the vascular anatomy. Carotid stenosis
measurements (when applicable) are obtained utilizing NASCET
criteria, using the distal internal carotid diameter as the
denominator.
CONTRAST:  75mL OMNIPAQUE IOHEXOL 350 MG/ML SOLN

[Series 509: cta head neck thins · axial · 0.42mm/px · z∈[-294,-79]mm · 5 of 650 slices shown]
[im 109/650  soft-tissue]
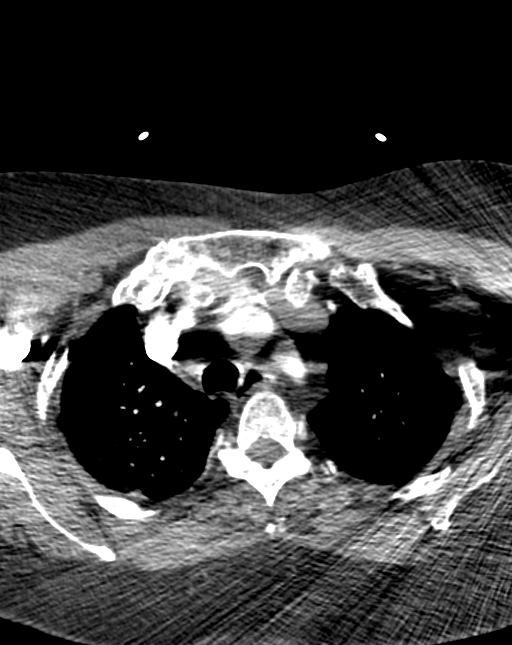
[im 217/650  bone]
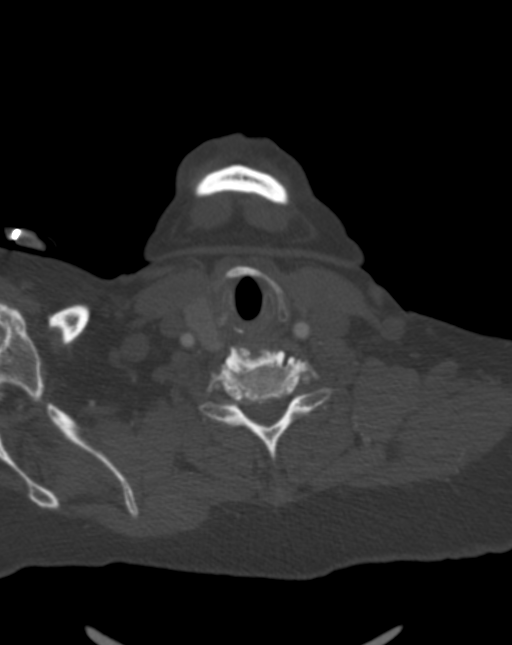
[im 325/650  soft-tissue]
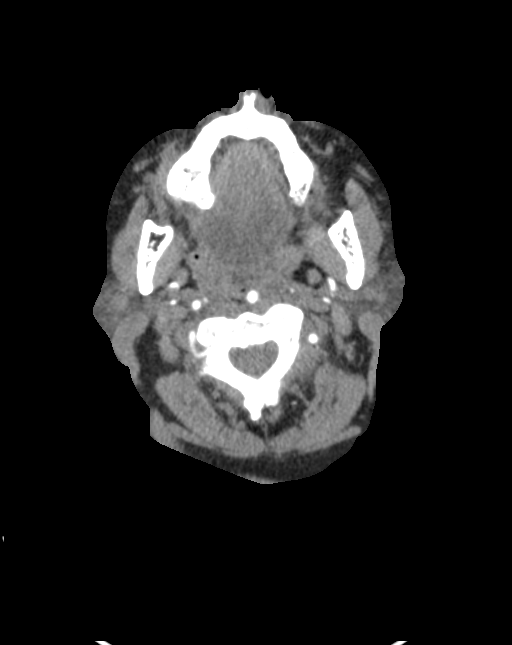
[im 433/650  bone]
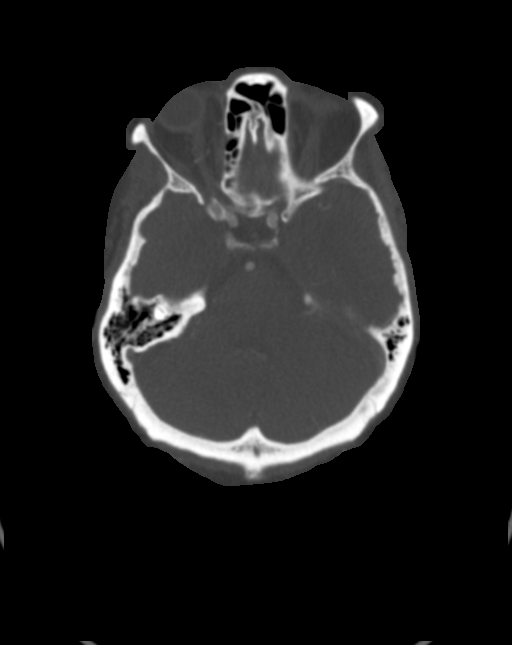
[im 541/650  soft-tissue]
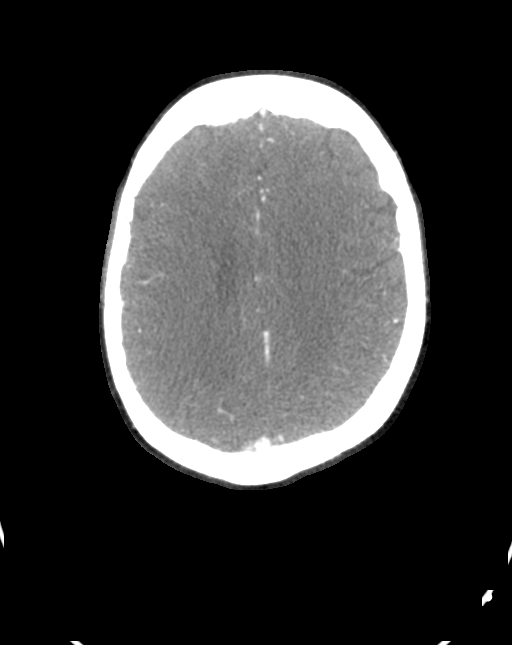

[Series 510: ax thin · axial · 0.42mm/px · z∈[-241,-133]mm · 2 of 325 slices shown]
[im 109/325  soft-tissue]
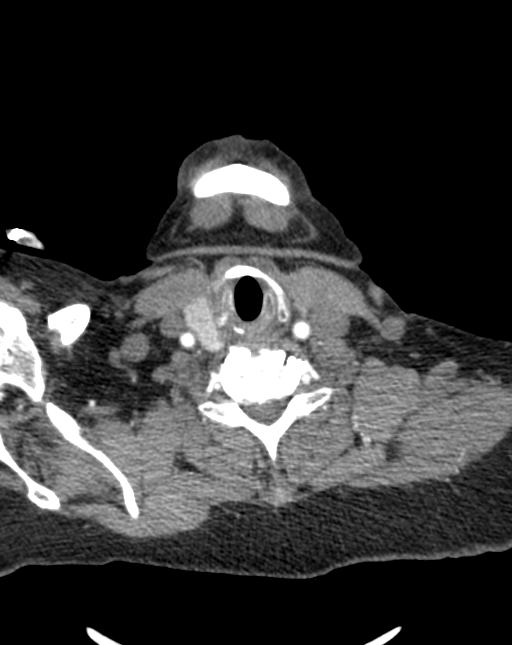
[im 217/325  soft-tissue]
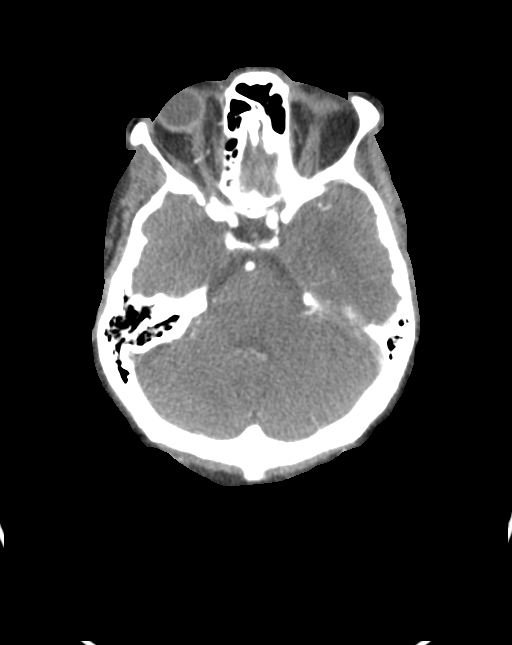

[7 of 33 positions shown; findings below may reference images not displayed]

FINDINGS: CT HEAD FINDINGS

Brain: No evidence of acute infarction, hemorrhage, hydrocephalus,
extra-axial collection or mass lesion/mass effect. Confluent
hypodensity of the white matter of the cerebral hemispheres,
nonspecific, most likely related to chronic microangiopathic
changes.

Vascular: No hyperdense vessel or unexpected calcification.

Skull: Normal. Negative for fracture or focal lesion.

Sinuses: Imaged portions are clear.

Orbits: No acute finding.

Review of the MIP images confirms the above findings

CTA NECK FINDINGS

Aortic arch: Common origin of the innominate and left common carotid
artery from the aortic arch. Imaged portion shows no evidence of
aneurysm or dissection. Calcified atherosclerotic plaques in the
aortic arch. No significant stenosis of the major arch vessel
origins. Increased tortuosity of the major neck arteries may be
related to longstanding hypertension.

Right carotid system: No evidence of dissection, stenosis (50% or
greater) or occlusion.

Left carotid system: No evidence of dissection, stenosis (50% or
greater) or occlusion.

Vertebral arteries: No evidence of dissection, stenosis (50% or
greater) or occlusion.

Skeleton: Degenerative changes of the cervical spine, right
temporomandibular joint and bilateral sternoclavicular joints.

Other neck: Negative.

Upper chest: Negative.

Review of the MIP images confirms the above findings

CTA HEAD FINDINGS

Anterior circulation: No significant stenosis, proximal occlusion,
aneurysm, or vascular malformation.

Posterior circulation: No significant stenosis, proximal occlusion,
aneurysm, or vascular malformation.

Venous sinuses: As permitted by contrast timing, patent.

Anatomic variants: Hypoplastic/aplastic A1/ACA segment with
prominent left A1 segment supplying the bilateral A2 segments.

Review of the MIP images confirms the above findings
IMPRESSION: 1. No acute intracranial abnormality.
2. No large vessel occlusion, hemodynamically significant stenosis,
or evidence of dissection.
3. Aortic atherosclerosis.

Aortic Atherosclerosis ([id number].M).

## 2022-03-08 ENCOUNTER — Other Ambulatory Visit: Payer: Self-pay | Admitting: Family Medicine

## 2022-03-10 ENCOUNTER — Encounter: Payer: Self-pay | Admitting: Physician Assistant

## 2022-03-10 ENCOUNTER — Ambulatory Visit (INDEPENDENT_AMBULATORY_CARE_PROVIDER_SITE_OTHER): Payer: Medicare Other | Admitting: Physician Assistant

## 2022-03-10 VITALS — BP 144/82 | HR 90 | Temp 97.2°F | Ht 60.0 in | Wt 202.2 lb

## 2022-03-10 DIAGNOSIS — I10 Essential (primary) hypertension: Secondary | ICD-10-CM

## 2022-03-10 DIAGNOSIS — Z1211 Encounter for screening for malignant neoplasm of colon: Secondary | ICD-10-CM

## 2022-03-10 DIAGNOSIS — K5909 Other constipation: Secondary | ICD-10-CM

## 2022-03-10 NOTE — Progress Notes (Signed)
Subjective:    Patient ID: Amy Ray, female    DOB: Apr 16, 1946, 76 y.o.   MRN: [id number]  Chief Complaint  Patient presents with   Constipation    Pt c/o constipation and has recently had BM yesterday; pt is now wanting to discuss Cologuard and wanting to order to complete;     Constipation  Patient is in today with her daughter Amy Ray for concerns of constipation. Ongoing years - sometimes hard to have BM Takes Metamucil daily and this keeps her on track Yesterday morning was last BM -normal, brown, soft stool  No abdominal pain, no fevers or chills, appetite still good  No blood in stool.  No black tarry stools.  No pencil thin stools. Patient is also wanting to complete her last Cologuard  Past Medical History:  Diagnosis Date   History of diverticulitis of colon 05/20/2015   per patient report    Hyperlipidemia    Hypertension    Stroke (HCC) 03/16/2005   TIA (transient ischemic attack)    Vertigo     Past Surgical History:  Procedure Laterality Date   CHOLECYSTECTOMY  2000   HERNIA REPAIR     umbilical hernia as a child   Pseudophakia OS Left 02/12/2020   Dr. Clydene Pugh   REPLACEMENT TOTAL KNEE Bilateral     Family History  Problem Relation Age of Onset   Stroke Mother    Heart attack Mother    Hypertension Mother    Alzheimer's disease Father    Diabetes Father    Hypertension Sister    Hypertension Sister    Hypertension Sister    Hypertension Sister    Hypertension Sister    Breast cancer Sister 38   Diabetes Brother    Breast cancer Maternal Aunt 45   Breast cancer Maternal Aunt     Social History   Tobacco Use   Smoking status: Never   Smokeless tobacco: Never  Vaping Use   Vaping Use: Never used  Substance Use Topics   Alcohol use: No    Alcohol/week: 0.0 standard drinks   Drug use: No     Allergies  Allergen Reactions   Augmentin [Amoxicillin-Pot Clavulanate] Hives   Quinapril-Hydrochlorothiazide Other (See Comments)     Other reaction(s): Headache    Review of Systems  Gastrointestinal:  Positive for constipation.  NEGATIVE UNLESS OTHERWISE INDICATED IN HPI      Objective:     BP (!) 144/82   Pulse 90   Temp (!) 97.2 F (36.2 C) (Temporal)   Ht 5' (1.524 m)   Wt 202 lb 3.2 oz (91.7 kg)   SpO2 97%   BMI 39.49 kg/m   Wt Readings from Last 3 Encounters:  03/10/22 202 lb 3.2 oz (91.7 kg)  02/08/22 210 lb (95.3 kg)  12/28/21 209 lb 4 oz (94.9 kg)    BP Readings from Last 3 Encounters:  03/10/22 (!) 144/82  02/08/22 (!) 143/81  12/28/21 122/60     Physical Exam Constitutional:      Appearance: Normal appearance. She is obese.  Eyes:     Pupils: Pupils are equal, round, and reactive to light.  Cardiovascular:     Rate and Rhythm: Normal rate and regular rhythm.     Pulses: Normal pulses.  Pulmonary:     Effort: Pulmonary effort is normal.     Breath sounds: Normal breath sounds.  Abdominal:     General: Abdomen is flat. Bowel sounds are normal.  Palpations: Abdomen is soft. There is no mass.     Tenderness: There is no right CVA tenderness, left CVA tenderness, guarding or rebound.  Neurological:     Mental Status: She is alert.  Psychiatric:        Mood and Affect: Mood normal.        Behavior: Behavior normal.       Assessment & Plan:   Problem List Items Addressed This Visit       Cardiovascular and Mediastinum   Essential (primary) hypertension   Other Visit Diagnoses     Other constipation    -  Primary   Screening for colon cancer       Relevant Orders   Cologuard       PLAN: No red flags noted on exam.  Constipation has resolved since she made appointment initially. She is doing well with Metamucil daily. An After Visit Summary was printed and given to the patient with additional to Constipation relief. Plan to update Cologuard as she is due. Blood pressure elevated, asked for her log at home and call if any concerns.  Keep medications the same  right now.   Return in about 3 months (around 06/10/2022) for recheck meds.  This note was prepared with assistance of Conservation officer, historic buildings. Occasional wrong-word or sound-a-like substitutions may have occurred due to the inherent limitations of voice recognition software.   Shalom Ware M Azure Barrales, PA-C

## 2022-03-10 NOTE — Patient Instructions (Signed)
Great to see you today!  I am glad your constipation has resolved.  Please continue on daily Metamucil.  Stay as active as possible and drink plenty of water throughout the day.  Try to increase leafy greens.  I have placed an order for Cologuard, this should be your last 1 pending it is normal.  Your blood pressure was elevated in office today.  Please try to track your readings 2-3 times a week and bring to next office visit.  Call if any concerns.  Happy to help anytime.

## 2022-03-23 ENCOUNTER — Other Ambulatory Visit: Payer: Self-pay | Admitting: Orthopedic Surgery

## 2022-03-23 DIAGNOSIS — M19011 Primary osteoarthritis, right shoulder: Secondary | ICD-10-CM

## 2022-03-24 ENCOUNTER — Other Ambulatory Visit: Payer: Self-pay | Admitting: General Practice

## 2022-03-24 DIAGNOSIS — M19011 Primary osteoarthritis, right shoulder: Secondary | ICD-10-CM

## 2022-03-25 ENCOUNTER — Emergency Department (HOSPITAL_COMMUNITY)
Admission: EM | Admit: 2022-03-25 | Discharge: 2022-03-26 | Disposition: A | Discharge status: Home / Self Care | Payer: Medicare Other | Attending: Emergency Medicine | Admitting: Emergency Medicine

## 2022-03-25 ENCOUNTER — Encounter (HOSPITAL_COMMUNITY): Payer: Self-pay

## 2022-03-25 DIAGNOSIS — R55 Syncope and collapse: Secondary | ICD-10-CM

## 2022-03-25 DIAGNOSIS — R42 Dizziness and giddiness: Secondary | ICD-10-CM | POA: Diagnosis not present

## 2022-03-25 DIAGNOSIS — Z7982 Long term (current) use of aspirin: Secondary | ICD-10-CM | POA: Insufficient documentation

## 2022-03-25 DIAGNOSIS — I10 Essential (primary) hypertension: Secondary | ICD-10-CM | POA: Insufficient documentation

## 2022-03-25 NOTE — ED Triage Notes (Signed)
Pt comes via [facility] for hypertension that has been running in the 200's today, is not on meds for it at this time, no symptoms.

## 2022-03-26 ENCOUNTER — Telehealth: Payer: Self-pay | Admitting: Physician Assistant

## 2022-03-26 ENCOUNTER — Other Ambulatory Visit: Payer: Medicare Other

## 2022-03-26 LAB — CBC
HCT: 39 % (ref 36.0–46.0)
Hemoglobin: 12.8 g/dL (ref 12.0–15.0)
MCH: 33.8 pg (ref 26.0–34.0)
MCHC: 32.8 g/dL (ref 30.0–36.0)
MCV: 102.9 fL — ABNORMAL HIGH (ref 80.0–100.0)
Platelets: 212 10*3/uL (ref 150–400)
RBC: 3.79 MIL/uL — ABNORMAL LOW (ref 3.87–5.11)
RDW: 11.8 % (ref 11.5–15.5)
WBC: 6.9 10*3/uL (ref 4.0–10.5)
nRBC: 0 % (ref 0.0–0.2)

## 2022-03-26 LAB — URINALYSIS, ROUTINE W REFLEX MICROSCOPIC
Bacteria, UA: NONE SEEN
Bilirubin Urine: NEGATIVE
Glucose, UA: NEGATIVE mg/dL
Ketones, ur: NEGATIVE mg/dL
Nitrite: NEGATIVE
Protein, ur: NEGATIVE mg/dL
Specific Gravity, Urine: 1.008 (ref 1.005–1.030)
pH: 5 (ref 5.0–8.0)

## 2022-03-26 LAB — BASIC METABOLIC PANEL
Anion gap: 11 (ref 5–15)
BUN: 16 mg/dL (ref 8–23)
CO2: 25 mmol/L (ref 22–32)
Calcium: 9.4 mg/dL (ref 8.9–10.3)
Chloride: 100 mmol/L (ref 98–111)
Creatinine, Ser: 0.98 mg/dL (ref 0.44–1.00)
GFR, Estimated: >60 mL/min (ref >60)
Glucose, Bld: 105 mg/dL — ABNORMAL HIGH (ref 70–99)
Potassium: 4.4 mmol/L (ref 3.5–5.1)
Sodium: 136 mmol/L (ref 135–145)

## 2022-03-26 LAB — TROPONIN I (HIGH SENSITIVITY)
Troponin I (High Sensitivity): 5 ng/L (ref <18)
Troponin I (High Sensitivity): 5 ng/L (ref <18)

## 2022-03-26 NOTE — Telephone Encounter (Signed)
Pt states she had nose bleed yesterday and went to ED 03/26/22. Was having dizziness, extreme headache, BP was 200/98, pt states they thing she may have had a stroke. She says currently feeling better today but wanted PCP to be advised.

## 2022-03-26 NOTE — ED Provider Notes (Signed)
[facility] EMERGENCY DEPARTMENT Provider Note   CSN: [id number] Arrival date & time: 03/25/22  2351     History  Chief Complaint  Patient presents with   Hypertension    Amy Ray is a 76 y.o. female.  76 yo F with a chief complaints of an episode.  She tell me that she was at home and she stood up from bed and suddenly felt lightheaded and dizzy.  This was associated with a feeling of heaviness on her head.  She ended up checking her blood pressure and it was quite elevated.  This episode lasted for a couple minutes and then resolved.  She denies any chest pain or trouble breathing.  Denies any continued headache or neck pain.  Denied one-sided numbness or weakness denies difficulty speech or swallowing.   Hypertension       Home Medications Prior to Admission medications   Medication Sig Start Date End Date Taking? Authorizing Provider  Acetaminophen (TYLENOL EX ST ARTHRITIS PAIN PO) Take 2 tablets by mouth daily as needed (for arthritis pain).    [provider]  ALPRAZolam [name]) 0.25 MG tablet Take 0.5 tablets (0.125 mg total) by mouth at bedtime as needed. for sleep 12/18/21   Allwardt, Crist Infante, PA-C  aspirin 81 MG tablet Take 1 tablet by mouth daily.    [provider]  carbamazepine (TEGRETOL) 200 MG tablet TAKE 1 TABLET BY MOUTH  TWICE DAILY 01/07/22   Allwardt, Alyssa M, PA-C  clopidogrel (PLAVIX) 75 MG tablet TAKE 1 TABLET BY MOUTH  DAILY 08/01/21   Malva Limes, MD  diltiazem (DILT-XR) 180 MG 24 hr capsule TAKE 2 CAPSULES BY MOUTH  DAILY 03/08/22   Malva Limes, MD  famotidine (PEPCID) 40 MG tablet TAKE 1 TABLET BY MOUTH AT  BEDTIME 07/15/21   Malva Limes, MD  fluticasone (FLONASE) 50 MCG/ACT nasal spray Place 2 sprays into both nostrils daily. 09/19/20   Malva Limes, MD  furosemide (LASIX) 20 MG tablet TAKE 1 TABLET BY MOUTH  DAILY AS NEEDED FOR EDEMA 07/15/21   Malva Limes, MD  memantine (NAMENDA) 5 MG  tablet Take 1 tablet (5 mg total) by mouth daily. 02/08/22   Marcos Eke, PA-C  nortriptyline (PAMELOR) 10 MG capsule Take 1-2 capsules (10-20 mg total) by mouth at bedtime. 09/10/21   Allwardt, Crist Infante, PA-C  simvastatin (ZOCOR) 20 MG tablet Take 1 tablet (20 mg total) by mouth daily. 11/26/21   Allwardt, Alyssa M, PA-C  VITAMIN D PO Take by mouth.    [provider]      Allergies    Augmentin [amoxicillin-pot clavulanate] and Quinapril-hydrochlorothiazide    Review of Systems   Review of Systems  Physical Exam Updated Vital Signs BP 132/74 (BP Location: Right Arm)   Pulse 86   Temp (!) 97.5 F (36.4 C) (Oral)   Resp 14   SpO2 98%  Physical Exam Vitals and nursing note reviewed.  Constitutional:      General: She is not in acute distress.    Appearance: She is well-developed. She is not diaphoretic.  HENT:     Head: Normocephalic and atraumatic.  Eyes:     Pupils: Pupils are equal, round, and reactive to light.  Cardiovascular:     Rate and Rhythm: Normal rate and regular rhythm.     Heart sounds: No murmur heard.    No friction rub. No gallop.  Pulmonary:     Effort: Pulmonary  effort is normal.     Breath sounds: No wheezing or rales.  Abdominal:     General: There is no distension.     Palpations: Abdomen is soft.     Tenderness: There is no abdominal tenderness.  Musculoskeletal:        General: No tenderness.     Cervical back: Normal range of motion and neck supple.  Skin:    General: Skin is warm and dry.  Neurological:     Mental Status: She is alert and oriented to person, place, and time.     GCS: GCS eye subscore is 4. GCS verbal subscore is 5. GCS motor subscore is 6.     Cranial Nerves: Cranial nerves 2-12 are intact.     Sensory: Sensation is intact.     Motor: Motor function is intact.     Coordination: Coordination is intact.     Gait: Gait is intact.     Comments: Benign neurologic exam  Psychiatric:        Behavior: Behavior normal.      ED Results / Procedures / Treatments   Labs (all labs ordered are listed, but only abnormal results are displayed) Labs Reviewed  CBC - Abnormal; Notable for the following components:      Result Value   RBC 3.79 (*)    MCV 102.9 (*)    All other components within normal limits  BASIC METABOLIC PANEL - Abnormal; Notable for the following components:   Glucose, Bld 105 (*)    All other components within normal limits  URINALYSIS, ROUTINE W REFLEX MICROSCOPIC - Abnormal; Notable for the following components:   Color, Urine STRAW (*)    Hgb urine dipstick SMALL (*)    Leukocytes,Ua TRACE (*)    All other components within normal limits  TROPONIN I (HIGH SENSITIVITY)  TROPONIN I (HIGH SENSITIVITY)    EKG None  Radiology No results found.  Procedures Procedures    Medications Ordered in ED Medications - No data to display  ED Course/ Medical Decision Making/ A&P                           Medical Decision Making Amount and/or Complexity of Data Reviewed Labs: ordered.   76 yo F with a chief complaints of an episode.  The patient states that she stood up quickly and she felt lightheaded and felt like she had a pressure on her head.  This lasted for couple minutes and then resolved.  Could be vasovagal.  Patient with a benign exam now.  Work-up here unremarkable UA negative for infection is independently interpreted by me troponin negative no significant anemia no significant electrolyte abnormality.  Benign neurologic exam.  We will have the patient follow-up with her family doctor.  6:30 AM:  I have discussed the diagnosis/risks/treatment options with the patient.  Evaluation and diagnostic testing in the emergency department does not suggest an emergent condition requiring admission or immediate intervention beyond what has been performed at this time.  They will follow up with  PCP. We also discussed returning to the ED immediately if new or worsening sx occur. We  discussed the sx which are most concerning (e.g., sudden worsening pain, fever, inability to tolerate by mouth) that necessitate immediate return. Medications administered to the patient during their visit and any new prescriptions provided to the patient are listed below.  Medications given during this visit Medications - No data to display  The patient appears reasonably screen and/or stabilized for discharge and I doubt any other medical condition or other [facility] requiring further screening, evaluation, or treatment in the ED at this time prior to discharge.          Final Clinical Impression(s) / ED Diagnoses Final diagnoses:  Near syncope  Primary hypertension    Rx / DC Orders ED Discharge Orders     None         Melene Plan, DO 03/26/22 0630

## 2022-03-26 NOTE — Telephone Encounter (Signed)
Patient states she has been having a nose bleed (stated currently it has stopped). Patient declined Triage but will call back if nose bleed starts again. Patient stated she was at the ED all night. ED AVS states the following:  ED After Visit Summary (Printed 03/26/2022)   Follow-Ups: Call Allwardt, Crist Infante, PA-C (Physician Assistant) on 03/26/2022; discuss your visit, see when they want to see you in the office  Patient requests a call back

## 2022-03-26 NOTE — Discharge Instructions (Signed)
Please follow-up with your family doctor in the office.  Please return for recurrent or persistent symptoms.

## 2022-03-26 NOTE — Telephone Encounter (Signed)
Patient has already been scheduled fo f/u on Monday 03/29/22

## 2022-04-02 LAB — COLOGUARD: COLOGUARD: NEGATIVE

## 2022-04-03 ENCOUNTER — Other Ambulatory Visit: Payer: Self-pay | Admitting: Physician Assistant

## 2022-04-14 ENCOUNTER — Telehealth: Payer: Self-pay

## 2022-04-14 NOTE — Telephone Encounter (Signed)
Received pre-op clearance form. PCP is requesting an appt prior to filling paperwork out. Please schedule patient.

## 2022-04-16 NOTE — Telephone Encounter (Signed)
Patient has been scheduled for 04/20/22 at 9:30am with Alyssa.

## 2022-04-16 NOTE — Telephone Encounter (Signed)
Is there any update on getting this patient scheduled for an appt for pre op clearance?

## 2022-04-19 ENCOUNTER — Ambulatory Visit: Admission: RE | Admit: 2022-04-19 | Payer: Medicare Other | Source: Ambulatory Visit

## 2022-04-19 ENCOUNTER — Ambulatory Visit
Admission: RE | Admit: 2022-04-19 | Discharge: 2022-04-19 | Disposition: A | Discharge status: Home / Self Care | Payer: Medicare Other | Source: Ambulatory Visit | Attending: General Practice | Admitting: General Practice

## 2022-04-19 DIAGNOSIS — M19011 Primary osteoarthritis, right shoulder: Secondary | ICD-10-CM

## 2022-04-20 ENCOUNTER — Encounter: Payer: Self-pay | Admitting: Physician Assistant

## 2022-04-20 ENCOUNTER — Other Ambulatory Visit: Payer: Self-pay

## 2022-04-20 ENCOUNTER — Ambulatory Visit (INDEPENDENT_AMBULATORY_CARE_PROVIDER_SITE_OTHER): Payer: Medicare Other | Admitting: Physician Assistant

## 2022-04-20 VITALS — BP 136/78 | HR 97 | Temp 98.0°F | Ht 60.0 in | Wt 193.4 lb

## 2022-04-20 DIAGNOSIS — Z8673 Personal history of transient ischemic attack (TIA), and cerebral infarction without residual deficits: Secondary | ICD-10-CM | POA: Diagnosis not present

## 2022-04-20 DIAGNOSIS — Z01818 Encounter for other preprocedural examination: Secondary | ICD-10-CM

## 2022-04-20 DIAGNOSIS — I10 Essential (primary) hypertension: Secondary | ICD-10-CM | POA: Diagnosis not present

## 2022-04-20 LAB — POC URINALSYSI DIPSTICK (AUTOMATED)
Bilirubin, UA: POSITIVE
Blood, UA: POSITIVE
Glucose, UA: NEGATIVE
Ketones, UA: POSITIVE
Nitrite, UA: NEGATIVE
Protein, UA: POSITIVE — AB
Spec Grav, UA: 1.015 (ref 1.010–1.025)
Urobilinogen, UA: 0.2 E.U./dL
pH, UA: 6 (ref 5.0–8.0)

## 2022-04-20 NOTE — Patient Instructions (Signed)
Labs & urine today Stop Plavix 5 days prior to surgery  Call if any concerns

## 2022-04-20 NOTE — Progress Notes (Signed)
Subjective:    Patient ID: Amy Ray, female    DOB: May 20, 1946, 75 y.o.   MRN: [id number]  Chief Complaint  Patient presents with   Pre-op Exam    Pt coming in to be seen for Pre op evaluation; pt had recent ER visit; pt c/o dizzy spells at times;     HPI Patient is in today for pre-op evaluation. Here with daughter, [name].  Supposed to be having R shoulder surgery with Dr. Andrey Campanile. Needing some labs and EKG updated.  ED visit 03/25/22 - "near syncope and dizziness" - upon further discussion with patient and daughter, this happened secondary to taking Tramadol, which was prescribed for her shoulder pain. Said she took one 50 mg tablet, made her dizzy, gave a headache and nosebleed, then went to ED. Troponin tests were negative. CBC and BMP unremarkable. Pt has not had any of these symptoms since that day.  Tells me she rides stationary bike at least 30-60 minutes most days; denies any chest pain or shortness of breath with exertion. No prior issues with surgeries / anesthesia.   Past Medical History:  Diagnosis Date   History of diverticulitis of colon 05/20/2015   per patient report    Hyperlipidemia    Hypertension    Stroke (HCC) 03/16/2005   TIA (transient ischemic attack)    Vertigo     Past Surgical History:  Procedure Laterality Date   CHOLECYSTECTOMY  2000   HERNIA REPAIR     umbilical hernia as a child   Pseudophakia OS Left 02/12/2020   Dr. Clydene Pugh   REPLACEMENT TOTAL KNEE Bilateral    SHOULDER SURGERY Left     Family History  Problem Relation Age of Onset   Stroke Mother    Heart attack Mother    Hypertension Mother    Alzheimer's disease Father    Diabetes Father    Hypertension Sister    Hypertension Sister    Hypertension Sister    Hypertension Sister    Hypertension Sister    Breast cancer Sister 61   Diabetes Brother    Breast cancer Maternal Aunt 35   Breast cancer Maternal Aunt     Social History   Tobacco Use   Smoking status:  Never   Smokeless tobacco: Never  Vaping Use   Vaping Use: Never used  Substance Use Topics   Alcohol use: No    Alcohol/week: 0.0 standard drinks of alcohol   Drug use: No     Allergies  Allergen Reactions   Augmentin [Amoxicillin-Pot Clavulanate] Hives   Quinapril-Hydrochlorothiazide Other (See Comments)    Other reaction(s): Headache   Tramadol     Dizzy, headache, nosebleed, near-syncope    Review of Systems NEGATIVE UNLESS OTHERWISE INDICATED IN HPI      Objective:     BP 136/78 (BP Location: Right Arm)   Pulse 97   Temp 98 F (36.7 C) (Temporal)   Ht 5' (1.524 m)   Wt 193 lb 6.4 oz (87.7 kg)   SpO2 98%   BMI 37.77 kg/m   Wt Readings from Last 3 Encounters:  04/20/22 193 lb 6.4 oz (87.7 kg)  03/10/22 202 lb 3.2 oz (91.7 kg)  02/08/22 210 lb (95.3 kg)    BP Readings from Last 3 Encounters:  04/20/22 136/78  03/26/22 132/74  03/10/22 (!) 144/82     Physical Exam Constitutional:      Appearance: Normal appearance. She is obese.  Eyes:     Pupils: Pupils  are equal, round, and reactive to light.  Neck:     Thyroid: No thyroid mass, thyromegaly or thyroid tenderness.  Cardiovascular:     Rate and Rhythm: Normal rate and regular rhythm.     Pulses: Normal pulses.  Pulmonary:     Effort: Pulmonary effort is normal.     Breath sounds: Normal breath sounds.  Abdominal:     General: Abdomen is flat.     Palpations: Abdomen is soft.  Lymphadenopathy:     Cervical: No cervical adenopathy.     Right cervical: No superficial cervical adenopathy.    Left cervical: No superficial cervical adenopathy.  Skin:    Findings: No lesion or rash.  Neurological:     Mental Status: She is alert.  Psychiatric:        Mood and Affect: Mood normal.        Behavior: Behavior normal.        Assessment & Plan:   Problem List Items Addressed This Visit       Cardiovascular and Mediastinum   Essential (primary) hypertension     Other   History of CVA  (cerebrovascular accident)   Other Visit Diagnoses     Pre-op evaluation    -  Primary   Relevant Orders   EKG 12-Lead (Completed)   POCT Urinalysis Dipstick (Automated) (Completed)   CBC with Differential/Platelet   Comprehensive metabolic panel   Hemoglobin A1c   Urine Culture       1. Pre-op evaluation 2. Essential (primary) hypertension 3. History of CVA (cerebrovascular accident) -Patient will need to update blood work today for her upcoming surgery -I personally reviewed her recent ED notes / labs; agree with patient and daughter this seems like isolated incident secondary to tramadol, updated her contraindications list in chart.  -Calculated RCRI score - Points 1: Class II Low (0.9% complications) - based on her hx of CVA -Plan to complete pre-surgical paperwork when her blood work is back  -I personally reviewed her EKG - NSR at 84 bpm, no T wave inversions or changes, no signs of ischemia; she has no complaints of CP or SOB with exercise -She will need to stop Plavix 5 days prior to surgery    This note was prepared with assistance of Dragon voice recognition software. Occasional wrong-word or sound-a-like substitutions may have occurred due to the inherent limitations of voice recognition software.  In addition to time spent for ekg, I spent 30 minutes of total time on the date of the encounter performing the following actions: chart review prior to seeing the patient, obtaining history, performing a medically necessary exam, counseling on the treatment plan, placing orders, and documenting in our EHR.      Justyna Timoney M Jazlen Ogarro, PA-C

## 2022-04-26 ENCOUNTER — Telehealth: Payer: Self-pay

## 2022-04-26 NOTE — Telephone Encounter (Signed)
Called pt to advise we need her to go to [facility] for blood work to complete and sign off on Pre Op Clearance form. Pt advised maybe her daughter would take her Wednesday. Advised her to let us know if there was anything further we could do to help. Pt verbalized understanding, Pre Op clearance for in Upcoming appt paperwork for Alyssa

## 2022-04-27 ENCOUNTER — Telehealth: Payer: Self-pay | Admitting: Physician Assistant

## 2022-04-27 NOTE — Telephone Encounter (Signed)
Patient states our office sent her somewhere to get labs.  States they were not able to draw her labs.  Wants to know where she is to go now.    Please follow up with patient in regard.

## 2022-04-28 ENCOUNTER — Telehealth: Payer: Self-pay | Admitting: Physician Assistant

## 2022-04-28 ENCOUNTER — Other Ambulatory Visit (INDEPENDENT_AMBULATORY_CARE_PROVIDER_SITE_OTHER): Payer: Medicare Other

## 2022-04-28 DIAGNOSIS — Z01818 Encounter for other preprocedural examination: Secondary | ICD-10-CM

## 2022-04-28 LAB — CBC WITH DIFFERENTIAL/PLATELET
Basophils Absolute: 0 10*3/uL (ref 0.0–0.1)
Basophils Relative: 0.4 % (ref 0.0–3.0)
Eosinophils Absolute: 0.1 10*3/uL (ref 0.0–0.7)
Eosinophils Relative: 1 % (ref 0.0–5.0)
HCT: 37.2 % (ref 36.0–46.0)
Hemoglobin: 12.5 g/dL (ref 12.0–15.0)
Lymphocytes Relative: 25.6 % (ref 12.0–46.0)
Lymphs Abs: 1.5 10*3/uL (ref 0.7–4.0)
MCHC: 33.6 g/dL (ref 30.0–36.0)
MCV: 100.9 fl — ABNORMAL HIGH (ref 78.0–100.0)
Monocytes Absolute: 0.6 10*3/uL (ref 0.1–1.0)
Monocytes Relative: 10.2 % (ref 3.0–12.0)
Neutro Abs: 3.6 10*3/uL (ref 1.4–7.7)
Neutrophils Relative %: 62.8 % (ref 43.0–77.0)
Platelets: 199 10*3/uL (ref 150.0–400.0)
RBC: 3.68 Mil/uL — ABNORMAL LOW (ref 3.87–5.11)
RDW: 13 % (ref 11.5–15.5)
WBC: 5.7 10*3/uL (ref 4.0–10.5)

## 2022-04-28 LAB — COMPREHENSIVE METABOLIC PANEL
ALT: 13 U/L (ref 0–35)
AST: 21 U/L (ref 0–37)
Albumin: 4.2 g/dL (ref 3.5–5.2)
Alkaline Phosphatase: 124 U/L — ABNORMAL HIGH (ref 39–117)
BUN: 12 mg/dL (ref 6–23)
CO2: 29 mEq/L (ref 19–32)
Calcium: 9.3 mg/dL (ref 8.4–10.5)
Chloride: 100 mEq/L (ref 96–112)
Creatinine, Ser: 0.95 mg/dL (ref 0.40–1.20)
GFR: 58.82 mL/min — ABNORMAL LOW (ref >60.00)
Glucose, Bld: 97 mg/dL (ref 70–99)
Potassium: 4.2 mEq/L (ref 3.5–5.1)
Sodium: 136 mEq/L (ref 135–145)
Total Bilirubin: 0.5 mg/dL (ref 0.2–1.2)
Total Protein: 7.8 g/dL (ref 6.0–8.3)

## 2022-04-28 LAB — HEMOGLOBIN A1C: Hgb A1c MFr Bld: 5.5 % (ref 4.6–6.5)

## 2022-04-28 NOTE — Telephone Encounter (Signed)
Pt states she received a call from [facility] phone number but did not know the reason for the call.  Please call back as appropriate.

## 2022-04-28 NOTE — Telephone Encounter (Signed)
LVM for pt to return call regarding labs and paperwork needed for Pre op

## 2022-04-28 NOTE — Telephone Encounter (Signed)
Please see previous note.

## 2022-04-29 LAB — URINE CULTURE
MICRO NUMBER:: 13667253
SPECIMEN QUALITY:: ADEQUATE

## 2022-05-18 ENCOUNTER — Other Ambulatory Visit: Payer: Self-pay | Admitting: Physician Assistant

## 2022-05-18 DIAGNOSIS — Z1231 Encounter for screening mammogram for malignant neoplasm of breast: Secondary | ICD-10-CM

## 2022-05-24 ENCOUNTER — Ambulatory Visit: Payer: Medicare Other | Admitting: Physician Assistant

## 2022-06-03 ENCOUNTER — Encounter: Payer: Self-pay | Admitting: Family Medicine

## 2022-06-03 ENCOUNTER — Ambulatory Visit (INDEPENDENT_AMBULATORY_CARE_PROVIDER_SITE_OTHER): Payer: Medicare Other | Admitting: Family Medicine

## 2022-06-03 ENCOUNTER — Telehealth: Payer: Self-pay | Admitting: Physician Assistant

## 2022-06-03 VITALS — BP 130/84 | HR 82 | Temp 99.1°F | Ht 60.0 in | Wt 202.1 lb

## 2022-06-03 DIAGNOSIS — R030 Elevated blood-pressure reading, without diagnosis of hypertension: Secondary | ICD-10-CM | POA: Diagnosis not present

## 2022-06-03 DIAGNOSIS — R350 Frequency of micturition: Secondary | ICD-10-CM

## 2022-06-03 DIAGNOSIS — R42 Dizziness and giddiness: Secondary | ICD-10-CM | POA: Diagnosis not present

## 2022-06-03 LAB — POCT URINALYSIS DIPSTICK
Bilirubin, UA: NEGATIVE
Blood, UA: NEGATIVE
Glucose, UA: NEGATIVE
Ketones, UA: NEGATIVE
Leukocytes, UA: NEGATIVE
Nitrite, UA: NEGATIVE
Protein, UA: NEGATIVE
Spec Grav, UA: <=1.005 — AB (ref 1.010–1.025)
Urobilinogen, UA: 0.2 E.U./dL
pH, UA: 6.5 (ref 5.0–8.0)

## 2022-06-03 LAB — GLUCOSE, POCT (MANUAL RESULT ENTRY): POC Glucose: 88 mg/dl (ref 70–99)

## 2022-06-03 NOTE — Telephone Encounter (Signed)
Patient was scheduled with Ruthine Dose today at 2pm.    Patient called in stating she was advised by team health to be seen in 24 hours.

## 2022-06-03 NOTE — Telephone Encounter (Signed)
FYI

## 2022-06-03 NOTE — Telephone Encounter (Signed)
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 Additional Comment Called in by office. Did not have caller on the line. Translation No Nurse Assessment Nurse: Kizzie Bane, RN, Marylene Land Date/Time Lamount Cohen Time): 06/03/2022 12:54:23 PM Confirm and document reason for call. If symptomatic, describe symptoms. ---Caller states her blood pressure is 189/95 and she feels a little dizzy and she is having frequent urination Does the patient have any new or worsening symptoms? ---Yes Will a triage be completed? ---Yes Related visit to physician within the last 2 weeks? ---No Does the PT have any chronic conditions? (i.e. diabetes, asthma, this includes High risk factors for pregnancy, etc.) ---Yes List chronic conditions. ---hypertension Is this a behavioral health or substance abuse call? ---No Guidelines Guideline Title Affirmed Question Affirmed Notes Nurse Date/Time (Eastern Time) Blood Pressure - High Systolic BP >= 180 OR Diastolic >= [address], RN, Marylene Land 06/03/2022 12:56:01 PM Disp. Time Lamount Cohen Time) Disposition Final User 06/03/2022 12:36:08 PM Attempt made - message left Leanord Hawking 06/03/2022 12:59:15 PM See PCP within 24 Hours Yes Kizzie Bane, RN, Marylene Land PLEASE NOTE: All timestamps contained within this report are represented as  [place] Standard Time. CONFIDENTIALTY NOTICE: This fax transmission is intended only for the addressee. It contains information that is legally privileged, confidential or otherwise protected from use or disclosure. If you are not the intended recipient, you are strictly prohibited from reviewing, disclosing, copying using or disseminating any of this information or taking any action in reliance on or regarding this information. If you have received this fax in error, please notify us immediately by telephone so that we can arrange for its return to [place]. Phone: [phone], Toll-Free: [phone], Fax: [phone] Page: 2 of 2 Call Id: [id number] Final Disposition 06/03/2022 12:59:15 PM See PCP within 24 Hours Yes Kizzie Bane, RN, Rosalyn Charters Disagree/Comply Comply Caller Understands Yes PreDisposition Did not know what to do Care Advice Given Per Guideline SEE PCP WITHIN 24 HOURS: CALL BACK IF: * Weakness or numbness of the face, arm or leg on one side of the body occurs * Difficulty walking, difficulty talking, or severe headache occurs * Chest pain or difficulty breathing occurs * You become worse CARE ADVICE given per High Blood Pressure (Adult) guideline. Referrals REFERRED TO PCP OFFICE

## 2022-06-03 NOTE — Patient Instructions (Signed)
Monitor for now Don't drink caffeine especially at night

## 2022-06-03 NOTE — Progress Notes (Signed)
Subjective:     Patient ID: Amy Ray, female    DOB: Oct 24, 1945, 76 y.o.   MRN: [id number]  Chief Complaint  Patient presents with   Dizziness    Dizziness that started today, slight headache Last b/p was 189/95   Urinary Frequency    Urinary frequency that started last night, went almost 20 times    HPI-here w/dau [name] Dizziness since this am.  Slight ha."Crink in neck".   Bp 189/95 at 11am.   Urinary frequency since last pm-20X.  Large amounts.  No burning.  Drank coffee last pm. Dry mouth.  No abd pain, no back pain.  No f/c.  No n/v/d/c. Sometimes, doesn't make it to bathroom.   Occ stkips furosemide and wonders if took extra yesterday.    Having Cataract surgery 06/08/22  There are no preventive care reminders to display for this patient.  Past Medical History:  Diagnosis Date   History of diverticulitis of colon 05/20/2015   per patient report    Hyperlipidemia    Hypertension    Stroke (HCC) 03/16/2005   TIA (transient ischemic attack)    Trigeminal neuralgia    Vertigo     Past Surgical History:  Procedure Laterality Date   CHOLECYSTECTOMY  2000   HERNIA REPAIR     umbilical hernia as a child   Pseudophakia OS Left 02/12/2020   Dr. Clydene Pugh   REPLACEMENT TOTAL KNEE Bilateral    SHOULDER SURGERY Left     Outpatient Medications Prior to Visit  Medication Sig Dispense Refill   Acetaminophen (TYLENOL EX ST ARTHRITIS PAIN PO) Take 2 tablets by mouth daily as needed (for arthritis pain).     aspirin 81 MG tablet Take 1 tablet by mouth daily.     carbamazepine (TEGRETOL) 200 MG tablet TAKE 1 TABLET BY MOUTH  TWICE DAILY 180 tablet 3   clopidogrel (PLAVIX) 75 MG tablet TAKE 1 TABLET BY MOUTH  DAILY 90 tablet 4   diltiazem (DILACOR XR) 180 MG 24 hr capsule Take 360 mg by mouth daily.     famotidine (PEPCID) 40 MG tablet TAKE 1 TABLET BY MOUTH AT  BEDTIME 90 tablet 3   furosemide (LASIX) 20 MG tablet Take 20 mg by mouth.     nortriptyline (PAMELOR) 10 MG  capsule Take 1-2 capsules (10-20 mg total) by mouth at bedtime. 180 capsule 3   SIMBRINZA 1-0.2 % SUSP Apply to eye.     simvastatin (ZOCOR) 20 MG tablet TAKE 1 TABLET BY MOUTH DAILY 100 tablet 2   VITAMIN D PO Take by mouth.     atropine 1 % ophthalmic solution  (Patient not taking: Reported on 06/03/2022)     brimonidine (ALPHAGAN) 0.2 % ophthalmic solution  (Patient not taking: Reported on 06/03/2022)     dorzolamide (TRUSOPT) 2 % ophthalmic solution  (Patient not taking: Reported on 06/03/2022)     ketorolac (ACULAR) 0.5 % ophthalmic solution Place 1 drop into the right eye 4 (four) times daily. (Patient not taking: Reported on 06/03/2022)     ofloxacin (OCUFLOX) 0.3 % ophthalmic solution  (Patient not taking: Reported on 06/03/2022)     prednisoLONE acetate (PRED FORTE) 1 % ophthalmic suspension SMARTSIG:1 Drop(s) Right Eye (Patient not taking: Reported on 06/03/2022)     ALPRAZolam (XANAX) 0.25 MG tablet Take 0.5 tablets (0.125 mg total) by mouth at bedtime as needed. for sleep (Patient not taking: Reported on 04/20/2022) 90 tablet 0   diltiazem (DILT-XR) 180 MG 24 hr capsule  TAKE 2 CAPSULES BY MOUTH  DAILY 180 capsule 4   fluticasone (FLONASE) 50 MCG/ACT nasal spray Place 2 sprays into both nostrils daily. 42 g 6   furosemide (LASIX) 20 MG tablet TAKE 1 TABLET BY MOUTH  DAILY AS NEEDED FOR EDEMA 90 tablet 3   memantine (NAMENDA) 5 MG tablet Take 1 tablet (5 mg total) by mouth daily. 30 tablet 11   No facility-administered medications prior to visit.    Allergies  Allergen Reactions   Augmentin [Amoxicillin-Pot Clavulanate] Hives   Quinapril-Hydrochlorothiazide Other (See Comments)    Other reaction(s): Headache Other reaction(s): Other, Unknown Other reaction(s): Headache Other reaction(s): Headache Other reaction(s): Headache Other reaction(s): Headache   Tape     Other reaction(s): Not available   Tramadol     Dizzy, headache, nosebleed, near-syncope   Silicone Rash    Other  reaction(s): Unknown   ROS neg/noncontributory except as noted HPI/below      Objective:     BP 130/84   Pulse 82   Temp 99.1 F (37.3 C) (Temporal)   Ht 5' (1.524 m)   Wt 202 lb 2 oz (91.7 kg)   SpO2 98%   BMI 39.47 kg/m  Wt Readings from Last 3 Encounters:  06/03/22 202 lb 2 oz (91.7 kg)  04/20/22 193 lb 6.4 oz (87.7 kg)  03/10/22 202 lb 3.2 oz (91.7 kg)    Physical Exam   Gen: WDWN NAD 138/82 HEENT: NCAT, conjunctiva not injected, sclera nonicteric OP slightly dry NECK:  supple, no thyromegaly, no nodes, no carotid bruits CARDIAC: RRR, S1S2+, no murmur. LUNGS: CTAB. No wheezes ABDOMEN:  BS+, soft, NTND, No HSM, no masses. No cvat EXT:  no edema MSK: walker NEURO: A&O x3.  CN II-XII intact.  PSYCH: normal mood. Good eye contact  Labs reviewed from 04/28/22 Glucometer 88  Results for orders placed or performed in visit on 06/03/22  POCT urinalysis dipstick  Result Value Ref Range   Color, UA YELLOW    Clarity, UA CLEAR    Glucose, UA Negative Negative   Bilirubin, UA NEG    Ketones, UA NEG    Spec Grav, UA <=1.005 (A) 1.010 - 1.025   Blood, UA NEG    pH, UA 6.5 5.0 - 8.0   Protein, UA Negative Negative   Urobilinogen, UA 0.2 0.2 or 1.0 E.U./dL   Nitrite, UA NEG    Leukocytes, UA Negative Negative   Appearance     Odor    POCT glucose (manual entry)  Result Value Ref Range   POC Glucose 88 70 - 99 mg/dl       Assessment & Plan:   Problem List Items Addressed This Visit   None Visit Diagnoses     Urinary frequency    -  Primary   Relevant Orders   POCT urinalysis dipstick   Elevated blood pressure reading       Dizziness          Urinary frequency-may have taken extra lasix by mistake.  Monitor.  If continues, let us know Elevated bp this am.  Ok now.  ? Migraine variant.  Monitor Dizziness-earlier today-did have weather front in background-?from HA,weather, lack of sleep d/t nocturia.  Resolved.  Monitor.   No orders of the defined types  were placed in this encounter.   Angelena Sole, MD

## 2022-06-03 NOTE — Telephone Encounter (Signed)
Patient called stating she is dizzy, bp of 189/95, patient has been urinating all night and cannot stop - patient feeling ill- patient has been triaged. Waiting for notes- .

## 2022-06-08 ENCOUNTER — Encounter: Payer: Medicare Other | Admitting: Psychology

## 2022-06-15 ENCOUNTER — Telehealth: Payer: Self-pay | Admitting: Physician Assistant

## 2022-06-15 ENCOUNTER — Ambulatory Visit
Admission: RE | Admit: 2022-06-15 | Discharge: 2022-06-15 | Disposition: A | Discharge status: Home / Self Care | Payer: Medicare Other | Source: Ambulatory Visit | Attending: Physician Assistant | Admitting: Physician Assistant

## 2022-06-15 DIAGNOSIS — Z1231 Encounter for screening mammogram for malignant neoplasm of breast: Secondary | ICD-10-CM

## 2022-06-15 NOTE — Telephone Encounter (Signed)
Caller states: -looking for a clearance for patient. -pt is scheduled for 06/25/22. -Will refax today 09/023   Call back if not received: [phone]

## 2022-06-15 NOTE — Telephone Encounter (Signed)
Returned call and lvm advising not received any faxes in regards to this patient as of yet, please return call if needed.

## 2022-06-16 ENCOUNTER — Encounter: Payer: Medicare Other | Admitting: Psychology

## 2022-06-17 NOTE — Telephone Encounter (Signed)
Returned call again to [facility] and advised still not received a fax from them, was advised by Trula Ore they received what they needed, nothing further needed.

## 2022-07-16 DIAGNOSIS — M25511 Pain in right shoulder: Secondary | ICD-10-CM | POA: Insufficient documentation

## 2022-07-28 ENCOUNTER — Telehealth: Payer: Self-pay | Admitting: Physician Assistant

## 2022-07-28 NOTE — Telephone Encounter (Signed)
Error

## 2022-07-29 ENCOUNTER — Other Ambulatory Visit: Payer: Self-pay | Admitting: Physician Assistant

## 2022-08-02 ENCOUNTER — Telehealth: Payer: Self-pay | Admitting: Physician Assistant

## 2022-08-02 ENCOUNTER — Encounter: Payer: Self-pay | Admitting: Physician Assistant

## 2022-08-02 ENCOUNTER — Ambulatory Visit (INDEPENDENT_AMBULATORY_CARE_PROVIDER_SITE_OTHER): Payer: Medicare Other | Admitting: Physician Assistant

## 2022-08-02 VITALS — BP 140/70 | HR 100 | Temp 97.7°F | Ht 60.0 in | Wt 199.6 lb

## 2022-08-02 DIAGNOSIS — K5909 Other constipation: Secondary | ICD-10-CM

## 2022-08-02 NOTE — Progress Notes (Signed)
Subjective:    Patient ID: Amy Ray, female    DOB: [birth year], 76 y.o.   MRN: [id number]  Chief Complaint  Patient presents with   Abdominal Pain    Pt c/o abdominal pain but states not having pain any longer, pt states when eating food is feeling stuck in upper abdomen and it comes back up at times; pt says next day been seeing a little blood in under garments; pt states she has diverticulitis and has issues at times; pt says overall digestive issues not right; staying constipated using Metamucil gummies and its helps pt.     HPI Patient is in today for diffuse abdominal pain.  Accompanied by daughter, [name]  Tired of taking suppositories and laxatives, so decided to start on Metamucil Fiber - up to three gummies daily, now having cramping. Says she took them for the last 2 months now. Regular bowel movements with the fiber. She's trying to drink water regularly. Appetite is good. No N/V/D.  Past Medical History:  Diagnosis Date   History of diverticulitis of colon 05/20/2015   per patient report    Hyperlipidemia    Hypertension    Stroke ([place]) 03/16/2005   TIA (transient ischemic attack)    Trigeminal neuralgia    Vertigo     Past Surgical History:  Procedure Laterality Date   CHOLECYSTECTOMY  2000   HERNIA REPAIR     umbilical hernia as a child   Pseudophakia OS Left 02/12/2020   Dr. Ellin Mayhew   REPLACEMENT TOTAL KNEE Bilateral    [place]ody Left     Family History  Problem Relation Age of Onset   Stroke Mother    Heart attack Mother    Hypertension Mother    Alzheimer's disease Father    Diabetes Father    Hypertension Sister    Hypertension Sister    Hypertension Sister    Hypertension Sister    Hypertension Sister    Breast cancer Sister 45   Diabetes Brother    Breast cancer Maternal Aunt 70   Breast cancer Maternal Aunt     Social History   Tobacco Use   Smoking status: Never   Smokeless tobacco: Never  Vaping Use   Vaping Use:  Never used  Substance Use Topics   Alcohol use: No    Alcohol/week: 0.0 standard drinks of alcohol   Drug use: No     Allergies  Allergen Reactions   Augmentin [Amoxicillin-Pot Clavulanate] Hives   Quinapril-Hydrochlorothiazide Other (See Comments)    Other reaction(s): Headache Other reaction(s): Other, Unknown Other reaction(s): Headache Other reaction(s): Headache Other reaction(s): Headache Other reaction(s): Headache   Tape     Other reaction(s): Not available   Tramadol     Dizzy, headache, nosebleed, near-syncope   Silicone Rash    Other reaction(s): Unknown    Review of Systems NEGATIVE UNLESS OTHERWISE INDICATED IN HPI      Objective:     BP (!) 140/70   Pulse 100   Temp 97.7 F (36.5 C) (Temporal)   Ht 5' (1.524 m)   Wt 199 lb 9.6 oz (90.5 kg)   SpO2 97%   BMI 38.98 kg/m   Wt Readings from Last 3 Encounters:  08/02/22 199 lb 9.6 oz (90.5 kg)  06/03/22 202 lb 2 oz (91.7 kg)  04/20/22 193 lb 6.4 oz (87.7 kg)    BP Readings from Last 3 Encounters:  08/02/22 (!) 140/70  06/03/22 130/84  04/20/22 136/78  Physical Exam Vitals and nursing note reviewed.  Constitutional:      General: She is not in acute distress.    Appearance: Normal appearance. She is not ill-appearing.  HENT:     Head: Normocephalic and atraumatic.  Cardiovascular:     Rate and Rhythm: Normal rate and regular rhythm.     Pulses: Normal pulses.     Heart sounds: Normal heart sounds.  Pulmonary:     Effort: Pulmonary effort is normal.     Breath sounds: Normal breath sounds.  Abdominal:     General: Abdomen is flat. Bowel sounds are normal.     Palpations: Abdomen is soft.     Tenderness: There is no abdominal tenderness. There is no right CVA tenderness, left CVA tenderness, guarding or rebound.  Skin:    General: Skin is warm and dry.     Findings: No rash.  Neurological:     General: No focal deficit present.     Mental Status: She is alert.  Psychiatric:         Mood and Affect: Mood normal.        Assessment & Plan:  Other constipation  Patient has had regular issues with constipation.  Fiber Gummies were really helping, but it sounds like she was taking too many and it was causing abdominal cramping.  I advised that she may resume the fiber Gummies, but only 1/day to help herself keep regular.  Stool softener, fluids, regular exercise advised as well.  Try to avoid foods that constipate.  Consider GI referral, but she declined at this time.  Red flags discussed.  Patient to let me know if anything changes.     Return in about 4 months (around 12/03/2022) for regular recheck .  This note was prepared with assistance of Conservation officer, historic buildings. Occasional wrong-word or sound-a-like substitutions may have occurred due to the inherent limitations of voice recognition software.    Adelaide Pfefferkorn M Lafreda Casebeer, PA-C

## 2022-08-02 NOTE — Patient Instructions (Addendum)
Good to see you today.  Keep moving - proud of you!  Try only 1 fiber gummy per day to keep things regular, but avoid the cramping. Try to avoid constipating foods (see print-out). Keep hydrated.   Call sooner if any concerns

## 2022-08-02 NOTE — Telephone Encounter (Signed)
Noted. I personally called back the patient, who I just had seen this morning and was in stable condition. She was well oriented and in no signs of distress when I called her back.  She tells me that when she got home from the appointment this morning she noticed a few spots of blood on the toilet paper when she went to wipe after urinating.  She states that every once in a while she has this happen.  Denies any pain.  States that she does have a history of hemorrhoids.  Feels well and has no bleeding otherwise. Advised patient to keep her stool soft, try the fiber Gummies only once per day as we had discussed, keep me posted how she is doing.  If anything changes she may need to come back in for recheck or see GI.  Red flag symptoms discussed with patient.

## 2022-08-02 NOTE — Telephone Encounter (Signed)
Patient had OV on 08/02/22- calling from home stating she is having rectal bleeding.   Patient understands PA/CMA will be calling her regarding this issue.   Patient agrees.

## 2022-08-02 NOTE — Telephone Encounter (Signed)
Please see note and advise  

## 2022-08-03 ENCOUNTER — Encounter (HOSPITAL_COMMUNITY): Payer: Self-pay

## 2022-08-03 ENCOUNTER — Other Ambulatory Visit: Payer: Self-pay

## 2022-08-03 ENCOUNTER — Emergency Department (HOSPITAL_COMMUNITY): Payer: Medicare Other

## 2022-08-03 ENCOUNTER — Emergency Department (HOSPITAL_COMMUNITY)
Admission: EM | Admit: 2022-08-03 | Discharge: 2022-08-03 | Disposition: A | Discharge status: Home / Self Care | Payer: Medicare Other | Attending: Emergency Medicine | Admitting: Emergency Medicine

## 2022-08-03 DIAGNOSIS — K644 Residual hemorrhoidal skin tags: Secondary | ICD-10-CM | POA: Insufficient documentation

## 2022-08-03 DIAGNOSIS — Z7982 Long term (current) use of aspirin: Secondary | ICD-10-CM | POA: Insufficient documentation

## 2022-08-03 DIAGNOSIS — K625 Hemorrhage of anus and rectum: Secondary | ICD-10-CM | POA: Diagnosis present

## 2022-08-03 DIAGNOSIS — Z7902 Long term (current) use of antithrombotics/antiplatelets: Secondary | ICD-10-CM | POA: Diagnosis not present

## 2022-08-03 DIAGNOSIS — K640 First degree hemorrhoids: Secondary | ICD-10-CM

## 2022-08-03 DIAGNOSIS — K5641 Fecal impaction: Secondary | ICD-10-CM | POA: Insufficient documentation

## 2022-08-03 LAB — CBC
HCT: 37 % (ref 36.0–46.0)
Hemoglobin: 12 g/dL (ref 12.0–15.0)
MCH: 33.9 pg (ref 26.0–34.0)
MCHC: 32.4 g/dL (ref 30.0–36.0)
MCV: 104.5 fL — ABNORMAL HIGH (ref 80.0–100.0)
Platelets: 251 10*3/uL (ref 150–400)
RBC: 3.54 MIL/uL — ABNORMAL LOW (ref 3.87–5.11)
RDW: 12.5 % (ref 11.5–15.5)
WBC: 5.6 10*3/uL (ref 4.0–10.5)
nRBC: 0 % (ref 0.0–0.2)

## 2022-08-03 LAB — COMPREHENSIVE METABOLIC PANEL
ALT: 25 U/L (ref 0–44)
AST: 31 U/L (ref 15–41)
Albumin: 3.9 g/dL (ref 3.5–5.0)
Alkaline Phosphatase: 131 U/L — ABNORMAL HIGH (ref 38–126)
Anion gap: 11 (ref 5–15)
BUN: 16 mg/dL (ref 8–23)
CO2: 27 mmol/L (ref 22–32)
Calcium: 9.4 mg/dL (ref 8.9–10.3)
Chloride: 104 mmol/L (ref 98–111)
Creatinine, Ser: 0.93 mg/dL (ref 0.44–1.00)
GFR, Estimated: >60 mL/min (ref >60)
Glucose, Bld: 121 mg/dL — ABNORMAL HIGH (ref 70–99)
Potassium: 3.7 mmol/L (ref 3.5–5.1)
Sodium: 142 mmol/L (ref 135–145)
Total Bilirubin: 0.4 mg/dL (ref 0.3–1.2)
Total Protein: 8.1 g/dL (ref 6.5–8.1)

## 2022-08-03 LAB — POC OCCULT BLOOD, ED: Fecal Occult Bld: POSITIVE — AB

## 2022-08-03 LAB — TYPE AND SCREEN
ABO/RH(D): A POS
Antibody Screen: NEGATIVE

## 2022-08-03 MED ORDER — LIDOCAINE HCL URETHRAL/MUCOSAL 2 % EX GEL
1.0000 | Freq: Once | CUTANEOUS | Status: AC
  Administered 2022-08-03: 1 via TOPICAL
  Filled 2022-08-03: qty 11

## 2022-08-03 MED ORDER — FLEET ENEMA 7-19 GM/118ML RE ENEM
1.0000 | ENEMA | Freq: Once | RECTAL | Status: AC
  Administered 2022-08-03: 1 via RECTAL
  Filled 2022-08-03: qty 1

## 2022-08-03 MED ORDER — PANTOPRAZOLE SODIUM 40 MG IV SOLR
40.0000 mg | Freq: Once | INTRAVENOUS | Status: AC
  Administered 2022-08-03: 40 mg via INTRAVENOUS
  Filled 2022-08-03: qty 10

## 2022-08-03 MED ORDER — IOHEXOL 300 MG/ML  SOLN
100.0000 mL | Freq: Once | INTRAMUSCULAR | Status: AC | PRN
  Administered 2022-08-03: 100 mL via INTRAVENOUS

## 2022-08-03 MED ORDER — HYDROCORTISONE ACETATE 25 MG RE SUPP: 25.0000 mg | Freq: Two times a day (BID) | RECTAL | 0 refills | Status: DC | PRN

## 2022-08-03 MED ORDER — POLYETHYLENE GLYCOL 3350 17 G PO PACK: 17.0000 g | PACK | Freq: Two times a day (BID) | ORAL | 0 refills | Status: DC

## 2022-08-03 MED ORDER — SODIUM CHLORIDE 0.9 % IV BOLUS
1000.0000 mL | Freq: Once | INTRAVENOUS | Status: AC
  Administered 2022-08-03: 1000 mL via INTRAVENOUS

## 2022-08-03 MED ORDER — FAMOTIDINE IN NACL 20-0.9 MG/50ML-% IV SOLN
20.0000 mg | Freq: Once | INTRAVENOUS | Status: AC
  Administered 2022-08-03: 20 mg via INTRAVENOUS
  Filled 2022-08-03: qty 50

## 2022-08-03 NOTE — Discharge Instructions (Addendum)
You have severe constipation from fecal impaction.  I recommend that you stay hydrated increase fiber intake and take MiraLAX twice daily for a week to help you with constipation  You also have hemorrhoids and I recommend Anusol suppository.  You are expected to still have some bleeding from the hemorrhoids.   Please see your doctor this week  Return to ER if you have worse abdominal pain or distention or constipation or severe pain

## 2022-08-03 NOTE — ED Provider Triage Note (Signed)
Emergency Medicine Provider Triage Evaluation Note  Amy Ray , a 76 y.o. female  was evaluated in triage.  Pt complains of food bolus sensation, rectal bleeding, constipation.  Patient reports the symptoms of been ongoing for the last 2 to 3 weeks.  The patient was seen yesterday by her PCP, advised to decrease use of Metamucil Gummies.  PCP believed that this was causing abdominal cramping.  On chart review, there is no mention of rectal bleeding through PCP note however patient endorses that she did advise her PCP she was having rectal bleeding.  Patient denies any abdominal pain however does report history of diverticulosis/diverticulitis.  The patient denies any fevers, nausea or vomiting.  Patient is endorsing lightheadedness on standing.  Patient also reports history of GERD however states she is not compliant on medication.  Review of Systems  Positive:  Negative:   Physical Exam  BP (!) 142/79 (BP Location: Left Arm)   Pulse 98   Temp 98.8 F (37.1 C) (Oral)   Resp 18   Ht 5' (1.524 m)   Wt 86.2 kg   BMI 37.11 kg/m  Gen:   Awake, no distress   Resp:  Normal effort  MSK:   Moves extremities without difficulty  Other:    Medical Decision Making  Medically screening exam initiated at 3:07 PM.  Appropriate orders placed.  Amy Ray was informed that the remainder of the evaluation will be completed by another provider, this initial triage assessment does not replace that evaluation, and the importance of remaining in the ED until their evaluation is complete.     Amy Cecil, PA-C 08/03/22 [phone]

## 2022-08-03 NOTE — ED Provider Notes (Signed)
[place] DEPT Provider Note   CSN: [id number] Arrival date & time: 08/03/22  1440     History  Chief Complaint  Patient presents with   Rectal Bleeding   Constipation    Demond Sahm is a 76 y.o. female history of previous stroke on Plavix, here presenting with rectal bleeding and constipation.  Patient has been constipated for the last week or so.  She states that she has hard pellets that comes out of her rectum and she needed to give herself some stool softeners.  She states that sometimes she has intermittent blood in her stool.  She also felt that she has poor appetite and that food sometimes gets stuck and eventually does pass through.  She denies any vomiting.  Denies any fevers or chills.  She called her doctor and was sent here for further evaluation.  She had previous colonoscopy in [place] and states that she was never diagnosed with colon cancer or diverticulosis.  The history is provided by the patient.       Home Medications Prior to Admission medications   Medication Sig Start Date End Date Taking? Authorizing Provider  Acetaminophen (TYLENOL EX ST ARTHRITIS PAIN PO) Take 2 tablets by mouth daily as needed (for arthritis pain).    [provider]  aspirin 81 MG tablet Take 1 tablet by mouth daily.    [provider]  atropine 1 % ophthalmic solution     [provider]  brimonidine (ALPHAGAN) 0.2 % ophthalmic solution     [provider]  carbamazepine (TEGRETOL) 200 MG tablet TAKE 1 TABLET BY MOUTH  TWICE DAILY 01/07/22   Allwardt, Alyssa M, PA-C  clopidogrel (PLAVIX) 75 MG tablet TAKE 1 TABLET BY MOUTH  DAILY 08/01/21   Birdie Sons, MD  diltiazem (DILACOR XR) 180 MG 24 hr capsule Take 360 mg by mouth daily.    [provider]  dorzolamide (TRUSOPT) 2 % ophthalmic solution     [provider]  famotidine (PEPCID) 40 MG tablet TAKE 1 TABLET BY MOUTH AT  BEDTIME 07/15/21    Birdie Sons, MD  furosemide (LASIX) 20 MG tablet Take 20 mg by mouth.    [provider]  ketorolac (ACULAR) 0.5 % ophthalmic solution Place 1 drop into the right eye 4 (four) times daily. 05/26/22   [provider]  nortriptyline (PAMELOR) 10 MG capsule TAKE 1 TO 2 CAPSULES BY MOUTH AT BEDTIME 07/29/22   Allwardt, Alyssa M, PA-C  ofloxacin (OCUFLOX) 0.3 % ophthalmic solution     [provider]  prednisoLONE acetate (PRED FORTE) 1 % ophthalmic suspension  05/26/22   [provider]  [place] 1-0.2 % SUSP Apply to eye. 05/27/22   [provider]  simvastatin (ZOCOR) 20 MG tablet TAKE 1 TABLET BY MOUTH DAILY 04/05/22   Allwardt, Alyssa M, PA-C  VITAMIN D PO Take by mouth.    [provider]      Allergies    Augmentin [amoxicillin-pot clavulanate], Quinapril-hydrochlorothiazide, Tape, Tramadol, and Silicone    Review of Systems   Review of Systems  Gastrointestinal:  Positive for blood in stool and constipation.  All other systems reviewed and are negative.   Physical Exam Updated Vital Signs BP (!) 142/79 (BP Location: Left Arm)   Pulse 98   Temp 98.8 F (37.1 C) (Oral)   Resp 18   Ht 5' (1.524 m)   Wt 86.2 kg   BMI 37.11 kg/m  Physical Exam  Vitals and nursing note reviewed.  Constitutional:      Comments: Chronically ill, slightly dehydrated  HENT:     Head: Normocephalic.     Nose: Nose normal.     Mouth/Throat:     Mouth: Mucous membranes are moist.  Eyes:     Extraocular Movements: Extraocular movements intact.     Pupils: Pupils are equal, round, and reactive to light.  Cardiovascular:     Rate and Rhythm: Normal rate and regular rhythm.     Pulses: Normal pulses.     Heart sounds: Normal heart sounds.  Pulmonary:     Effort: Pulmonary effort is normal.     Breath sounds: Normal breath sounds.  Abdominal:     General: Abdomen is flat.     Palpations: Abdomen is soft.     Comments: Mild left lower  quadrant tenderness  Genitourinary:    Comments: Small external hemorrhoid.  Patient does have hard stools in the rectum consistent with mild stool impaction. Musculoskeletal:     Cervical back: Normal range of motion and neck supple.  Skin:    General: Skin is warm.     Capillary Refill: Capillary refill takes less than 2 seconds.  Neurological:     General: No focal deficit present.     Mental Status: She is oriented to person, place, and time.  Psychiatric:        Mood and Affect: Mood normal.        Behavior: Behavior normal.     ED Results / Procedures / Treatments   Labs (all labs ordered are listed, but only abnormal results are displayed) Labs Reviewed  CBC - Abnormal; Notable for the following components:      Result Value   RBC 3.54 (*)    MCV 104.5 (*)    All other components within normal limits  COMPREHENSIVE METABOLIC PANEL  POC OCCULT BLOOD, ED  TYPE AND SCREEN  ABO/RH    EKG None  Radiology No results found.  Procedures Procedures    Medications Ordered in ED Medications  famotidine (PEPCID) IVPB 20 mg premix (has no administration in time range)  sodium chloride 0.9 % bolus 1,000 mL (has no administration in time range)  pantoprazole (PROTONIX) injection 40 mg (has no administration in time range)  sodium phosphate (FLEET) 7-19 GM/118ML enema 1 enema (has no administration in time range)  lidocaine (XYLOCAINE) 2 % jelly 1 Application (has no administration in time range)    ED Course/ Medical Decision Making/ A&P                           Medical Decision Making Rosalynne Pryer is a 76 y.o. female here presenting with stool impaction and poor appetite blood in the rectum.  She has a hemorrhoid that is likely from her stool impaction.  We will get CT to rule out obstruction or ileus.  We will get CBC and CMP as well.  Will give enema and IVF.   7:38 PM I was able to give an enema through a coud cath and patient had a large bowel movement.  I  was able to pull out the catheter afterwards.  CT showed diverticulosis with no diverticulitis.  Patient had several large bowel movements.  I think she likely has bleeding from hemorrhoids from constipation.  She has been having intermittent bleeding for the last 2 weeks and her hemoglobin is stable her vitals are stable.  I do  not think she has active diverticular bleed.  I recommend MiraLAX twice daily and Anusol cream.  We will have her follow-up with her doctor and refer her to GI  Problems Addressed: Grade I hemorrhoids: acute illness or injury Impacted stool in intestine [facility]): acute illness or injury  Amount and/or Complexity of Data Reviewed Labs: ordered. Decision-making details documented in ED Course. Radiology: ordered and independent interpretation performed. Decision-making details documented in ED Course.  Risk OTC drugs. Prescription drug management.    Final Clinical Impression(s) / ED Diagnoses Final diagnoses:  None    Rx / DC Orders ED Discharge Orders     None         Drenda Freeze, MD 08/03/22 1940

## 2022-08-03 NOTE — ED Triage Notes (Addendum)
Patient states that she began having mid upper abdominal pain approx 2 weeks ago and then she began having intermittent bright red rectal bleeding. Patient states "I have to watch what I eat. It seems like it gets stuck in the upper part of my stomach."  Patient added in triage, "sometimes I have bowel movement through where I have had babies."  Patient also reports no BM in 3 days.

## 2022-08-03 NOTE — ED Notes (Signed)
Needs IV for CT  

## 2022-08-05 ENCOUNTER — Ambulatory Visit: Payer: Medicare Other | Admitting: Physician Assistant

## 2022-08-11 ENCOUNTER — Ambulatory Visit: Payer: Medicare Other | Admitting: Physician Assistant

## 2022-08-12 ENCOUNTER — Ambulatory Visit: Payer: Medicare Other | Admitting: Physician Assistant

## 2022-08-29 ENCOUNTER — Emergency Department (HOSPITAL_COMMUNITY): Payer: Medicare Other

## 2022-08-29 ENCOUNTER — Emergency Department (HOSPITAL_COMMUNITY)
Admission: EM | Admit: 2022-08-29 | Discharge: 2022-08-29 | Disposition: A | Discharge status: Home / Self Care | Payer: Medicare Other | Attending: Emergency Medicine | Admitting: Emergency Medicine

## 2022-08-29 ENCOUNTER — Encounter (HOSPITAL_COMMUNITY): Payer: Self-pay

## 2022-08-29 DIAGNOSIS — W19XXXA Unspecified fall, initial encounter: Secondary | ICD-10-CM | POA: Diagnosis not present

## 2022-08-29 DIAGNOSIS — S0990XA Unspecified injury of head, initial encounter: Secondary | ICD-10-CM | POA: Diagnosis present

## 2022-08-29 DIAGNOSIS — S0093XA Contusion of unspecified part of head, initial encounter: Secondary | ICD-10-CM | POA: Diagnosis not present

## 2022-08-29 NOTE — ED Triage Notes (Signed)
Pt states she tripped over a curb about 1 hour ago. Pt states she did hit her head, no loc, patient is on Plavix. She is AxOx4.

## 2022-08-29 NOTE — Progress Notes (Signed)
Orthopedic Tech Progress Note Patient Details:  Amy Ray Jun 01, 1946 [id number]  Patient ID: Amy Ray, female   DOB: 1946-05-30, 76 y.o.   MRN: [id number] Level II; not currently needed. Darleen Crocker 08/29/2022, 4:24 PM

## 2022-08-29 NOTE — Discharge Instructions (Signed)
Your history, exam, work-up today did not show evidence of acute skull fracture or intracranial bleeding after falling on your Plavix.  Given your lack of any preceding symptoms and this being a mechanical fall, we agreed to hold on more extensive lab and imaging aside from imaging her head to look for acute injury.  As you were able to ambulate to the bathroom without difficulty and had improvement in symptoms, we do feel you are safe for discharge home.  Please follow-up with your primary doctor and rest and be careful not to fall again.  If any symptoms change or worsen acutely, please return to the nearest emergency department.

## 2022-08-29 NOTE — ED Provider Notes (Signed)
[facility]   CSN: [id number] Arrival date & time: 08/29/22  1520     History  No chief complaint on file.   Amy Ray is a 76 y.o. female.  The history is provided by the patient and medical records. No language interpreter was used.  Trauma Mechanism of injury: Fall Injury location: head/neck Injury location detail: head Incident location: outdoors Time since incident: 30 minutes Arrived directly from scene: yes   Fall:      Point of impact: head      Entrapped after fall: no      Suspicion of alcohol use: no      Suspicion of drug use: no  EMS/PTA data:      Bystander interventions: none      Ambulatory at scene: yes  Current symptoms:      Pain scale: 2/10      Pain quality: aching      Pain timing: constant      Associated symptoms:            Reports headache.            Denies abdominal pain, back pain, chest pain, nausea, neck pain and vomiting.   Relevant PMH:      Tetanus status: unknown      Home Medications Prior to Admission medications   Medication Sig Start Date End Date Taking? Authorizing Provider  Acetaminophen (TYLENOL EX ST ARTHRITIS PAIN PO) Take 2 tablets by mouth daily as needed (for arthritis pain).    [provider]  aspirin 81 MG tablet Take 1 tablet by mouth daily.    [provider]  atropine 1 % ophthalmic solution     [provider]  brimonidine (ALPHAGAN) 0.2 % ophthalmic solution     [provider]  carbamazepine (TEGRETOL) 200 MG tablet TAKE 1 TABLET BY MOUTH  TWICE DAILY 01/07/22   Allwardt, Alyssa M, PA-C  clopidogrel (PLAVIX) 75 MG tablet TAKE 1 TABLET BY MOUTH  DAILY 08/01/21   Malva LimesFisher, Donald E, MD  diltiazem (DILACOR XR) 180 MG 24 hr capsule Take 360 mg by mouth daily.    [provider]  dorzolamide (TRUSOPT) 2 % ophthalmic solution     [provider]  famotidine (PEPCID) 40 MG tablet TAKE 1 TABLET BY MOUTH  AT  BEDTIME 07/15/21   Malva LimesFisher, Donald E, MD  furosemide (LASIX) 20 MG tablet Take 20 mg by mouth.    [provider]  hydrocortisone (ANUSOL-HC) 25 MG suppository Place 1 suppository (25 mg total) rectally 2 (two) times daily as needed for hemorrhoids or anal itching. For 7 days 08/03/22   Charlynne PanderYao, David Hsienta, MD  ketorolac (ACULAR) 0.5 % ophthalmic solution Place 1 drop into the right eye 4 (four) times daily. 05/26/22   [provider]  nortriptyline (PAMELOR) 10 MG capsule TAKE 1 TO 2 CAPSULES BY MOUTH AT BEDTIME 07/29/22   Allwardt, Alyssa M, PA-C  ofloxacin (OCUFLOX) 0.3 % ophthalmic solution     [provider]  polyethylene glycol (MIRALAX / GLYCOLAX) 17 g packet Take 17 g by mouth 2 (two) times daily. 08/03/22   Charlynne PanderYao, David Hsienta, MD  prednisoLONE acetate (PRED FORTE) 1 % ophthalmic suspension  05/26/22   [provider]  SIMBRINZA 1-0.2 % SUSP Apply to eye. 05/27/22   [provider]  simvastatin (ZOCOR) 20 MG tablet TAKE 1 TABLET BY MOUTH DAILY 04/05/22   Allwardt, Crist InfanteAlyssa M, PA-C  VITAMIN  D PO Take by mouth.    [provider]      Allergies    Augmentin [amoxicillin-pot clavulanate], Quinapril-hydrochlorothiazide, Tape, Tramadol, and Silicone    Review of Systems   Review of Systems  Constitutional:  Negative for chills, fatigue and fever.  HENT:  Negative for congestion.   Respiratory:  Negative for cough, chest tightness, shortness of breath and wheezing.   Cardiovascular:  Negative for chest pain.  Gastrointestinal:  Negative for abdominal pain, constipation, diarrhea, nausea and vomiting.  Genitourinary:  Negative for dysuria and flank pain.  Musculoskeletal:  Negative for back pain, neck pain and neck stiffness.  Skin:  Negative for rash and wound.  Neurological:  Positive for headaches. Negative for dizziness, facial asymmetry, weakness, light-headedness and numbness.  Psychiatric/Behavioral:  Negative for agitation and  confusion.   All other systems reviewed and are negative.   Physical Exam Updated Vital Signs BP (!) 159/77   Pulse 80   Temp (!) 97.4 F (36.3 C) (Oral)   Resp 14   Ht 5' (1.524 m)   Wt 87.1 kg   SpO2 94%   BMI 37.50 kg/m  Physical Exam Vitals and nursing note reviewed.  Constitutional:      General: She is not in acute distress.    Appearance: She is well-developed. She is not ill-appearing, toxic-appearing or diaphoretic.  HENT:     Head: Contusion present. No laceration.      Nose: Nose normal.     Mouth/Throat:     Mouth: Mucous membranes are moist.  Eyes:     Extraocular Movements: Extraocular movements intact.     Conjunctiva/sclera: Conjunctivae normal.  Cardiovascular:     Rate and Rhythm: Normal rate and regular rhythm.     Heart sounds: No murmur heard. Pulmonary:     Effort: Pulmonary effort is normal. No respiratory distress.     Breath sounds: Normal breath sounds. No wheezing, rhonchi or rales.  Chest:     Chest wall: No tenderness.  Abdominal:     General: Abdomen is flat.     Palpations: Abdomen is soft.     Tenderness: There is no abdominal tenderness. There is no right CVA tenderness, left CVA tenderness, guarding or rebound.  Musculoskeletal:        General: No swelling or tenderness.     Cervical back: Neck supple.     Right lower leg: No edema.     Left lower leg: No edema.  Skin:    General: Skin is warm and dry.     Capillary Refill: Capillary refill takes less than 2 seconds.     Findings: No erythema or rash.  Neurological:     General: No focal deficit present.     Mental Status: She is alert.     Cranial Nerves: No dysarthria or facial asymmetry.     Sensory: No sensory deficit.     Motor: No weakness or abnormal muscle tone.     Coordination: Coordination normal. Finger-Nose-Finger Test normal.  Psychiatric:        Mood and Affect: Mood normal.     ED Results / Procedures / Treatments   Labs (all labs ordered are listed,  but only abnormal results are displayed) Labs Reviewed - No data to display  EKG None  Radiology CT Head Wo Contrast  Result Date: 08/29/2022 CLINICAL DATA:  Ataxia, head trauma EXAM: CT HEAD WITHOUT CONTRAST TECHNIQUE: Contiguous axial images were obtained from the base of the skull through  the vertex without intravenous contrast. RADIATION DOSE REDUCTION: This exam was performed according to the departmental dose-optimization program which includes automated exposure control, adjustment of the mA and/or kV according to patient size and/or use of iterative reconstruction technique. COMPARISON:  May 25, 2021 FINDINGS: Brain: No evidence of acute infarction, hemorrhage, hydrocephalus, extra-axial collection or mass lesion/mass effect. Periventricular white matter hypodensities consistent with sequela of chronic microvascular ischemic disease. Global parenchymal volume loss. Vascular: Vascular calcifications. Skull: Normal. Negative for fracture or focal lesion. Sinuses/Orbits: No acute finding. Other: None. IMPRESSION: No acute intracranial abnormality. Electronically Signed   By: Meda Klinefelter M.D.   On: 08/29/2022 16:19    Procedures Procedures    Medications Ordered in ED Medications - No data to display  ED Course/ Medical Decision Making/ A&P                           Medical Decision Making Amount and/or Complexity of Data Reviewed Radiology: ordered.    Fanny Agan is a 76 y.o. female with a past medical history significant for previous stroke on Plavix and aspirin, hypertension, hyperlipidemia, vertigo, and previous diverticulitis who presents with a mechanical fall.  Patient reports that she was sitting on the curb and when she stood up and tried to walk her shoes got tangled up and she fell backwards hitting the back of her head on the ground.  She did not lose consciousness and is only complaining of some very mild discomfort in her occiput and denies any neck pain  whatsoever.  She denies any vision changes, nausea, vomiting, or any pain in her torso.  She denies arm or leg injury or pain.  Denies any lacerations.  She reports the pain is a 1 or 2 out of 10 in severity and is not severe.  She denies any preceding symptoms whatsoever with no palpitations, chest pain, shortness of breath, nausea, vomiting, constipation, diarrhea, or urinary changes.  She was feeling at her baseline before the fall and is feeling normal now aside from a mild ache in her head.  Due to the Plavix use, patient was brought in as a level 2 trauma for fall on thinners.  On my exam, lungs are clear and chest is nontender.  Abdomen is nontender.  Patient has no focal neurologic deficits with intact sensation, strength, and pulses in extremities.  Symmetric smile.  Clear speech.  Pupils are symmetric and reactive with normal extract movements.  Neck is nontender just normal range of motion.  She has some mild tenderness on her occiput but there is no laceration seen.  Mild contusion suspected.  Rest of exam unremarkable.  We do shared system and conversation offering more extensive trauma work-up however given her lack of any preceding symptoms and lack of other complaints, we will instead focus on a noncontrast head CT.  If this is reassuring, anticipate patient is likely stable for discharge home with PCP follow-up.   5:04 PM CT head returned without evidence of acute fracture or bleed.  Patient ambulated to the bathroom without difficulty.  Patient reports she is feeling much better and would like to go home.  Given reassuring CT and lack of any preceding symptoms, we do feel she is safe for discharge home.  Patient agrees.  She will follow-up with PCP and understood return precautions.  Patient discharged in good condition.         Final Clinical Impression(s) / ED Diagnoses Final diagnoses:  Fall, initial encounter  Minor head injury, initial encounter    Rx / DC Orders ED  Discharge Orders     None      Clinical Impression: 1. Fall, initial encounter   2. Minor head injury, initial encounter     Disposition: Discharge  Condition: Good  I have discussed the results, Dx and Tx plan with the pt(& family if present). He/she/they expressed understanding and agree(s) with the plan. Discharge instructions discussed at great length. Strict return precautions discussed and pt &/or family have verbalized understanding of the instructions. No further questions at time of discharge.    New Prescriptions   No medications on file    Follow Up: Allwardt, Crist Infante, PA-C [address] [place] [address] [phone]     [facility] EMERGENCY DEPARTMENT [address] [id number] mc [place] [address] [phone]       Yamira Papa, Canary Brim, MD 08/29/22 [phone]

## 2022-08-29 NOTE — ED Notes (Signed)
Pt ambulated to the bathroom.  

## 2022-09-06 ENCOUNTER — Other Ambulatory Visit: Payer: Self-pay

## 2022-09-06 DIAGNOSIS — Z8673 Personal history of transient ischemic attack (TIA), and cerebral infarction without residual deficits: Secondary | ICD-10-CM

## 2022-09-06 MED ORDER — CLOPIDOGREL BISULFATE 75 MG PO TABS: 75.0000 mg | ORAL_TABLET | Freq: Every day | ORAL | 3 refills | Status: DC

## 2022-10-01 ENCOUNTER — Emergency Department (HOSPITAL_COMMUNITY)
Admission: EM | Admit: 2022-10-01 | Discharge: 2022-10-01 | Disposition: A | Discharge status: Home / Self Care | Payer: Medicare Other | Attending: Emergency Medicine | Admitting: Emergency Medicine

## 2022-10-01 ENCOUNTER — Encounter (HOSPITAL_COMMUNITY): Payer: Self-pay

## 2022-10-01 DIAGNOSIS — Z7902 Long term (current) use of antithrombotics/antiplatelets: Secondary | ICD-10-CM | POA: Insufficient documentation

## 2022-10-01 DIAGNOSIS — Z7982 Long term (current) use of aspirin: Secondary | ICD-10-CM | POA: Insufficient documentation

## 2022-10-01 DIAGNOSIS — K625 Hemorrhage of anus and rectum: Secondary | ICD-10-CM | POA: Diagnosis present

## 2022-10-01 LAB — BASIC METABOLIC PANEL
Anion gap: 8 (ref 5–15)
BUN: 12 mg/dL (ref 8–23)
CO2: 27 mmol/L (ref 22–32)
Calcium: 8.9 mg/dL (ref 8.9–10.3)
Chloride: 105 mmol/L (ref 98–111)
Creatinine, Ser: 0.84 mg/dL (ref 0.44–1.00)
GFR, Estimated: >60 mL/min (ref >60)
Glucose, Bld: 83 mg/dL (ref 70–99)
Potassium: 3.5 mmol/L (ref 3.5–5.1)
Sodium: 140 mmol/L (ref 135–145)

## 2022-10-01 LAB — CBC WITH DIFFERENTIAL/PLATELET
Abs Immature Granulocytes: 0.01 10*3/uL (ref 0.00–0.07)
Basophils Absolute: 0 10*3/uL (ref 0.0–0.1)
Basophils Relative: 0 %
Eosinophils Absolute: 0.1 10*3/uL (ref 0.0–0.5)
Eosinophils Relative: 2 %
HCT: 36.4 % (ref 36.0–46.0)
Hemoglobin: 11.9 g/dL — ABNORMAL LOW (ref 12.0–15.0)
Immature Granulocytes: 0 %
Lymphocytes Relative: 30 %
Lymphs Abs: 2 10*3/uL (ref 0.7–4.0)
MCH: 33 pg (ref 26.0–34.0)
MCHC: 32.7 g/dL (ref 30.0–36.0)
MCV: 100.8 fL — ABNORMAL HIGH (ref 80.0–100.0)
Monocytes Absolute: 0.8 10*3/uL (ref 0.1–1.0)
Monocytes Relative: 11 %
Neutro Abs: 3.8 10*3/uL (ref 1.7–7.7)
Neutrophils Relative %: 57 %
Platelets: 185 10*3/uL (ref 150–400)
RBC: 3.61 MIL/uL — ABNORMAL LOW (ref 3.87–5.11)
RDW: 12.4 % (ref 11.5–15.5)
WBC: 6.7 10*3/uL (ref 4.0–10.5)
nRBC: 0 % (ref 0.0–0.2)

## 2022-10-01 LAB — POC OCCULT BLOOD, ED: Fecal Occult Bld: NEGATIVE

## 2022-10-01 NOTE — ED Provider Notes (Signed)
[facility]-EMERGENCY DEPT Provider Note   CSN: [id number] Arrival date & time: 10/01/22  1301     History  Chief Complaint  Patient presents with   Rectal Bleeding    Amy Ray is a 76 y.o. female.  HPI Patient reports that she has had rectal bleeding.  She reports it is happening mostly with a bowel movement and will be bright red and quite a bit.  She is also had some bleeding into her underwear.  No Associated pain.  Patient reports she has had bleeding in the past.  Usually it was associated with constipation and difficult to pass stool.  Patient reports she also has known diverticulosis.  No fever no vomiting.  No lightheadedness.  She does not feel that she is recently been constipated.    Home Medications Prior to Admission medications   Medication Sig Start Date End Date Taking? Authorizing Provider  Acetaminophen (TYLENOL EX ST ARTHRITIS PAIN PO) Take 2 tablets by mouth daily as needed (for arthritis pain).    [provider]  aspirin 81 MG tablet Take 1 tablet by mouth daily.    [provider]  atropine 1 % ophthalmic solution     [provider]  brimonidine (ALPHAGAN) 0.2 % ophthalmic solution     [provider]  carbamazepine (TEGRETOL) 200 MG tablet TAKE 1 TABLET BY MOUTH  TWICE DAILY 01/07/22   Allwardt, Alyssa M, PA-C  clopidogrel (PLAVIX) 75 MG tablet Take 1 tablet (75 mg total) by mouth daily. 09/06/22   Allwardt, Crist Infante, PA-C  diltiazem (DILACOR XR) 180 MG 24 hr capsule Take 360 mg by mouth daily.    [provider]  dorzolamide (TRUSOPT) 2 % ophthalmic solution     [provider]  famotidine (PEPCID) 40 MG tablet TAKE 1 TABLET BY MOUTH AT  BEDTIME 07/15/21   Malva Limes, MD  furosemide (LASIX) 20 MG tablet Take 20 mg by mouth.    [provider]  hydrocortisone (ANUSOL-HC) 25 MG suppository Place 1 suppository (25 mg total) rectally 2 (two) times daily as needed for  hemorrhoids or anal itching. For 7 days 08/03/22   Charlynne Pander, MD  ketorolac (ACULAR) 0.5 % ophthalmic solution Place 1 drop into the right eye 4 (four) times daily. 05/26/22   [provider]  nortriptyline (PAMELOR) 10 MG capsule TAKE 1 TO 2 CAPSULES BY MOUTH AT BEDTIME 07/29/22   Allwardt, Alyssa M, PA-C  ofloxacin (OCUFLOX) 0.3 % ophthalmic solution     [provider]  polyethylene glycol (MIRALAX / GLYCOLAX) 17 g packet Take 17 g by mouth 2 (two) times daily. 08/03/22   Charlynne Pander, MD  prednisoLONE acetate (PRED FORTE) 1 % ophthalmic suspension  05/26/22   [provider]  SIMBRINZA 1-0.2 % SUSP Apply to eye. 05/27/22   [provider]  simvastatin (ZOCOR) 20 MG tablet TAKE 1 TABLET BY MOUTH DAILY 04/05/22   Allwardt, Alyssa M, PA-C  VITAMIN D PO Take by mouth.    [provider]      Allergies    Augmentin [amoxicillin-pot clavulanate], Quinapril-hydrochlorothiazide, Tape, Tramadol, and Silicone    Review of Systems   Review of Systems  Physical Exam Updated Vital Signs BP (!) 170/85 (BP Location: Right Arm)   Pulse 84   Temp 97.6 F (36.4 C) (Oral)   Resp 16   SpO2 100%  Physical Exam Constitutional:      Comments: Alert nontoxic clinically well in  appearance.  HENT:     Mouth/Throat:     Pharynx: Oropharynx is clear.  Eyes:     Extraocular Movements: Extraocular movements intact.  Cardiovascular:     Rate and Rhythm: Normal rate and regular rhythm.  Pulmonary:     Effort: Pulmonary effort is normal.     Breath sounds: Normal breath sounds.  Abdominal:     General: There is no distension.     Palpations: Abdomen is soft.     Tenderness: There is no abdominal tenderness. There is no guarding.  Genitourinary:    Comments: Perirectal area no blood present.  No significant hemorrhoid.  Digital exam, vault is empty.  Trace yellow stool.  No blood or melena. Musculoskeletal:        General: No swelling or  tenderness. Normal range of motion.  Skin:    General: Skin is warm and dry.  Neurological:     General: No focal deficit present.     Mental Status: She is oriented to person, place, and time.     Coordination: Coordination normal.  Psychiatric:        Mood and Affect: Mood normal.     ED Results / Procedures / Treatments   Labs (all labs ordered are listed, but only abnormal results are displayed) Labs Reviewed  CBC WITH DIFFERENTIAL/PLATELET - Abnormal; Notable for the following components:      Result Value   RBC 3.61 (*)    Hemoglobin 11.9 (*)    MCV 100.8 (*)    All other components within normal limits  BASIC METABOLIC PANEL  CBC WITH DIFFERENTIAL/PLATELET  POC OCCULT BLOOD, ED    EKG None  Radiology No results found.  Procedures Procedures   Angiocath insertion Performed by: Arby Barrette  Consent: Verbal consent obtained. Risks and benefits: risks, benefits and alternatives were discussed Time out: Immediately prior to procedure a "time out" was called to verify the correct patient, procedure, equipment, support staff and site/side marked as required.  Preparation: Patient was prepped and draped in the usual sterile fashion.  Vein Location: right AC   Yes Ultrasound Guided  Gauge: 20   Normal blood return and flush without difficulty Patient tolerance: Patient tolerated the procedure well with no immediate complications.   Medications Ordered in ED Medications - No data to display  ED Course/ Medical Decision Making/ A&P                           Medical Decision Making Amount and/or Complexity of Data Reviewed Labs: ordered.   Reported rectal bleeding at home with observed blood.  She reports this happens periodically.  She may go weeks or more without have any bleeding and then have a fairly large amount of bleeding but usually stops.  Patient has known history of diverticulosis and hemorrhoids.  He is seen by gastroenterologist.  Patient  does not baseline take Plavix and aspirin.  She does not have any pain.  He is otherwise clinically well.  No hypotension or tachycardia.  Proceed with CBC to assess for any anemia.  Patient is rectal exam has no blood or melena in the vault.  Test negative for blood.  Patient's CBC without any anemia.  Stable compared to prior values.  At this time, without any active bleeding, stable CBC and known history of bleeding source that to date has been self-limited, I feel patient is stable for discharge.  He has ongoing risk of Plavix  and aspirin for stroke risk.  If patient has recurrent bleeding or bleeding requiring management or intervention, decisions will have to be made about risk benefits of ongoing Plavix and aspirin and setting of known diverticulosis and bleeding hemorrhoids.  Extensively reviewed with the patient and her daughter and they voiced understanding.        Final Clinical Impression(s) / ED Diagnoses Final diagnoses:  Rectal bleeding    Rx / DC Orders ED Discharge Orders     None         Arby Barrette, MD 10/01/22 1816

## 2022-10-01 NOTE — Discharge Instructions (Signed)
1.  Follow-up with your gastroenterologist for recheck as soon as possible. 2.  Continue to monitor for any persistent bleeding.  At this time with your known history of diverticulosis and taking a combination of aspirin and Plavix, you will have increased risk for bleeding.  At this time your blood counts are stable and there is no blood present in the stool.  Currently it appears more important that you continue your aspirin and Plavix to decrease risk of stroke then to stop it for bleeding. 3.  In the future, choices may have to be made regarding taking aspirin and Plavix with GI bleeding but at this time without evidence of bleeding, normal vital signs and a likely cause, continue to eat a healthy diet and stay active.  The you are at baseline the better your medical problems will be controlled.  However, there may not be much you can do to prevent future bleeding risk, given your medical history.  Return immediately if you have persistent bleeding, lightheadedness, weakness, shortness of breath or other concerning changes.

## 2022-10-01 NOTE — ED Triage Notes (Signed)
Pt arrived via POV, c/o rectal bleeding, bright red blood. Denies any vomiting or urinary sx.

## 2022-10-15 ENCOUNTER — Emergency Department (HOSPITAL_COMMUNITY)
Admission: EM | Admit: 2022-10-15 | Discharge: 2022-10-16 | Disposition: A | Discharge status: Home / Self Care | Payer: Medicare Other | Attending: Emergency Medicine | Admitting: Emergency Medicine

## 2022-10-15 ENCOUNTER — Other Ambulatory Visit: Payer: Self-pay

## 2022-10-15 ENCOUNTER — Emergency Department (HOSPITAL_COMMUNITY): Payer: Medicare Other

## 2022-10-15 DIAGNOSIS — Z8673 Personal history of transient ischemic attack (TIA), and cerebral infarction without residual deficits: Secondary | ICD-10-CM | POA: Diagnosis not present

## 2022-10-15 DIAGNOSIS — Z7982 Long term (current) use of aspirin: Secondary | ICD-10-CM | POA: Insufficient documentation

## 2022-10-15 DIAGNOSIS — Z79899 Other long term (current) drug therapy: Secondary | ICD-10-CM | POA: Insufficient documentation

## 2022-10-15 DIAGNOSIS — H40011 Open angle with borderline findings, low risk, right eye: Secondary | ICD-10-CM | POA: Diagnosis not present

## 2022-10-15 DIAGNOSIS — Z1152 Encounter for screening for COVID-19: Secondary | ICD-10-CM | POA: Diagnosis not present

## 2022-10-15 DIAGNOSIS — Z7902 Long term (current) use of antithrombotics/antiplatelets: Secondary | ICD-10-CM | POA: Diagnosis not present

## 2022-10-15 DIAGNOSIS — R002 Palpitations: Secondary | ICD-10-CM | POA: Diagnosis not present

## 2022-10-15 DIAGNOSIS — I6782 Cerebral ischemia: Secondary | ICD-10-CM | POA: Diagnosis not present

## 2022-10-15 DIAGNOSIS — I10 Essential (primary) hypertension: Secondary | ICD-10-CM | POA: Diagnosis not present

## 2022-10-15 DIAGNOSIS — R519 Headache, unspecified: Secondary | ICD-10-CM | POA: Diagnosis not present

## 2022-10-15 DIAGNOSIS — H4062X0 Glaucoma secondary to drugs, left eye, stage unspecified: Secondary | ICD-10-CM | POA: Diagnosis not present

## 2022-10-15 NOTE — ED Triage Notes (Signed)
Patient reports palpitations for 3 days , hypertensive this evening at home BP= 184/98. No chest pain /denies SOB .

## 2022-10-15 NOTE — ED Provider Triage Note (Signed)
Emergency Medicine Provider Triage Evaluation Note  Amy Ray , a 77 y.o. female  was evaluated in triage.  Pt complains of left-sided weakness, started around 4 PM today, states she feels some paresthesias in her left side, she does note that she has had a stroke in the past and she is only left-sided but feels slightly different, no headaches no change in vision no recent falls, she also notes that she had some heart palpitations and chest pain which is since resolved..  Review of Systems  Positive: Paresthesias, chest pain Negative: Headaches, change in vision  Physical Exam  BP (!) 174/83 (BP Location: Right Arm)   Pulse 85   Temp 98.6 F (37 C)   Resp 16   SpO2 100%  Gen:   Awake, no distress   Resp:  Normal effort  MSK:   Moves extremities without difficulty  Other:  Cranial nerves II through XII grossly intact no difficulty with word finding following two-step commands there is no unilateral weakness present.  Medical Decision Making  Medically screening exam initiated at 11:21 PM.  Appropriate orders placed.  Amy Ray was informed that the remainder of the evaluation will be completed by another provider, this initial triage assessment does not replace that evaluation, and the importance of remaining in the ED until their evaluation is complete.  Lab/ imaging have been ordered will need further workup.   Amy Fennel, PA-C 10/15/22 2322

## 2022-10-16 ENCOUNTER — Encounter (HOSPITAL_COMMUNITY): Payer: Self-pay

## 2022-10-16 DIAGNOSIS — R002 Palpitations: Secondary | ICD-10-CM | POA: Diagnosis not present

## 2022-10-16 LAB — BASIC METABOLIC PANEL
Anion gap: 8 (ref 5–15)
BUN: 11 mg/dL (ref 8–23)
CO2: 23 mmol/L (ref 22–32)
Calcium: 9 mg/dL (ref 8.9–10.3)
Chloride: 108 mmol/L (ref 98–111)
Creatinine, Ser: 1.04 mg/dL — ABNORMAL HIGH (ref 0.44–1.00)
GFR, Estimated: 56 mL/min — ABNORMAL LOW (ref >60)
Glucose, Bld: 100 mg/dL — ABNORMAL HIGH (ref 70–99)
Potassium: 3.4 mmol/L — ABNORMAL LOW (ref 3.5–5.1)
Sodium: 139 mmol/L (ref 135–145)

## 2022-10-16 LAB — MAGNESIUM: Magnesium: 2.1 mg/dL (ref 1.7–2.4)

## 2022-10-16 LAB — CBC WITH DIFFERENTIAL/PLATELET
Abs Immature Granulocytes: 0.02 10*3/uL (ref 0.00–0.07)
Basophils Absolute: 0 10*3/uL (ref 0.0–0.1)
Basophils Relative: 0 %
Eosinophils Absolute: 0.1 10*3/uL (ref 0.0–0.5)
Eosinophils Relative: 1 %
HCT: 34.7 % — ABNORMAL LOW (ref 36.0–46.0)
Hemoglobin: 11.4 g/dL — ABNORMAL LOW (ref 12.0–15.0)
Immature Granulocytes: 0 %
Lymphocytes Relative: 27 %
Lymphs Abs: 1.9 10*3/uL (ref 0.7–4.0)
MCH: 32.9 pg (ref 26.0–34.0)
MCHC: 32.9 g/dL (ref 30.0–36.0)
MCV: 100 fL (ref 80.0–100.0)
Monocytes Absolute: 1 10*3/uL (ref 0.1–1.0)
Monocytes Relative: 14 %
Neutro Abs: 3.9 10*3/uL (ref 1.7–7.7)
Neutrophils Relative %: 58 %
Platelets: 223 10*3/uL (ref 150–400)
RBC: 3.47 MIL/uL — ABNORMAL LOW (ref 3.87–5.11)
RDW: 12.5 % (ref 11.5–15.5)
WBC: 7 10*3/uL (ref 4.0–10.5)
nRBC: 0 % (ref 0.0–0.2)

## 2022-10-16 LAB — RESP PANEL BY RT-PCR (RSV, FLU A&B, COVID)  RVPGX2
Influenza A by PCR: NEGATIVE
Influenza B by PCR: NEGATIVE
Resp Syncytial Virus by PCR: NEGATIVE
SARS Coronavirus 2 by RT PCR: NEGATIVE

## 2022-10-16 LAB — TROPONIN I (HIGH SENSITIVITY)
Troponin I (High Sensitivity): 5 ng/L (ref <18)
Troponin I (High Sensitivity): 5 ng/L (ref <18)

## 2022-10-16 MED ORDER — POTASSIUM CHLORIDE CRYS ER 20 MEQ PO TBCR
40.0000 meq | EXTENDED_RELEASE_TABLET | Freq: Once | ORAL | Status: AC
  Administered 2022-10-16: 40 meq via ORAL
  Filled 2022-10-16: qty 2

## 2022-10-16 NOTE — ED Provider Notes (Signed)
[facility] EMERGENCY DEPARTMENT Provider Note   CSN: [id number] Arrival date & time: 10/15/22  2229     History  Chief Complaint  Patient presents with   Palpitations / Hypertension     Amy Ray is a 77 y.o. female.  The history is provided by the patient and a relative.   Patient with history of hypertension, hyperlipidemia, previous history of CVA presents with multiple complaints. Patient reports over the past several days she has felt that her heart is louder than normal and it is beating faster.  She has had no chest pain or shortness of breath associated with the symptoms.  No syncope.  Denies any excessive caffeine use.  She has never really had this before.  She is now feeling back to baseline.  Denies any known history of irregular heart rhythms   Patient also mentioned several hours prior to arrival she had some tingling in her left hand.  She reports it felt different, but no focal weakness.  She reports distant history of stroke but no residual symptoms.  No other acute neurologic symptoms are reported.  Patient reports she has been ambulating the whole time  (APP note reads patient had weakness, but she did not report this on my evaluation)  She also reports recent back and left hip pain but no recent trauma Past Medical History:  Diagnosis Date   History of diverticulitis of colon 05/20/2015   per patient report    Hyperlipidemia    Hypertension    Stroke (HCC) 03/16/2005   TIA (transient ischemic attack)    Trigeminal neuralgia    Vertigo     Home Medications Prior to Admission medications   Medication Sig Start Date End Date Taking? Authorizing Provider  Acetaminophen (TYLENOL EX ST ARTHRITIS PAIN PO) Take 2 tablets by mouth daily as needed (for arthritis pain).    [provider]  aspirin 81 MG tablet Take 1 tablet by mouth daily.    [provider]  atropine 1 % ophthalmic solution     [provider]   brimonidine (ALPHAGAN) 0.2 % ophthalmic solution     [provider]  carbamazepine (TEGRETOL) 200 MG tablet TAKE 1 TABLET BY MOUTH  TWICE DAILY 01/07/22   Allwardt, Alyssa M, PA-C  clopidogrel (PLAVIX) 75 MG tablet Take 1 tablet (75 mg total) by mouth daily. 09/06/22   Allwardt, Crist Infante, PA-C  diltiazem (DILACOR XR) 180 MG 24 hr capsule Take 360 mg by mouth daily.    [provider]  dorzolamide (TRUSOPT) 2 % ophthalmic solution     [provider]  famotidine (PEPCID) 40 MG tablet TAKE 1 TABLET BY MOUTH AT  BEDTIME 07/15/21   Malva Limes, MD  furosemide (LASIX) 20 MG tablet Take 20 mg by mouth.    [provider]  hydrocortisone (ANUSOL-HC) 25 MG suppository Place 1 suppository (25 mg total) rectally 2 (two) times daily as needed for hemorrhoids or anal itching. For 7 days 08/03/22   Charlynne Pander, MD  ketorolac (ACULAR) 0.5 % ophthalmic solution Place 1 drop into the right eye 4 (four) times daily. 05/26/22   [provider]  nortriptyline (PAMELOR) 10 MG capsule TAKE 1 TO 2 CAPSULES BY MOUTH AT BEDTIME 07/29/22   Allwardt, Alyssa M, PA-C  ofloxacin (OCUFLOX) 0.3 % ophthalmic solution     [provider]  polyethylene glycol (MIRALAX / GLYCOLAX) 17 g packet Take 17 g by mouth 2 (two) times daily. 08/03/22  Charlynne Pander, MD  prednisoLONE acetate (PRED FORTE) 1 % ophthalmic suspension  05/26/22   [provider]  SIMBRINZA 1-0.2 % SUSP Apply to eye. 05/27/22   [provider]  simvastatin (ZOCOR) 20 MG tablet TAKE 1 TABLET BY MOUTH DAILY 04/05/22   Allwardt, Alyssa M, PA-C  VITAMIN D PO Take by mouth.    [provider]      Allergies    Augmentin [amoxicillin-pot clavulanate], Quinapril-hydrochlorothiazide, Tape, Tramadol, and Silicone    Review of Systems   Review of Systems  Constitutional:  Negative for fever.  Respiratory:  Negative for shortness of breath.   Cardiovascular:  Positive for  palpitations. Negative for chest pain.  Neurological:  Negative for syncope.    Physical Exam Updated Vital Signs BP 132/73   Pulse 76   Temp 98.2 F (36.8 C)   Resp 15   Ht 1.524 m (5')   Wt 86.2 kg   SpO2 100%   BMI 37.11 kg/m  Physical Exam CONSTITUTIONAL: Elderly, no acute distress HEAD: Normocephalic/atraumatic EYES: EOMI/PERRL, no nystagmus, no ptosis ENMT: Mucous membranes moist NECK: supple no meningeal signs Spine: -No T and L tenderness noted CV: S1/S2 noted, soft murmur noted over the left upper sternal border LUNGS: Lungs are clear to auscultation bilaterally, no apparent distress ABDOMEN: soft, nontender NEURO:Awake/alert, face symmetric, no arm or leg drift is noted Equal 5/5 strength with shoulder abduction, elbow flex/extension, wrist flex/extension in upper extremities and equal hand grips bilaterally Equal 5/5 strength with hip flexion,knee flex/extension, foot dorsi/plantar flexion Cranial nerves 3/4/5/6/04/18/09/11/12 tested and intact Sensation to light touch intact in all extremities EXTREMITIES: pulses normal, full ROM SKIN: warm, color normal PSYCH: no abnormalities of mood noted  ED Results / Procedures / Treatments   Labs (all labs ordered are listed, but only abnormal results are displayed) Labs Reviewed  BASIC METABOLIC PANEL - Abnormal; Notable for the following components:      Result Value   Potassium 3.4 (*)    Glucose, Bld 100 (*)    Creatinine, Ser 1.04 (*)    GFR, Estimated 56 (*)    All other components within normal limits  CBC WITH DIFFERENTIAL/PLATELET - Abnormal; Notable for the following components:   RBC 3.47 (*)    Hemoglobin 11.4 (*)    HCT 34.7 (*)    All other components within normal limits  RESP PANEL BY RT-PCR (RSV, FLU A&B, COVID)  RVPGX2  MAGNESIUM  TROPONIN I (HIGH SENSITIVITY)  TROPONIN I (HIGH SENSITIVITY)    EKG EKG Interpretation  Date/Time:  Friday October 15 2022 23:04:10 EST Ventricular Rate:  86 PR  Interval:  150 QRS Duration: 72 QT Interval:  342 QTC Calculation: 409 R Axis:   47 Text Interpretation: Normal sinus rhythm Normal ECG No significant change since last tracing Confirmed by Zadie Rhine ([id number]) on 10/16/2022 5:17:40 AM  Radiology DG Chest 2 View  Result Date: 10/16/2022 CLINICAL DATA:  Palpitations. EXAM: CHEST - 2 VIEW COMPARISON:  January 01, 2021 FINDINGS: The heart size and mediastinal contours are within normal limits. Both lungs are clear. Radiopaque surgical clips are seen within the right upper quadrant. Bilateral shoulder replacements are noted. Multilevel degenerative changes are seen throughout the thoracic spine. IMPRESSION: No active cardiopulmonary disease. Electronically Signed   By: Aram Candela M.D.   On: 10/16/2022 00:11   CT Head Wo Contrast  Result Date: 10/15/2022 CLINICAL DATA:  Headache. EXAM: CT HEAD WITHOUT CONTRAST TECHNIQUE: Contiguous axial images were obtained from  the base of the skull through the vertex without intravenous contrast. RADIATION DOSE REDUCTION: This exam was performed according to the departmental dose-optimization program which includes automated exposure control, adjustment of the mA and/or kV according to patient size and/or use of iterative reconstruction technique. COMPARISON:  CT head dated August 29, 2022. FINDINGS: Brain: No evidence of acute infarction, hemorrhage, hydrocephalus, extra-axial collection or mass lesion/mass effect. Diffuse low-attenuation of the periventricular white matter presumed chronic microvascular ischemic changes. Mild cerebral atrophy. Vascular: No hyperdense vessel or unexpected calcification. Skull: Normal. Negative for fracture or focal lesion. Sinuses/Orbits: No acute finding. Other: None. IMPRESSION: 1. No acute intracranial abnormality. 2. Chronic microvascular ischemic changes of the periventricular white matter and mild cerebral atrophy. Electronically Signed   By: Larose Hires D.O.   On:  10/15/2022 23:45    Procedures Procedures    Medications Ordered in ED Medications  potassium chloride SA (KLOR-CON M) CR tablet 40 mEq (40 mEq Oral Given 10/16/22 1694)    ED Course/ Medical Decision Making/ A&P Clinical Course as of 10/16/22 0639  Sat Oct 16, 2022  0606 Patient overall very well-appearing.  She presented for multiple complaints.  Her biggest issue seems to be palpitations.  She reports she hears her heart beating at home and it feels fast.  No known history of A-fib as far as I can tell.  While Emergency Department has been no dysrhythmia.  EKG has been sinus.  She would benefit from outpatient cardiology evaluation and likely monitoring.  Patient otherwise very well-appearing.  Low suspicion for other acute cardiopulmonary emergency.  Low suspicion for acute neurologic emergency [DW]  [phone] Patient monitored here, no dysrhythmias.  She feels back to baseline.  At this point I feel she is safe for discharge home and outpatient management.  Patient will follow with cardiology as an outpatient [DW]    Clinical Course User Index [DW] Zadie Rhine, MD                           Medical Decision Making Risk Prescription drug management.   This patient presents to the ED for concern of palpitations, this involves an extensive number of treatment options, and is a complaint that carries with it a high risk of complications and morbidity.  The differential diagnosis includes but is not limited to SVT, atrial fibrillation, ventricular tachycardia, atrial flutter, sinus tachycardia, PVCs, dysrhythmia  Comorbidities that complicate the patient evaluation: Patient's presentation is complicated by their history of hypertension CVA  Additional history obtained: Additional history obtained from family Records reviewed Primary Care Documents  Lab Tests: I Ordered, and personally interpreted labs.  The pertinent results include: Very mild hypokalemia  Imaging Studies  ordered: I ordered imaging studies including headCT scan head and X-ray chest   I independently visualized and interpreted imaging which showed no acute findings I agree with the radiologist interpretation  Cardiac Monitoring: The patient was maintained on a cardiac monitor.  I personally viewed and interpreted the cardiac monitor which showed an underlying rhythm of:  sinus rhythm  Medicines ordered and prescription drug management: I ordered medication including potassium for mild hypokalemia  Test Considered: Considered further neuroimaging, patient is back to baseline.  On my evaluation she only mentioned paresthesias without any significant weakness.  Low suspicion for acute CVA at this time  Reevaluation: After the interventions noted above, I reevaluated the patient and found that they have :stayed the same  Complexity of problems addressed:  Patient's presentation is most consistent with  acute presentation with potential threat to life or bodily function  Disposition: After consideration of the diagnostic results and the patient's response to treatment,  I feel that the patent would benefit from discharge   .           Final Clinical Impression(s) / ED Diagnoses Final diagnoses:  Palpitations    Rx / DC Orders ED Discharge Orders          Ordered    Ambulatory referral to Cardiology       Comments: If you have not heard from the Cardiology office within the next 72 hours please call [phone].   10/16/22 7124              Ripley Fraise, MD 10/16/22 [id number]

## 2022-10-22 ENCOUNTER — Ambulatory Visit (INDEPENDENT_AMBULATORY_CARE_PROVIDER_SITE_OTHER): Payer: Medicare Other

## 2022-10-22 DIAGNOSIS — Z0000 Encounter for general adult medical examination without abnormal findings: Secondary | ICD-10-CM

## 2022-10-22 NOTE — Progress Notes (Signed)
I connected with  Amy Ray on 10/22/22 by a audio enabled telemedicine application and verified that I am speaking with the correct person using two identifiers.  Patient Location: Home  Provider Location: Office/Clinic  I discussed the limitations of evaluation and management by telemedicine. The patient expressed understanding and agreed to proceed.   Subjective:   Amy Ray is a 77 y.o. female who presents for Medicare Annual (Subsequent) preventive examination.  Review of Systems     Cardiac Risk Factors include: advanced age (>63men, >57 women);obesity (BMI >30kg/m2);dyslipidemia;hypertension     Objective:    There were no vitals filed for this visit. There is no height or weight on file to calculate BMI.     10/22/2022   10:56 AM 10/15/2022   11:09 PM 08/03/2022    3:00 PM 03/25/2022   11:54 PM 11/11/2021    8:17 AM 10/15/2021   11:09 AM 03/19/2021    3:26 AM  Advanced Directives  Does Patient Have a Medical Advance Directive? Yes No No No No No No  Type of Estate agent of [place];Living will        Copy of Healthcare Power of Attorney in Chart? No - copy requested        Would patient like information on creating a medical advance directive?   No - Patient declined   No - Patient declined No - Patient declined    Current Medications (verified) Outpatient Encounter Medications as of 10/22/2022  Medication Sig   Acetaminophen (TYLENOL EX ST ARTHRITIS PAIN PO) Take 2 tablets by mouth daily as needed (for arthritis pain).   aspirin 81 MG tablet Take 1 tablet by mouth daily.   carbamazepine (TEGRETOL) 200 MG tablet TAKE 1 TABLET BY MOUTH  TWICE DAILY   clopidogrel (PLAVIX) 75 MG tablet Take 1 tablet (75 mg total) by mouth daily.   diltiazem (DILACOR XR) 180 MG 24 hr capsule Take 360 mg by mouth daily.   famotidine (PEPCID) 40 MG tablet TAKE 1 TABLET BY MOUTH AT  BEDTIME   furosemide (LASIX) 20 MG tablet Take 20 mg by mouth.   hydrocortisone  (ANUSOL-HC) 25 MG suppository Place 1 suppository (25 mg total) rectally 2 (two) times daily as needed for hemorrhoids or anal itching. For 7 days   ketorolac (ACULAR) 0.5 % ophthalmic solution Place 1 drop into the right eye 4 (four) times daily.   nortriptyline (PAMELOR) 10 MG capsule TAKE 1 TO 2 CAPSULES BY MOUTH AT BEDTIME   polyethylene glycol (MIRALAX / GLYCOLAX) 17 g packet Take 17 g by mouth 2 (two) times daily.   SIMBRINZA 1-0.2 % SUSP Apply to eye.   simvastatin (ZOCOR) 20 MG tablet TAKE 1 TABLET BY MOUTH DAILY   VITAMIN D PO Take by mouth.   [DISCONTINUED] ALPRAZolam (XANAX) 0.25 MG tablet Take 0.5-1 tablets (0.125-0.25 mg total) by mouth at bedtime as needed. for sleep   [DISCONTINUED] atropine 1 % ophthalmic solution    [DISCONTINUED] brimonidine (ALPHAGAN) 0.2 % ophthalmic solution    [DISCONTINUED] carbamazepine (TEGRETOL) 200 MG tablet Take 1 tablet (200 mg total) by mouth 2 (two) times daily.   [DISCONTINUED] clopidogrel (PLAVIX) 75 MG tablet TAKE 1 TABLET BY MOUTH  DAILY   [DISCONTINUED] cyanocobalamin 1000 MCG tablet Take 1,000 mcg by mouth daily.   [DISCONTINUED] diltiazem (DILT-XR) 180 MG 24 hr capsule TAKE 2 CAPSULES BY MOUTH  DAILY   [DISCONTINUED] docusate sodium (COLACE) 100 MG capsule Take 100 mg by mouth 2 (two) times daily  as needed for mild constipation.   [DISCONTINUED] dorzolamide (TRUSOPT) 2 % ophthalmic solution    [DISCONTINUED] fluticasone (FLONASE) 50 MCG/ACT nasal spray Place 2 sprays into both nostrils daily.   [DISCONTINUED] furosemide (LASIX) 20 MG tablet TAKE 1 TABLET BY MOUTH  DAILY AS NEEDED FOR EDEMA   [DISCONTINUED] lidocaine (LIDODERM) 5 % Place 1 patch onto the skin daily. Remove & Discard patch within 12 hours or as directed by MD   [DISCONTINUED] nortriptyline (PAMELOR) 10 MG capsule Take 1-2 capsules (10-20 mg total) by mouth at bedtime.   [DISCONTINUED] ofloxacin (OCUFLOX) 0.3 % ophthalmic solution    [DISCONTINUED] prednisoLONE acetate (PRED  FORTE) 1 % ophthalmic suspension    [DISCONTINUED] simvastatin (ZOCOR) 20 MG tablet TAKE 1 TABLET BY MOUTH  DAILY   No facility-administered encounter medications on file as of 10/22/2022.    Allergies (verified) Augmentin [amoxicillin-pot clavulanate], Quinapril-hydrochlorothiazide, Tape, Tramadol, and Silicone   History: Past Medical History:  Diagnosis Date   History of diverticulitis of colon 05/20/2015   per patient report    Hyperlipidemia    Hypertension    Stroke ([place]) 03/16/2005   TIA (transient ischemic attack)    Trigeminal neuralgia    Vertigo    Past Surgical History:  Procedure Laterality Date   CHOLECYSTECTOMY  2000   HERNIA REPAIR     umbilical hernia as a child   Pseudophakia OS Left 02/12/2020   Dr. Ellin Mayhew   REPLACEMENT TOTAL KNEE Bilateral    [place] Left    Family History  Problem Relation Age of Onset   Stroke Mother    Heart attack Mother    Hypertension Mother    Alzheimer's disease Father    Diabetes Father    Hypertension Sister    Hypertension Sister    Hypertension Sister    Hypertension Sister    Hypertension Sister    Breast cancer Sister 40   Diabetes Brother    Breast cancer Maternal Aunt 55   Breast cancer Maternal Aunt    Social History   Socioeconomic History   Marital status: Married    Spouse name: Not on file   Number of children: 4   Years of education: Not on file   Highest education level: High school graduate  Occupational History   Occupation: Retired  Tobacco Use   Smoking status: Never   Smokeless tobacco: Never  Vaping Use   Vaping Use: Never used  Substance and Sexual Activity   Alcohol use: No    Alcohol/week: 0.0 standard drinks of alcohol   Drug use: No   Sexual activity: Not on file  Other Topics Concern   Not on file  Social History Narrative   Not on file   Social Determinants of Health   Financial Resource Strain: Low Risk  (10/22/2022)   Overall Financial Resource Strain (CARDIA)     Difficulty of Paying Living Expenses: Not hard at all  Food Insecurity: No Food Insecurity (10/22/2022)   Hunger Vital Sign    Worried About Running Out of Food in the Last Year: Never true    Ran Out of Food in the Last Year: Never true  Transportation Needs: No Transportation Needs (10/22/2022)   PRAPARE - Hydrologist (Medical): No    Lack of Transportation (Non-Medical): No  Physical Activity: Sufficiently Active (10/22/2022)   Exercise Vital Sign    Days of Exercise per Week: 5 days    Minutes of Exercise per Session: 30 min  Stress: No Stress Concern Present (10/22/2022)   [place]    Feeling of Stress : Not at all  Social Connections: Moderately Isolated (10/22/2022)   Social Connection and Isolation Panel [NHANES]    Frequency of Communication with Friends and Family: More than three times a week    Frequency of Social Gatherings with Friends and Family: More than three times a week    Attends Religious Services: More than 4 times per year    Active Member of [employer] or Organizations: No    Attends Archivist Meetings: Never    Marital Status: Separated    Tobacco Counseling Counseling given: Not Answered   Clinical Intake:  Pre-visit preparation completed: Yes  Pain : No/denies pain     BMI - recorded: 37.11 Nutritional Status: BMI > 30  Obese Nutritional Risks: None Diabetes: No  How often do you need to have someone help you when you read instructions, pamphlets, or other written materials from your doctor or pharmacy?: 1 - Never  Diabetic?no  Interpreter Needed?: No  Information entered by :: Charlott Rakes, LPN   Activities of Daily Living    10/22/2022   10:51 AM  In your present state of health, do you have any difficulty performing the following activities:  Hearing? 1  Vision? 0  Difficulty concentrating or making decisions? 1  Walking or  climbing stairs? 0  Dressing or bathing? 0  Doing errands, shopping? 0  Preparing Food and eating ? N  Using the Toilet? N  In the past six months, have you accidently leaked urine? N  Do you have problems with loss of bowel control? N  Managing your Medications? N  Managing your Finances? N  Housekeeping or managing your Housekeeping? N    Patient Care Team: Allwardt, Randa Evens, PA-C as PCP - General (Physician Assistant) Anell Barr, OD as Consulting Physician (Optometry) Earnestine Leys, MD (Orthopedic Surgery) Yolonda Kida, MD as Consulting Physician (Cardiology)  Indicate any recent Medical Services you may have received from other than [facility] providers in the past year (date may be approximate).     Assessment:   This is a routine wellness examination for [place].  Hearing/Vision screen Hearing Screening - Comments:: Pt stated HOH  Vision Screening - Comments:: Pt follows up with Dr Herbert Deaner for annual eye exams   Dietary issues and exercise activities discussed: Current Exercise Habits: Home exercise routine, Type of exercise: Other - see comments, Time (Minutes): 30, Frequency (Times/Week): 5, Weekly Exercise (Minutes/Week): 150   Goals Addressed             This Visit's Progress    Patient Stated       Continue to eat right        Depression Screen    10/22/2022   10:48 AM 10/15/2021   11:07 AM 06/27/2020    9:00 AM 06/25/2019    9:23 AM 06/21/2018   11:09 AM 06/16/2017   10:47 AM 01/17/2017    9:48 AM  PHQ 2/9 Scores  PHQ - 2 Score 0 0 2 0 1 2 0  PHQ- 9 Score   8   4 3     Fall Risk    10/22/2022   10:50 AM 04/20/2022    9:28 AM 11/11/2021    8:17 AM 10/15/2021   11:10 AM 07/17/2021   12:56 PM  Fall Risk   Falls in the past year? 1 0 1 1 1  Number falls in past yr: 1 0 0 1 1  Injury with Fall? 0 0 0 0 1  Risk for fall due to : History of fall(s);Impaired mobility;Impaired balance/gait History of fall(s)  Impaired vision;Impaired balance/gait;Impaired  mobility History of fall(s)  Follow up Falls prevention discussed Falls evaluation completed  Falls prevention discussed Falls evaluation completed    FALL RISK PREVENTION PERTAINING TO THE HOME:  Any stairs in or around the home? No  If so, are there any without handrails? No  Home free of loose throw rugs in walkways, pet beds, electrical cords, etc? Yes  Adequate lighting in your home to reduce risk of falls? Yes   ASSISTIVE DEVICES UTILIZED TO PREVENT FALLS:  Life alert? Yes  Use of a cane, walker or w/c? Yes  Grab bars in the bathroom? Yes  Shower chair or bench in shower? Yes  Elevated toilet seat or a handicapped toilet? No   TIMED UP AND GO:  Was the test performed? No .   Cognitive Function:    10/19/2021   11:35 AM  MMSE - Mini Mental State Exam  Orientation to time 5  Orientation to Place 5  Registration 3  Attention/ Calculation 5  Recall 3  Language- name 2 objects 2  Language- repeat 1  Language- follow 3 step command 3  Language- read & follow direction 1  Write a sentence 1  Copy design 1  Total score 30      11/11/2021    8:00 AM  Montreal Cognitive Assessment   Visuospatial/ Executive (0/5) 3  Naming (0/3) 2  Attention: Read list of digits (0/2) 2  Attention: Read list of letters (0/1) 1  Attention: Serial 7 subtraction starting at 100 (0/3) 0  Language: Repeat phrase (0/2) 2  Language : Fluency (0/1) 0  Abstraction (0/2) 1  Delayed Recall (0/5) 1  Orientation (0/6) 5  Total 17  Adjusted Score (based on education) 18      10/22/2022   10:52 AM 10/15/2021   11:12 AM 06/25/2019    9:30 AM  6CIT Screen  What Year? 0 points 0 points 0 points  What month? 0 points 0 points 0 points  What time? 0 points  0 points  Count back from 20 0 points  0 points  Months in reverse 0 points  0 points  Repeat phrase 0 points  2 points  Total Score 0 points  2 points    Immunizations Immunization History  Administered Date(s) Administered   COVID-19,  mRNA, vaccine(Comirnaty)12 years and older 07/14/2022   Fluad Quad(high Dose 65+) 06/27/2020, 07/17/2021   Influenza, High Dose Seasonal PF 07/12/2016, 06/16/2017, 06/21/2018, 06/22/2019, 07/14/2022   Influenza-Unspecified 05/12/2015   Moderna Covid-19 Vaccine Bivalent Booster 25yrs & up 05/11/2021   Moderna SARS-COV2 Booster Vaccination 08/07/2020, 05/11/2021   Moderna Sars-Covid-2 Vaccination 11/24/2019, 12/24/2019, 08/07/2020   Pneumococcal Conjugate-13 12/05/2014   Pneumococcal Polysaccharide-23 05/09/2013   Tdap 08/12/2011   Zoster Recombinat (Shingrix) 07/20/2021, 09/25/2021    TDAP status: Due, Education has been provided regarding the importance of this vaccine. Advised may receive this vaccine at local pharmacy or Health Dept. Aware to provide a copy of the vaccination record if obtained from local pharmacy or Health Dept. Verbalized acceptance and understanding.  Flu Vaccine status: Up to date  Pneumococcal vaccine status: Up to date  Covid-19 vaccine status: Completed vaccines  Qualifies for Shingles Vaccine? Yes   Zostavax completed Yes   Shingrix Completed?: Yes  Screening Tests Health Maintenance  Topic Date Due   DTaP/Tdap/Td (2 - Td or Tdap) 08/11/2021   COVID-19 Vaccine (6 - 2023-24 season) 09/08/2022   Medicare Annual Wellness (AWV)  10/23/2023   DEXA SCAN  08/06/2024   Fecal DNA (Cologuard)  03/27/2025   Pneumonia Vaccine 68+ Years old  Completed   INFLUENZA VACCINE  Completed   Hepatitis C Screening  Completed   Zoster Vaccines- Shingrix  Completed   HPV VACCINES  Aged Out   COLONOSCOPY (Pts 45-63yrs Insurance coverage will need to be confirmed)  [place] Maintenance Due  Topic Date Due   DTaP/Tdap/Td (2 - Td or Tdap) 08/11/2021   COVID-19 Vaccine (6 - 2023-24 season) 09/08/2022    Colorectal cancer screening: Type of screening: Cologuard. Completed 03/27/22. Repeat every 3 years  Mammogram status: Completed  06/16/22. Repeat every year  Bone Density status: Completed 08/07/19. Results reflect: Bone density results: NORMAL. Repeat every 5 years.   Additional Screening:  Hepatitis C Screening: Completed 06/23/18  Vision Screening: Recommended annual ophthalmology exams for early detection of glaucoma and other disorders of the eye. Is the patient up to date with their annual eye exam?  Yes  Who is the provider or what is the name of the office in which the patient attends annual eye exams? Dr Herbert Deaner  If pt is not established with a provider, would they like to be referred to a provider to establish care? .   Dental Screening: Recommended annual dental exams for proper oral hygiene  Community Resource Referral / Chronic Care Management: CRR required this visit?  No   CCM required this visit?  No      Plan:     I have personally reviewed and noted the following in the patient's chart:   Medical and social history Use of alcohol, tobacco or illicit drugs  Current medications and supplements including opioid prescriptions. Patient is not currently taking opioid prescriptions. Functional ability and status Nutritional status Physical activity Advanced directives List of other physicians Hospitalizations, surgeries, and ER visits in previous 12 months Vitals Screenings to include cognitive, depression, and falls Referrals and appointments  In addition, I have reviewed and discussed with patient certain preventive protocols, quality metrics, and best practice recommendations. A written personalized care plan for preventive services as well as general preventive health recommendations were provided to patient.     Willette Brace, LPN   [id number]   Nurse Notes: none

## 2022-10-22 NOTE — Patient Instructions (Signed)
Amy Ray , Thank you for taking time to come for your Medicare Wellness Visit. I appreciate your ongoing commitment to your health goals. Please review the following plan we discussed and let me know if I can assist you in the future.   These are the goals we discussed:  Goals      DIET - EAT MORE VEGETABLES     Recommend increasing amount of vegetables to 2-3 servings a day.     Patient Stated     None at this time      Patient Stated     Continue to eat right         This is a list of the screening recommended for you and due dates:  Health Maintenance  Topic Date Due   DTaP/Tdap/Td vaccine (2 - Td or Tdap) 08/11/2021   COVID-19 Vaccine (6 - 2023-24 season) 09/08/2022   Medicare Annual Wellness Visit  10/23/2023   DEXA scan (bone density measurement)  08/06/2024   Cologuard (Stool DNA test)  03/27/2025   Pneumonia Vaccine  Completed   Flu Shot  Completed   Hepatitis C Screening: USPSTF Recommendation to screen - Ages 18-79 yo.  Completed   Zoster (Shingles) Vaccine  Completed   HPV Vaccine  Aged Out   Colon Cancer Screening  Discontinued    Advanced directives: Advance directive discussed with you today. Even though you declined this today please call our office should you change your mind and we can give you the proper paperwork for you to fill out.  Conditions/risks identified: continue to eat right   Next appointment: Follow up in one year for your annual wellness visit    Preventive Care 65 Years and Older, Female Preventive care refers to lifestyle choices and visits with your health care provider that can promote health and wellness. What does preventive care include? A yearly physical exam. This is also called an annual well check. Dental exams once or twice a year. Routine eye exams. Ask your health care provider how often you should have your eyes checked. Personal lifestyle choices, including: Daily care of your teeth and gums. Regular physical  activity. Eating a healthy diet. Avoiding tobacco and drug use. Limiting alcohol use. Practicing safe sex. Taking low-dose aspirin every day. Taking vitamin and mineral supplements as recommended by your health care provider. What happens during an annual well check? The services and screenings done by your health care provider during your annual well check will depend on your age, overall health, lifestyle risk factors, and family history of disease. Counseling  Your health care provider may ask you questions about your: Alcohol use. Tobacco use. Drug use. Emotional well-being. Home and relationship well-being. Sexual activity. Eating habits. History of falls. Memory and ability to understand (cognition). Work and work Statistician. Reproductive health. Screening  You may have the following tests or measurements: Height, weight, and BMI. Blood pressure. Lipid and cholesterol levels. These may be checked every 5 years, or more frequently if you are over 76 years old. Skin check. Lung cancer screening. You may have this screening every year starting at age 44 if you have a 30-pack-year history of smoking and currently smoke or have quit within the past 15 years. Fecal occult blood test (FOBT) of the stool. You may have this test every year starting at age 1. Flexible sigmoidoscopy or colonoscopy. You may have a sigmoidoscopy every 5 years or a colonoscopy every 10 years starting at age 18. Hepatitis C blood test. Hepatitis  B blood test. Sexually transmitted disease (STD) testing. Diabetes screening. This is done by checking your blood sugar (glucose) after you have not eaten for a while (fasting). You may have this done every 1-3 years. Bone density scan. This is done to screen for osteoporosis. You may have this done starting at age 63. Mammogram. This may be done every 1-2 years. Talk to your health care provider about how often you should have regular mammograms. Talk with your  health care provider about your test results, treatment options, and if necessary, the need for more tests. Vaccines  Your health care provider may recommend certain vaccines, such as: Influenza vaccine. This is recommended every year. Tetanus, diphtheria, and acellular pertussis (Tdap, Td) vaccine. You may need a Td booster every 10 years. Zoster vaccine. You may need this after age 65. Pneumococcal 13-valent conjugate (PCV13) vaccine. One dose is recommended after age 36. Pneumococcal polysaccharide (PPSV23) vaccine. One dose is recommended after age 20. Talk to your health care provider about which screenings and vaccines you need and how often you need them. This information is not intended to replace advice given to you by your health care provider. Make sure you discuss any questions you have with your health care provider. Document Released: 10/24/2015 Document Revised: 06/16/2016 Document Reviewed: 07/29/2015 Elsevier Interactive Patient Education  2017 [place] Prevention in the Home Falls can cause injuries. They can happen to people of all ages. There are many things you can do to make your home safe and to help prevent falls. What can I do on the outside of my home? Regularly fix the edges of walkways and driveways and fix any cracks. Remove anything that might make you trip as you walk through a door, such as a raised step or threshold. Trim any bushes or trees on the path to your home. Use bright outdoor lighting. Clear any walking paths of anything that might make someone trip, such as rocks or tools. Regularly check to see if handrails are loose or broken. Make sure that both sides of any steps have handrails. Any raised decks and porches should have guardrails on the edges. Have any leaves, snow, or ice cleared regularly. Use sand or salt on walking paths during winter. Clean up any spills in your garage right away. This includes oil or grease spills. What can I  do in the bathroom? Use night lights. Install grab bars by the toilet and in the tub and shower. Do not use towel bars as grab bars. Use non-skid mats or decals in the tub or shower. If you need to sit down in the shower, use a plastic, non-slip stool. Keep the floor dry. Clean up any water that spills on the floor as soon as it happens. Remove soap buildup in the tub or shower regularly. Attach bath mats securely with double-sided non-slip rug tape. Do not have throw rugs and other things on the floor that can make you trip. What can I do in the bedroom? Use night lights. Make sure that you have a light by your bed that is easy to reach. Do not use any sheets or blankets that are too big for your bed. They should not hang down onto the floor. Have a firm chair that has side arms. You can use this for support while you get dressed. Do not have throw rugs and other things on the floor that can make you trip. What can I do in the kitchen? Clean up any  spills right away. Avoid walking on wet floors. Keep items that you use a lot in easy-to-reach places. If you need to reach something above you, use a strong step stool that has a grab bar. Keep electrical cords out of the way. Do not use floor polish or wax that makes floors slippery. If you must use wax, use non-skid floor wax. Do not have throw rugs and other things on the floor that can make you trip. What can I do with my stairs? Do not leave any items on the stairs. Make sure that there are handrails on both sides of the stairs and use them. Fix handrails that are broken or loose. Make sure that handrails are as long as the stairways. Check any carpeting to make sure that it is firmly attached to the stairs. Fix any carpet that is loose or worn. Avoid having throw rugs at the top or bottom of the stairs. If you do have throw rugs, attach them to the floor with carpet tape. Make sure that you have a light switch at the top of the stairs  and the bottom of the stairs. If you do not have them, ask someone to add them for you. What else can I do to help prevent falls? Wear shoes that: Do not have high heels. Have rubber bottoms. Are comfortable and fit you well. Are closed at the toe. Do not wear sandals. If you use a stepladder: Make sure that it is fully opened. Do not climb a closed stepladder. Make sure that both sides of the stepladder are locked into place. Ask someone to hold it for you, if possible. Clearly mark and make sure that you can see: Any grab bars or handrails. First and last steps. Where the edge of each step is. Use tools that help you move around (mobility aids) if they are needed. These include: Canes. Walkers. Scooters. Crutches. Turn on the lights when you go into a dark area. Replace any light bulbs as soon as they burn out. Set up your furniture so you have a clear path. Avoid moving your furniture around. If any of your floors are uneven, fix them. If there are any pets around you, be aware of where they are. Review your medicines with your doctor. Some medicines can make you feel dizzy. This can increase your chance of falling. Ask your doctor what other things that you can do to help prevent falls. This information is not intended to replace advice given to you by your health care provider. Make sure you discuss any questions you have with your health care provider. Document Released: 07/24/2009 Document Revised: 03/04/2016 Document Reviewed: 11/01/2014 Elsevier Interactive Patient Education  2017 [employer].

## 2022-10-24 ENCOUNTER — Emergency Department (HOSPITAL_COMMUNITY)
Admission: EM | Admit: 2022-10-24 | Discharge: 2022-10-24 | Disposition: A | Discharge status: Home / Self Care | Payer: Medicare Other | Attending: Emergency Medicine | Admitting: Emergency Medicine

## 2022-10-24 ENCOUNTER — Encounter (HOSPITAL_COMMUNITY): Payer: Self-pay

## 2022-10-24 ENCOUNTER — Other Ambulatory Visit: Payer: Self-pay

## 2022-10-24 ENCOUNTER — Emergency Department (HOSPITAL_COMMUNITY): Payer: Medicare Other

## 2022-10-24 DIAGNOSIS — Z7901 Long term (current) use of anticoagulants: Secondary | ICD-10-CM | POA: Diagnosis not present

## 2022-10-24 DIAGNOSIS — Z7982 Long term (current) use of aspirin: Secondary | ICD-10-CM | POA: Diagnosis not present

## 2022-10-24 DIAGNOSIS — R0789 Other chest pain: Secondary | ICD-10-CM | POA: Diagnosis not present

## 2022-10-24 DIAGNOSIS — Z8673 Personal history of transient ischemic attack (TIA), and cerebral infarction without residual deficits: Secondary | ICD-10-CM | POA: Diagnosis not present

## 2022-10-24 DIAGNOSIS — M542 Cervicalgia: Secondary | ICD-10-CM | POA: Diagnosis not present

## 2022-10-24 DIAGNOSIS — R079 Chest pain, unspecified: Secondary | ICD-10-CM | POA: Insufficient documentation

## 2022-10-24 DIAGNOSIS — I10 Essential (primary) hypertension: Secondary | ICD-10-CM | POA: Insufficient documentation

## 2022-10-24 DIAGNOSIS — R404 Transient alteration of awareness: Secondary | ICD-10-CM | POA: Diagnosis not present

## 2022-10-24 DIAGNOSIS — I700 Atherosclerosis of aorta: Secondary | ICD-10-CM | POA: Diagnosis not present

## 2022-10-24 DIAGNOSIS — Z743 Need for continuous supervision: Secondary | ICD-10-CM | POA: Diagnosis not present

## 2022-10-24 LAB — CBC
HCT: 39.1 % (ref 36.0–46.0)
Hemoglobin: 12.7 g/dL (ref 12.0–15.0)
MCH: 32.8 pg (ref 26.0–34.0)
MCHC: 32.5 g/dL (ref 30.0–36.0)
MCV: 101 fL — ABNORMAL HIGH (ref 80.0–100.0)
Platelets: 190 10*3/uL (ref 150–400)
RBC: 3.87 MIL/uL (ref 3.87–5.11)
RDW: 12.7 % (ref 11.5–15.5)
WBC: 5.1 10*3/uL (ref 4.0–10.5)
nRBC: 0 % (ref 0.0–0.2)

## 2022-10-24 LAB — BASIC METABOLIC PANEL
Anion gap: 12 (ref 5–15)
BUN: 10 mg/dL (ref 8–23)
CO2: 22 mmol/L (ref 22–32)
Calcium: 9 mg/dL (ref 8.9–10.3)
Chloride: 107 mmol/L (ref 98–111)
Creatinine, Ser: 0.89 mg/dL (ref 0.44–1.00)
GFR, Estimated: >60 mL/min (ref >60)
Glucose, Bld: 101 mg/dL — ABNORMAL HIGH (ref 70–99)
Potassium: 3.4 mmol/L — ABNORMAL LOW (ref 3.5–5.1)
Sodium: 141 mmol/L (ref 135–145)

## 2022-10-24 LAB — TROPONIN I (HIGH SENSITIVITY)
Troponin I (High Sensitivity): 5 ng/L (ref <18)
Troponin I (High Sensitivity): 8 ng/L (ref <18)

## 2022-10-24 MED ORDER — PANTOPRAZOLE SODIUM 20 MG PO TBEC: 20.0000 mg | DELAYED_RELEASE_TABLET | Freq: Every day | ORAL | 0 refills | Status: DC

## 2022-10-24 MED ORDER — ACETAMINOPHEN 325 MG PO TABS
650.0000 mg | ORAL_TABLET | Freq: Once | ORAL | Status: AC
  Administered 2022-10-24: 650 mg via ORAL
  Filled 2022-10-24: qty 2

## 2022-10-24 NOTE — ED Provider Notes (Signed)
[facility] EMERGENCY DEPARTMENT Provider Note   CSN: [id number] Arrival date & time: 10/24/22  0755     History  Chief Complaint  Patient presents with   Chest Pain    Amy Ray is a 77 y.o. female with medical history of stroke currently on Plavix, hyperlipidemia, hypertension, vertigo.  Patient presents to ED for evaluation of chest pain.  The patient reports that she was seen here on 1/6 for same complaint.  At that time the patient had unremarkable workup and was referred to cardiology.  Patient states she has appointment this Thursday.  Patient presents today complaining of chest pain that she began developing last night around 5:30 in the morning.  Patient reports that this time her heart was very loud and she was able to hear her heart in her ears.  Patient reports the chest pain was described as a burning sensation in her central chest which did not radiate.  Patient reports that she woke up and took Maalox and 45 minutes later her chest pain was reduced.  The patient reports that chest pain then returned and she decided to call EMS.  Patient reports with EMS she was given nitro and aspirin, denies nitro relieved chest pain.  Patient reports that all it did was give her headache.  Patient states that after being in the department, her chest pain is again resolved and she is now chest pain-free.  Patient states that this episode was very similar to the episode that brought her to the department on 1/6.  Patient denies any shortness of breath, nausea, vomiting, lightheadedness, dizziness, weakness, leg swelling, back pain.   Chest Pain Associated symptoms: no back pain, no dizziness, no nausea, no shortness of breath, no vomiting and no weakness        Home Medications Prior to Admission medications   Medication Sig Start Date End Date Taking? Authorizing Provider  pantoprazole (PROTONIX) 20 MG tablet Take 1 tablet (20 mg total) by mouth daily. 10/24/22  Yes  [name], PA-C  Acetaminophen (TYLENOL EX ST ARTHRITIS PAIN PO) Take 2 tablets by mouth daily as needed (for arthritis pain).    [provider]  aspirin 81 MG tablet Take 1 tablet by mouth daily.    [provider]  carbamazepine (TEGRETOL) 200 MG tablet TAKE 1 TABLET BY MOUTH  TWICE DAILY 01/07/22   Allwardt, Alyssa M, PA-C  clopidogrel (PLAVIX) 75 MG tablet Take 1 tablet (75 mg total) by mouth daily. 09/06/22   Allwardt, Randa Evens, PA-C  diltiazem (DILACOR XR) 180 MG 24 hr capsule Take 360 mg by mouth daily.    [provider]  famotidine (PEPCID) 40 MG tablet TAKE 1 TABLET BY MOUTH AT  BEDTIME 07/15/21   Birdie Sons, MD  furosemide (LASIX) 20 MG tablet Take 20 mg by mouth.    [provider]  hydrocortisone (ANUSOL-HC) 25 MG suppository Place 1 suppository (25 mg total) rectally 2 (two) times daily as needed for hemorrhoids or anal itching. For 7 days 08/03/22   Drenda Freeze, MD  ketorolac (ACULAR) 0.5 % ophthalmic solution Place 1 drop into the right eye 4 (four) times daily. 05/26/22   [provider]  nortriptyline (PAMELOR) 10 MG capsule TAKE 1 TO 2 CAPSULES BY MOUTH AT BEDTIME 07/29/22   Allwardt, Alyssa M, PA-C  polyethylene glycol (MIRALAX / GLYCOLAX) 17 g packet Take 17 g by mouth 2 (two) times daily. 08/03/22   Drenda Freeze, MD  SIMBRINZA 1-0.2 % SUSP Apply to eye. 05/27/22   [provider]  simvastatin (ZOCOR) 20 MG tablet TAKE 1 TABLET BY MOUTH DAILY 04/05/22   Allwardt, Alyssa M, PA-C  VITAMIN D PO Take by mouth.    [provider]      Allergies    Augmentin [amoxicillin-pot clavulanate], Quinapril-hydrochlorothiazide, Tape, Tramadol, and Silicone    Review of Systems   Review of Systems  Respiratory:  Negative for shortness of breath.   Cardiovascular:  Positive for chest pain. Negative for leg swelling.  Gastrointestinal:  Negative for nausea and vomiting.  Musculoskeletal:  Negative for  back pain.  Neurological:  Negative for dizziness, weakness and light-headedness.  All other systems reviewed and are negative.   Physical Exam Updated Vital Signs BP (!) 159/73   Pulse 72   Temp 98.6 F (37 C)   Resp 14   Ht 5' (1.524 m)   Wt 86.2 kg   SpO2 99%   BMI 37.11 kg/m  Physical Exam Vitals and nursing note reviewed.  Constitutional:      General: She is not in acute distress.    Appearance: Normal appearance. She is not ill-appearing, toxic-appearing or diaphoretic.  HENT:     Head: Normocephalic and atraumatic.     Nose: Nose normal. No congestion.     Mouth/Throat:     Mouth: Mucous membranes are moist.     Pharynx: Oropharynx is clear.  Eyes:     Extraocular Movements: Extraocular movements intact.     Conjunctiva/sclera: Conjunctivae normal.     Pupils: Pupils are equal, round, and reactive to light.  Cardiovascular:     Rate and Rhythm: Normal rate and regular rhythm.  Pulmonary:     Effort: Pulmonary effort is normal.     Breath sounds: Normal breath sounds. No wheezing.  Abdominal:     General: Abdomen is flat. Bowel sounds are normal.     Palpations: Abdomen is soft.     Tenderness: There is no abdominal tenderness.  Musculoskeletal:     Cervical back: Normal range of motion and neck supple. No tenderness.     Right lower leg: No edema.     Left lower leg: No edema.  Skin:    General: Skin is warm and dry.     Capillary Refill: Capillary refill takes less than 2 seconds.  Neurological:     Mental Status: She is alert and oriented to person, place, and time.     ED Results / Procedures / Treatments   Labs (all labs ordered are listed, but only abnormal results are displayed) Labs Reviewed  BASIC METABOLIC PANEL - Abnormal; Notable for the following components:      Result Value   Potassium 3.4 (*)    Glucose, Bld 101 (*)    All other components within normal limits  CBC - Abnormal; Notable for the following components:   MCV 101.0 (*)     All other components within normal limits  TROPONIN I (HIGH SENSITIVITY)  TROPONIN I (HIGH SENSITIVITY)    EKG EKG Interpretation  Date/Time:  Sunday October 24 2022 08:07:09 EST Ventricular Rate:  83 PR Interval:  144 QRS Duration: 76 QT Interval:  356 QTC Calculation: 418 R Axis:   60 Text Interpretation: Normal sinus rhythm with sinus arrhythmia Normal ECG When compared with ECG of 15-Oct-2022 23:04, No significant change since last tracing Confirmed by Meridee Score [phone]) on 10/24/2022 9:43:53 AM  Radiology DG Chest 2 View  Result  Date: 10/24/2022 CLINICAL DATA:  77 year old female with history of chest pain. EXAM: CHEST - 2 VIEW COMPARISON:  Chest x-ray 10/15/2022. FINDINGS: Lung volumes are normal. No consolidative airspace disease. No pleural effusions. No pneumothorax. No pulmonary nodule or mass noted. Pulmonary vasculature and the cardiomediastinal silhouette are within normal limits allowing for patient rotation to the right. Atherosclerosis in the thoracic aorta. Status post bilateral shoulder arthroplasty. IMPRESSION: 1. No radiographic evidence of acute cardiopulmonary disease. 2. Aortic atherosclerosis. Electronically Signed   By: Vinnie Langton M.D.   On: 10/24/2022 08:54    Procedures Procedures    Medications Ordered in ED Medications  acetaminophen (TYLENOL) tablet 650 mg (650 mg Oral Given 10/24/22 1035)    ED Course/ Medical Decision Making/ A&P                          Medical Decision Making Amount and/or Complexity of Data Reviewed Labs: ordered. Radiology: ordered.  Risk OTC drugs.   77 year old female presents to the ED for evaluation.  Please see HPI for further details.  On examination patient afebrile and nontachycardic.  Patient lung sounds clear bilaterally, she is not hypoxic on room air.  Patient abdomen soft and compressible throughout.  Patient neurological examination shows no focal neurodeficits.  Patient has no bilateral  pitting edema.  The patient has no evidence of A-fib on the monitor.  Patient workup initiated in triage includes troponin x 2, CBC, BMP, chest x-ray, EKG.  Patient chest x-ray clear with no signs of effusions or consolidations.  Mediastinum not widened.  Patient CBC unremarkable, no leukocytosis or anemia.  Patient BMP unremarkable.  Patient EKG nonischemic.  Patient provided Tylenol for headache she states began after receiving nitro.  At this time, patient workup reassuring.  Patient reports that her chest discomfort has completely resolved at present.  I suspect that this patient could be suffering from effects of GERD however she does have sinus arrhythmia on EKG.  Will advised the patient to continue with cardiology follow-up.  I will also place the patient on Protonix for possible GERD.  Patient provided return precautions and she voiced understanding with these.  The patient had all of her questions answered to her satisfaction.  The patient was encouraged to continue with follow-up to cardiology.  Patient stable for discharge.   Final Clinical Impression(s) / ED Diagnoses Final diagnoses:  Chest pain, unspecified type    Rx / DC Orders ED Discharge Orders          Ordered    pantoprazole (PROTONIX) 20 MG tablet  Daily        10/24/22 1242              [name], [place] 10/24/22 1242    Hayden Rasmussen, MD 10/24/22 1731

## 2022-10-24 NOTE — Discharge Instructions (Addendum)
Return to the ED with any new or worsening signs or symptoms Please continue with your cardiology appointment on Thursday as we discussed Please read the attached guide concerning nonspecific chest pain Please begin taking Protonix once daily for suspected GERD

## 2022-10-24 NOTE — ED Triage Notes (Signed)
Reports chest pain has continued since last weeek and has been referred to cardiologist. Descrbed as burning sensation into neck with dizziness.  ASA 324mg  nitro 1 given by EMS

## 2022-10-26 ENCOUNTER — Ambulatory Visit: Payer: Medicare Other | Admitting: Cardiology

## 2022-10-28 ENCOUNTER — Ambulatory Visit: Payer: Medicare Other | Admitting: Cardiology

## 2022-10-29 ENCOUNTER — Other Ambulatory Visit: Payer: Self-pay

## 2022-10-29 MED ORDER — FAMOTIDINE 40 MG PO TABS: 40.0000 mg | ORAL_TABLET | Freq: Every day | ORAL | 3 refills | Status: DC

## 2022-11-09 ENCOUNTER — Ambulatory Visit: Payer: Medicare Other | Admitting: Internal Medicine

## 2022-11-12 ENCOUNTER — Ambulatory Visit: Payer: Medicare Other | Admitting: Interventional Cardiology

## 2022-11-16 ENCOUNTER — Other Ambulatory Visit: Payer: Self-pay | Admitting: Physician Assistant

## 2022-11-16 NOTE — Telephone Encounter (Signed)
Please advise if appropriate to refill for 100 day supply/ or 1 year

## 2022-11-26 DIAGNOSIS — K5900 Constipation, unspecified: Secondary | ICD-10-CM | POA: Diagnosis not present

## 2022-11-26 DIAGNOSIS — K625 Hemorrhage of anus and rectum: Secondary | ICD-10-CM | POA: Diagnosis not present

## 2022-11-26 DIAGNOSIS — K648 Other hemorrhoids: Secondary | ICD-10-CM | POA: Diagnosis not present

## 2022-11-29 ENCOUNTER — Ambulatory Visit: Payer: Medicare Other | Admitting: Physician Assistant

## 2022-12-01 ENCOUNTER — Emergency Department (HOSPITAL_COMMUNITY)
Admission: EM | Admit: 2022-12-01 | Discharge: 2022-12-02 | Disposition: A | Discharge status: Home / Self Care | Payer: Medicare Other | Attending: Emergency Medicine | Admitting: Emergency Medicine

## 2022-12-01 ENCOUNTER — Other Ambulatory Visit: Payer: Self-pay

## 2022-12-01 DIAGNOSIS — Z7902 Long term (current) use of antithrombotics/antiplatelets: Secondary | ICD-10-CM | POA: Insufficient documentation

## 2022-12-01 DIAGNOSIS — R109 Unspecified abdominal pain: Secondary | ICD-10-CM | POA: Diagnosis not present

## 2022-12-01 DIAGNOSIS — I700 Atherosclerosis of aorta: Secondary | ICD-10-CM | POA: Diagnosis not present

## 2022-12-01 DIAGNOSIS — Z8673 Personal history of transient ischemic attack (TIA), and cerebral infarction without residual deficits: Secondary | ICD-10-CM | POA: Insufficient documentation

## 2022-12-01 DIAGNOSIS — R1084 Generalized abdominal pain: Secondary | ICD-10-CM | POA: Diagnosis not present

## 2022-12-01 DIAGNOSIS — R112 Nausea with vomiting, unspecified: Secondary | ICD-10-CM | POA: Diagnosis not present

## 2022-12-01 DIAGNOSIS — R197 Diarrhea, unspecified: Secondary | ICD-10-CM | POA: Insufficient documentation

## 2022-12-01 DIAGNOSIS — I10 Essential (primary) hypertension: Secondary | ICD-10-CM | POA: Diagnosis not present

## 2022-12-01 DIAGNOSIS — R5383 Other fatigue: Secondary | ICD-10-CM | POA: Insufficient documentation

## 2022-12-01 DIAGNOSIS — Z1152 Encounter for screening for COVID-19: Secondary | ICD-10-CM | POA: Diagnosis not present

## 2022-12-01 DIAGNOSIS — R600 Localized edema: Secondary | ICD-10-CM | POA: Insufficient documentation

## 2022-12-01 DIAGNOSIS — R6883 Chills (without fever): Secondary | ICD-10-CM | POA: Diagnosis not present

## 2022-12-01 DIAGNOSIS — Z7982 Long term (current) use of aspirin: Secondary | ICD-10-CM | POA: Insufficient documentation

## 2022-12-01 LAB — CBC WITH DIFFERENTIAL/PLATELET
Abs Immature Granulocytes: 0.05 10*3/uL (ref 0.00–0.07)
Basophils Absolute: 0 10*3/uL (ref 0.0–0.1)
Basophils Relative: 0 %
Eosinophils Absolute: 0.1 10*3/uL (ref 0.0–0.5)
Eosinophils Relative: 1 %
HCT: 38.5 % (ref 36.0–46.0)
Hemoglobin: 13 g/dL (ref 12.0–15.0)
Immature Granulocytes: 1 %
Lymphocytes Relative: 7 %
Lymphs Abs: 0.6 10*3/uL — ABNORMAL LOW (ref 0.7–4.0)
MCH: 34.6 pg — ABNORMAL HIGH (ref 26.0–34.0)
MCHC: 33.8 g/dL (ref 30.0–36.0)
MCV: 102.4 fL — ABNORMAL HIGH (ref 80.0–100.0)
Monocytes Absolute: 0.4 10*3/uL (ref 0.1–1.0)
Monocytes Relative: 4 %
Neutro Abs: 8.3 10*3/uL — ABNORMAL HIGH (ref 1.7–7.7)
Neutrophils Relative %: 87 %
Platelets: 214 10*3/uL (ref 150–400)
RBC: 3.76 MIL/uL — ABNORMAL LOW (ref 3.87–5.11)
RDW: 12.4 % (ref 11.5–15.5)
WBC: 9.4 10*3/uL (ref 4.0–10.5)
nRBC: 0 % (ref 0.0–0.2)

## 2022-12-01 LAB — COMPREHENSIVE METABOLIC PANEL
ALT: 114 U/L — ABNORMAL HIGH (ref 0–44)
AST: 140 U/L — ABNORMAL HIGH (ref 15–41)
Albumin: 3.4 g/dL — ABNORMAL LOW (ref 3.5–5.0)
Alkaline Phosphatase: 120 U/L (ref 38–126)
Anion gap: 8 (ref 5–15)
BUN: 12 mg/dL (ref 8–23)
CO2: 24 mmol/L (ref 22–32)
Calcium: 8.6 mg/dL — ABNORMAL LOW (ref 8.9–10.3)
Chloride: 107 mmol/L (ref 98–111)
Creatinine, Ser: 0.84 mg/dL (ref 0.44–1.00)
GFR, Estimated: >60 mL/min (ref >60)
Glucose, Bld: 89 mg/dL (ref 70–99)
Potassium: 3.1 mmol/L — ABNORMAL LOW (ref 3.5–5.1)
Sodium: 139 mmol/L (ref 135–145)
Total Bilirubin: 0.9 mg/dL (ref 0.3–1.2)
Total Protein: 7.2 g/dL (ref 6.5–8.1)

## 2022-12-01 LAB — RESP PANEL BY RT-PCR (RSV, FLU A&B, COVID)  RVPGX2
Influenza A by PCR: NEGATIVE
Influenza B by PCR: NEGATIVE
Resp Syncytial Virus by PCR: NEGATIVE
SARS Coronavirus 2 by RT PCR: NEGATIVE

## 2022-12-01 LAB — LIPASE, BLOOD: Lipase: 29 U/L (ref 11–51)

## 2022-12-01 LAB — BRAIN NATRIURETIC PEPTIDE: B Natriuretic Peptide: 73.1 pg/mL (ref 0.0–100.0)

## 2022-12-01 LAB — LACTIC ACID, PLASMA: Lactic Acid, Venous: 1.1 mmol/L (ref 0.5–1.9)

## 2022-12-01 MED ORDER — ONDANSETRON 4 MG PO TBDP
4.0000 mg | ORAL_TABLET | Freq: Once | ORAL | Status: AC | PRN
  Administered 2022-12-01: 4 mg via ORAL
  Filled 2022-12-01: qty 1

## 2022-12-01 NOTE — ED Provider Triage Note (Signed)
Emergency Medicine Provider Triage Evaluation Note  Amy Ray , a 77 y.o. female  was evaluated in triage.  Pt complains of nausea, vomiting, diarrhea, generalized bodyaches and chills.  Patient reports the symptoms began this morning upon waking.  Patient denies any known sick contacts.  Patient denies shortness of breath, chest pain, dysuria, sore throat, fevers.  Patient states she did have abdominal pain at 1 point however no longer.  Patient reports at 1 point she was nauseous however no longer.  Offered Zofran, patient defers at this time.  Review of Systems  Positive:  Negative:   Physical Exam  There were no vitals taken for this visit. Gen:   Awake, no distress   Resp:  Normal effort  MSK:   Moves extremities without difficulty  Other:  All 4 quadrants of abdomen soft and compressible  Medical Decision Making  Medically screening exam initiated at 3:13 PM.  Appropriate orders placed.  Amy Ray was informed that the remainder of the evaluation will be completed by another provider, this initial triage assessment does not replace that evaluation, and the importance of remaining in the ED until their evaluation is complete.     [name], PA-C 12/01/22 1514

## 2022-12-01 NOTE — ED Triage Notes (Signed)
Pt from home for eval of n/v/d. Woke up this morning sneezing then developed headache, body aches, chills and n/v/d. 2 episodes of each today. Was having associated abdominal pain but has subsided at 1400 today.

## 2022-12-01 NOTE — ED Provider Notes (Signed)
[facility] Provider Note   CSN: [id number] Arrival date & time: 12/01/22  1446     History {Add pertinent medical, surgical, social history, OB history to HPI:1} Chief Complaint  Patient presents with   Emesis   Diarrhea    Akeilah Dillahunt is a 77 y.o. female.  The history is provided by the patient, a relative and medical records. No language interpreter was used.  Emesis Severity:  Moderate Duration:  1 day Timing:  Intermittent Progression:  Unchanged Chronicity:  New Recent urination:  Normal Associated symptoms: abdominal pain, chills and diarrhea   Associated symptoms: no cough and no fever   Diarrhea Quality:  Watery Severity:  Moderate Associated symptoms: abdominal pain, chills and vomiting   Associated symptoms: no fever   Abdominal Pain Pain location:  Generalized Pain quality: aching   Pain radiates to:  Does not radiate Pain severity:  Moderate Onset quality:  Gradual Duration:  1 day Timing:  Constant Progression:  Waxing and waning Chronicity:  Recurrent Relieved by:  Bowel activity Worsened by:  Nothing Associated symptoms: chills, diarrhea, fatigue, nausea and vomiting   Associated symptoms: no chest pain, no constipation, no cough, no dysuria, no fever and no shortness of breath        Home Medications Prior to Admission medications   Medication Sig Start Date End Date Taking? Authorizing Provider  Acetaminophen (TYLENOL EX ST ARTHRITIS PAIN PO) Take 2 tablets by mouth daily as needed (for arthritis pain).    [provider]  aspirin 81 MG tablet Take 1 tablet by mouth daily.    [provider]  carbamazepine (TEGRETOL) 200 MG tablet TAKE 1 TABLET BY MOUTH TWICE  DAILY 11/16/22   Allwardt, Randa Evens, PA-C  clopidogrel (PLAVIX) 75 MG tablet Take 1 tablet (75 mg total) by mouth daily. 09/06/22   Allwardt, Randa Evens, PA-C  diltiazem (DILACOR XR) 180 MG 24 hr capsule Take 360 mg by mouth  daily.    [provider]  famotidine (PEPCID) 40 MG tablet Take 1 tablet (40 mg total) by mouth at bedtime. 10/29/22   Allwardt, Randa Evens, PA-C  furosemide (LASIX) 20 MG tablet Take 20 mg by mouth.    [provider]  hydrocortisone (ANUSOL-HC) 25 MG suppository Place 1 suppository (25 mg total) rectally 2 (two) times daily as needed for hemorrhoids or anal itching. For 7 days 08/03/22   Drenda Freeze, MD  ketorolac (ACULAR) 0.5 % ophthalmic solution Place 1 drop into the right eye 4 (four) times daily. 05/26/22   [provider]  nortriptyline (PAMELOR) 10 MG capsule TAKE 1 TO 2 CAPSULES BY MOUTH AT BEDTIME 07/29/22   Allwardt, Alyssa M, PA-C  pantoprazole (PROTONIX) 20 MG tablet Take 1 tablet (20 mg total) by mouth daily. 10/24/22   [name], PA-C  polyethylene glycol (MIRALAX / GLYCOLAX) 17 g packet Take 17 g by mouth 2 (two) times daily. 08/03/22   Drenda Freeze, MD  SIMBRINZA 1-0.2 % SUSP Apply to eye. 05/27/22   [provider]  simvastatin (ZOCOR) 20 MG tablet TAKE 1 TABLET BY MOUTH DAILY 04/05/22   Allwardt, Alyssa M, PA-C  VITAMIN D PO Take by mouth.    [provider]      Allergies    Augmentin [amoxicillin-pot clavulanate], Quinapril-hydrochlorothiazide, Tape, Tramadol, and Silicone    Review of Systems   Review of Systems  Constitutional:  Positive for chills and fatigue. Negative for fever.  HENT:  Negative for congestion.   Respiratory:  Negative for cough, chest tightness, shortness of breath and wheezing.   Cardiovascular:  Negative for chest pain, palpitations and leg swelling.  Gastrointestinal:  Positive for abdominal pain, diarrhea, nausea and vomiting. Negative for constipation.  Genitourinary:  Negative for dysuria and flank pain.  Musculoskeletal:  Negative for back pain.  Skin:  Negative for rash.  Neurological:  Negative for weakness, light-headedness and numbness.  Psychiatric/Behavioral:  Negative  for agitation.   All other systems reviewed and are negative.   Physical Exam Updated Vital Signs BP (!) 152/93 (BP Location: Left Arm)   Pulse 91   Temp 99.3 F (37.4 C) (Oral)   Resp 17   SpO2 100%  Physical Exam Vitals and nursing note reviewed.  Constitutional:      General: She is not in acute distress.    Appearance: She is well-developed. She is not ill-appearing, toxic-appearing or diaphoretic.  HENT:     Head: Normocephalic and atraumatic.     Nose: Nose normal.     Mouth/Throat:     Mouth: Mucous membranes are moist.     Pharynx: No oropharyngeal exudate or posterior oropharyngeal erythema.  Eyes:     Extraocular Movements: Extraocular movements intact.     Conjunctiva/sclera: Conjunctivae normal.     Pupils: Pupils are equal, round, and reactive to light.  Cardiovascular:     Rate and Rhythm: Normal rate and regular rhythm.     Heart sounds: No murmur heard. Pulmonary:     Effort: Pulmonary effort is normal. No respiratory distress.     Breath sounds: Normal breath sounds. No wheezing, rhonchi or rales.  Chest:     Chest wall: No tenderness.  Abdominal:     General: Abdomen is flat. There is no distension.     Palpations: Abdomen is soft.     Tenderness: There is no abdominal tenderness. There is no right CVA tenderness, left CVA tenderness, guarding or rebound.  Musculoskeletal:        General: No swelling or tenderness.     Cervical back: Neck supple.     Right lower leg: Edema present.     Left lower leg: Edema present.  Skin:    General: Skin is warm and dry.     Capillary Refill: Capillary refill takes less than 2 seconds.     Findings: No erythema or rash.  Neurological:     General: No focal deficit present.     Mental Status: She is alert.     Sensory: No sensory deficit.     Motor: No weakness.  Psychiatric:        Mood and Affect: Mood normal.     ED Results / Procedures / Treatments   Labs (all labs ordered are listed, but only  abnormal results are displayed) Labs Reviewed  CBC WITH DIFFERENTIAL/PLATELET - Abnormal; Notable for the following components:      Result Value   RBC 3.76 (*)    MCV 102.4 (*)    MCH 34.6 (*)    Neutro Abs 8.3 (*)    Lymphs Abs 0.6 (*)    All other components within normal limits  RESP PANEL BY RT-PCR (RSV, FLU A&B, COVID)  RVPGX2  URINALYSIS, ROUTINE W REFLEX MICROSCOPIC  COMPREHENSIVE METABOLIC PANEL  LIPASE, BLOOD  LACTIC ACID, PLASMA  LACTIC ACID, PLASMA  BRAIN NATRIURETIC PEPTIDE  I-STAT CHEM 8, ED    EKG None  Radiology No results found.  Procedures  Procedures  {Document cardiac monitor, telemetry assessment procedure when appropriate:1}  Medications Ordered in ED Medications  ondansetron (ZOFRAN-ODT) disintegrating tablet 4 mg (4 mg Oral Given 12/01/22 2006)    ED Course/ Medical Decision Making/ A&P   {   Click here for ABCD2, HEART and other calculatorsREFRESH Note before signing :1}                          Medical Decision Making Amount and/or Complexity of Data Reviewed Labs: ordered. Radiology: ordered.  Risk Prescription drug management.    Breslynn Burdette is a 77 y.o. female with a past medical history significant for hypertension, hyperlipidemia, obesity, previous stroke, diverticulosis, TIAs, vertigo, and previous cholecystectomy who presents with 1 day of fevers, chills, malaise, nausea, vomiting, diarrhea, body aches, sneezing, and mild headache.  According to patient, she was having moderate abdominal discomfort that has waxed and waned.  She says that this is the third visit over the last month she has had similar symptoms.  She denies sick contacts.  On arrival temperature was 99.3 orally.  She denies any sick contacts.  She was given some Zofran and the nausea has improved but is still feeling ill.  Still having some intermittent abdominal discomfort.  She is not any chest pain, shortness breath, or palpitations.  Currently is not reporting  acute headache or neck pain.  On exam, lungs clear.  Chest nontender.  No rales on exam.  She does have edema in both legs that she reports is gradually been worsening.  Abdomen is nontender but she is describing some discomfort intermittently.  Normal bowel sounds.  Pulses intact in extremities.  No evidence of acute trauma.    Of note, patient has been in the emergency department for approximately 7 hours prior to my evaluation.  Clinically given the patient's describing abdominal pain, fevers, nausea vomiting, diarrhea, and history of diverticulosis, I am somewhat concerned about diverticulitis versus other viral infection.  She had some labs done in triage before being bedded.  Her viral testing was negative for COVID/flu/RSV we discussed the possibility of either false negative versus other viral gastroenteritis.  Patient does want to get the rest of the labs as well as CT imaging to rule out something they will need antibiotics such as diverticulitis.  Given the edema, will hold on fluids.  Anticipate reassessment after CT and other labs.     {Document critical care time when appropriate:1} {Document review of labs and clinical decision tools ie heart score, Chads2Vasc2 etc:1}  {Document your independent review of radiology images, and any outside records:1} {Document your discussion with family members, caretakers, and with consultants:1} {Document social determinants of health affecting pt's care:1} {Document your decision making why or why not admission, treatments were needed:1} Final Clinical Impression(s) / ED Diagnoses Final diagnoses:  None    Rx / DC Orders ED Discharge Orders     None

## 2022-12-02 ENCOUNTER — Emergency Department (HOSPITAL_COMMUNITY): Payer: Medicare Other

## 2022-12-02 DIAGNOSIS — I700 Atherosclerosis of aorta: Secondary | ICD-10-CM | POA: Diagnosis not present

## 2022-12-02 DIAGNOSIS — R109 Unspecified abdominal pain: Secondary | ICD-10-CM | POA: Diagnosis not present

## 2022-12-02 MED ORDER — IOHEXOL 350 MG/ML SOLN
75.0000 mL | Freq: Once | INTRAVENOUS | Status: AC | PRN
  Administered 2022-12-02: 75 mL via INTRAVENOUS

## 2022-12-02 MED ORDER — HYOSCYAMINE SULFATE SL 0.125 MG SL SUBL: 1.0000 | SUBLINGUAL_TABLET | Freq: Three times a day (TID) | SUBLINGUAL | 0 refills | Status: DC | PRN

## 2022-12-02 MED ORDER — ONDANSETRON 4 MG PO TBDP: 4.0000 mg | ORAL_TABLET | Freq: Three times a day (TID) | ORAL | 0 refills | Status: AC | PRN

## 2022-12-02 NOTE — Discharge Instructions (Addendum)
During the workup we noted incidental findings on your imaging that would require you to follow-up with your regular doctor for further evaluation/management:  3. Thickened endometrium, new since the prior CT. Findings  concerning for underlying endometrial neoplasm or cervical  stricture. Gynecology referral and further evaluation with  hysteroscopy is recommended.

## 2022-12-02 NOTE — ED Provider Notes (Signed)
I assumed care of this patient.  Please see previous provider note for further details of Hx, PE.  Briefly patient is a 77 y.o. female who presented with n/v/d and abd discomfort. Work up reassuring, pending CT.  CT negative for any serious intra-abdominal inflammatory/infectious process.  Incidentally there was evidence of thickened endometrium.  Patient was made aware and instructed to follow-up with gynecology.  The patient appears reasonably screened and/or stabilized for discharge and I doubt any other medical condition or other [facility] requiring further screening, evaluation, or treatment in the ED at this time. I have discussed the findings, Dx and Tx plan with the patient/family who expressed understanding and agree(s) with the plan. Discharge instructions discussed at length. The patient/family was given strict return precautions who verbalized understanding of the instructions. No further questions at time of discharge.  Disposition: Discharge  Condition: Good  ED Discharge Orders          Ordered    ondansetron (ZOFRAN-ODT) 4 MG disintegrating tablet  Every 8 hours PRN        12/02/22 0228    Hyoscyamine Sulfate SL (LEVSIN/SL) 0.125 MG SUBL  3 times daily PRN        12/02/22 0228             Follow Up: Allwardt, Randa Evens, PA-C [address] [place] [address] [phone]  Call  to schedule an appointment for close follow up         Brynnlie Unterreiner, Grayce Sessions, MD 12/02/22 [phone]

## 2022-12-03 ENCOUNTER — Ambulatory Visit: Payer: Medicare Other | Admitting: Cardiology

## 2022-12-10 DIAGNOSIS — Z6838 Body mass index (BMI) 38.0-38.9, adult: Secondary | ICD-10-CM | POA: Diagnosis not present

## 2022-12-10 DIAGNOSIS — N950 Postmenopausal bleeding: Secondary | ICD-10-CM | POA: Diagnosis not present

## 2022-12-10 DIAGNOSIS — Z1151 Encounter for screening for human papillomavirus (HPV): Secondary | ICD-10-CM | POA: Diagnosis not present

## 2022-12-10 DIAGNOSIS — Z124 Encounter for screening for malignant neoplasm of cervix: Secondary | ICD-10-CM | POA: Diagnosis not present

## 2022-12-14 ENCOUNTER — Telehealth: Payer: Self-pay | Admitting: Physician Assistant

## 2022-12-14 NOTE — Telephone Encounter (Signed)
Patient states: - Having hysteroscopy on 12/20/22.  - Needs to know if could stop plavix prior to surgery.   Please Advise.

## 2022-12-15 NOTE — Telephone Encounter (Signed)
Called [facility] (Dr Mardelle Matte) office and advised no fax or call received regarding stopping any medications prior. Advised fax number to nurse to send if anything is needed. Pt has advised me this morning she has stopped taking Plavix just in case. Awiting fax or call back from Dr Luanna Cole nurse.

## 2022-12-17 ENCOUNTER — Encounter: Payer: Self-pay | Admitting: Physician Assistant

## 2022-12-17 ENCOUNTER — Encounter (HOSPITAL_BASED_OUTPATIENT_CLINIC_OR_DEPARTMENT_OTHER): Payer: Self-pay | Admitting: Obstetrics and Gynecology

## 2022-12-17 ENCOUNTER — Ambulatory Visit (INDEPENDENT_AMBULATORY_CARE_PROVIDER_SITE_OTHER): Payer: Medicare HMO | Admitting: Physician Assistant

## 2022-12-17 VITALS — BP 133/77 | HR 88 | Temp 97.7°F | Ht 60.0 in | Wt 200.4 lb

## 2022-12-17 DIAGNOSIS — R7989 Other specified abnormal findings of blood chemistry: Secondary | ICD-10-CM

## 2022-12-17 DIAGNOSIS — R9389 Abnormal findings on diagnostic imaging of other specified body structures: Secondary | ICD-10-CM | POA: Diagnosis not present

## 2022-12-17 DIAGNOSIS — E876 Hypokalemia: Secondary | ICD-10-CM

## 2022-12-17 LAB — COMPREHENSIVE METABOLIC PANEL
ALT: 28 U/L (ref 0–35)
AST: 34 U/L (ref 0–37)
Albumin: 3.6 g/dL (ref 3.5–5.2)
Alkaline Phosphatase: 112 U/L (ref 39–117)
BUN: 11 mg/dL (ref 6–23)
CO2: 27 mEq/L (ref 19–32)
Calcium: 9 mg/dL (ref 8.4–10.5)
Chloride: 105 mEq/L (ref 96–112)
Creatinine, Ser: 0.96 mg/dL (ref 0.40–1.20)
GFR: 57.83 mL/min — ABNORMAL LOW (ref >60.00)
Glucose, Bld: 90 mg/dL (ref 70–99)
Potassium: 4.2 mEq/L (ref 3.5–5.1)
Sodium: 141 mEq/L (ref 135–145)
Total Bilirubin: 0.4 mg/dL (ref 0.2–1.2)
Total Protein: 7 g/dL (ref 6.0–8.3)

## 2022-12-17 NOTE — Progress Notes (Signed)
Spoke w/ via phone for pre-op interview--- pt Lab needs dos----  cbc, t&s, istat             Lab results------ current EKG in epic/ chart COVID test -----patient states asymptomatic no test needed Arrive at ------- 1000 on 12-20-2022 NPO after MN NO Solid Food.  Clear liquids from MN until--- 0900 Med rec completed Medications to take morning of surgery ----- tegretol, zocor, diltiazem, eye drops as usual Diabetic medication ----- n/a Patient instructed no nail polish to be worn day of surgery Patient instructed to bring photo id and insurance card day of surgery Patient aware to have Driver (ride ) / caregiver    for 24 hours after surgery -- daughter, darlene Patient Special Instructions ----- pt verbalized understanding to not take lasix , asa , plavix morning of surgery and understood per Dr Mardelle Matte in his H&P in epic pt was to continue asa/ plavix and not stop prior to surgery. Pre-Op special Istructions -----  pt was able to understand her medication when she takes even though dx with mild cognitive impairment but some other medical history was unsure of but other ok. Patient verbalized understanding of instructions that were given at this phone interview. Patient denies shortness of breath, chest pain, fever, cough at this phone interview.

## 2022-12-17 NOTE — H&P (Signed)
Gynecology History and Physical Preadmission H&P for scheduled procedure.  Amy Ray is a 77 y.o. female G4P4 presenting for scheduled IUD removal, D&C, possible hysteroscopy for postmenopausal bleeding, thickened endometrium.  Patient reports menopause at age 40, but has been bleeding vaginally since 05/2022.  Bleeding has been light, similar to menses.  She has new onset pelvic/abdominal pain that was evaluated with multiple ED visits with suspected source of diverticulitis.  This pain was resolved, however CT scan revealed interval increase in endometrial thickness from prior scan to a thickness of 3.7 cm (CT AP 12/02/2022).  An IUD is also present, which she states was placed in the 1970s.  She was seen as referral for this problem. On exam, very little bleeding noted.  IUD strings were present as what was evident to be the lower part of the IUD in the cervical os.  Pap was collected and resulted as NILM.  She also reports increased vaginal discharge. She has never been on hormone replacement therapy.  She has a relevant history of stroke and is currently on aspirin/plavix.   OB History     Gravida  4   Para  4   Term      Preterm      AB      Living         SAB      IAB      Ectopic      Multiple      Live Births             Past Medical History:  Diagnosis Date   History of diverticulitis of colon 05/20/2015   per patient report    Hyperlipidemia    Hypertension    Stroke ([place]) 03/16/2005   TIA (transient ischemic attack)    Trigeminal neuralgia    Vertigo    Past Surgical History:  Procedure Laterality Date   CHOLECYSTECTOMY  2000   HERNIA REPAIR     umbilical hernia as a child   Pseudophakia OS Left 02/12/2020   Dr. Ellin Mayhew   REPLACEMENT TOTAL KNEE Bilateral    SHOULDER SURGERY Left    Family History: family history includes Alzheimer's disease in her father; Breast cancer in her maternal aunt; Breast cancer (age of onset: 26) in her  sister; Breast cancer (age of onset: 72) in her maternal aunt; Diabetes in her brother and father; Heart attack in her mother; Hypertension in her mother, sister, sister, sister, sister, and sister; Stroke in her mother. Social History:  reports that she has never smoked. She has never used smokeless tobacco. She reports that she does not drink alcohol and does not use drugs.   Review of Systems - Patient denies fever, chills, SOB, CP, N/V/D.  History   There were no vitals taken for this visit. Exam Physical Exam   Gen: alert, well appearing, no distress Chest: nonlabored breathing CV: no peripheral edema Abdomen: soft, nontender Ext: no evidence of DVT    Assessment/Plan: Admit for planned procedure. Discussed postmenopausal bleeding in the setting of thickened endometrium.  Discussed importance of endometrial sampling.  She was unable to tolerate pipelle sampling for endometrial biopsy in the office and would prefer sampling under anesthesia. Given possible bleeding component from malpositioned/embedded IUD with growing endometrial lining, she was scheduled for IUD removal, dilation and curettage with possible hysteroscopy. There was no pyometrium on exam, thus do not immediately suspect active infection. However, if any signs arise or are evident during D&C, would forgo hysteroscopy.  Most recent WBC 9.4 Most recent HGB 13.0. Planned procedure is of low bleeding risk and will plan to continue her prescribed aspirin and plavix except for day-of procedure.   [name] 12/17/2022, 6:51 AM

## 2022-12-17 NOTE — Progress Notes (Unsigned)
Subjective:    Patient ID: Amy Ray, female    DOB: 1945-11-25, 77 y.o.   MRN: [id number]  Chief Complaint  Patient presents with   Hospitalization Follow-up    Pt has no questions or concerns today     HPI Patient is in today for ED f/up 12/01/22. She is here with her daughter today.   Says she had stomach flu symptoms, but went into ED because of the moderate abdominal pain she was experiencing.  States she is feeling back to her baseline today.   She has hysteroscopy & D/C on 12/20/22.   Will be using [facility] pharmacy mail order from now on.   Past Medical History:  Diagnosis Date   Alzheimers disease ([place])    Anticoagulant long-term use    plavix   Chronic constipation    Diverticulosis of colon    Generalized abdominal pain    Glaucoma, both eyes    Hemorrhoids    History of CVA with residual deficit 03/16/2005   left facial paresthesia   History of diverticulitis of colon 05/20/2015   History of lower GI bleeding    Hyperlipidemia, mixed    Hypertension    per previous had nuclear stress test w/ cardiologist w/ dr Clayborn Bigness , results in care everywhere , NUC 02-01-2017  normal , ef 60%;  echo 02-02-2017 ef 50% mild AR/TR; and normal stress echo   MCI (mild cognitive impairment)    neurology--- Sharene Butters PA ([facility]);  per note seconardy to vascular and alzeimer disease;  per MRI moderate to extensive CSV ischemia   OA (osteoarthritis)    PMB (postmenopausal bleeding)    Thickened endometrium    Trigeminal neuralgia 2005   vertigo   Vertigo     Past Surgical History:  Procedure Laterality Date   CHOLECYSTECTOMY  2000   Pseudophakia OS Left 02/12/2020   Dr. Ellin Mayhew   REVERSE SHOULDER ARTHROPLASTY Right 06/25/2022   SHOULDER SURGERY Left    TOTAL KNEE ARTHROPLASTY Bilateral    UMBILICAL HERNIA REPAIR     child    Family History  Problem Relation Age of Onset   Stroke Mother    Heart attack Mother    Hypertension Mother    Alzheimer's  disease Father    Diabetes Father    Hypertension Sister    Hypertension Sister    Hypertension Sister    Hypertension Sister    Hypertension Sister    Breast cancer Sister 24   Diabetes Brother    Breast cancer Maternal Aunt 70   Breast cancer Maternal Aunt     Social History   Tobacco Use   Smoking status: Never   Smokeless tobacco: Never  Vaping Use   Vaping Use: Never used  Substance Use Topics   Alcohol use: No    Alcohol/week: 0.0 standard drinks of alcohol   Drug use: No     Allergies  Allergen Reactions   Augmentin [Amoxicillin-Pot Clavulanate] Hives   Quinapril-Hydrochlorothiazide Other (See Comments)    Other reaction(s): Headache     Tape     Other reaction(s): Not available   Tramadol     Dizzy, headache, nosebleed, near-syncope   Silicone Rash    Review of Systems NEGATIVE UNLESS OTHERWISE INDICATED IN HPI      Objective:     BP 133/77 (BP Location: Left Arm, Patient Position: Sitting)   Pulse 88   Temp 97.7 F (36.5 C) (Temporal)   Ht 5' (1.524 m)  Wt 200 lb 6.4 oz (90.9 kg)   SpO2 100%   BMI 39.14 kg/m   Wt Readings from Last 3 Encounters:  12/17/22 200 lb 6.4 oz (90.9 kg)  10/24/22 190 lb (86.2 kg)  10/16/22 190 lb (86.2 kg)    BP Readings from Last 3 Encounters:  12/17/22 133/77  12/02/22 (!) 146/74  10/24/22 (!) 159/73     Physical Exam     Assessment & Plan:  There are no diagnoses linked to this encounter.      No follow-ups on file.  This note was prepared with assistance of Systems analyst. Occasional wrong-word or sound-a-like substitutions may have occurred due to the inherent limitations of voice recognition software.  Time Spent: *** minutes of total time was spent on the date of the encounter performing the following actions: chart review prior to seeing the patient, obtaining history, performing a medically necessary exam, counseling on the treatment plan, placing orders, and  documenting in our EHR.       Leontina Skidmore M Raygan Skarda, PA-C

## 2022-12-20 ENCOUNTER — Encounter (HOSPITAL_BASED_OUTPATIENT_CLINIC_OR_DEPARTMENT_OTHER)
Admission: RE | Disposition: A | Discharge status: Home / Self Care | Payer: Self-pay | Source: Home / Self Care | Attending: Obstetrics and Gynecology

## 2022-12-20 ENCOUNTER — Ambulatory Visit (HOSPITAL_BASED_OUTPATIENT_CLINIC_OR_DEPARTMENT_OTHER): Payer: Medicare HMO | Admitting: Anesthesiology

## 2022-12-20 ENCOUNTER — Ambulatory Visit (HOSPITAL_BASED_OUTPATIENT_CLINIC_OR_DEPARTMENT_OTHER)
Admission: RE | Admit: 2022-12-20 | Discharge: 2022-12-20 | Disposition: A | Discharge status: Home / Self Care | Payer: Medicare HMO | Attending: Obstetrics and Gynecology | Admitting: Obstetrics and Gynecology

## 2022-12-20 ENCOUNTER — Other Ambulatory Visit: Payer: Self-pay

## 2022-12-20 ENCOUNTER — Ambulatory Visit (HOSPITAL_COMMUNITY): Payer: Medicare HMO

## 2022-12-20 ENCOUNTER — Encounter (HOSPITAL_BASED_OUTPATIENT_CLINIC_OR_DEPARTMENT_OTHER): Payer: Self-pay | Admitting: Obstetrics and Gynecology

## 2022-12-20 DIAGNOSIS — R9389 Abnormal findings on diagnostic imaging of other specified body structures: Secondary | ICD-10-CM | POA: Insufficient documentation

## 2022-12-20 DIAGNOSIS — N719 Inflammatory disease of uterus, unspecified: Secondary | ICD-10-CM | POA: Diagnosis not present

## 2022-12-20 DIAGNOSIS — Z6839 Body mass index (BMI) 39.0-39.9, adult: Secondary | ICD-10-CM | POA: Insufficient documentation

## 2022-12-20 DIAGNOSIS — A4289 Other forms of actinomycosis: Secondary | ICD-10-CM | POA: Diagnosis not present

## 2022-12-20 DIAGNOSIS — N950 Postmenopausal bleeding: Secondary | ICD-10-CM | POA: Diagnosis not present

## 2022-12-20 DIAGNOSIS — I10 Essential (primary) hypertension: Secondary | ICD-10-CM | POA: Insufficient documentation

## 2022-12-20 DIAGNOSIS — Z30431 Encounter for routine checking of intrauterine contraceptive device: Secondary | ICD-10-CM | POA: Diagnosis not present

## 2022-12-20 DIAGNOSIS — Z01818 Encounter for other preprocedural examination: Secondary | ICD-10-CM

## 2022-12-20 DIAGNOSIS — E785 Hyperlipidemia, unspecified: Secondary | ICD-10-CM | POA: Insufficient documentation

## 2022-12-20 DIAGNOSIS — N939 Abnormal uterine and vaginal bleeding, unspecified: Secondary | ICD-10-CM | POA: Diagnosis not present

## 2022-12-20 DIAGNOSIS — N72 Inflammatory disease of cervix uteri: Secondary | ICD-10-CM | POA: Insufficient documentation

## 2022-12-20 DIAGNOSIS — N710 Acute inflammatory disease of uterus: Secondary | ICD-10-CM | POA: Diagnosis not present

## 2022-12-20 HISTORY — PX: HYSTEROSCOPY WITH D & C: SHX1775

## 2022-12-20 HISTORY — DX: Unspecified glaucoma: H40.9

## 2022-12-20 HISTORY — DX: Dementia in other diseases classified elsewhere, unspecified severity, without behavioral disturbance, psychotic disturbance, mood disturbance, and anxiety: F02.80

## 2022-12-20 HISTORY — DX: Long term (current) use of anticoagulants: Z79.01

## 2022-12-20 HISTORY — DX: Trigeminal neuralgia: G50.0

## 2022-12-20 HISTORY — DX: Presence of dental prosthetic device (complete) (partial): Z97.2

## 2022-12-20 HISTORY — DX: Other constipation: K59.09

## 2022-12-20 HISTORY — DX: Unspecified hemorrhoids: K64.9

## 2022-12-20 HISTORY — DX: Nocturia: R35.1

## 2022-12-20 HISTORY — DX: Unspecified osteoarthritis, unspecified site: M19.90

## 2022-12-20 HISTORY — DX: Personal history of other diseases of the digestive system: Z87.19

## 2022-12-20 HISTORY — DX: Abnormal findings on diagnostic imaging of other specified body structures: R93.89

## 2022-12-20 HISTORY — DX: Diverticulosis of large intestine without perforation or abscess without bleeding: K57.30

## 2022-12-20 HISTORY — DX: Mixed hyperlipidemia: E78.2

## 2022-12-20 HISTORY — DX: Postmenopausal bleeding: N95.0

## 2022-12-20 HISTORY — DX: Generalized abdominal pain: R10.84

## 2022-12-20 HISTORY — DX: Mild cognitive impairment of uncertain or unknown etiology: G31.84

## 2022-12-20 LAB — TYPE AND SCREEN
ABO/RH(D): A POS
Antibody Screen: NEGATIVE

## 2022-12-20 LAB — POCT I-STAT, CHEM 8
BUN: 15 mg/dL (ref 8–23)
Calcium, Ion: 1.01 mmol/L — ABNORMAL LOW (ref 1.15–1.40)
Chloride: 110 mmol/L (ref 98–111)
Creatinine, Ser: 0.9 mg/dL (ref 0.44–1.00)
Glucose, Bld: 90 mg/dL (ref 70–99)
HCT: 29 % — ABNORMAL LOW (ref 36.0–46.0)
Hemoglobin: 9.9 g/dL — ABNORMAL LOW (ref 12.0–15.0)
Potassium: 5.4 mmol/L — ABNORMAL HIGH (ref 3.5–5.1)
Sodium: 138 mmol/L (ref 135–145)
TCO2: 23 mmol/L (ref 22–32)

## 2022-12-20 LAB — CBC
HCT: 30.7 % — ABNORMAL LOW (ref 36.0–46.0)
Hemoglobin: 10 g/dL — ABNORMAL LOW (ref 12.0–15.0)
MCH: 33.6 pg (ref 26.0–34.0)
MCHC: 32.6 g/dL (ref 30.0–36.0)
MCV: 103 fL — ABNORMAL HIGH (ref 80.0–100.0)
Platelets: 237 10*3/uL (ref 150–400)
RBC: 2.98 MIL/uL — ABNORMAL LOW (ref 3.87–5.11)
RDW: 12.2 % (ref 11.5–15.5)
WBC: 7.3 10*3/uL (ref 4.0–10.5)
nRBC: 0 % (ref 0.0–0.2)

## 2022-12-20 SURGERY — DILATATION AND CURETTAGE /HYSTEROSCOPY
Anesthesia: General

## 2022-12-20 MED ORDER — GENTAMICIN SULFATE 40 MG/ML IJ SOLN
200.0000 mg | Freq: Once | INTRAVENOUS | Status: AC
  Administered 2022-12-20: 200 mg via INTRAVENOUS
  Filled 2022-12-20: qty 5

## 2022-12-20 MED ORDER — LACTATED RINGERS IV SOLN: INTRAVENOUS | Status: DC

## 2022-12-20 MED ORDER — SODIUM CHLORIDE 0.9 % IR SOLN
Status: DC | PRN
  Administered 2022-12-20: 3000 mL

## 2022-12-20 MED ORDER — CLINDAMYCIN PHOSPHATE 900 MG/50ML IV SOLN
INTRAVENOUS | Status: AC
  Filled 2022-12-20: qty 50

## 2022-12-20 MED ORDER — ONDANSETRON HCL 4 MG/2ML IJ SOLN
INTRAMUSCULAR | Status: AC
  Filled 2022-12-20: qty 2

## 2022-12-20 MED ORDER — FENTANYL CITRATE (PF) 100 MCG/2ML IJ SOLN
INTRAMUSCULAR | Status: DC | PRN
  Administered 2022-12-20: 50 ug via INTRAVENOUS
  Administered 2022-12-20 (×2): 25 ug via INTRAVENOUS

## 2022-12-20 MED ORDER — DEXAMETHASONE SODIUM PHOSPHATE 4 MG/ML IJ SOLN
INTRAMUSCULAR | Status: DC | PRN
  Administered 2022-12-20: 5 mg via INTRAVENOUS

## 2022-12-20 MED ORDER — ONDANSETRON HCL 4 MG/2ML IJ SOLN
INTRAMUSCULAR | Status: DC | PRN
  Administered 2022-12-20: 4 mg via INTRAVENOUS

## 2022-12-20 MED ORDER — METRONIDAZOLE 500 MG PO TABS: 500.0000 mg | ORAL_TABLET | Freq: Two times a day (BID) | ORAL | 0 refills | Status: AC

## 2022-12-20 MED ORDER — CLINDAMYCIN PHOSPHATE 900 MG/50ML IV SOLN
900.0000 mg | Freq: Once | INTRAVENOUS | Status: AC
  Administered 2022-12-20: 900 mg via INTRAVENOUS

## 2022-12-20 MED ORDER — FENTANYL CITRATE (PF) 100 MCG/2ML IJ SOLN
INTRAMUSCULAR | Status: AC
  Filled 2022-12-20: qty 2

## 2022-12-20 MED ORDER — PROPOFOL 10 MG/ML IV BOLUS
INTRAVENOUS | Status: AC
  Filled 2022-12-20: qty 20

## 2022-12-20 MED ORDER — ACETAMINOPHEN 500 MG PO TABS
ORAL_TABLET | ORAL | Status: AC
  Filled 2022-12-20: qty 2

## 2022-12-20 MED ORDER — FENTANYL CITRATE (PF) 100 MCG/2ML IJ SOLN: 25.0000 ug | INTRAMUSCULAR | Status: DC | PRN

## 2022-12-20 MED ORDER — POVIDONE-IODINE 10 % EX SWAB: 2.0000 | Freq: Once | CUTANEOUS | Status: DC

## 2022-12-20 MED ORDER — PROPOFOL 10 MG/ML IV BOLUS
INTRAVENOUS | Status: DC | PRN
  Administered 2022-12-20: 50 mg via INTRAVENOUS
  Administered 2022-12-20: 100 mg via INTRAVENOUS

## 2022-12-20 MED ORDER — LIDOCAINE HCL (CARDIAC) PF 100 MG/5ML IV SOSY
PREFILLED_SYRINGE | INTRAVENOUS | Status: DC | PRN
  Administered 2022-12-20: 100 mg via INTRAVENOUS

## 2022-12-20 MED ORDER — PROMETHAZINE HCL 25 MG/ML IJ SOLN: 6.2500 mg | INTRAMUSCULAR | Status: DC | PRN

## 2022-12-20 MED ORDER — DOXYCYCLINE MONOHYDRATE 50 MG PO TABS: 100.0000 mg | ORAL_TABLET | Freq: Two times a day (BID) | ORAL | 0 refills | Status: AC

## 2022-12-20 MED ORDER — LIDOCAINE HCL (PF) 2 % IJ SOLN
INTRAMUSCULAR | Status: AC
  Filled 2022-12-20: qty 5

## 2022-12-20 MED ORDER — AMISULPRIDE (ANTIEMETIC) 5 MG/2ML IV SOLN: 10.0000 mg | Freq: Once | INTRAVENOUS | Status: DC | PRN

## 2022-12-20 MED ORDER — ACETAMINOPHEN 500 MG PO TABS
1000.0000 mg | ORAL_TABLET | Freq: Once | ORAL | Status: AC
  Administered 2022-12-20: 1000 mg via ORAL

## 2022-12-20 SURGICAL SUPPLY — 16 items
GLOVE BIO SURGEON STRL SZ8 (GLOVE) ×1 IMPLANT
GLOVE BIOGEL PI IND STRL 6.5 (GLOVE) IMPLANT
GLOVE SURG SS PI 6.5 STRL IVOR (GLOVE) IMPLANT
GOWN STRL REUS W/ TWL LRG LVL3 (GOWN DISPOSABLE) IMPLANT
GOWN STRL REUS W/TWL LRG LVL3 (GOWN DISPOSABLE) ×2 IMPLANT
IV NS IRRIG 3000ML ARTHROMATIC (IV SOLUTION) ×2 IMPLANT
KIT PROCEDURE FLUENT (KITS) ×1 IMPLANT
KIT TURNOVER CYSTO (KITS) ×1 IMPLANT
PACK VAGINAL MINOR WOMEN LF (CUSTOM PROCEDURE TRAY) ×1 IMPLANT
PAD OB MATERNITY 4.3X12.25 (PERSONAL CARE ITEMS) ×1 IMPLANT
PAD PREP 24X48 CUFFED NSTRL (MISCELLANEOUS) ×1 IMPLANT
SEAL ROD LENS SCOPE MYOSURE (ABLATOR) ×1 IMPLANT
SLEEVE SCD COMPRESS KNEE MED (STOCKING) ×1 IMPLANT
SWAB COLLECTION DEVICE MRSA (MISCELLANEOUS) IMPLANT
SWAB CULTURE ESWAB REG 1ML (MISCELLANEOUS) IMPLANT
TOWEL OR 17X24 6PK STRL BLUE (TOWEL DISPOSABLE) ×1 IMPLANT

## 2022-12-20 NOTE — Anesthesia Preprocedure Evaluation (Addendum)
Anesthesia Evaluation  Patient identified by MRN, date of birth, ID band Patient awake    Reviewed: Allergy & Precautions, NPO status , Patient's Chart, lab work & pertinent test results  History of Anesthesia Complications Negative for: history of anesthetic complications  Airway Mallampati: II  TM Distance: >3 FB Neck ROM: Full    Dental no notable dental hx. (+) Dental Advisory Given   Pulmonary neg pulmonary ROS   Pulmonary exam normal        Cardiovascular hypertension, Normal cardiovascular exam  per previous had nuclear stress test w/ cardiologist w/ dr Clayborn Bigness , results in care everywhere , NUC 02-01-2017 normal , ef 60%; echo 02-02-2017 ef 50% mild AR/TR; and normal stress echo   Neuro/Psych  PSYCHIATRIC DISORDERS     Dementia CVA, Residual Symptoms    GI/Hepatic negative GI ROS, Neg liver ROS,,,  Endo/Other    Morbid obesity  Renal/GU negative Renal ROS     Musculoskeletal negative musculoskeletal ROS (+)    Abdominal   Peds  Hematology negative hematology ROS (+)   Anesthesia Other Findings   Reproductive/Obstetrics                             Anesthesia Physical Anesthesia Plan  ASA: 3  Anesthesia Plan: General   Post-op Pain Management: Tylenol PO (pre-op)* and Toradol IV (intra-op)*   Induction:   PONV Risk Score and Plan: 4 or greater and Ondansetron, Dexamethasone and Diphenhydramine  Airway Management Planned: LMA  Additional Equipment:   Intra-op Plan:   Post-operative Plan: Extubation in OR  Informed Consent: I have reviewed the patients History and Physical, chart, labs and discussed the procedure including the risks, benefits and alternatives for the proposed anesthesia with the patient or authorized representative who has indicated his/her understanding and acceptance.     Dental advisory given  Plan Discussed with: Anesthesiologist and  CRNA  Anesthesia Plan Comments:        Anesthesia Quick Evaluation

## 2022-12-20 NOTE — Discharge Instructions (Signed)

## 2022-12-20 NOTE — Progress Notes (Signed)
All questions answered, no updates to H&P, though patient notes resolved bleeding but increased white discharge and odor.  Discussed that if there is a suspicion of infection, will defer hysteroscopy.  Alpha Gula MD

## 2022-12-20 NOTE — Op Note (Signed)
Operative Note  PREOPERATIVE DIAGNOSES: 1. Thickened endometrium 2. Prolapsing IUD in place  POSTOPERATIVE DIAGNOSES: 1. Pyometra 2. NO IUD in place (suspect expelled)  PROCEDURE PERFORMED: Dilation, curettage, evacuation of pyometra  SURGEON: Dr. Alpha Gula  ANESTHESIA: General   ESTIMATED BLOOD LOSS: 10 cc  URINE OUTPUT: No foley placed  COMPLICATIONS: None   TUBES: None.  DRAINS: None  PATHOLOGY: Aerobic/anaerobic swabs from cervical os.  Small amount of curettings  FINDINGS: On exam, under anesthesia, normal appearing vulva and vagina, a normal sized uterus.  Cervix well visualized and normal appearing. In exam room 10 days prior, IUD strings and lower part of device were visible, this is no longer there. IUD is apparently absent.  Purulent fluid noted from cervix upon sounding and dilation.   Procedure: A general anesthesia was induced and the patient was placed in the dirsal lithotomy position. The perineum and vagina were prepped with iodine solution. A speculum was inserted and the IUD strings and lower part of device were no longer evident at the cervix.  Graspers placed into the cervical canal did not reveal any strings.    The uterus was gently sounded and there was immediate return of purulent fluid.  Gentile dilation was then completed with further return of fluid with use of single tooth tenaculum. Bleeding was well controlled.   A small curette was placed to remove additional purulent contents and a small smount of endometrium.  Excessive curettage was avoided.  Hysteroscopy was deferred due to apparent infection.   The sponge and lap counts were correct times 2 at this time.   The patient's procedure was terminated. We then awakened her. She was sent to the Recovery Room in good condition.

## 2022-12-20 NOTE — Transfer of Care (Signed)
Immediate Anesthesia Transfer of Care Note  Patient: Amy Ray  Procedure(s) Performed: Procedure(s) (LRB): DILATATION AND CURETTAGE (N/A)  Patient Location: PACU  Anesthesia Type: General  Level of Consciousness: awake, sedated, patient cooperative and responds to stimulation  Airway & Oxygen Therapy: Patient Spontanous Breathing and Patient connected to [place] oxygen  Post-op Assessment: Report given to PACU RN, Post -op Vital signs reviewed and stable and Patient moving all extremities  Post vital signs: Reviewed and stable  Complications: No apparent anesthesia complications

## 2022-12-20 NOTE — Anesthesia Postprocedure Evaluation (Signed)
Anesthesia Post Note  Patient: Amy Ray  Procedure(s) Performed: DILATATION AND CURETTAGE     Patient location during evaluation: PACU Anesthesia Type: General Level of consciousness: sedated Pain management: pain level controlled Vital Signs Assessment: post-procedure vital signs reviewed and stable Respiratory status: spontaneous breathing and respiratory function stable Cardiovascular status: stable Postop Assessment: no apparent nausea or vomiting Anesthetic complications: no  No notable events documented.  Last Vitals:  Vitals:   12/20/22 1037 12/20/22 1259  BP: (!) 159/85   Pulse: (!) 103 87  Resp: 18 (!) 24  Temp: 36.7 C 36.6 C  SpO2: 99% 100%    Last Pain:  Vitals:   12/20/22 1037  TempSrc: Oral  PainSc: 0-No pain                 Jorryn Casagrande DANIEL

## 2022-12-20 NOTE — Anesthesia Procedure Notes (Signed)
Procedure Name: LMA Insertion Date/Time: 12/20/2022 12:17 PM  Performed by: Justice Rocher, CRNAPre-anesthesia Checklist: Patient identified, Emergency Drugs available, Suction available, Patient being monitored and Timeout performed Patient Re-evaluated:Patient Re-evaluated prior to induction Oxygen Delivery Method: Circle system utilized Preoxygenation: Pre-oxygenation with 100% oxygen Induction Type: IV induction Ventilation: Mask ventilation without difficulty LMA: LMA inserted LMA Size: 4.0 Number of attempts: 1 Airway Equipment and Method: Bite block Placement Confirmation: positive ETCO2, breath sounds checked- equal and bilateral and CO2 detector Tube secured with: Tape Dental Injury: Teeth and Oropharynx as per pre-operative assessment  Comments: Poor dentition

## 2022-12-21 ENCOUNTER — Encounter (HOSPITAL_BASED_OUTPATIENT_CLINIC_OR_DEPARTMENT_OTHER): Payer: Self-pay | Admitting: Obstetrics and Gynecology

## 2022-12-21 ENCOUNTER — Telehealth: Payer: Self-pay | Admitting: Physician Assistant

## 2022-12-21 LAB — SURGICAL PATHOLOGY

## 2022-12-21 NOTE — Discharge Summary (Signed)
Gynecology Discharge Summary  Amy Ray is a 77 y.o. female that presented on 12/20/2022 for scheduled D&C, IUD removal, possible hysteroscopy for thickened endometrium in the setting of postmenopausal bleeding.   Patient was seen in the office as referral for thickened endometrium and vaginal bleeding. At this time, IUD strings were noted and appeared to be in the lower uterine segment. She denied abdominal pain, fever, chills, at this time.  It was offered to remove IUD in office but she did not tolerate traction on the strings and requested removal under anesthesia.  This was also schedule with endometrial sampling for thickened lining.    On day of her procedure, she reported improved and resolved bleeding, but more white discharge. She again denied pain, fever, chills.  WBC day of surgery was 7.3.  During the procedure, IUD strings were no longer present or palpable with forceps.  Of note, upon uterine sounding and dilation, a large amount of purulent fluid was released from the endometrium.  At this time, it was decided that either the IUD spontaneously expulsed in the interim, or would require hysteroscopy to retrieve. Given active infection and purulence, hysteroscopy was contraindicated at the tiem.  She underwent further dilation to release contents.  Hemostasis was noted.  In PACU, XRAY of abdomen pelvis was obtained and confirmed presence of IUD.   Discussed with patient and patient's daughter anticipated improvement in pyometra with today's drainage, as well as 1x dose IV antibiotics in PACU and PO course prescribed for 14 days. Will plan for in-office ultrasound the following week to assess for displacement of IUD in the setting of fluid collection, as well as reassessment of pyometra.  Further management may include hysteroscopic removal of IUD if pyometra is resolved, or ultrasound guided removal of IUD if purulence persists.   Strict return precautions were reviewed, in  particular for signs of worsening or spreading infection. At time of discharge, she continued to deny pain or fever.  She was afebrile and no leukocytosis noted on admission labs.    Hemoglobin  Date Value Ref Range Status  12/20/2022 9.9 (L) 12.0 - 15.0 g/dL Final  03/17/2021 12.5 11.1 - 15.9 g/dL Final   HCT  Date Value Ref Range Status  12/20/2022 29.0 (L) 36.0 - 46.0 % Final   Hematocrit  Date Value Ref Range Status  03/17/2021 37.0 34.0 - 46.6 % Final    Physical Exam:  Gen: alert, well appearing, no distress Chest: nonlabored breathing CV: no peripheral edema Abdomen: soft, nondistended Ext: no evidence of DVT  Discharge Diagnoses: Pyometra  Discharge Information: Date: 12/21/2022 Activity: Pelvic rest, as tolerated Diet: routine Medications: Doxycyline and flagyl Condition: stable Instructions: Discussed prior to discharge.  Discharge to: [facility] 12/21/2022, 1:03 PM

## 2022-12-21 NOTE — Telephone Encounter (Signed)
Pt needs 2 meds to go to  [facility], [place] AT [address] Phone: [phone]  Fax: [phone]    Ketorolac and Simbrinza  And the rest to go to the mail order: [place], [place] Phone: [phone]  Fax: [phone]

## 2022-12-21 NOTE — Telephone Encounter (Signed)
Patient states not needing any medications at this time. When picking up medications today it was very costly to her and advised she wants all sent to [facility] going forward except for the two indicated in notes. Advised patient the best practice is to contact pharmacy when needing refills so they will send [place] refill request to ensure it is sent to the correct pharmacy. Pt verbalized understanding.

## 2022-12-23 LAB — AEROBIC/ANAEROBIC CULTURE W GRAM STAIN (SURGICAL/DEEP WOUND)

## 2022-12-29 DIAGNOSIS — Z30431 Encounter for routine checking of intrauterine contraceptive device: Secondary | ICD-10-CM | POA: Diagnosis not present

## 2022-12-29 DIAGNOSIS — N719 Inflammatory disease of uterus, unspecified: Secondary | ICD-10-CM | POA: Diagnosis not present

## 2022-12-29 DIAGNOSIS — R9389 Abnormal findings on diagnostic imaging of other specified body structures: Secondary | ICD-10-CM | POA: Diagnosis not present

## 2022-12-29 DIAGNOSIS — Z30432 Encounter for removal of intrauterine contraceptive device: Secondary | ICD-10-CM | POA: Diagnosis not present

## 2022-12-29 DIAGNOSIS — Z4889 Encounter for other specified surgical aftercare: Secondary | ICD-10-CM | POA: Diagnosis not present

## 2022-12-29 DIAGNOSIS — Z09 Encounter for follow-up examination after completed treatment for conditions other than malignant neoplasm: Secondary | ICD-10-CM | POA: Diagnosis not present

## 2022-12-29 DIAGNOSIS — T8369XA Infection and inflammatory reaction due to other prosthetic device, implant and graft in genital tract, initial encounter: Secondary | ICD-10-CM | POA: Diagnosis not present

## 2023-01-03 ENCOUNTER — Ambulatory Visit: Payer: Medicare HMO | Admitting: Internal Medicine

## 2023-01-07 DIAGNOSIS — H4062X0 Glaucoma secondary to drugs, left eye, stage unspecified: Secondary | ICD-10-CM | POA: Diagnosis not present

## 2023-01-07 DIAGNOSIS — H40011 Open angle with borderline findings, low risk, right eye: Secondary | ICD-10-CM | POA: Diagnosis not present

## 2023-01-11 DIAGNOSIS — H409 Unspecified glaucoma: Secondary | ICD-10-CM | POA: Insufficient documentation

## 2023-01-11 DIAGNOSIS — G518 Other disorders of facial nerve: Secondary | ICD-10-CM | POA: Insufficient documentation

## 2023-01-11 DIAGNOSIS — Z96611 Presence of right artificial shoulder joint: Secondary | ICD-10-CM | POA: Insufficient documentation

## 2023-01-11 DIAGNOSIS — M19011 Primary osteoarthritis, right shoulder: Secondary | ICD-10-CM | POA: Insufficient documentation

## 2023-01-11 DIAGNOSIS — K219 Gastro-esophageal reflux disease without esophagitis: Secondary | ICD-10-CM | POA: Insufficient documentation

## 2023-01-11 DIAGNOSIS — Z9889 Other specified postprocedural states: Secondary | ICD-10-CM | POA: Insufficient documentation

## 2023-01-13 DIAGNOSIS — Z4889 Encounter for other specified surgical aftercare: Secondary | ICD-10-CM | POA: Diagnosis not present

## 2023-01-14 ENCOUNTER — Other Ambulatory Visit: Payer: Self-pay

## 2023-01-14 ENCOUNTER — Telehealth: Payer: Self-pay

## 2023-01-14 DIAGNOSIS — Z8673 Personal history of transient ischemic attack (TIA), and cerebral infarction without residual deficits: Secondary | ICD-10-CM

## 2023-01-14 MED ORDER — CLOPIDOGREL BISULFATE 75 MG PO TABS: 75.0000 mg | ORAL_TABLET | Freq: Every day | ORAL | 3 refills | Status: DC

## 2023-01-14 MED ORDER — CARBAMAZEPINE 200 MG PO TABS: 200.0000 mg | ORAL_TABLET | Freq: Two times a day (BID) | ORAL | 2 refills | Status: DC

## 2023-01-14 MED ORDER — SIMVASTATIN 20 MG PO TABS: 20.0000 mg | ORAL_TABLET | Freq: Every day | ORAL | 2 refills | Status: DC

## 2023-01-14 MED ORDER — FAMOTIDINE 40 MG PO TABS: 40.0000 mg | ORAL_TABLET | Freq: Every day | ORAL | 3 refills | Status: DC

## 2023-01-14 MED ORDER — DILTIAZEM HCL ER 180 MG PO CP24: 360.0000 mg | ORAL_CAPSULE | Freq: Every day | ORAL | 0 refills | Status: DC

## 2023-01-14 NOTE — Telephone Encounter (Signed)
Noted. Thank you for taking care of her today. I will need to talk with her about lasix at o/v.

## 2023-01-14 NOTE — Telephone Encounter (Signed)
Patient came in the office upset, stating she was told she had an appt today. She has been speaking with provider for last three appts stating she needs her medicines updated to new pharmacy and still not been corrected. No notes/ phone calls with making [facility] primary pharmacy. Corrected patients prescriptions she was in need of being sent in today per her request. Patient requested Lasix to be filled also but previously filled by Historical provider and advised this would need to be approved by PCP before I could send to pharmacy. Pt verbalized understanding. Pt not sure she will have transportation to her appt she rescheduled for 01/21/23 and will call back to advise.

## 2023-01-18 ENCOUNTER — Encounter: Payer: Medicare Other | Admitting: Obstetrics and Gynecology

## 2023-01-18 ENCOUNTER — Ambulatory Visit: Payer: Medicare HMO | Admitting: Physician Assistant

## 2023-01-21 ENCOUNTER — Encounter: Payer: Self-pay | Admitting: Physician Assistant

## 2023-01-21 ENCOUNTER — Ambulatory Visit (INDEPENDENT_AMBULATORY_CARE_PROVIDER_SITE_OTHER): Payer: Medicare HMO | Admitting: Physician Assistant

## 2023-01-21 VITALS — BP 138/82 | HR 94 | Temp 97.1°F | Ht 61.0 in | Wt 197.6 lb

## 2023-01-21 DIAGNOSIS — H9193 Unspecified hearing loss, bilateral: Secondary | ICD-10-CM

## 2023-01-21 DIAGNOSIS — M25561 Pain in right knee: Secondary | ICD-10-CM | POA: Diagnosis not present

## 2023-01-21 DIAGNOSIS — M25562 Pain in left knee: Secondary | ICD-10-CM | POA: Diagnosis not present

## 2023-01-21 DIAGNOSIS — G8929 Other chronic pain: Secondary | ICD-10-CM | POA: Diagnosis not present

## 2023-01-21 DIAGNOSIS — R600 Localized edema: Secondary | ICD-10-CM

## 2023-01-21 DIAGNOSIS — I10 Essential (primary) hypertension: Secondary | ICD-10-CM | POA: Diagnosis not present

## 2023-01-21 DIAGNOSIS — R002 Palpitations: Secondary | ICD-10-CM | POA: Diagnosis not present

## 2023-01-21 DIAGNOSIS — Z96653 Presence of artificial knee joint, bilateral: Secondary | ICD-10-CM | POA: Diagnosis not present

## 2023-01-21 MED ORDER — FUROSEMIDE 20 MG PO TABS: 20.0000 mg | ORAL_TABLET | Freq: Every day | ORAL | 3 refills | Status: DC

## 2023-01-21 NOTE — Assessment & Plan Note (Signed)
Stable, to goal Diltiazem XR 180 mg 2 capsules daily

## 2023-01-21 NOTE — Patient Instructions (Signed)
Referrals placed today  Lasix 20 mg refilled  Cont to work on low salt diet Keep hydrated Try compression stockings if needed

## 2023-01-21 NOTE — Assessment & Plan Note (Signed)
Controlled with Lasix 20 mg, refilled today

## 2023-01-21 NOTE — Progress Notes (Signed)
Subjective:    Patient ID: Amy Ray, female    DOB: 1946-08-10, 77 y.o.   MRN: [id number]  Chief Complaint  Patient presents with   Referral    Pt in office to discuss needing referrals to ENT, Ortho and Cardiology; pt also needs to discuss needing Lasix refilled; unable to refill previously without speaking to provider first;     HPI Patient is in today for discussion regarding need for referrals. Here with daughter.  Noticing more swelling in feet, legs, ankle. States she has been drinking more water and this helps. States she sits more than she used to, arthritis pain. Noticing some wheezing at night. Able to lay flat on her back. Denies any CP or SOB.  Dr. Sherrie Mustache started pt on Lasix 20 mg in 2021 (?) to take as needed for fluid in legs; pt states she has been taking Lasix 20 mg every day though.   Note from 2021 says her mom had heart attack and daughter has CHF; per daughter and patient this is an error. No hx of CHF in the family.   Pt requesting: ENT / audiology - to have hearing aids adjusted; states she just bought OTC from --[facility]  Cardiology - pt states she has always had one & has had normal stress tests, but she --wants one for safety due to her age; says she has some heart irregularity at night Ortho - for bilateral knee and leg pain; used to see one every 3-4 years    Past Medical History:  Diagnosis Date   Alzheimers disease    Anticoagulant long-term use    plavix--- manged by pcp   Chronic constipation    Diverticulosis of colon    Generalized abdominal pain    Glaucoma, both eyes    Hemorrhoids    History of CVA with residual deficit 03/16/2005   left facial paresthesia;  per pt note from stroke but in neurologist note is cva residual   History of diverticulitis of colon 05/20/2015   History of lower GI bleeding    Hyperlipidemia, mixed    Hypertension    per previous had nuclear stress test w/ cardiologist w/ dr Juliann Pares , results in care  everywhere , NUC 02-01-2017  normal , ef 60%;  echo 02-02-2017 ef 50% mild AR/TR; and normal stress echo   MCI (mild cognitive impairment)    neurology--- Marlowe Kays PA ([facility]);  per note seconardy to vascular and alzeimer disease;  per MRI moderate to extensive CSV ischemia   Nocturia    OA (osteoarthritis)    PMB (postmenopausal bleeding)    Thickened endometrium    Trigeminal neuralgia of left side of face    per pt started after stroke   Vertigo    Wears partial dentures    upper    Past Surgical History:  Procedure Laterality Date   CATARACT EXTRACTION W/ INTRAOCULAR LENS IMPLANT Bilateral    right 2023;  left , yrs ago   CHOLECYSTECTOMY, LAPAROSCOPIC  2000   HYSTEROSCOPY WITH D & C N/A 12/20/2022   Procedure: DILATATION AND CURETTAGE;  Surgeon: Lyn Henri, MD;  Location: [facility];  Service: Gynecology;  Laterality: N/A;   REVERSE SHOULDER ARTHROPLASTY Bilateral    right 06-25-2022 in [place];   approx.  2017  left side in [place]   TOTAL KNEE ARTHROPLASTY Bilateral    2004;  2005   UMBILICAL HERNIA REPAIR     child    Family History  Problem Relation Age of Onset   Stroke Mother    Obesity Mother    Hypertension Mother    Alzheimer's disease Father    Diabetes Father    Hypertension Sister    Hypertension Sister    Hypertension Sister    Hypertension Sister    Hypertension Sister    Breast cancer Sister 106   Diabetes Brother    Breast cancer Maternal Aunt 59   Breast cancer Maternal Aunt     Social History   Tobacco Use   Smoking status: Never   Smokeless tobacco: Never  Vaping Use   Vaping Use: Never used  Substance Use Topics   Alcohol use: No    Alcohol/week: 0.0 standard drinks of alcohol   Drug use: Never     Allergies  Allergen Reactions   Augmentin [Amoxicillin-Pot Clavulanate] Hives   Quinapril-Hydrochlorothiazide Other (See Comments) and Cough    Other reaction(s): Headache     Tramadol     Dizzy,  headache, nosebleed, near-syncope   Silicone Rash    Review of Systems NEGATIVE UNLESS OTHERWISE INDICATED IN HPI      Objective:     BP 138/82 (BP Location: Left Arm)   Pulse 94   Temp (!) 97.1 F (36.2 C) (Temporal)   Ht  (1.549 m)   Wt 197 lb 9.6 oz (89.6 kg)   SpO2 98%   BMI 37.34 kg/m   Wt Readings from Last 3 Encounters:  01/21/23 197 lb 9.6 oz (89.6 kg)  12/20/22 202 lb 11.2 oz (91.9 kg)  12/17/22 200 lb 6.4 oz (90.9 kg)    BP Readings from Last 3 Encounters:  01/21/23 138/82  12/20/22 (!) 161/80  12/17/22 133/77     Physical Exam Vitals and nursing note reviewed.  Constitutional:      Appearance: Normal appearance.  Eyes:     Extraocular Movements: Extraocular movements intact.     Conjunctiva/sclera: Conjunctivae normal.     Pupils: Pupils are equal, round, and reactive to light.  Cardiovascular:     Rate and Rhythm: Normal rate and regular rhythm.     Pulses: Normal pulses.  Pulmonary:     Effort: Pulmonary effort is normal.     Breath sounds: Normal breath sounds.  Musculoskeletal:     Right lower leg: Edema (trace) present.     Left lower leg: Edema (trace) present.  Neurological:     Mental Status: She is alert and oriented to person, place, and time.  Psychiatric:        Mood and Affect: Mood normal.        Behavior: Behavior normal.        Assessment & Plan:  Essential (primary) hypertension Assessment & Plan: Stable, to goal Diltiazem XR 180 mg 2 capsules daily   Orders: -     Ambulatory referral to Cardiology  Bilateral lower extremity edema Assessment & Plan: Controlled with Lasix 20 mg, refilled today   Orders: -     Ambulatory referral to Cardiology  Palpitations -     Ambulatory referral to Cardiology  Chronic pain of both knees -     Ambulatory referral to Orthopedics  History of total bilateral knee replacement -     Ambulatory referral to Orthopedics  Bilateral hearing loss, unspecified hearing loss  type -     Ambulatory referral to Audiology  Other orders -     Furosemide; Take 1 tablet (20 mg total) by mouth daily.  Dispense: 90 tablet;  Refill: 3        Return in about 6 months (around 07/23/2023) for med / lab recheck .    Shela Esses M Malaina Mortellaro, PA-C

## 2023-01-25 ENCOUNTER — Other Ambulatory Visit: Payer: Self-pay

## 2023-01-25 ENCOUNTER — Telehealth: Payer: Self-pay

## 2023-01-25 ENCOUNTER — Telehealth: Payer: Self-pay | Admitting: Physician Assistant

## 2023-01-25 MED ORDER — SIMVASTATIN 10 MG PO TABS: 10.0000 mg | ORAL_TABLET | Freq: Every day | ORAL | 3 refills | Status: DC

## 2023-01-25 NOTE — Telephone Encounter (Signed)
[facility] would like a call back. Please call them back at [phone] [facility] or any pharmacist can help

## 2023-01-25 NOTE — Telephone Encounter (Signed)
New Rx for  Simvastatin sent to [facility] to be filled

## 2023-01-25 NOTE — Telephone Encounter (Signed)
Returned call spoke with Selena Batten pharmacist advised continuing both therapies and adjusting statin . Pharmacy documented for patient and sending out medication to patient.

## 2023-01-25 NOTE — Telephone Encounter (Signed)
Amy Ray with [facility] pharmacy calling to verify and confirm it os ok for patient to be taking the prescribed dose of Diltiazem 180 mG while taking Simvastatin . Waited on hold to speak to pharmacist for several minutes and had to release call. Please advise so we can contact Pharmacist with verbal approval

## 2023-02-02 ENCOUNTER — Other Ambulatory Visit: Payer: Self-pay

## 2023-02-07 ENCOUNTER — Telehealth: Payer: Self-pay | Admitting: Physician Assistant

## 2023-02-07 NOTE — Telephone Encounter (Signed)
Please advise if ok to send future script

## 2023-02-07 NOTE — Telephone Encounter (Signed)
Caller states that patient's refills on nortriptyline (PAMELOR) 10 MG capsule sent over to [facility] pharmacy. States that they want to have order on file so they can send medication before patient runs out in the future.

## 2023-02-08 ENCOUNTER — Other Ambulatory Visit: Payer: Self-pay

## 2023-02-08 MED ORDER — NORTRIPTYLINE HCL 10 MG PO CAPS: 10.0000 mg | ORAL_CAPSULE | Freq: Every day | ORAL | 3 refills | Status: DC

## 2023-02-08 NOTE — Telephone Encounter (Signed)
Rx sent to [facility] per request and provider approval

## 2023-02-11 ENCOUNTER — Other Ambulatory Visit: Payer: Self-pay

## 2023-02-23 ENCOUNTER — Ambulatory Visit: Payer: Medicare HMO | Admitting: Orthopedic Surgery

## 2023-03-14 ENCOUNTER — Ambulatory Visit (INDEPENDENT_AMBULATORY_CARE_PROVIDER_SITE_OTHER): Payer: Medicare HMO | Admitting: Orthopedic Surgery

## 2023-03-14 ENCOUNTER — Other Ambulatory Visit (INDEPENDENT_AMBULATORY_CARE_PROVIDER_SITE_OTHER): Payer: Medicare HMO

## 2023-03-14 ENCOUNTER — Encounter: Payer: Self-pay | Admitting: Orthopedic Surgery

## 2023-03-14 DIAGNOSIS — M25561 Pain in right knee: Secondary | ICD-10-CM

## 2023-03-14 DIAGNOSIS — M25562 Pain in left knee: Secondary | ICD-10-CM

## 2023-03-14 DIAGNOSIS — G8929 Other chronic pain: Secondary | ICD-10-CM

## 2023-03-14 NOTE — Progress Notes (Signed)
Office Visit Note   Patient: Amy Ray           Date of Birth: [birth year]           MRN: [id number] Visit Date: 03/14/2023 Requested by: Allwardt, Crist Infante, PA-C [address] [place],  [place] [address] PCP: Bary Leriche, PA-C  Subjective: Chief Complaint  Patient presents with   Right Knee - Pain   Left Knee - Pain    HPI: Amy Ray is a 77 y.o. female who presents to the office reporting bilateral knee pain left worse than right.  Patient describes a history of bilateral knee replacements done about 20 years ago in [place].  Started having little stiffness on that left-hand side.  Not taking any pain medicine.  Denies any fevers or chills.  She does use a walker.  Has been using it for about a year.  She does need assistance with ambulation according to her daughter who is with her today..                ROS: All systems reviewed are negative as they relate to the chief complaint within the history of present illness.  Patient denies fevers or chills.  Assessment & Plan: Visit Diagnoses:  1. Chronic pain of both knees     Plan: Impression is no obvious complicating feature with the left total knee replacement.  She has excellent range of motion excellent alignment no effusion no radiographic abnormality and very good stability.  No intervention really required on that left knee at this time.  Does not appear to be radiating pain from the back or the hip.  She will follow-up with [place] as needed.  Follow-Up Instructions: No follow-ups on file.   Orders:  Orders Placed This Encounter  Procedures   XR KNEE 3 VIEW LEFT   XR KNEE 3 VIEW RIGHT   No orders of the defined types were placed in this encounter.     Procedures: No procedures performed   Clinical Data: No additional findings.  Objective: Vital Signs: There were no vitals taken for this visit.  Physical Exam:  Constitutional: Patient appears well-developed HEENT:  Head:  Normocephalic Eyes:EOM are normal Neck: Normal range of motion Cardiovascular: Normal rate Pulmonary/chest: Effort normal Neurologic: Patient is alert Skin: Skin is warm Psychiatric: Patient has normal mood and affect  Ortho Exam: Ortho exam demonstrates range of motion of both knees from 0 to about 115.  No effusion in either knee.  Patella tracks well.  Collaterals are stable to varus valgus stress at 0 30 and 90 degrees.  No calf tenderness negative Homans.  No warmth to either knee.  No groin pain with internal/external rotation of either leg.  No nerve root tension signs.  No paresthesias L1-S1 bilaterally.  Specialty Comments:  No specialty comments available.  Imaging: XR KNEE 3 VIEW RIGHT  Result Date: 03/14/2023 [facility] lateral merchant radiographs right knee reviewed.  No acute fracture.  Total knee prosthesis in good position alignment with no complicating features.  No lucencies of the bone cement interface  XR KNEE 3 VIEW LEFT  Result Date: 03/14/2023 AP lateral merchant radiographs left knee reviewed.  Total knee prosthesis in good position alignment with no complicating features.  No lucencies between the bone cement interface.    PMFS History: Patient Active Problem List   Diagnosis Date Noted   Chronic pain of both knees 01/21/2023   Bilateral lower extremity edema 01/21/2023   History of reverse  total replacement of right shoulder joint 01/11/2023   Osteoarthritis of right shoulder 01/11/2023   Glaucoma 01/11/2023   Gastroesophageal reflux disease 01/11/2023   History of shoulder surgery 01/11/2023   Facial neuralgia 01/11/2023   Pain in joint of right shoulder 07/16/2022   Mild cognitive impairment 02/08/2022   Mild late onset Alzheimer's dementia (HCC) 11/11/2021   Vitamin D deficiency 09/21/2020   Hypokalemia 08/05/2020   History of total bilateral knee replacement 06/20/2018   Low back pain 05/20/2015   Fatigue 05/20/2015   History of CVA (cerebrovascular  accident) 05/20/2015   Insomnia 05/20/2015   Morbid obesity (HCC) 05/20/2015   Positive H. pylori test 05/20/2015   Tremor 05/20/2015   Dysfunction of eustachian tube 05/20/2015   Diverticulosis 05/20/2015   Essential (primary) hypertension 07/17/2007   Alterations of sensations, late effect of cerebrovascular disease 03/11/2005   Hyperlipidemia 10/12/2003   Trigeminal neuralgia 10/12/2003   Past Medical History:  Diagnosis Date   Alzheimers disease (HCC)    Anticoagulant long-term use    plavix--- manged by pcp   Chronic constipation    Diverticulosis of colon    Generalized abdominal pain    Glaucoma, both eyes    Hemorrhoids    History of CVA with residual deficit 03/16/2005   left facial paresthesia;  per pt note from stroke but in neurologist note is cva residual   History of diverticulitis of colon 05/20/2015   History of lower GI bleeding    Hyperlipidemia, mixed    Hypertension    per previous had nuclear stress test w/ cardiologist w/ dr Juliann Pares , results in care everywhere , NUC 02-01-2017  normal , ef 60%;  echo 02-02-2017 ef 50% mild AR/TR; and normal stress echo   MCI (mild cognitive impairment)    neurology--- Marlowe Kays PA ([facility]);  per note seconardy to vascular and alzeimer disease;  per MRI moderate to extensive CSV ischemia   Nocturia    OA (osteoarthritis)    PMB (postmenopausal bleeding)    Thickened endometrium    Trigeminal neuralgia of left side of face    per pt started after stroke   Vertigo    Wears partial dentures    upper    Family History  Problem Relation Age of Onset   Stroke Mother    Obesity Mother    Hypertension Mother    Alzheimer's disease Father    Diabetes Father    Hypertension Sister    Hypertension Sister    Hypertension Sister    Hypertension Sister    Hypertension Sister    Breast cancer Sister 15   Diabetes Brother    Breast cancer Maternal Aunt 16   Breast cancer Maternal Aunt     Past Surgical  History:  Procedure Laterality Date   CATARACT EXTRACTION W/ INTRAOCULAR LENS IMPLANT Bilateral    right 2023;  left , yrs ago   CHOLECYSTECTOMY, LAPAROSCOPIC  2000   HYSTEROSCOPY WITH D & C N/A 12/20/2022   Procedure: DILATATION AND CURETTAGE;  Surgeon: Lyn Henri, MD;  Location: [facility];  Service: Gynecology;  Laterality: N/A;   REVERSE SHOULDER ARTHROPLASTY Bilateral    right 06-25-2022 in [place];   approx.  2017  left side in [place]   TOTAL KNEE ARTHROPLASTY Bilateral    2004;  2005   UMBILICAL HERNIA REPAIR     child   Social History   Occupational History   Occupation: Retired  Tobacco Use   Smoking status:  Never   Smokeless tobacco: Never  Vaping Use   Vaping Use: Never used  Substance and Sexual Activity   Alcohol use: No    Alcohol/week: 0.0 standard drinks of alcohol   Drug use: Never   Sexual activity: Not on file

## 2023-03-15 ENCOUNTER — Ambulatory Visit: Payer: Medicare HMO | Admitting: Interventional Cardiology

## 2023-03-16 ENCOUNTER — Encounter: Payer: Self-pay | Admitting: Cardiology

## 2023-03-16 ENCOUNTER — Ambulatory Visit: Payer: Medicare HMO | Attending: Interventional Cardiology | Admitting: Cardiology

## 2023-03-16 VITALS — BP 122/78 | HR 89 | Ht 61.0 in | Wt 210.4 lb

## 2023-03-16 DIAGNOSIS — Z8673 Personal history of transient ischemic attack (TIA), and cerebral infarction without residual deficits: Secondary | ICD-10-CM

## 2023-03-16 DIAGNOSIS — I10 Essential (primary) hypertension: Secondary | ICD-10-CM

## 2023-03-16 DIAGNOSIS — E7800 Pure hypercholesterolemia, unspecified: Secondary | ICD-10-CM

## 2023-03-16 DIAGNOSIS — R002 Palpitations: Secondary | ICD-10-CM | POA: Diagnosis not present

## 2023-03-16 NOTE — Patient Instructions (Signed)
Medication Instructions:  Your physician recommends that you continue on your current medications as directed. Please refer to the Current Medication list given to you today.  *If you need a refill on your cardiac medications before your next appointment, please call your pharmacy*  Lab Work: None ordered today.  Testing/Procedures: None ordered today.  Follow-Up: At [facility], you and your health needs are our priority.  As part of our continuing mission to provide you with exceptional heart care, we have created designated Provider Care Teams.  These Care Teams include your primary Cardiologist (physician) and Advanced Practice Providers (APPs -  Physician Assistants and Nurse Practitioners) who all work together to provide you with the care you need, when you need it.  Your next appointment:   1 year(s)  The format for your next appointment:   In Person  Provider:   Donato Schultz, MD {

## 2023-03-16 NOTE — Progress Notes (Signed)
Cardiology Office Note:    Date:  03/16/2023   ID:  Amy Ray, DOB Sep 06, 1946, MRN [id number]  PCP:  Allwardt, Amy Infante, PA-C   [facility] Providers Cardiologist:  Amy Schultz, MD     Referring MD: Amy Leriche, PA-C    History of Present Illness:    Amy Ray is a 77 y.o. female here for evaluation of prior palpitations.  Her daughter present, Amy Ray helps with historical items, works for the [place] [employer].  In review of primary providers note in 01/21/2023 has been noticing more swelling in her feet and ankles.  Sits more than she used to secondary to arthritic pain.  Hard to lay on her back.  No significant chest pain or shortness of breath.  She has not felt any worsening palpitations.  Had an isolated episode which prompted an ER evaluation which was normal.  EKG personally reviewed was normal.  ER notes reviewed.  Previously she had been started on Lasix 20 mg in 2021 as needed but she has been taking this daily.  Doing well with this.  She does drink a lot of water she states.  Has tried compression hose in the past but had to cut them off.  In the past she has had normal stress test.  Previously in 2018 EF 60%, echo in 2018 EF 50% with mild AR and TR.  Normal stress echo.  Previously had seen Dr. Juliann Ray. Used to see once a year.   No syncope. Mother died with cardiac arrest, 21, she had several comorbidities.   CVA 2006 - minor stroke.  Remains on aspirin and Plavix.  Past Medical History:  Diagnosis Date   Alzheimers disease (HCC)    Anticoagulant long-term use    plavix--- manged by pcp   Chronic constipation    Diverticulosis of colon    Generalized abdominal pain    Glaucoma, both eyes    Hemorrhoids    History of CVA with residual deficit 03/16/2005   left facial paresthesia;  per pt note from stroke but in neurologist note is cva residual   History of diverticulitis of colon 05/20/2015   History of lower GI bleeding    Hyperlipidemia,  mixed    Hypertension    per previous had nuclear stress test w/ cardiologist w/ dr Amy Ray , results in care everywhere , NUC 02-01-2017  normal , ef 60%;  echo 02-02-2017 ef 50% mild AR/TR; and normal stress echo   MCI (mild cognitive impairment)    neurology--- Amy Kays PA ([facility]);  per note seconardy to vascular and alzeimer disease;  per MRI moderate to extensive CSV ischemia   Nocturia    OA (osteoarthritis)    PMB (postmenopausal bleeding)    Thickened endometrium    Trigeminal neuralgia of left side of face    per pt started after stroke   Vertigo    Wears partial dentures    upper    Past Surgical History:  Procedure Laterality Date   CATARACT EXTRACTION W/ INTRAOCULAR LENS IMPLANT Bilateral    right 2023;  left , yrs ago   CHOLECYSTECTOMY, LAPAROSCOPIC  2000   HYSTEROSCOPY WITH D & C N/A 12/20/2022   Procedure: DILATATION AND CURETTAGE;  Surgeon: Lyn Henri, MD;  Location: [facility];  Service: Gynecology;  Laterality: N/A;   REVERSE SHOULDER ARTHROPLASTY Bilateral    right 06-25-2022 in [place];   approx.  2017  left side in [place]   TOTAL KNEE  ARTHROPLASTY Bilateral    2004;  2005   UMBILICAL HERNIA REPAIR     child    Current Medications: Current Meds  Medication Sig   Acetaminophen (TYLENOL EX ST ARTHRITIS PAIN PO) Take 2 tablets by mouth daily as needed (for arthritis pain).   aspirin 81 MG tablet Take 1 tablet by mouth daily.   carbamazepine (TEGRETOL) 200 MG tablet Take 1 tablet (200 mg total) by mouth 2 (two) times daily.   clopidogrel (PLAVIX) 75 MG tablet Take 1 tablet (75 mg total) by mouth daily.   diltiazem (DILACOR XR) 180 MG 24 hr capsule Take 2 capsules (360 mg total) by mouth daily.   famotidine (PEPCID) 40 MG tablet Take 1 tablet (40 mg total) by mouth at bedtime.   furosemide (LASIX) 20 MG tablet Take 1 tablet (20 mg total) by mouth daily.   nortriptyline (PAMELOR) 10 MG capsule Take 1 capsule (10 mg total) by  mouth at bedtime. TAKE 1 TO 2 CAPSULES BY MOUTH AT BEDTIME Strength: 10 mg   polyethylene glycol (MIRALAX / GLYCOLAX) 17 g packet Take 17 g by mouth 2 (two) times daily. (Patient taking differently: Take 17 g by mouth daily as needed for mild constipation.)   SIMBRINZA 1-0.2 % SUSP Place 1 drop into both eyes 2 (two) times daily.   simvastatin (ZOCOR) 10 MG tablet Take 1 tablet (10 mg total) by mouth at bedtime.   VITAMIN D PO Take 1 capsule by mouth daily.     Allergies:   Augmentin [amoxicillin-pot clavulanate], Quinapril-hydrochlorothiazide, Tramadol, and Silicone   Social History   Socioeconomic History   Marital status: Married    Spouse name: Not on file   Number of children: 4   Years of education: Not on file   Highest education level: High school graduate  Occupational History   Occupation: Retired  Tobacco Use   Smoking status: Never   Smokeless tobacco: Never  Vaping Use   Vaping Use: Never used  Substance and Sexual Activity   Alcohol use: No    Alcohol/week: 0.0 standard drinks of alcohol   Drug use: Never   Sexual activity: Not on file  Other Topics Concern   Not on file  Social History Narrative   Not on file   Social Determinants of Health   Financial Resource Strain: Low Risk  (10/22/2022)   Overall Financial Resource Strain (CARDIA)    Difficulty of Paying Living Expenses: Not hard at all  Food Insecurity: No Food Insecurity (10/22/2022)   Hunger Vital Sign    Worried About Running Out of Food in the Last Year: Never true    Ran Out of Food in the Last Year: Never true  Transportation Needs: No Transportation Needs (10/22/2022)   PRAPARE - Administrator, Civil Service (Medical): No    Lack of Transportation (Non-Medical): No  Physical Activity: Sufficiently Active (10/22/2022)   Exercise Vital Sign    Days of Exercise per Week: 5 days    Minutes of Exercise per Session: 30 min  Stress: No Stress Concern Present (10/22/2022)   [employer] of Occupational Health - Occupational Stress Questionnaire    Feeling of Stress : Not at all  Social Connections: Moderately Isolated (10/22/2022)   Social Connection and Isolation Panel [NHANES]    Frequency of Communication with Friends and Family: More than three times a week    Frequency of Social Gatherings with Friends and Family: More than three times a week  Attends Religious Services: More than 4 times per year    Active Member of Clubs or Organizations: No    Attends Banker Meetings: Never    Marital Status: Separated     Family History: The patient's family history includes Alzheimer's disease in her father; Breast cancer in her maternal aunt; Breast cancer (age of onset: 62) in her sister; Breast cancer (age of onset: 28) in her maternal aunt; Diabetes in her brother and father; Hypertension in her mother, sister, sister, sister, sister, and sister; Obesity in her mother; Stroke in her mother.  ROS:   Please see the history of present illness.     All other systems reviewed and are negative.  EKGs/Labs/Other Studies Reviewed:    The following studies were reviewed today:    Prior office notes lab work reviewed, creatinine 0.9 hemoglobin A1c 5.5 LDL 74  EKG: January 2024-sinus rhythm no other significant abnormalities.  Recent Labs: 10/15/2022: Magnesium 2.1 12/01/2022: B Natriuretic Peptide 73.1 12/17/2022: ALT 28 12/20/2022: BUN 15; Creatinine, Ser 0.90; Hemoglobin 9.9; Platelets 237; Potassium 5.4; Sodium 138  Recent Lipid Panel    Component Value Date/Time   CHOL 197 09/19/2020 0900   TRIG 69 09/19/2020 0900   HDL 111 09/19/2020 0900   CHOLHDL 1.8 09/19/2020 0900   LDLCALC 74 09/19/2020 0900     Risk Assessment/Calculations:              Physical Exam:    VS:  BP 122/78   Pulse 89   Ht 5\' 1"  (1.549 m)   Wt 210 lb 6.4 oz (95.4 kg)   SpO2 98%   BMI 39.75 kg/m     Wt Readings from Last 3 Encounters:  03/16/23 210 lb 6.4 oz  (95.4 kg)  01/21/23 197 lb 9.6 oz (89.6 kg)  12/20/22 202 lb 11.2 oz (91.9 kg)     GEN:  Well nourished, well developed in no acute distress, ambulates with walker HEENT: Normal NECK: No JVD; No carotid bruits LYMPHATICS: No lymphadenopathy CARDIAC: RRR, no murmurs, rubs, gallops RESPIRATORY:  Clear to auscultation without rales, wheezing or rhonchi  ABDOMEN: Soft, non-tender, non-distended MUSCULOSKELETAL:  No edema; No deformity  SKIN: Warm and dry NEUROLOGIC:  Alert and oriented x 3 PSYCHIATRIC:  Normal affect   ASSESSMENT:    1. Palpitations   2. Essential (primary) hypertension   3. Pure hypercholesterolemia   4. History of CVA (cerebrovascular accident)    PLAN:    In order of problems listed above:  Palpitations - Thankfully she has not had any further recurrence.  If symptoms worsen or become worrisome we will have low threshold for monitoring.  Described to her the ZIO monitor.  We will hold off at this time.  Watch caffeine use.  Lower extremity edema -Dependent edema.  Has tried compression hose in the past but did not have success with these and that she had to cut them off.  Has trouble getting them on and off.  Continue with Lasix 20 mg a day.  Last creatinine normal.  Primary hypertension -Doing well with diltiazem 360 mg.  No changes made.  Prior stroke - In the early 2000's.  Currently on aspirin and Plavix.  Prior MRI of brain in 2013 did not show any significant abnormalities.  Hyperlipidemia - Continue with simvastatin.  Her LDL 74.  Close to goal of less than 70.            Medication Adjustments/Labs and Tests Ordered: Current medicines are  reviewed at length with the patient today.  Concerns regarding medicines are outlined above.  No orders of the defined types were placed in this encounter.  No orders of the defined types were placed in this encounter.   Patient Instructions  Medication Instructions:  Your physician recommends that  you continue on your current medications as directed. Please refer to the Current Medication list given to you today.  *If you need a refill on your cardiac medications before your next appointment, please call your pharmacy*  Lab Work: None ordered today.  Testing/Procedures: None ordered today.  Follow-Up: At [facility], you and your health needs are our priority.  As part of our continuing mission to provide you with exceptional heart care, we have created designated Provider Care Teams.  These Care Teams include your primary Cardiologist (physician) and Advanced Practice Providers (APPs -  Physician Assistants and Nurse Practitioners) who all work together to provide you with the care you need, when you need it.  Your next appointment:   1 year(s)  The format for your next appointment:   In Person  Provider:   Donato Schultz, MD {   Signed, Amy Schultz, MD  03/16/2023 10:56 AM    [facility]

## 2023-04-08 ENCOUNTER — Other Ambulatory Visit: Payer: Self-pay | Admitting: Physician Assistant

## 2023-04-11 ENCOUNTER — Telehealth: Payer: Self-pay | Admitting: Cardiology

## 2023-04-11 DIAGNOSIS — R002 Palpitations: Secondary | ICD-10-CM

## 2023-04-11 NOTE — Telephone Encounter (Signed)
Spoke with patient who is reporting her heart is not beating regularly and she notices this only at night.  She also is reporting some wheezing sounds when she gets up in the night to go to the bathroom.  She denies any s/s of SOB/dizziness etc.  She takes Furosemide 20 mg daily and denies having any swelling at ankles.  Pt was seen recently (6/5) and was instructed to call back if irregular HB returned or began worrisome.  Per Dr Anne Fu note she may need heart monitoring.  Will forward this information to him for review and call pt back with any new orders.

## 2023-04-11 NOTE — Telephone Encounter (Signed)
Patient c/o Palpitations:  High priority if patient c/o lightheadedness, shortness of breath, or chest pain  How long have you had palpitations/irregular HR/ Afib? Are you having the symptoms now? 5 months ago. No, she states happens mostly at night   Are you currently experiencing lightheadedness, SOB or CP? No   Do you have a history of afib (atrial fibrillation) or irregular heart rhythm? Yes   Have you checked your BP or HR? (document readings if available): N/A   Are you experiencing any other symptoms? No. She was just told to call if this started again for her.

## 2023-04-13 ENCOUNTER — Encounter (HOSPITAL_BASED_OUTPATIENT_CLINIC_OR_DEPARTMENT_OTHER): Payer: Self-pay

## 2023-04-13 ENCOUNTER — Ambulatory Visit: Payer: Medicare HMO | Attending: Cardiology

## 2023-04-13 DIAGNOSIS — R002 Palpitations: Secondary | ICD-10-CM

## 2023-04-13 NOTE — Progress Notes (Unsigned)
Enrolled for [employer] to mail a ZIO XT long term holter monitor to the patients address on file.  

## 2023-04-13 NOTE — Telephone Encounter (Signed)
Zio order placed ?

## 2023-04-18 DIAGNOSIS — R002 Palpitations: Secondary | ICD-10-CM | POA: Diagnosis not present

## 2023-05-03 ENCOUNTER — Emergency Department (HOSPITAL_BASED_OUTPATIENT_CLINIC_OR_DEPARTMENT_OTHER): Payer: Medicare HMO

## 2023-05-03 ENCOUNTER — Encounter (HOSPITAL_BASED_OUTPATIENT_CLINIC_OR_DEPARTMENT_OTHER): Payer: Self-pay

## 2023-05-03 ENCOUNTER — Emergency Department (HOSPITAL_BASED_OUTPATIENT_CLINIC_OR_DEPARTMENT_OTHER)
Admission: EM | Admit: 2023-05-03 | Discharge: 2023-05-04 | Disposition: A | Discharge status: Home / Self Care | Payer: Medicare HMO | Attending: Emergency Medicine | Admitting: Emergency Medicine

## 2023-05-03 DIAGNOSIS — R062 Wheezing: Secondary | ICD-10-CM | POA: Diagnosis not present

## 2023-05-03 DIAGNOSIS — Z7902 Long term (current) use of antithrombotics/antiplatelets: Secondary | ICD-10-CM | POA: Diagnosis not present

## 2023-05-03 DIAGNOSIS — I10 Essential (primary) hypertension: Secondary | ICD-10-CM | POA: Diagnosis not present

## 2023-05-03 DIAGNOSIS — R519 Headache, unspecified: Secondary | ICD-10-CM | POA: Diagnosis not present

## 2023-05-03 DIAGNOSIS — R0602 Shortness of breath: Secondary | ICD-10-CM | POA: Insufficient documentation

## 2023-05-03 DIAGNOSIS — Z7982 Long term (current) use of aspirin: Secondary | ICD-10-CM | POA: Diagnosis not present

## 2023-05-03 DIAGNOSIS — Z79899 Other long term (current) drug therapy: Secondary | ICD-10-CM | POA: Diagnosis not present

## 2023-05-03 DIAGNOSIS — R000 Tachycardia, unspecified: Secondary | ICD-10-CM | POA: Diagnosis not present

## 2023-05-03 DIAGNOSIS — Z96612 Presence of left artificial shoulder joint: Secondary | ICD-10-CM | POA: Diagnosis not present

## 2023-05-03 DIAGNOSIS — R42 Dizziness and giddiness: Secondary | ICD-10-CM | POA: Diagnosis not present

## 2023-05-03 DIAGNOSIS — Z20822 Contact with and (suspected) exposure to covid-19: Secondary | ICD-10-CM | POA: Diagnosis not present

## 2023-05-03 DIAGNOSIS — N179 Acute kidney failure, unspecified: Secondary | ICD-10-CM | POA: Diagnosis not present

## 2023-05-03 LAB — CBC WITH DIFFERENTIAL/PLATELET
Abs Immature Granulocytes: 0.06 10*3/uL (ref 0.00–0.07)
Basophils Absolute: 0 10*3/uL (ref 0.0–0.1)
Basophils Relative: 0 %
Eosinophils Absolute: 0.1 10*3/uL (ref 0.0–0.5)
Eosinophils Relative: 2 %
HCT: 34.9 % — ABNORMAL LOW (ref 36.0–46.0)
Hemoglobin: 11.7 g/dL — ABNORMAL LOW (ref 12.0–15.0)
Immature Granulocytes: 1 %
Lymphocytes Relative: 34 %
Lymphs Abs: 2.7 10*3/uL (ref 0.7–4.0)
MCH: 34.1 pg — ABNORMAL HIGH (ref 26.0–34.0)
MCHC: 33.5 g/dL (ref 30.0–36.0)
MCV: 101.7 fL — ABNORMAL HIGH (ref 80.0–100.0)
Monocytes Absolute: 1 10*3/uL (ref 0.1–1.0)
Monocytes Relative: 13 %
Neutro Abs: 4 10*3/uL (ref 1.7–7.7)
Neutrophils Relative %: 50 %
Platelets: 220 10*3/uL (ref 150–400)
RBC: 3.43 MIL/uL — ABNORMAL LOW (ref 3.87–5.11)
RDW: 12.7 % (ref 11.5–15.5)
WBC: 8 10*3/uL (ref 4.0–10.5)
nRBC: 0 % (ref 0.0–0.2)

## 2023-05-03 LAB — COMPREHENSIVE METABOLIC PANEL
ALT: 44 U/L (ref 0–44)
AST: 33 U/L (ref 15–41)
Albumin: 3.8 g/dL (ref 3.5–5.0)
Alkaline Phosphatase: 116 U/L (ref 38–126)
Anion gap: 8 (ref 5–15)
BUN: 27 mg/dL — ABNORMAL HIGH (ref 8–23)
CO2: 26 mmol/L (ref 22–32)
Calcium: 8.6 mg/dL — ABNORMAL LOW (ref 8.9–10.3)
Chloride: 101 mmol/L (ref 98–111)
Creatinine, Ser: 1.29 mg/dL — ABNORMAL HIGH (ref 0.44–1.00)
GFR, Estimated: 43 mL/min — ABNORMAL LOW (ref >60)
Glucose, Bld: 91 mg/dL (ref 70–99)
Potassium: 4.3 mmol/L (ref 3.5–5.1)
Sodium: 135 mmol/L (ref 135–145)
Total Bilirubin: 0.3 mg/dL (ref 0.3–1.2)
Total Protein: 7.4 g/dL (ref 6.5–8.1)

## 2023-05-03 LAB — D-DIMER, QUANTITATIVE: D-Dimer, Quant: 1.6 ug/mL-FEU — ABNORMAL HIGH (ref 0.00–0.50)

## 2023-05-03 LAB — BRAIN NATRIURETIC PEPTIDE: B Natriuretic Peptide: 36.1 pg/mL (ref 0.0–100.0)

## 2023-05-03 LAB — TROPONIN I (HIGH SENSITIVITY): Troponin I (High Sensitivity): 5 ng/L (ref <18)

## 2023-05-03 MED ORDER — IOHEXOL 350 MG/ML SOLN
100.0000 mL | Freq: Once | INTRAVENOUS | Status: AC | PRN
  Administered 2023-05-03: 75 mL via INTRAVENOUS

## 2023-05-03 MED ORDER — IPRATROPIUM-ALBUTEROL 0.5-2.5 (3) MG/3ML IN SOLN
3.0000 mL | Freq: Once | RESPIRATORY_TRACT | Status: AC
  Administered 2023-05-03: 3 mL via RESPIRATORY_TRACT
  Filled 2023-05-03: qty 3

## 2023-05-03 NOTE — ED Notes (Signed)
Called to room x1, no answer.

## 2023-05-03 NOTE — ED Notes (Signed)
Attempted to get an IV x 2  Pt states they usually use [place]

## 2023-05-03 NOTE — ED Triage Notes (Signed)
SHOB over a week Dizziness and HA x 2 days

## 2023-05-03 NOTE — ED Provider Notes (Signed)
[facility] AT [facility] Provider Note   CSN: [id number] Arrival date & time: 05/03/23  2052     History  Chief Complaint  Patient presents with   Shortness of Breath    Amy Ray is a 77 y.o. female.  Patient is a 77 yo female with of hypertension, hyperlipidemia, and stroke in 2016 on plavix presenting for sob. Patient admits to intermittent sob worse with exertion x 3-4 weeks. Admits to wheezing. States wheezing is worse at night. Denies smoking hx. No previous asthma dx. Denies fevers, chills, or coughing. Denies chest pain. Admits to minimal bilateral lower extremity swelling. Denies orthopnea. Denies hx of dVT or Pe.   The history is provided by the patient. No language interpreter was used.  Shortness of Breath Associated symptoms: wheezing   Associated symptoms: no abdominal pain, no chest pain, no cough, no ear pain, no fever, no rash, no sore throat and no vomiting        Home Medications Prior to Admission medications   Medication Sig Start Date End Date Taking? Authorizing Provider  Acetaminophen (TYLENOL EX ST ARTHRITIS PAIN PO) Take 2 tablets by mouth daily as needed (for arthritis pain).    [provider]  aspirin 81 MG tablet Take 1 tablet by mouth daily.    [provider]  carbamazepine (TEGRETOL) 200 MG tablet Take 1 tablet (200 mg total) by mouth 2 (two) times daily. 01/14/23   Allwardt, Crist Infante, PA-C  clopidogrel (PLAVIX) 75 MG tablet Take 1 tablet (75 mg total) by mouth daily. 01/14/23   Allwardt, Crist Infante, PA-C  diltiazem (DILACOR XR) 180 MG 24 hr capsule TAKE 2 CAPSULES EVERY DAY 04/08/23   Allwardt, Alyssa M, PA-C  famotidine (PEPCID) 40 MG tablet Take 1 tablet (40 mg total) by mouth at bedtime. 01/14/23   Allwardt, Crist Infante, PA-C  furosemide (LASIX) 20 MG tablet Take 1 tablet (20 mg total) by mouth daily. 01/21/23   Allwardt, Crist Infante, PA-C  nortriptyline (PAMELOR) 10 MG capsule Take 1 capsule (10 mg total)  by mouth at bedtime. TAKE 1 TO 2 CAPSULES BY MOUTH AT BEDTIME Strength: 10 mg 02/08/23   Allwardt, Alyssa M, PA-C  polyethylene glycol (MIRALAX / GLYCOLAX) 17 g packet Take 17 g by mouth 2 (two) times daily. Patient taking differently: Take 17 g by mouth daily as needed for mild constipation. 08/03/22   Charlynne Pander, MD  SIMBRINZA 1-0.2 % SUSP Place 1 drop into both eyes 2 (two) times daily. 05/27/22   [provider]  simvastatin (ZOCOR) 10 MG tablet Take 1 tablet (10 mg total) by mouth at bedtime. 01/25/23   Allwardt, Alyssa M, PA-C  VITAMIN D PO Take 1 capsule by mouth daily.    [provider]      Allergies    Augmentin [amoxicillin-pot clavulanate], Quinapril-hydrochlorothiazide, Tramadol, and Silicone    Review of Systems   Review of Systems  Constitutional:  Negative for chills and fever.  HENT:  Negative for ear pain and sore throat.   Eyes:  Negative for pain and visual disturbance.  Respiratory:  Positive for shortness of breath and wheezing. Negative for cough.   Cardiovascular:  Negative for chest pain and palpitations.  Gastrointestinal:  Negative for abdominal pain and vomiting.  Genitourinary:  Negative for dysuria and hematuria.  Musculoskeletal:  Negative for arthralgias and back pain.  Skin:  Negative for color change and rash.  Neurological:  Negative for seizures and syncope.  All  other systems reviewed and are negative.   Physical Exam Updated Vital Signs BP 131/68 (BP Location: Right Arm)   Pulse (!) 111   Temp (!) 97.5 F (36.4 C)   Resp 18   Ht 5' (1.524 m)   Wt 93.9 kg   SpO2 100%   BMI 40.43 kg/m  Physical Exam Vitals and nursing note reviewed.  Constitutional:      General: She is not in acute distress.    Appearance: She is well-developed.  HENT:     Head: Normocephalic and atraumatic.  Eyes:     Conjunctiva/sclera: Conjunctivae normal.  Cardiovascular:     Rate and Rhythm: Normal rate and regular rhythm.     Heart  sounds: No murmur heard. Pulmonary:     Effort: Pulmonary effort is normal. No respiratory distress.     Breath sounds: Examination of the right-upper field reveals wheezing. Examination of the left-upper field reveals wheezing. Examination of the right-middle field reveals wheezing. Examination of the left-middle field reveals wheezing. Examination of the right-lower field reveals wheezing. Examination of the left-lower field reveals wheezing. Wheezing present.  Abdominal:     Palpations: Abdomen is soft.     Tenderness: There is no abdominal tenderness.  Musculoskeletal:        General: No swelling.     Cervical back: Neck supple.  Skin:    General: Skin is warm and dry.     Capillary Refill: Capillary refill takes less than 2 seconds.  Neurological:     Mental Status: She is alert.  Psychiatric:        Mood and Affect: Mood normal.     ED Results / Procedures / Treatments   Labs (all labs ordered are listed, but only abnormal results are displayed) Labs Reviewed  CBC WITH DIFFERENTIAL/PLATELET - Abnormal; Notable for the following components:      Result Value   RBC 3.43 (*)    Hemoglobin 11.7 (*)    HCT 34.9 (*)    MCV 101.7 (*)    MCH 34.1 (*)    All other components within normal limits  COMPREHENSIVE METABOLIC PANEL - Abnormal; Notable for the following components:   BUN 27 (*)    Creatinine, Ser 1.29 (*)    Calcium 8.6 (*)    GFR, Estimated 43 (*)    All other components within normal limits  D-DIMER, QUANTITATIVE - Abnormal; Notable for the following components:   D-Dimer, Quant 1.60 (*)    All other components within normal limits  BRAIN NATRIURETIC PEPTIDE  TROPONIN I (HIGH SENSITIVITY)    EKG EKG Interpretation Date/Time:  Tuesday May 03 2023 21:02:30 EDT Ventricular Rate:  107 PR Interval:  144 QRS Duration:  84 QT Interval:  318 QTC Calculation: 424 R Axis:   52  Text Interpretation: Sinus tachycardia Otherwise normal ECG When compared with ECG of  24-Oct-2022 08:07, No significant change was found Confirmed by Edwin Dada ([id number]) on 05/03/2023 11:36:04 PM  Radiology DG Chest Portable 1 View  Result Date: 05/03/2023 CLINICAL DATA:  Shortness of breath, dizziness, headache EXAM: PORTABLE CHEST 1 VIEW COMPARISON:  10/24/2022 FINDINGS: Heart and mediastinal contours are within normal limits. No focal opacities or effusions. No acute bony abnormality. Bilateral shoulder replacements. IMPRESSION: No active disease. Electronically Signed   By: Charlett Nose M.D.   On: 05/03/2023 23:18    Procedures Procedures    Medications Ordered in ED Medications  ipratropium-albuterol (DUONEB) 0.5-2.5 (3) MG/3ML nebulizer solution 3 mL (3 mLs  Nebulization Given 05/03/23 2311)    ED Course/ Medical Decision Making/ A&P                             Medical Decision Making Amount and/or Complexity of Data Reviewed Labs: ordered. Radiology: ordered.  Risk Prescription drug management.   77 yo female with of hypertension, hyperlipidemia, and stroke in 2016 on plavix presenting for sob.  Patient is alert and oriented x 3, tachycardic at 111, afebrile, with stable vital signs.  No hypoxia.  Equal bilateral breath sounds with end expiratory wheezing.  DuoNebs given.  No history of asthma or COPD.  EKG demonstrates sinus tachycardia.  No ST segment elevation or depression.  Troponin stable.  Laboratory studies with no signs or symptoms of sepsis.  Chest x-ray demonstrates no acute process.  No pneumothorax.  No pleural effusions.  No pneumonia.  D-dimer elevated.  CT PE pending.  Signed out to oncoming provider while waiting CT PE study results.        Final Clinical Impression(s) / ED Diagnoses Final diagnoses:  AKI (acute kidney injury) (HCC)  SOB (shortness of breath)    Rx / DC Orders ED Discharge Orders     None         Franne Forts, DO 05/03/23 2336

## 2023-05-04 DIAGNOSIS — R42 Dizziness and giddiness: Secondary | ICD-10-CM | POA: Diagnosis not present

## 2023-05-04 DIAGNOSIS — I2510 Atherosclerotic heart disease of native coronary artery without angina pectoris: Secondary | ICD-10-CM | POA: Diagnosis not present

## 2023-05-04 DIAGNOSIS — I700 Atherosclerosis of aorta: Secondary | ICD-10-CM | POA: Diagnosis not present

## 2023-05-04 DIAGNOSIS — R059 Cough, unspecified: Secondary | ICD-10-CM | POA: Diagnosis not present

## 2023-05-04 LAB — RESP PANEL BY RT-PCR (RSV, FLU A&B, COVID)  RVPGX2
Influenza A by PCR: NEGATIVE
Influenza B by PCR: NEGATIVE
Resp Syncytial Virus by PCR: NEGATIVE
SARS Coronavirus 2 by RT PCR: NEGATIVE

## 2023-05-04 LAB — TROPONIN I (HIGH SENSITIVITY): Troponin I (High Sensitivity): 5 ng/L (ref <18)

## 2023-05-04 MED ORDER — ALBUTEROL SULFATE HFA 108 (90 BASE) MCG/ACT IN AERS: 1.0000 | INHALATION_SPRAY | Freq: Four times a day (QID) | RESPIRATORY_TRACT | 0 refills | Status: DC | PRN

## 2023-05-05 DIAGNOSIS — R002 Palpitations: Secondary | ICD-10-CM | POA: Diagnosis not present

## 2023-05-10 ENCOUNTER — Ambulatory Visit: Payer: Medicare HMO | Admitting: Physician Assistant

## 2023-05-11 ENCOUNTER — Ambulatory Visit (INDEPENDENT_AMBULATORY_CARE_PROVIDER_SITE_OTHER): Payer: Medicare HMO | Admitting: Physician Assistant

## 2023-05-11 ENCOUNTER — Encounter: Payer: Self-pay | Admitting: Physician Assistant

## 2023-05-11 ENCOUNTER — Telehealth: Payer: Self-pay

## 2023-05-11 VITALS — BP 140/80 | HR 84 | Temp 98.0°F | Wt 213.6 lb

## 2023-05-11 DIAGNOSIS — N179 Acute kidney failure, unspecified: Secondary | ICD-10-CM

## 2023-05-11 DIAGNOSIS — R062 Wheezing: Secondary | ICD-10-CM

## 2023-05-11 LAB — BASIC METABOLIC PANEL
BUN: 17 mg/dL (ref 6–23)
CO2: 25 mEq/L (ref 19–32)
Calcium: 9.2 mg/dL (ref 8.4–10.5)
Chloride: 109 mEq/L (ref 96–112)
Creatinine, Ser: 1 mg/dL (ref 0.40–1.20)
GFR: 54.91 mL/min — ABNORMAL LOW (ref >60.00)
Glucose, Bld: 91 mg/dL (ref 70–99)
Potassium: 4.5 mEq/L (ref 3.5–5.1)
Sodium: 141 mEq/L (ref 135–145)

## 2023-05-11 NOTE — Patient Instructions (Signed)
Very good to see you today.  We can recheck your kidney function panel today.  Please try to stay well-hydrated.  Use the albuterol every 4-6 hours as needed if any further wheezing.  I am messaging your cardiologist to talk about updating your echocardiogram and will get back with you on this.

## 2023-05-11 NOTE — Telephone Encounter (Signed)
Transition Care Management Unsuccessful Follow-up Telephone Call  Date of discharge and from where:  [facility] 7/24  Attempts:  1st Attempt  Reason for unsuccessful TCM follow-up call:  No answer/busy   Lenard Forth [facility] Guide, [facility] [phone] [address] [place], [place], [place] [address] Phone: [phone] Email: [email or web address]

## 2023-05-11 NOTE — Telephone Encounter (Signed)
Transition Care Management Unsuccessful Follow-up Telephone Call  Date of discharge and from where:  [facility] 7/24  Attempts:  2nd Attempt  Reason for unsuccessful TCM follow-up call:  No answer/busy   Amy Ray [facility] Guide, [facility] [phone] [address] [place], [place], [place] [address] Phone: [phone] Email: [email or web address]

## 2023-05-11 NOTE — Progress Notes (Signed)
Subjective:    Patient ID: Amy Ray, female    DOB: 11-Oct-1946, 77 y.o.   MRN: [id number]  Chief Complaint  Patient presents with   Follow-up    Follow up recent ED visit     HPI Patient is in today for most recent ER visit from 05/03/2023 for acute kidney injury and wheezing.  Here with daughter today.   She was treated with DuoNeb solution while in the ER.  Her D-dimer was elevated.  CTA of the chest was ordered and there was no pulmonary embolus.  It did show evidence of coronary artery disease and aortic atherosclerosis, which is already known. Discharged in stable condition with albuterol rescue inhaler.   Pt reports drinks plenty of water. Denies use of any OTC products.   Pt says she feels fine today, sometimes has intermittent wheezing still. Occasional bilateral lower leg swelling. Feels mostly wheezing at night, says if she gets up to use bathroom notices it more. No CP or SOB.  Usually takes Lasix 20 mg at least once per day.  Last ECHO in 2018.   Past Medical History:  Diagnosis Date   Alzheimers disease (HCC)    Anticoagulant long-term use    plavix--- manged by pcp   Chronic constipation    Diverticulosis of colon    Generalized abdominal pain    Glaucoma, both eyes    Hemorrhoids    History of CVA with residual deficit 03/16/2005   left facial paresthesia;  per pt note from stroke but in neurologist note is cva residual   History of diverticulitis of colon 05/20/2015   History of lower GI bleeding    Hyperlipidemia, mixed    Hypertension    per previous had nuclear stress test w/ cardiologist w/ dr Juliann Pares , results in care everywhere , NUC 02-01-2017  normal , ef 60%;  echo 02-02-2017 ef 50% mild AR/TR; and normal stress echo   MCI (mild cognitive impairment)    neurology--- Marlowe Kays PA ([facility]);  per note seconardy to vascular and alzeimer disease;  per MRI moderate to extensive CSV ischemia   Nocturia    OA (osteoarthritis)    PMB  (postmenopausal bleeding)    Thickened endometrium    Trigeminal neuralgia of left side of face    per pt started after stroke   Vertigo    Wears partial dentures    upper    Past Surgical History:  Procedure Laterality Date   CATARACT EXTRACTION W/ INTRAOCULAR LENS IMPLANT Bilateral    right 2023;  left , yrs ago   CHOLECYSTECTOMY, LAPAROSCOPIC  2000   HYSTEROSCOPY WITH D & C N/A 12/20/2022   Procedure: DILATATION AND CURETTAGE;  Surgeon: Lyn Henri, MD;  Location: [facility];  Service: Gynecology;  Laterality: N/A;   REVERSE SHOULDER ARTHROPLASTY Bilateral    right 06-25-2022 in [place];   approx.  2017  left side in [place]   TOTAL KNEE ARTHROPLASTY Bilateral    2004;  2005   UMBILICAL HERNIA REPAIR     child    Family History  Problem Relation Age of Onset   Stroke Mother    Obesity Mother    Hypertension Mother    Alzheimer's disease Father    Diabetes Father    Hypertension Sister    Hypertension Sister    Hypertension Sister    Hypertension Sister    Hypertension Sister    Breast cancer Sister 26   Diabetes Brother  Breast cancer Maternal Aunt 34   Breast cancer Maternal Aunt     Social History   Tobacco Use   Smoking status: Never   Smokeless tobacco: Never  Vaping Use   Vaping status: Never Used  Substance Use Topics   Alcohol use: No    Alcohol/week: 0.0 standard drinks of alcohol   Drug use: Never     Allergies  Allergen Reactions   Augmentin [Amoxicillin-Pot Clavulanate] Hives   Quinapril-Hydrochlorothiazide Other (See Comments) and Cough    Other reaction(s): Headache     Tramadol     Dizzy, headache, nosebleed, near-syncope   Silicone Rash    Review of Systems NEGATIVE UNLESS OTHERWISE INDICATED IN HPI      Objective:     BP (!) 140/80 (BP Location: Right Arm, Patient Position: Sitting, Cuff Size: Large)   Pulse 84   Temp 98 F (36.7 C)   Wt 213 lb 9.6 oz (96.9 kg)   SpO2 99%   BMI 41.72 kg/m   Wt  Readings from Last 3 Encounters:  05/11/23 213 lb 9.6 oz (96.9 kg)  05/03/23 207 lb (93.9 kg)  03/16/23 210 lb 6.4 oz (95.4 kg)    BP Readings from Last 3 Encounters:  05/11/23 (!) 140/80  05/04/23 133/66  03/16/23 122/78     Physical Exam Vitals and nursing note reviewed.  Constitutional:      Appearance: Normal appearance. She is obese.  Eyes:     Extraocular Movements: Extraocular movements intact.     Conjunctiva/sclera: Conjunctivae normal.     Pupils: Pupils are equal, round, and reactive to light.  Cardiovascular:     Rate and Rhythm: Normal rate and regular rhythm.     Pulses: Normal pulses.     Heart sounds: No murmur heard. Pulmonary:     Effort: Pulmonary effort is normal.     Breath sounds: Normal breath sounds. No wheezing or rhonchi.  Musculoskeletal:     Right lower leg: 1+ Edema present.     Left lower leg: 1+ Edema present.  Neurological:     Mental Status: She is alert.  Psychiatric:        Mood and Affect: Mood normal.        Behavior: Behavior normal.        Assessment & Plan:  AKI (acute kidney injury) (HCC) -     Basic metabolic panel  Wheezing  I personally reviewed patient's most recent ER visit notes, labs, imaging.  She is feeling much better today.  Plan to recheck BMP and make sure her kidney function is improving.  She may need to hold her Lasix pending results.  Encouraged her to stay well-hydrated.  She can use her rescue inhaler every 4-6 hours as needed for wheeze.  With complaints mostly in the evening times, considering updating her echocardiogram.  I will consult with her cardiologist on this.  Return to care and ER precautions discussed.  Patient and daughter both agreeable with plan.      Return if symptoms worsen or fail to improve.  This note was prepared with assistance of Conservation officer, historic buildings. Occasional wrong-word or sound-a-like substitutions may have occurred due to the inherent limitations of voice  recognition software.    Bonniejean Piano M Siraj Dermody, PA-C

## 2023-05-12 ENCOUNTER — Other Ambulatory Visit: Payer: Self-pay | Admitting: Physician Assistant

## 2023-05-12 DIAGNOSIS — R0600 Dyspnea, unspecified: Secondary | ICD-10-CM

## 2023-05-13 ENCOUNTER — Other Ambulatory Visit: Payer: Self-pay | Admitting: Physician Assistant

## 2023-05-13 ENCOUNTER — Other Ambulatory Visit: Payer: Self-pay | Admitting: *Deleted

## 2023-05-13 DIAGNOSIS — Z1231 Encounter for screening mammogram for malignant neoplasm of breast: Secondary | ICD-10-CM

## 2023-05-23 ENCOUNTER — Telehealth (HOSPITAL_BASED_OUTPATIENT_CLINIC_OR_DEPARTMENT_OTHER): Payer: Self-pay | Admitting: *Deleted

## 2023-05-23 ENCOUNTER — Other Ambulatory Visit (HOSPITAL_BASED_OUTPATIENT_CLINIC_OR_DEPARTMENT_OTHER): Payer: Medicare HMO

## 2023-05-23 NOTE — Telephone Encounter (Signed)
Spoke with patient regarding new appointment date and time for thecancelled (tech is out) 05/23/23 Echocardiogram  New date and time 06/09/23 at 2:00 pm here a DWB---patient voiced her understanding

## 2023-06-09 ENCOUNTER — Ambulatory Visit (INDEPENDENT_AMBULATORY_CARE_PROVIDER_SITE_OTHER): Payer: Medicare HMO

## 2023-06-09 DIAGNOSIS — R0600 Dyspnea, unspecified: Secondary | ICD-10-CM | POA: Diagnosis not present

## 2023-06-09 LAB — ECHOCARDIOGRAM COMPLETE
Area-P 1/2: 5.13 cm2
P 1/2 time: 238 msec
S' Lateral: 2.4 cm

## 2023-06-21 ENCOUNTER — Ambulatory Visit
Admission: RE | Admit: 2023-06-21 | Discharge: 2023-06-21 | Disposition: A | Discharge status: Home / Self Care | Payer: Medicare HMO | Source: Ambulatory Visit | Attending: Physician Assistant | Admitting: Physician Assistant

## 2023-06-21 DIAGNOSIS — Z1231 Encounter for screening mammogram for malignant neoplasm of breast: Secondary | ICD-10-CM

## 2023-07-03 ENCOUNTER — Emergency Department (HOSPITAL_BASED_OUTPATIENT_CLINIC_OR_DEPARTMENT_OTHER)
Admission: EM | Admit: 2023-07-03 | Discharge: 2023-07-03 | Disposition: A | Discharge status: Home / Self Care | Payer: Medicare HMO | Attending: Emergency Medicine | Admitting: Emergency Medicine

## 2023-07-03 ENCOUNTER — Emergency Department (HOSPITAL_BASED_OUTPATIENT_CLINIC_OR_DEPARTMENT_OTHER): Payer: Medicare HMO | Admitting: Radiology

## 2023-07-03 ENCOUNTER — Encounter (HOSPITAL_BASED_OUTPATIENT_CLINIC_OR_DEPARTMENT_OTHER): Payer: Self-pay | Admitting: Emergency Medicine

## 2023-07-03 ENCOUNTER — Emergency Department (HOSPITAL_BASED_OUTPATIENT_CLINIC_OR_DEPARTMENT_OTHER): Payer: Medicare HMO

## 2023-07-03 DIAGNOSIS — R062 Wheezing: Secondary | ICD-10-CM | POA: Diagnosis not present

## 2023-07-03 DIAGNOSIS — G309 Alzheimer's disease, unspecified: Secondary | ICD-10-CM | POA: Diagnosis not present

## 2023-07-03 DIAGNOSIS — Z7982 Long term (current) use of aspirin: Secondary | ICD-10-CM | POA: Diagnosis not present

## 2023-07-03 DIAGNOSIS — I10 Essential (primary) hypertension: Secondary | ICD-10-CM | POA: Diagnosis not present

## 2023-07-03 DIAGNOSIS — R0602 Shortness of breath: Secondary | ICD-10-CM | POA: Insufficient documentation

## 2023-07-03 DIAGNOSIS — Z96611 Presence of right artificial shoulder joint: Secondary | ICD-10-CM | POA: Diagnosis not present

## 2023-07-03 DIAGNOSIS — Z7901 Long term (current) use of anticoagulants: Secondary | ICD-10-CM | POA: Insufficient documentation

## 2023-07-03 DIAGNOSIS — Z96612 Presence of left artificial shoulder joint: Secondary | ICD-10-CM | POA: Diagnosis not present

## 2023-07-03 DIAGNOSIS — I2699 Other pulmonary embolism without acute cor pulmonale: Secondary | ICD-10-CM | POA: Diagnosis not present

## 2023-07-03 LAB — CBC WITH DIFFERENTIAL/PLATELET
Abs Immature Granulocytes: 0.01 10*3/uL (ref 0.00–0.07)
Basophils Absolute: 0 10*3/uL (ref 0.0–0.1)
Basophils Relative: 1 %
Eosinophils Absolute: 0.1 10*3/uL (ref 0.0–0.5)
Eosinophils Relative: 2 %
HCT: 37.5 % (ref 36.0–46.0)
Hemoglobin: 12.5 g/dL (ref 12.0–15.0)
Immature Granulocytes: 0 %
Lymphocytes Relative: 32 %
Lymphs Abs: 1.7 10*3/uL (ref 0.7–4.0)
MCH: 33.5 pg (ref 26.0–34.0)
MCHC: 33.3 g/dL (ref 30.0–36.0)
MCV: 100.5 fL — ABNORMAL HIGH (ref 80.0–100.0)
Monocytes Absolute: 0.6 10*3/uL (ref 0.1–1.0)
Monocytes Relative: 12 %
Neutro Abs: 2.8 10*3/uL (ref 1.7–7.7)
Neutrophils Relative %: 53 %
Platelets: 154 10*3/uL (ref 150–400)
RBC: 3.73 MIL/uL — ABNORMAL LOW (ref 3.87–5.11)
RDW: 12.4 % (ref 11.5–15.5)
WBC: 5.2 10*3/uL (ref 4.0–10.5)
nRBC: 0 % (ref 0.0–0.2)

## 2023-07-03 LAB — COMPREHENSIVE METABOLIC PANEL
ALT: 22 U/L (ref 0–44)
AST: 26 U/L (ref 15–41)
Albumin: 4.2 g/dL (ref 3.5–5.0)
Alkaline Phosphatase: 125 U/L (ref 38–126)
Anion gap: 9 (ref 5–15)
BUN: 21 mg/dL (ref 8–23)
CO2: 25 mmol/L (ref 22–32)
Calcium: 9.2 mg/dL (ref 8.9–10.3)
Chloride: 107 mmol/L (ref 98–111)
Creatinine, Ser: 0.9 mg/dL (ref 0.44–1.00)
GFR, Estimated: >60 mL/min (ref >60)
Glucose, Bld: 90 mg/dL (ref 70–99)
Potassium: 3.7 mmol/L (ref 3.5–5.1)
Sodium: 141 mmol/L (ref 135–145)
Total Bilirubin: 0.4 mg/dL (ref 0.3–1.2)
Total Protein: 8 g/dL (ref 6.5–8.1)

## 2023-07-03 LAB — BRAIN NATRIURETIC PEPTIDE: B Natriuretic Peptide: 43.3 pg/mL (ref 0.0–100.0)

## 2023-07-03 LAB — D-DIMER, QUANTITATIVE: D-Dimer, Quant: 1.74 ug/mL-FEU — ABNORMAL HIGH (ref 0.00–0.50)

## 2023-07-03 LAB — TROPONIN I (HIGH SENSITIVITY): Troponin I (High Sensitivity): 6 ng/L (ref <18)

## 2023-07-03 MED ORDER — IOHEXOL 350 MG/ML SOLN
75.0000 mL | Freq: Once | INTRAVENOUS | Status: AC | PRN
  Administered 2023-07-03: 100 mL via INTRAVENOUS

## 2023-07-03 MED ORDER — IPRATROPIUM-ALBUTEROL 0.5-2.5 (3) MG/3ML IN SOLN
3.0000 mL | Freq: Once | RESPIRATORY_TRACT | Status: AC
  Administered 2023-07-03: 3 mL via RESPIRATORY_TRACT
  Filled 2023-07-03: qty 3

## 2023-07-03 MED ORDER — AEROCHAMBER PLUS FLO-VU MISC
1.0000 | Freq: Once | Status: AC
  Administered 2023-07-03: 1
  Filled 2023-07-03: qty 1

## 2023-07-03 MED ORDER — ALBUTEROL SULFATE HFA 108 (90 BASE) MCG/ACT IN AERS
2.0000 | INHALATION_SPRAY | Freq: Once | RESPIRATORY_TRACT | Status: AC
  Administered 2023-07-03: 2 via RESPIRATORY_TRACT
  Filled 2023-07-03: qty 6.7

## 2023-07-03 NOTE — ED Notes (Signed)
ED Provider at bedside. 

## 2023-07-03 NOTE — ED Notes (Signed)
Dc instructions reviewed with patient. Patient voiced understanding. Dc with belongings.  °

## 2023-07-03 NOTE — ED Triage Notes (Signed)
Pt c/o shob and wheezing. Speaking in complete sentences

## 2023-07-03 NOTE — ED Notes (Signed)
D dimer sent to lab at this time

## 2023-07-03 NOTE — ED Provider Notes (Signed)
[place] EMERGENCY DEPARTMENT AT [facility] Provider Note   CSN: [id number] Arrival date & time: 07/03/23  8657     History {Add pertinent medical, surgical, social history, OB history to HPI:1} Chief Complaint  Patient presents with   Shortness of Breath    Amy Ray is a 77 y.o. female.  HPI     77 year old female with a history of Alzheimer's, CVA, hypertension, hyperlipidemia, on Plavix, prior ED visit for dyspnea/wheezing who presents with concern for shortness of breath.  Reports she has had wheezing intermittently since February, came to the emergency department previously for shortness of breath.  Reports she has had increasing shortness of breath over the last 2 months, and describes orthopnea and paroxysmal nocturnal dyspnea.  She has had chronic leg swelling that is not worse.  She has been prescribed Lasix as an outpatient for leg swelling that was thought to be due to  Past Medical History:  Diagnosis Date   Alzheimers disease (HCC)    Anticoagulant long-term use    plavix--- manged by pcp   Chronic constipation    Diverticulosis of colon    Generalized abdominal pain    Glaucoma, both eyes    Hemorrhoids    History of CVA with residual deficit 03/16/2005   left facial paresthesia;  per pt note from stroke but in neurologist note is cva residual   History of diverticulitis of colon 05/20/2015   History of lower GI bleeding    Hyperlipidemia, mixed    Hypertension    per previous had nuclear stress test w/ cardiologist w/ dr Juliann Pares , results in care everywhere , NUC 02-01-2017  normal , ef 60%;  echo 02-02-2017 ef 50% mild AR/TR; and normal stress echo   MCI (mild cognitive impairment)    neurology--- Marlowe Kays PA ([facility]);  per note seconardy to vascular and alzeimer disease;  per MRI moderate to extensive CSV ischemia   Nocturia    OA (osteoarthritis)    PMB (postmenopausal bleeding)    Thickened endometrium    Trigeminal  neuralgia of left side of face    per pt started after stroke   Vertigo    Wears partial dentures    upper    Home Medications Prior to Admission medications   Medication Sig Start Date End Date Taking? Authorizing Provider  Acetaminophen (TYLENOL EX ST ARTHRITIS PAIN PO) Take 2 tablets by mouth daily as needed (for arthritis pain).    [provider]  albuterol (VENTOLIN HFA) 108 (90 Base) MCG/ACT inhaler Inhale 1-2 puffs into the lungs every 6 (six) hours as needed for wheezing or shortness of breath. 05/04/23   Palumbo, April, MD  aspirin 81 MG tablet Take 1 tablet by mouth daily.    [provider]  carbamazepine (TEGRETOL) 200 MG tablet Take 1 tablet (200 mg total) by mouth 2 (two) times daily. 01/14/23   Allwardt, Crist Infante, PA-C  clopidogrel (PLAVIX) 75 MG tablet Take 1 tablet (75 mg total) by mouth daily. 01/14/23   Allwardt, Crist Infante, PA-C  diltiazem (DILACOR XR) 180 MG 24 hr capsule TAKE 2 CAPSULES EVERY DAY 04/08/23   Allwardt, Alyssa M, PA-C  famotidine (PEPCID) 40 MG tablet Take 1 tablet (40 mg total) by mouth at bedtime. 01/14/23   Allwardt, Crist Infante, PA-C  furosemide (LASIX) 20 MG tablet Take 1 tablet (20 mg total) by mouth daily. 01/21/23   Allwardt, Crist Infante, PA-C  nortriptyline (PAMELOR) 10 MG capsule Take 1 capsule (10 mg  total) by mouth at bedtime. TAKE 1 TO 2 CAPSULES BY MOUTH AT BEDTIME Strength: 10 mg 02/08/23   Allwardt, Alyssa M, PA-C  polyethylene glycol (MIRALAX / GLYCOLAX) 17 g packet Take 17 g by mouth 2 (two) times daily. Patient taking differently: Take 17 g by mouth daily as needed for mild constipation. 08/03/22   Charlynne Pander, MD  SIMBRINZA 1-0.2 % SUSP Place 1 drop into both eyes 2 (two) times daily. Patient not taking: Reported on 05/11/2023 05/27/22   [provider]  simvastatin (ZOCOR) 10 MG tablet Take 1 tablet (10 mg total) by mouth at bedtime. 01/25/23   Allwardt, Alyssa M, PA-C  VITAMIN D PO Take 1 capsule by mouth daily.     [provider]      Allergies    Augmentin [amoxicillin-pot clavulanate], Quinapril-hydrochlorothiazide, Tramadol, and Silicone    Review of Systems   Review of Systems  Physical Exam Updated Vital Signs BP (!) 152/85   Pulse (!) 106   Temp 98.7 F (37.1 C) (Oral)   Resp 18   SpO2 100%  Physical Exam  ED Results / Procedures / Treatments   Labs (all labs ordered are listed, but only abnormal results are displayed) Labs Reviewed  CBC WITH DIFFERENTIAL/PLATELET  COMPREHENSIVE METABOLIC PANEL  BRAIN NATRIURETIC PEPTIDE  D-DIMER, QUANTITATIVE  TROPONIN I (HIGH SENSITIVITY)    EKG None  Radiology DG Chest 2 View  Result Date: 07/03/2023 CLINICAL DATA:  Shortness of breath.  Wheezing. EXAM: CHEST - 2 VIEW COMPARISON:  05/03/2023. FINDINGS: Bilateral lung fields are clear. Bilateral costophrenic angles are clear. Normal cardio-mediastinal silhouette. No acute osseous abnormalities. Note is made of bilateral shoulder arthroplasties. The soft tissues are within normal limits. IMPRESSION: *No acute cardiopulmonary process. Electronically Signed   By: Jules Schick M.D.   On: 07/03/2023 08:50    Procedures Procedures  {Document cardiac monitor, telemetry assessment procedure when appropriate:1}  Medications Ordered in ED Medications  ipratropium-albuterol (DUONEB) 0.5-2.5 (3) MG/3ML nebulizer solution 3 mL (3 mLs Nebulization Given 07/03/23 0856)    ED Course/ Medical Decision Making/ A&P   {   Click here for ABCD2, HEART and other calculatorsREFRESH Note before signing :1}                               77 year old female with a history of Alzheimer's, CVA, hypertension, hyperlipidemia, on Plavix, prior ED visit for dyspnea/wheezing who presents with concern for shortness of breath.  EKG completed and personally about interpreted by me shows normal sinus rhythm without acute ST changes.  Chest x-ray was completed and shows no evidence of pneumonia, pneumothorax  or pulmonary edema  {Document critical care time when appropriate:1} {Document review of labs and clinical decision tools ie heart score, Chads2Vasc2 etc:1}  {Document your independent review of radiology images, and any outside records:1} {Document your discussion with family members, caretakers, and with consultants:1} {Document social determinants of health affecting pt's care:1} {Document your decision making why or why not admission, treatments were needed:1} Final Clinical Impression(s) / ED Diagnoses Final diagnoses:  None    Rx / DC Orders ED Discharge Orders     None

## 2023-07-03 NOTE — ED Notes (Signed)
Awaiting RT instruction on inhaler

## 2023-07-03 NOTE — ED Notes (Signed)
We are having some difficulty establishing IV access. My colleague is attempting u/s-guided IV start as I write this. Pt. Is breathing normally and tells me she is "breathing better".

## 2023-07-03 NOTE — ED Notes (Signed)
Patient transported to CT 

## 2023-07-18 DIAGNOSIS — H4062X1 Glaucoma secondary to drugs, left eye, mild stage: Secondary | ICD-10-CM | POA: Diagnosis not present

## 2023-07-18 DIAGNOSIS — H40011 Open angle with borderline findings, low risk, right eye: Secondary | ICD-10-CM | POA: Diagnosis not present

## 2023-07-18 DIAGNOSIS — H04123 Dry eye syndrome of bilateral lacrimal glands: Secondary | ICD-10-CM | POA: Diagnosis not present

## 2023-07-18 DIAGNOSIS — H35363 Drusen (degenerative) of macula, bilateral: Secondary | ICD-10-CM | POA: Diagnosis not present

## 2023-07-31 ENCOUNTER — Other Ambulatory Visit: Payer: Self-pay

## 2023-07-31 ENCOUNTER — Encounter (HOSPITAL_BASED_OUTPATIENT_CLINIC_OR_DEPARTMENT_OTHER): Payer: Self-pay | Admitting: Emergency Medicine

## 2023-07-31 ENCOUNTER — Emergency Department (HOSPITAL_BASED_OUTPATIENT_CLINIC_OR_DEPARTMENT_OTHER)
Admission: EM | Admit: 2023-07-31 | Discharge: 2023-08-01 | Disposition: A | Discharge status: Home / Self Care | Payer: Medicare HMO | Attending: Emergency Medicine | Admitting: Emergency Medicine

## 2023-07-31 ENCOUNTER — Emergency Department (HOSPITAL_BASED_OUTPATIENT_CLINIC_OR_DEPARTMENT_OTHER): Payer: Medicare HMO

## 2023-07-31 DIAGNOSIS — Z79899 Other long term (current) drug therapy: Secondary | ICD-10-CM | POA: Insufficient documentation

## 2023-07-31 DIAGNOSIS — Z7902 Long term (current) use of antithrombotics/antiplatelets: Secondary | ICD-10-CM | POA: Insufficient documentation

## 2023-07-31 DIAGNOSIS — R0600 Dyspnea, unspecified: Secondary | ICD-10-CM | POA: Insufficient documentation

## 2023-07-31 DIAGNOSIS — R0602 Shortness of breath: Secondary | ICD-10-CM | POA: Diagnosis not present

## 2023-07-31 DIAGNOSIS — Z8673 Personal history of transient ischemic attack (TIA), and cerebral infarction without residual deficits: Secondary | ICD-10-CM | POA: Insufficient documentation

## 2023-07-31 DIAGNOSIS — Z7982 Long term (current) use of aspirin: Secondary | ICD-10-CM | POA: Diagnosis not present

## 2023-07-31 DIAGNOSIS — I10 Essential (primary) hypertension: Secondary | ICD-10-CM | POA: Diagnosis not present

## 2023-07-31 DIAGNOSIS — R062 Wheezing: Secondary | ICD-10-CM | POA: Diagnosis not present

## 2023-07-31 NOTE — ED Notes (Signed)
Patient reports intermittent shortness of breath and wheezing since February. Patient reports shortness of breath is worse when she is lying down. Patient denies hx of asthma. Patient denies any other associated symptoms.

## 2023-07-31 NOTE — ED Notes (Signed)
RT assessed pt in triage for SOB. Pt respiratory status stable on RA w/no distress noted at this time. PTs BLBS clear/dim w/some dyspnea per pt.     07/31/23 2213  Therapy Vitals  Temp 98.5 F (36.9 C)  Temp Source Oral  Pulse Rate 100  Resp 19  BP 133/81  Patient Position (if appropriate) Sitting  MEWS Score/Color  MEWS Score 0  MEWS Score Color Green  Respiratory Assessment  Assessment Type Assess only  Respiratory Pattern Regular;Unlabored;Dyspnea at rest;Symmetrical  Chest Assessment Chest expansion symmetrical  Cough None  Bilateral Breath Sounds Clear;Diminished  R Upper  Breath Sounds Clear  L Upper Breath Sounds Clear  R Lower Breath Sounds Diminished  L Lower Breath Sounds Diminished  Oxygen Therapy/Pulse Ox  O2 Device Room Air  O2 Therapy Room air  SpO2 100 %

## 2023-07-31 NOTE — ED Triage Notes (Signed)
C/o sob and wheezing since last night.

## 2023-08-01 MED ORDER — ALBUTEROL SULFATE HFA 108 (90 BASE) MCG/ACT IN AERS
2.0000 | INHALATION_SPRAY | Freq: Once | RESPIRATORY_TRACT | Status: AC
  Administered 2023-08-01: 2 via RESPIRATORY_TRACT
  Filled 2023-08-01: qty 6.7

## 2023-08-01 MED ORDER — AEROCHAMBER PLUS FLO-VU MISC
1.0000 | Freq: Once | Status: DC
  Filled 2023-08-01: qty 1

## 2023-08-01 NOTE — Discharge Instructions (Signed)
Use the albuterol inhaler 2 puffs every 4 hours as needed for wheezing.  Follow-up with pulmonology as previously recommended.

## 2023-08-01 NOTE — ED Provider Notes (Signed)
[place] EMERGENCY DEPARTMENT AT [facility] Provider Note   CSN: [id number] Arrival date & time: 07/31/23  2205     History  Chief Complaint  Patient presents with   Shortness of Breath    Amy Ray is a 77 y.o. female.  Patient is a 77 year old female with past medical history of prior CVA, hypertension, osteoarthritis with joint replacements.  Patient has been experiencing episodes of wheezing for the past 8 months.  She has been to the ER and her primary doctor for similar episodes.  This evening, she began having difficulty breathing and wheezing.  She has been given an inhaler in the past, however she has run out of this.  She denies any fevers, chills, or cough.  No chest pain.  No leg swelling.  The history is provided by the patient.       Home Medications Prior to Admission medications   Medication Sig Start Date End Date Taking? Authorizing Provider  Acetaminophen (TYLENOL EX ST ARTHRITIS PAIN PO) Take 2 tablets by mouth daily as needed (for arthritis pain).    [provider]  albuterol (VENTOLIN HFA) 108 (90 Base) MCG/ACT inhaler Inhale 1-2 puffs into the lungs every 6 (six) hours as needed for wheezing or shortness of breath. 05/04/23   Palumbo, April, MD  aspirin 81 MG tablet Take 1 tablet by mouth daily.    [provider]  carbamazepine (TEGRETOL) 200 MG tablet Take 1 tablet (200 mg total) by mouth 2 (two) times daily. 01/14/23   Allwardt, Crist Infante, PA-C  clopidogrel (PLAVIX) 75 MG tablet Take 1 tablet (75 mg total) by mouth daily. 01/14/23   Allwardt, Crist Infante, PA-C  diltiazem (DILACOR XR) 180 MG 24 hr capsule TAKE 2 CAPSULES EVERY DAY 04/08/23   Allwardt, Alyssa M, PA-C  famotidine (PEPCID) 40 MG tablet Take 1 tablet (40 mg total) by mouth at bedtime. 01/14/23   Allwardt, Crist Infante, PA-C  furosemide (LASIX) 20 MG tablet Take 1 tablet (20 mg total) by mouth daily. 01/21/23   Allwardt, Crist Infante, PA-C  nortriptyline (PAMELOR) 10 MG capsule  Take 1 capsule (10 mg total) by mouth at bedtime. TAKE 1 TO 2 CAPSULES BY MOUTH AT BEDTIME Strength: 10 mg 02/08/23   Allwardt, Alyssa M, PA-C  polyethylene glycol (MIRALAX / GLYCOLAX) 17 g packet Take 17 g by mouth 2 (two) times daily. Patient taking differently: Take 17 g by mouth daily as needed for mild constipation. 08/03/22   Charlynne Pander, MD  SIMBRINZA 1-0.2 % SUSP Place 1 drop into both eyes 2 (two) times daily. Patient not taking: Reported on 05/11/2023 05/27/22   [provider]  simvastatin (ZOCOR) 10 MG tablet Take 1 tablet (10 mg total) by mouth at bedtime. 01/25/23   Allwardt, Alyssa M, PA-C  VITAMIN D PO Take 1 capsule by mouth daily.    [provider]      Allergies    Augmentin [amoxicillin-pot clavulanate], Quinapril-hydrochlorothiazide, Tramadol, and Silicone    Review of Systems   Review of Systems  All other systems reviewed and are negative.   Physical Exam Updated Vital Signs BP 137/66   Pulse 73   Temp 98.5 F (36.9 C) (Oral)   Resp 16   SpO2 99%  Physical Exam Vitals and nursing note reviewed.  Constitutional:      General: She is not in acute distress.    Appearance: She is well-developed. She is not diaphoretic.  HENT:     Head: Normocephalic  and atraumatic.  Cardiovascular:     Rate and Rhythm: Normal rate and regular rhythm.     Heart sounds: No murmur heard.    No friction rub. No gallop.  Pulmonary:     Effort: Pulmonary effort is normal. No respiratory distress.     Breath sounds: Normal breath sounds. No wheezing.  Abdominal:     General: Bowel sounds are normal. There is no distension.     Palpations: Abdomen is soft.     Tenderness: There is no abdominal tenderness.  Musculoskeletal:        General: Normal range of motion.     Cervical back: Normal range of motion and neck supple.  Skin:    General: Skin is warm and dry.  Neurological:     General: No focal deficit present.     Mental Status: She is alert and  oriented to person, place, and time.     ED Results / Procedures / Treatments   Labs (all labs ordered are listed, but only abnormal results are displayed) Labs Reviewed - No data to display  EKG None  Radiology DG Chest [facility] 1 View  Result Date: 07/31/2023 CLINICAL DATA:  Shortness of breath and wheezing since last night. EXAM: PORTABLE CHEST 1 VIEW COMPARISON:  07/03/2023 FINDINGS: Patient is rotated. Heart size and pulmonary vascularity are normal. Lungs are clear. No pleural effusions. No pneumothorax. Mediastinal contours appear intact. Degenerative changes in the spine. Postoperative changes in both shoulders. IMPRESSION: No active disease. Electronically Signed   By: Burman Nieves M.D.   On: 07/31/2023 23:12    Procedures Procedures    Medications Ordered in ED Medications - No data to display  ED Course/ Medical Decision Making/ A&P  Patient presenting with complaints of wheezing and a history of similar episodes that have been thus far unexplained for the past 8 months.  She has an inhaler which helps, but the inhaler is empty.  Patient arrives here with stable vital signs and is clinically well-appearing.  She has oxygen saturations of 100% and no tachypnea or respiratory distress.  Her lungs are clear on exam.  Chest x-ray today shows no acute abnormality.  Patient seems appropriate for discharge.  I will refill her inhaler and have her follow-up with pulmonology as previously recommended.  Final Clinical Impression(s) / ED Diagnoses Final diagnoses:  None    Rx / DC Orders ED Discharge Orders     None         Geoffery Lyons, MD 08/01/23 0010

## 2023-08-01 NOTE — ED Notes (Signed)
RT educated pt on proper use of MDI w/spacer. Pt able to perform without difficulty and verbalizes understanding.     08/01/23 0050  Aerosol Therapy Tx  $ Hand Held Nebulizer  1  Medications Albuterol  Delivery Device MDI (w/aerochamber)  Pre-Treatment Pulse 102  Pre-Treatment Respirations 18  Treatment Tolerance Tolerated well  Treatment Given 1  MEWS Score/Color  MEWS Score 1  MEWS Score Color Green  RT Breath Sounds  Bilateral Breath Sounds Clear;Diminished  R Upper  Breath Sounds Clear  L Upper Breath Sounds Clear  R Lower Breath Sounds Diminished  L Lower Breath Sounds Diminished  Oxygen Therapy/Pulse Ox  O2 Device Room Air  O2 Therapy Room air  SpO2 99 %

## 2023-08-02 ENCOUNTER — Telehealth: Payer: Self-pay

## 2023-08-02 NOTE — Telephone Encounter (Signed)
Spoke to patient and offered 10/23 at 8:30 with Dr. Jayme Cloud. She stated that she would need to speak with her daughter and she will call back to schedule if appt works with her daughter's schedule.   Advised patient to report to ED or UC if sx worsen in the meantime.

## 2023-08-02 NOTE — Telephone Encounter (Signed)
Appt has been scheduled for 08/03/23 with Dr. Jayme Cloud.

## 2023-08-02 NOTE — Telephone Encounter (Signed)
Patient is currently scheduled for a consultation for November the 4th. Patient's daughter would like for her to have a sooner appointment. November the 4th is the earliest availability that we have. Patient is experiencing chest pain, coughing and wheezing. She has been in and out of the emergency room.

## 2023-08-03 ENCOUNTER — Ambulatory Visit: Payer: Medicare HMO | Admitting: Pulmonary Disease

## 2023-08-03 ENCOUNTER — Other Ambulatory Visit
Admission: RE | Admit: 2023-08-03 | Discharge: 2023-08-03 | Disposition: A | Discharge status: Home / Self Care | Payer: Medicare HMO | Source: Ambulatory Visit | Attending: Pulmonary Disease | Admitting: Pulmonary Disease

## 2023-08-03 ENCOUNTER — Encounter: Payer: Self-pay | Admitting: Pulmonary Disease

## 2023-08-03 ENCOUNTER — Institutional Professional Consult (permissible substitution): Payer: Medicare HMO | Admitting: Student in an Organized Health Care Education/Training Program

## 2023-08-03 VITALS — BP 142/84 | HR 112 | Temp 99.5°F | Ht 60.0 in | Wt 214.8 lb

## 2023-08-03 DIAGNOSIS — J45998 Other asthma: Secondary | ICD-10-CM

## 2023-08-03 DIAGNOSIS — K219 Gastro-esophageal reflux disease without esophagitis: Secondary | ICD-10-CM | POA: Diagnosis not present

## 2023-08-03 DIAGNOSIS — R0602 Shortness of breath: Secondary | ICD-10-CM

## 2023-08-03 DIAGNOSIS — Z8673 Personal history of transient ischemic attack (TIA), and cerebral infarction without residual deficits: Secondary | ICD-10-CM | POA: Diagnosis not present

## 2023-08-03 MED ORDER — PREDNISONE 20 MG PO TABS: 20.0000 mg | ORAL_TABLET | Freq: Every day | ORAL | 0 refills | Status: AC

## 2023-08-03 MED ORDER — TRELEGY ELLIPTA 200-62.5-25 MCG/ACT IN AEPB: 1.0000 | INHALATION_SPRAY | Freq: Every day | RESPIRATORY_TRACT | 6 refills | Status: DC

## 2023-08-03 NOTE — Patient Instructions (Signed)
VISIT SUMMARY:  During today's visit, we discussed your recent shortness of breath and persistent cough, which have been affecting your daily activities. We also reviewed your history of leg swelling and gastroesophageal reflux disease (GERD). Several diagnostic tests have been performed, but the cause of your symptoms remains unclear. We have developed a plan to address these issues and will follow up in 4-6 weeks to assess your progress.  YOUR PLAN:  -ASTHMA: Asthma is a condition where your airways narrow and swell, producing extra mucus, which can make breathing difficult. We will conduct pulmonary function tests and an allergen panel to identify any triggers. You will start using a daily inhaler with bronchodilator and anti-inflammatory properties, pending insurance approval, and take a short course of Prednisone for 5 days to reduce inflammation.  You can still use your albuterol as "emergency" inhaler up to 4 times a day if needed.  -GASTROESOPHAGEAL REFLUX DISEASE (GERD): GERD is a digestive disorder where stomach acid irritates the food pipe lining. You are currently taking Famotidine, which provides some relief. If your symptoms persist, we may consider a stronger medication.  -LOWER EXTREMITY EDEMA: Lower extremity edema is swelling in the legs, often due to fluid retention. We encourage you to continue physical activities like walking to help reduce the swelling.  INSTRUCTIONS:  Please follow up in 4-6 weeks to assess your response to the new asthma treatment and to discuss the results of your pulmonary function tests and allergen panel.

## 2023-08-03 NOTE — Progress Notes (Signed)
Subjective:    Patient ID: Amy Ray, female    DOB: 1946-09-18, 77 y.o.   MRN: [id number]  Patient Care Team: Allwardt, Crist Infante, PA-C as PCP - General (Physician Assistant) Jake Bathe, MD as PCP - Cardiology (Cardiology) Isla Pence, OD as Consulting Physician (Optometry) Deeann Saint, MD (Orthopedic Surgery) Alwyn Pea, MD as Consulting Physician (Cardiology)  Chief Complaint  Patient presents with   Consult    SOB and wheezing when she gets out of bed. Cough with white sputum. Symptoms since Feb.    BACKGROUND: This is a 77 year old lifelong never smoker with a history as noted below, who presents for evaluation of shortness of breath present since February 2024, worse with activity and improved with albuterol.  She is kindly referred by Kristopher Glee, PA-C.  HPI Discussed the use of AI scribe software for clinical note transcription with the patient, who gave verbal consent to proceed.  History of Present Illness   The patient, with a history of hypertension, presents with a chief complaint of shortness of breath that began in February. The dyspnea is severe enough to interrupt conversation and is associated with a persistent cough. The patient reports that the symptoms worsen with physical activity, such as bending over or doing housework. She denies any chest tightness.  The patient has been using an albuterol inhaler, which provides temporary relief for about four hours. She has no history of asthma, bronchitis, or smoking. She worked in the [employer] and has no known occupational exposures.  The patient also reports leg swelling, which has improved since she started walking again. She occasionally experiences sinus headaches. She has a history of reflux and takes famotidine nightly, which seems to help control her symptoms.  The patient has known allergies to Quinapril, Augmentin, tramadol, and silicone, which cause cough, hives, dizziness, and rash,  respectively. She has no known muscle weakness or joint pain.  The patient has undergone several diagnostic tests, including an EKG, echocardiogram, and CT scan, but no pulmonary function tests. She has also had a heart monitor for two weeks. Despite these investigations, the cause of her dyspnea remains undetermined. No overt cardiac issues identified.     Review of Systems A 10 point review of systems was performed and it is as noted above otherwise negative.   Past Medical History:  Diagnosis Date   Alzheimers disease (HCC)    Anticoagulant long-term use    plavix--- manged by pcp   Chronic constipation    Diverticulosis of colon    Generalized abdominal pain    Glaucoma, both eyes    Hemorrhoids    History of CVA with residual deficit 03/16/2005   left facial paresthesia;  per pt note from stroke but in neurologist note is cva residual   History of diverticulitis of colon 05/20/2015   History of lower GI bleeding    Hyperlipidemia, mixed    Hypertension    per previous had nuclear stress test w/ cardiologist w/ dr Juliann Pares , results in care everywhere , NUC 02-01-2017  normal , ef 60%;  echo 02-02-2017 ef 50% mild AR/TR; and normal stress echo   MCI (mild cognitive impairment)    neurology--- Marlowe Kays PA ([facility]);  per note seconardy to vascular and alzeimer disease;  per MRI moderate to extensive CSV ischemia   Nocturia    OA (osteoarthritis)    PMB (postmenopausal bleeding)    Thickened endometrium    Trigeminal neuralgia of left side of  face    per pt started after stroke   Vertigo    Wears partial dentures    upper    Past Surgical History:  Procedure Laterality Date   CATARACT EXTRACTION W/ INTRAOCULAR LENS IMPLANT Bilateral    right 2023;  left , yrs ago   CHOLECYSTECTOMY     CHOLECYSTECTOMY, LAPAROSCOPIC  2000   HYSTEROSCOPY WITH D & C N/A 12/20/2022   Procedure: DILATATION AND CURETTAGE;  Surgeon: Lyn Henri, MD;  Location: [facility];  Service: Gynecology;  Laterality: N/A;   REVERSE SHOULDER ARTHROPLASTY Bilateral    right 06-25-2022 in [place];   approx.  2017  left side in [place]   TOTAL KNEE ARTHROPLASTY Bilateral    2004;  2005   UMBILICAL HERNIA REPAIR     child    Patient Active Problem List   Diagnosis Date Noted   Chronic pain of both knees 01/21/2023   Bilateral lower extremity edema 01/21/2023   History of reverse total replacement of right shoulder joint 01/11/2023   Osteoarthritis of right shoulder 01/11/2023   Glaucoma 01/11/2023   Gastroesophageal reflux disease 01/11/2023   History of shoulder surgery 01/11/2023   Facial neuralgia 01/11/2023   Pain in joint of right shoulder 07/16/2022   Mild cognitive impairment 02/08/2022   Mild late onset Alzheimer's dementia (HCC) 11/11/2021   Vitamin D deficiency 09/21/2020   Hypokalemia 08/05/2020   History of total bilateral knee replacement 06/20/2018   Low back pain 05/20/2015   Fatigue 05/20/2015   History of CVA (cerebrovascular accident) 05/20/2015   Insomnia 05/20/2015   Morbid obesity (HCC) 05/20/2015   Positive H. pylori test 05/20/2015   Tremor 05/20/2015   Dysfunction of eustachian tube 05/20/2015   Diverticulosis 05/20/2015   Essential (primary) hypertension 07/17/2007   Alterations of sensations, late effect of cerebrovascular disease 03/11/2005   Hyperlipidemia 10/12/2003   Trigeminal neuralgia 10/12/2003    Family History  Problem Relation Age of Onset   Stroke Mother    Obesity Mother    Hypertension Mother    Alzheimer's disease Father    Diabetes Father    Hypertension Sister    Hypertension Sister    Hypertension Sister    Hypertension Sister    Hypertension Sister    Breast cancer Sister 63   Diabetes Brother    Breast cancer Maternal Aunt 56   Breast cancer Maternal Aunt     Social History   Tobacco Use   Smoking status: Never   Smokeless tobacco: Never  Substance Use Topics   Alcohol use: No     Alcohol/week: 0.0 standard drinks of alcohol    Allergies  Allergen Reactions   Augmentin [Amoxicillin-Pot Clavulanate] Hives   Quinapril-Hydrochlorothiazide Other (See Comments) and Cough    Other reaction(s): Headache     Tramadol     Dizzy, headache, nosebleed, near-syncope   Silicone Rash    Current Meds  Medication Sig   Acetaminophen (TYLENOL EX ST ARTHRITIS PAIN PO) Take 2 tablets by mouth daily as needed (for arthritis pain).   albuterol (VENTOLIN HFA) 108 (90 Base) MCG/ACT inhaler Inhale 1-2 puffs into the lungs every 6 (six) hours as needed for wheezing or shortness of breath.   aspirin 81 MG tablet Take 1 tablet by mouth daily.   carbamazepine (TEGRETOL) 200 MG tablet Take 1 tablet (200 mg total) by mouth 2 (two) times daily.   clopidogrel (PLAVIX) 75 MG tablet Take 1 tablet (75 mg total) by mouth daily.  diltiazem (DILACOR XR) 180 MG 24 hr capsule TAKE 2 CAPSULES EVERY DAY   famotidine (PEPCID) 40 MG tablet Take 1 tablet (40 mg total) by mouth at bedtime.   furosemide (LASIX) 20 MG tablet Take 1 tablet (20 mg total) by mouth daily.   nortriptyline (PAMELOR) 10 MG capsule Take 1 capsule (10 mg total) by mouth at bedtime. TAKE 1 TO 2 CAPSULES BY MOUTH AT BEDTIME Strength: 10 mg   polyethylene glycol (MIRALAX / GLYCOLAX) 17 g packet Take 17 g by mouth 2 (two) times daily. (Patient taking differently: Take 17 g by mouth daily as needed for mild constipation.)   SIMBRINZA 1-0.2 % SUSP Place 1 drop into both eyes 2 (two) times daily.   simvastatin (ZOCOR) 10 MG tablet Take 1 tablet (10 mg total) by mouth at bedtime.   VITAMIN D PO Take 1 capsule by mouth daily.    Immunization History  Administered Date(s) Administered   Fluad Quad(high Dose 65+) 06/27/2020, 07/17/2021, 06/13/2023   Influenza, High Dose Seasonal PF 07/12/2016, 06/16/2017, 06/21/2018, 06/22/2019, 07/14/2022   Influenza-Unspecified 05/12/2015   Moderna Covid-19 Vaccine Bivalent Booster 49yrs & up  05/11/2021   Moderna SARS-COV2 Booster Vaccination 08/07/2020, 05/11/2021   Moderna Sars-Covid-2 Vaccination 11/24/2019, 12/24/2019, 08/07/2020   Pfizer(Comirnaty)Fall Seasonal Vaccine 12 years and older 07/14/2022, 06/13/2023   Pneumococcal Conjugate-13 12/05/2014   Pneumococcal Polysaccharide-23 05/09/2013   Tdap 08/12/2011   Zoster Recombinant(Shingrix) 07/20/2021, 09/25/2021        Objective:   BP (!) 142/84 (BP Location: Right Arm, Cuff Size: Large)   Pulse (!) 112   Temp 99.5 F (37.5 C)   Ht 5' (1.524 m)   Wt 214 lb 12.8 oz (97.4 kg)   SpO2 99%   BMI 41.95 kg/m   SpO2: 99 % O2 Device: None (Room air)  GENERAL: Obese woman, no acute distress, ambulatory with assistance of a walker.  Mild conversational dyspnea. HEAD: Normocephalic, atraumatic.  EYES: Pupils equal, round, reactive to light.  No scleral icterus.  MOUTH: Poor dentition, missing upper teeth.  Oral mucosa moist moist.  No thrush. NECK: Supple. No thyromegaly. Trachea midline. No JVD.  No adenopathy. PULMONARY: Good air entry bilaterally. + Wheezes throughout.  No rhonchi. CARDIOVASCULAR: S1 and S2. Regular rate and rhythm.  No rubs, murmurs or gallops heard. ABDOMEN: Obese abdomen, otherwise benign. MUSCULOSKELETAL: No joint deformity, no clubbing, no edema.  NEUROLOGIC: No overt focal deficit, gait assisted by walker, speech is fluent. SKIN: Intact,warm,dry. PSYCH: Mood and behavior normal.  Patient was unable to perform nitric oxide test.  Recent Results (from the past 2160 hour(s))  Basic Metabolic Panel (BMET)     Status: Abnormal   Collection Time: 05/11/23 10:37 AM  Result Value Ref Range   Sodium 141 135 - 145 mEq/L   Potassium 4.5 3.5 - 5.1 mEq/L   Chloride 109 96 - 112 mEq/L   CO2 25 19 - 32 mEq/L   Glucose, Bld 91 70 - 99 mg/dL   BUN 17 6 - 23 mg/dL   Creatinine, Ser 6.29 0.40 - 1.20 mg/dL   GFR 52.84 (L) >13.24 mL/min    Comment: Calculated using the CKD-EPI Creatinine Equation (2021)    Calcium 9.2 8.4 - 10.5 mg/dL  ECHOCARDIOGRAM COMPLETE     Status: None   Collection Time: 06/09/23  3:01 PM  Result Value Ref Range   S' Lateral 2.40 cm   Area-P 1/2 5.13 cm2   P 1/2 time 238 msec   Est EF 55 -  60%   CBC with Differential     Status: Abnormal   Collection Time: 07/03/23 10:00 AM  Result Value Ref Range   WBC 5.2 4.0 - 10.5 K/uL   RBC 3.73 (L) 3.87 - 5.11 MIL/uL   Hemoglobin 12.5 12.0 - 15.0 g/dL   HCT 10.2 72.5 - 36.6 %   MCV 100.5 (H) 80.0 - 100.0 fL   MCH 33.5 26.0 - 34.0 pg   MCHC 33.3 30.0 - 36.0 g/dL   RDW 44.0 34.7 - 42.5 %   Platelets 154 150 - 400 K/uL   nRBC 0.0 0.0 - 0.2 %   Neutrophils Relative % 53 %   Neutro Abs 2.8 1.7 - 7.7 K/uL   Lymphocytes Relative 32 %   Lymphs Abs 1.7 0.7 - 4.0 K/uL   Monocytes Relative 12 %   Monocytes Absolute 0.6 0.1 - 1.0 K/uL   Eosinophils Relative 2 %   Eosinophils Absolute 0.1 0.0 - 0.5 K/uL   Basophils Relative 1 %   Basophils Absolute 0.0 0.0 - 0.1 K/uL   Immature Granulocytes 0 %   Abs Immature Granulocytes 0.01 0.00 - 0.07 K/uL    Comment: Performed at [facility], [address] [place], [place] [address]  Comprehensive metabolic panel     Status: None   Collection Time: 07/03/23 10:00 AM  Result Value Ref Range   Sodium 141 135 - 145 mmol/L   Potassium 3.7 3.5 - 5.1 mmol/L   Chloride 107 98 - 111 mmol/L   CO2 25 22 - 32 mmol/L   Glucose, Bld 90 70 - 99 mg/dL    Comment: Glucose reference range applies only to samples taken after fasting for at least 8 hours.   BUN 21 8 - 23 mg/dL   Creatinine, Ser 7.56 0.44 - 1.00 mg/dL   Calcium 9.2 8.9 - 43.3 mg/dL   Total Protein 8.0 6.5 - 8.1 g/dL   Albumin 4.2 3.5 - 5.0 g/dL   AST 26 15 - 41 U/L   ALT 22 0 - 44 U/L   Alkaline Phosphatase 125 38 - 126 U/L   Total Bilirubin 0.4 0.3 - 1.2 mg/dL   GFR, Estimated >29 >51 mL/min    Comment: (NOTE) Calculated using the CKD-EPI Creatinine Equation (2021)    Anion gap 9 5 - 15    Comment:  Performed at [facility], [address] [place], [place] [address]  Troponin I (High Sensitivity)     Status: None   Collection Time: 07/03/23 10:00 AM  Result Value Ref Range   Troponin I (High Sensitivity) 6 <18 ng/L    Comment: (NOTE) Elevated high sensitivity troponin I (hsTnI) values and significant  changes across serial measurements may suggest ACS but many other  chronic and acute conditions are known to elevate hsTnI results.  Refer to the "Links" section for chest pain algorithms and additional  guidance. Performed at [facility], [address], [place], [place] [address]   Brain natriuretic peptide     Status: None   Collection Time: 07/03/23 10:01 AM  Result Value Ref Range   B Natriuretic Peptide 43.3 0.0 - 100.0 pg/mL    Comment: Performed at [facility], [address], [place], [place] [address]  D-dimer, quantitative     Status: Abnormal   Collection Time: 07/03/23 10:54 AM  Result Value Ref Range   D-Dimer, Quant 1.74 (H) 0.00 - 0.50 ug/mL-FEU    Comment: (NOTE) At the manufacturer cut-off  value of 0.5 g/mL FEU, this assay has a negative predictive value of 95-100%.This assay is intended for use in conjunction with a clinical pretest probability (PTP) assessment model to exclude pulmonary embolism (PE) and deep venous thrombosis (DVT) in outpatients suspected of PE or DVT. Results should be correlated with clinical presentation. Performed at [facility], [address] [place], [place] [address]      Assessment & Plan:     ICD-10-CM   1. Persistent asthma with undetermined severity  J45.998 Pulmonary function test    Allergen Panel (27) + IGE    CANCELED: Allergen Panel (27) + IGE    CANCELED: Pulmonary Function Test [facility] Only    CANCELED: Allergen Panel (27) + IGE    2. Shortness of breath  R06.02 Pulmonary function test    CANCELED: Pulmonary Function Test [facility]  Only    3. Gastroesophageal reflux disease, unspecified whether esophagitis present  K21.9     4. History of CVA (cerebrovascular accident)  Z86.73       Orders Placed This Encounter  Procedures   Allergen Panel (27) + IGE    Standing Status:   Future    Standing Expiration Date:   02/01/2024   Pulmonary Function Test [facility] Only    Standing Status:   Future    Standing Expiration Date:   08/02/2024    Scheduling Instructions:     Please do ASAP    Order Specific Question:   Full PFT: includes the following: basic spirometry, spirometry pre & post bronchodilator, diffusion capacity (DLCO), lung volumes    Answer:   Full PFT    Order Specific Question:   This test can only be performed at    Answer:   [facility]   Meds ordered this encounter  Medications   Fluticasone-Umeclidin-Vilant (TRELEGY ELLIPTA) 200-62.5-25 MCG/ACT AEPB    Sig: Inhale 1 puff into the lungs daily.    Dispense:  60 each    Refill:  6   predniSONE (DELTASONE) 20 MG tablet    Sig: Take 1 tablet (20 mg total) by mouth daily with breakfast for 5 days.    Dispense:  5 tablet    Refill:  0   Assessment and Plan    Asthma New onset shortness of breath and wheezing, likely secondary to asthma. No history of smoking or asthma in the past. Albuterol provides temporary relief. -Order pulmonary function tests. -Order allergen panel to assess for potential allergic triggers. -Start on a daily inhaler with bronchodilator and anti-inflammatory properties, pending insurance approval (Trelegy). -Start a short course of Prednisone for 5 days to reduce current inflammation.  Gastroesophageal Reflux Disease (GERD) Reports heartburn, currently on Famotidine with some relief. -Consider stronger medication for reflux if symptoms persist.  Lower Extremity Edema Reports swelling in legs, no further details provided. -Encourage physical activity such as walking.  Follow-up in 4-6 weeks to assess response to new  asthma treatment and discuss results of pulmonary function tests and allergen panel.      Total visit time 45 minutes.  Gailen Shelter, MD Advanced Bronchoscopy PCCM [facility]    *This note was dictated using voice recognition software/Dragon.  Despite best efforts to proofread, errors can occur which can change the meaning. Any transcriptional errors that result from this process are unintentional and may not be fully corrected at the time of dictation.

## 2023-08-05 ENCOUNTER — Encounter: Payer: Self-pay | Admitting: Surgical

## 2023-08-05 ENCOUNTER — Ambulatory Visit: Payer: Medicare HMO | Admitting: Surgical

## 2023-08-05 ENCOUNTER — Other Ambulatory Visit (INDEPENDENT_AMBULATORY_CARE_PROVIDER_SITE_OTHER): Payer: Self-pay

## 2023-08-05 DIAGNOSIS — M5442 Lumbago with sciatica, left side: Secondary | ICD-10-CM

## 2023-08-05 DIAGNOSIS — G8929 Other chronic pain: Secondary | ICD-10-CM

## 2023-08-05 MED ORDER — GABAPENTIN 100 MG PO CAPS: 100.0000 mg | ORAL_CAPSULE | Freq: Two times a day (BID) | ORAL | 0 refills | Status: AC | PRN

## 2023-08-05 NOTE — Progress Notes (Signed)
Office Visit Note   Patient: Amy Ray           Date of Birth: 18-Dec-1945           MRN: [id number] Visit Date: 08/05/2023 Requested by: Allwardt, Crist Infante, PA-C [address] [place],  [place] [address] PCP: Bary Leriche, PA-C  Subjective: Chief Complaint  Patient presents with   Lower Back - Pain    HPI: Amy Ray is a 77 y.o. female who presents to the office reporting low back pain with bilateral leg radicular pain.  She states that she first noticed pain about 6 months ago beginning in her low back.  This was without injury.  It has progressed to primarily be buttock pain in both buttocks that radiates down primarily to the upper thigh in both legs but occasionally down to the ankle.  She has no history of prior hip or back surgery.  Has tried Tylenol without much relief.  Pain does not wake her up at night.  Overall pain has improved over the last several months and some days she does not really notice much pain.  Pain is made worse with cleaning her house and sometimes she has to take breaks from cleaning.  Worse in the morning.  No history of weakness, prior back MRI, prior ESI's.  No groin pain, fevers, chills, incontinence, red flag symptoms..                ROS: All systems reviewed are negative as they relate to the chief complaint within the history of present illness.  Patient denies fevers or chills.  Assessment & Plan: Visit Diagnoses:  1. Chronic left-sided low back pain with left-sided sciatica     Plan: Patient is a 77 year old female who presents complaining of 6 months of occasional radicular leg pain in both legs.  First began as low back pain though the low back pain has improved and now the leg symptoms remain.  Reviewed radiographs with her today.  No red flag signs or symptoms.  We discussed options availed the patient including physical therapy versus anti-inflammatories versus MRI scan with plan for ESI's versus other medications.  Cannot take  anti-inflammatories because she takes Plavix.  Plan to try occasional gabapentin and if patient has worsening symptoms or little improvement, she will reach back out and I think the next step would be either trying physical therapy or getting MRI scan.  Follow-up as needed.  Follow-Up Instructions: No follow-ups on file.   Orders:  Orders Placed This Encounter  Procedures   XR Lumbar Spine 2-3 Views   Meds ordered this encounter  Medications   gabapentin (NEURONTIN) 100 MG capsule    Sig: Take 1 capsule (100 mg total) by mouth 2 (two) times daily as needed.    Dispense:  30 capsule    Refill:  0      Procedures: No procedures performed   Clinical Data: No additional findings.  Objective: Vital Signs: There were no vitals taken for this visit.  Physical Exam:  Constitutional: Patient appears well-developed HEENT:  Head: Normocephalic Eyes:EOM are normal Neck: Normal range of motion Cardiovascular: Normal rate Pulmonary/chest: Effort normal Neurologic: Patient is alert Skin: Skin is warm Psychiatric: Patient has normal mood and affect  Ortho Exam: Ortho exam demonstrates bilateral legs without clonus.  Intact hip flexion, quadricep, hamstring, dorsiflexion, plantarflexion, EHL rated 5/5.  Negative straight leg raise bilaterally.  No pain with hip range of motion bilaterally.  Negative FADIR sign  bilaterally.  No significant tenderness throughout the axial lumbar spine or SI joints.  Specialty Comments:  No specialty comments available.  Imaging: No results found.   PMFS History: Patient Active Problem List   Diagnosis Date Noted   Chronic pain of both knees 01/21/2023   Bilateral lower extremity edema 01/21/2023   History of reverse total replacement of right shoulder joint 01/11/2023   Osteoarthritis of right shoulder 01/11/2023   Glaucoma 01/11/2023   Gastroesophageal reflux disease 01/11/2023   History of shoulder surgery 01/11/2023   Facial neuralgia  01/11/2023   Pain in joint of right shoulder 07/16/2022   Mild cognitive impairment 02/08/2022   Mild late onset Alzheimer's dementia (HCC) 11/11/2021   Vitamin D deficiency 09/21/2020   Hypokalemia 08/05/2020   History of total bilateral knee replacement 06/20/2018   Low back pain 05/20/2015   Fatigue 05/20/2015   History of CVA (cerebrovascular accident) 05/20/2015   Insomnia 05/20/2015   Morbid obesity (HCC) 05/20/2015   Positive H. pylori test 05/20/2015   Tremor 05/20/2015   Dysfunction of eustachian tube 05/20/2015   Diverticulosis 05/20/2015   Essential (primary) hypertension 07/17/2007   Alterations of sensations, late effect of cerebrovascular disease 03/11/2005   Hyperlipidemia 10/12/2003   Trigeminal neuralgia 10/12/2003   Past Medical History:  Diagnosis Date   Alzheimers disease (HCC)    Anticoagulant long-term use    plavix--- manged by pcp   Chronic constipation    Diverticulosis of colon    Generalized abdominal pain    Glaucoma, both eyes    Hemorrhoids    History of CVA with residual deficit 03/16/2005   left facial paresthesia;  per pt note from stroke but in neurologist note is cva residual   History of diverticulitis of colon 05/20/2015   History of lower GI bleeding    Hyperlipidemia, mixed    Hypertension    per previous had nuclear stress test w/ cardiologist w/ dr Juliann Pares , results in care everywhere , NUC 02-01-2017  normal , ef 60%;  echo 02-02-2017 ef 50% mild AR/TR; and normal stress echo   MCI (mild cognitive impairment)    neurology--- Marlowe Kays PA ([facility] [facility]);  per note seconardy to vascular and alzeimer disease;  per MRI moderate to extensive CSV ischemia   Nocturia    OA (osteoarthritis)    PMB (postmenopausal bleeding)    Thickened endometrium    Trigeminal neuralgia of left side of face    per pt started after stroke   Vertigo    Wears partial dentures    upper    Family History  Problem Relation Age of Onset   Stroke  Mother    Obesity Mother    Hypertension Mother    Alzheimer's disease Father    Diabetes Father    Hypertension Sister    Hypertension Sister    Hypertension Sister    Hypertension Sister    Hypertension Sister    Breast cancer Sister 60   Diabetes Brother    Breast cancer Maternal Aunt 90   Breast cancer Maternal Aunt     Past Surgical History:  Procedure Laterality Date   CATARACT EXTRACTION W/ INTRAOCULAR LENS IMPLANT Bilateral    right 2023;  left , yrs ago   CHOLECYSTECTOMY     CHOLECYSTECTOMY, LAPAROSCOPIC  2000   HYSTEROSCOPY WITH D & C N/A 12/20/2022   Procedure: DILATATION AND CURETTAGE;  Surgeon: Lyn Henri, MD;  Location:  SUR[facility]ERY [facility]ENTER;  Service: Gynecology;  Laterality: N/A;  REVERSE SHOULDER ARTHROPLASTY Bilateral    right 06-25-2022 in [place];   approx.  2017  left side in [place]   TOTAL KNEE ARTHROPLASTY Bilateral    2004;  2005   UMBILICAL HERNIA REPAIR     child   Social History   Occupational History   Occupation: Retired  Tobacco Use   Smoking status: Never   Smokeless tobacco: Never  Vaping Use   Vaping status: Never Used  Substance and Sexual Activity   Alcohol use: No    Alcohol/week: 0.0 standard drinks of alcohol   Drug use: Never   Sexual activity: Not on file

## 2023-08-06 LAB — ALLERGEN PANEL (27) + IGE
Alternaria Alternata IgE: <0.1 kU/L
Aspergillus Fumigatus IgE: <0.1 kU/L
Bahia Grass IgE: <0.1 kU/L
Bermuda Grass IgE: <0.1 kU/L
Cat Dander IgE: <0.1 kU/L
Cedar, Mountain IgE: <0.1 kU/L
Cladosporium Herbarum IgE: <0.1 kU/L
Cocklebur IgE: <0.1 kU/L
Cockroach, American IgE: <0.1 kU/L
Common Silver Birch IgE: <0.1 kU/L
D Farinae IgE: <0.1 kU/L
D Pteronyssinus IgE: <0.1 kU/L
Dog Dander IgE: <0.1 kU/L
Elm, American IgE: <0.1 kU/L
Hickory, White IgE: <0.1 kU/L
IgE (Immunoglobulin E), Serum: 38 [IU]/mL (ref 6–495)
Johnson Grass IgE: <0.1 kU/L
Kentucky Bluegrass IgE: <0.1 kU/L
Maple/Box Elder IgE: <0.1 kU/L
Mucor Racemosus IgE: <0.1 kU/L
Oak, White IgE: <0.1 kU/L
Penicillium Chrysogen IgE: <0.1 kU/L
Pigweed, Rough IgE: <0.1 kU/L
Plantain, English IgE: <0.1 kU/L
Ragweed, Short IgE: <0.1 kU/L
Setomelanomma Rostrat: <0.1 kU/L
Timothy Grass IgE: <0.1 kU/L
White Mulberry IgE: <0.1 kU/L

## 2023-08-15 ENCOUNTER — Telehealth: Payer: Self-pay | Admitting: Surgical

## 2023-08-15 ENCOUNTER — Institutional Professional Consult (permissible substitution): Payer: Medicare HMO | Admitting: Pulmonary Disease

## 2023-08-15 DIAGNOSIS — G8929 Other chronic pain: Secondary | ICD-10-CM

## 2023-08-15 NOTE — Telephone Encounter (Signed)
Pt's daughter [name] called and pt would like to move forward with referral for MRI. Pt phone number is [phone].

## 2023-08-15 NOTE — Telephone Encounter (Signed)
MRI order placed in chart. Lvm advising

## 2023-08-16 ENCOUNTER — Other Ambulatory Visit: Payer: Self-pay

## 2023-08-16 ENCOUNTER — Emergency Department (HOSPITAL_COMMUNITY)
Admission: EM | Admit: 2023-08-16 | Discharge: 2023-08-16 | Disposition: A | Discharge status: Home / Self Care | Payer: Medicare HMO | Attending: Emergency Medicine | Admitting: Emergency Medicine

## 2023-08-16 ENCOUNTER — Emergency Department (HOSPITAL_COMMUNITY): Payer: Medicare HMO

## 2023-08-16 ENCOUNTER — Encounter (HOSPITAL_COMMUNITY): Payer: Medicare HMO

## 2023-08-16 DIAGNOSIS — I10 Essential (primary) hypertension: Secondary | ICD-10-CM | POA: Diagnosis not present

## 2023-08-16 DIAGNOSIS — M160 Bilateral primary osteoarthritis of hip: Secondary | ICD-10-CM | POA: Diagnosis not present

## 2023-08-16 DIAGNOSIS — Z7982 Long term (current) use of aspirin: Secondary | ICD-10-CM | POA: Diagnosis not present

## 2023-08-16 DIAGNOSIS — M5442 Lumbago with sciatica, left side: Secondary | ICD-10-CM | POA: Diagnosis not present

## 2023-08-16 DIAGNOSIS — M545 Low back pain, unspecified: Secondary | ICD-10-CM | POA: Diagnosis not present

## 2023-08-16 DIAGNOSIS — M25551 Pain in right hip: Secondary | ICD-10-CM | POA: Diagnosis not present

## 2023-08-16 DIAGNOSIS — M51361 Other intervertebral disc degeneration, lumbar region with lower extremity pain only: Secondary | ICD-10-CM | POA: Diagnosis not present

## 2023-08-16 DIAGNOSIS — M5441 Lumbago with sciatica, right side: Secondary | ICD-10-CM | POA: Insufficient documentation

## 2023-08-16 DIAGNOSIS — M549 Dorsalgia, unspecified: Secondary | ICD-10-CM | POA: Diagnosis not present

## 2023-08-16 DIAGNOSIS — R030 Elevated blood-pressure reading, without diagnosis of hypertension: Secondary | ICD-10-CM

## 2023-08-16 LAB — URINALYSIS, ROUTINE W REFLEX MICROSCOPIC
Bilirubin Urine: NEGATIVE
Glucose, UA: NEGATIVE mg/dL
Hgb urine dipstick: NEGATIVE
Ketones, ur: NEGATIVE mg/dL
Leukocytes,Ua: NEGATIVE
Nitrite: NEGATIVE
Protein, ur: NEGATIVE mg/dL
Specific Gravity, Urine: 1.006 (ref 1.005–1.030)
pH: 8 (ref 5.0–8.0)

## 2023-08-16 MED ORDER — HYDROCODONE-ACETAMINOPHEN 5-325 MG PO TABS: 1.0000 | ORAL_TABLET | Freq: Four times a day (QID) | ORAL | 0 refills | Status: DC | PRN

## 2023-08-16 MED ORDER — PREDNISONE 10 MG PO TABS: ORAL_TABLET | ORAL | 0 refills | Status: DC

## 2023-08-16 MED ORDER — HYDROCODONE-ACETAMINOPHEN 5-325 MG PO TABS
1.0000 | ORAL_TABLET | Freq: Once | ORAL | Status: AC
  Administered 2023-08-16: 1 via ORAL
  Filled 2023-08-16: qty 1

## 2023-08-16 NOTE — ED Triage Notes (Signed)
Patient BIB EMS from home with c/o lower back and right hip pain x2 months. Denies any injuries. 138/88, 84, 99%RA. Ambulatory with cane.

## 2023-08-16 NOTE — Discharge Instructions (Addendum)
It was our pleasure to provide your ER care today - we hope that you feel better.  Take prednisone as prescribed. You may take hydrocodone as need for pain - no driving when taking.  Also, do not take tylenol or other acetaminophen containing meds when taking the hydrocodone.   Follow up with primary care doctor/orthopedist in 1-2 weeks if symptoms fail to improve/resolve.  Return to ER if worse, new symptoms, fevers, severe/intractable pain, numbness/weakness, or other concern.

## 2023-08-16 NOTE — ED Notes (Signed)
Wheeled patient to the bathroom patient did well 

## 2023-08-16 NOTE — ED Notes (Signed)
Got patient vitals patient bed rails are up got patient a warm blanket

## 2023-08-16 NOTE — ED Provider Notes (Signed)
[place] EMERGENCY DEPARTMENT AT [facility] Provider Note   CSN: [id number] Arrival date & time: 08/16/23  0944     History  Chief Complaint  Patient presents with   Back Pain   Hip Pain    left    Amy Ray is a 77 y.o. female.  Pt c/o low back pain and right hip pain. Symptoms present intermittently for months. Dull pain, occasionally radiates to right buttock and thigh area. No recent trauma/fall. No saddle area or leg numbness. No weakness. No change in normal bowel/bladder control. No fever or chills.   The history is provided by the patient, medical records and the EMS personnel.  Back Pain Associated symptoms: no abdominal pain, no chest pain, no dysuria, no fever, no headaches, no numbness and no weakness   Hip Pain Pertinent negatives include no chest pain, no abdominal pain, no headaches and no shortness of breath.       Home Medications Prior to Admission medications   Medication Sig Start Date End Date Taking? Authorizing Provider  Acetaminophen (TYLENOL EX ST ARTHRITIS PAIN PO) Take 2 tablets by mouth daily as needed (for arthritis pain).    [provider]  albuterol (VENTOLIN HFA) 108 (90 Base) MCG/ACT inhaler Inhale 1-2 puffs into the lungs every 6 (six) hours as needed for wheezing or shortness of breath. 05/04/23   Palumbo, April, MD  aspirin 81 MG tablet Take 1 tablet by mouth daily.    [provider]  carbamazepine (TEGRETOL) 200 MG tablet Take 1 tablet (200 mg total) by mouth 2 (two) times daily. 01/14/23   Allwardt, Crist Infante, PA-C  clopidogrel (PLAVIX) 75 MG tablet Take 1 tablet (75 mg total) by mouth daily. 01/14/23   Allwardt, Crist Infante, PA-C  diltiazem (DILACOR XR) 180 MG 24 hr capsule TAKE 2 CAPSULES EVERY DAY 04/08/23   Allwardt, Alyssa M, PA-C  famotidine (PEPCID) 40 MG tablet Take 1 tablet (40 mg total) by mouth at bedtime. 01/14/23   Allwardt, Crist Infante, PA-C  Fluticasone-Umeclidin-Vilant (TRELEGY ELLIPTA) 200-62.5-25  MCG/ACT AEPB Inhale 1 puff into the lungs daily. 08/03/23   Salena Saner, MD  furosemide (LASIX) 20 MG tablet Take 1 tablet (20 mg total) by mouth daily. 01/21/23   Allwardt, Crist Infante, PA-C  gabapentin (NEURONTIN) 100 MG capsule Take 1 capsule (100 mg total) by mouth 2 (two) times daily as needed. 08/05/23   Magnant, Charles L, PA-C  nortriptyline (PAMELOR) 10 MG capsule Take 1 capsule (10 mg total) by mouth at bedtime. TAKE 1 TO 2 CAPSULES BY MOUTH AT BEDTIME Strength: 10 mg 02/08/23   Allwardt, Alyssa M, PA-C  polyethylene glycol (MIRALAX / GLYCOLAX) 17 g packet Take 17 g by mouth 2 (two) times daily. Patient taking differently: Take 17 g by mouth daily as needed for mild constipation. 08/03/22   Charlynne Pander, MD  SIMBRINZA 1-0.2 % SUSP Place 1 drop into both eyes 2 (two) times daily. 05/27/22   [provider]  simvastatin (ZOCOR) 10 MG tablet Take 1 tablet (10 mg total) by mouth at bedtime. 01/25/23   Allwardt, Alyssa M, PA-C  VITAMIN D PO Take 1 capsule by mouth daily.    [provider]      Allergies    Augmentin [amoxicillin-pot clavulanate], Quinapril-hydrochlorothiazide, Tramadol, and Silicone    Review of Systems   Review of Systems  Constitutional:  Negative for chills and fever.  Respiratory:  Negative for shortness of breath.   Cardiovascular:  Negative  for chest pain.  Gastrointestinal:  Negative for abdominal pain.  Genitourinary:  Negative for dysuria and flank pain.  Musculoskeletal:  Positive for back pain.  Skin:  Negative for rash.  Neurological:  Negative for weakness, numbness and headaches.    Physical Exam Updated Vital Signs BP (!) 149/67 (BP Location: Right Arm)   Pulse 81   Temp 98.3 F (36.8 C) (Oral)   Resp 18   SpO2 98%  Physical Exam Vitals and nursing note reviewed.  Constitutional:      Appearance: Normal appearance. She is well-developed.  HENT:     Head: Atraumatic.     Nose: Nose normal.     Mouth/Throat:      Mouth: Mucous membranes are moist.  Eyes:     General: No scleral icterus.    Conjunctiva/sclera: Conjunctivae normal.  Neck:     Trachea: No tracheal deviation.  Cardiovascular:     Rate and Rhythm: Normal rate.     Pulses: Normal pulses.  Pulmonary:     Effort: Pulmonary effort is normal. No respiratory distress.  Abdominal:     General: There is no distension.     Palpations: Abdomen is soft. There is no mass.     Tenderness: There is no abdominal tenderness.  Genitourinary:    Comments: No cva tenderness.  Musculoskeletal:        General: No swelling.     Cervical back: Neck supple. No muscular tenderness.     Comments: T/L/S spine non tender, aligned. No sts or skin lesions/rash to area of pain. Good passive rom right hip and knee without pain. No RLE edema. Distal pulses palp.   Skin:    General: Skin is warm and dry.     Findings: No rash.  Neurological:     Mental Status: She is alert.     Comments: Alert, speech normal. Motor/sens intact bil lower ext. Stre 5/5. Sens grossly intact.   Psychiatric:        Mood and Affect: Mood normal.     ED Results / Procedures / Treatments   Labs (all labs ordered are listed, but only abnormal results are displayed) Results for orders placed or performed during the hospital encounter of 08/16/23  Urinalysis, Routine w reflex microscopic -Urine, Clean Catch  Result Value Ref Range   Color, Urine STRAW (A) YELLOW   APPearance CLEAR CLEAR   Specific Gravity, Urine 1.006 1.005 - 1.030   pH 8.0 5.0 - 8.0   Glucose, UA NEGATIVE NEGATIVE mg/dL   Hgb urine dipstick NEGATIVE NEGATIVE   Bilirubin Urine NEGATIVE NEGATIVE   Ketones, ur NEGATIVE NEGATIVE mg/dL   Protein, ur NEGATIVE NEGATIVE mg/dL   Nitrite NEGATIVE NEGATIVE   Leukocytes,Ua NEGATIVE NEGATIVE   DG HIP UNILAT W OR W/O PELVIS 2-3 VIEWS RIGHT  Result Date: 08/16/2023 CLINICAL DATA:  Larey Seat 3 months ago in tub. Worsening lateral right hip pain today. EXAM: DG HIP (WITH OR  WITHOUT PELVIS) 2-3V RIGHT COMPARISON:  Pelvis and left hip radiographs 11/06/2020 FINDINGS: There is diffuse decreased bone mineralization. Mild superomedial bilateral femoroacetabular joint space narrowing is similar to prior. Normal morphology of the right femoral head-neck junction without CAM-type bump deformity. Mild pubic symphysis joint space narrowing, diffuse subchondral sclerosis, and mild peripheral osteophytosis. Moderate inferior left and mild inferior right sacroiliac subchondral sclerosis, similar to prior. IMPRESSION: 1. Mild bilateral femoroacetabular osteoarthritis, similar to prior. 2. Mild pubic symphysis osteoarthritis, similar to prior. 3. Moderate inferior left and mild inferior right sacroiliac  osteoarthritis, similar to prior. Electronically Signed   By: Neita Garnet M.D.   On: 08/16/2023 13:26   XR Lumbar Spine 2-3 Views  Result Date: 08/05/2023 AP and lateral views of lumbar spine reviewed.  Endplate degenerative changes noted at majority of levels worst at L5-S1.  There is at least moderate facet joint arthritis noted at multiple levels primarily at L4-L5 and L5-S1.  Grade 1-2 spondylolisthesis noted at L4-L5.  No fracture.  DG Chest Port 1 View  Result Date: 07/31/2023 CLINICAL DATA:  Shortness of breath and wheezing since last night. EXAM: PORTABLE CHEST 1 VIEW COMPARISON:  07/03/2023 FINDINGS: Patient is rotated. Heart size and pulmonary vascularity are normal. Lungs are clear. No pleural effusions. No pneumothorax. Mediastinal contours appear intact. Degenerative changes in the spine. Postoperative changes in both shoulders. IMPRESSION: No active disease. Electronically Signed   By: Burman Nieves M.D.   On: 07/31/2023 23:12    EKG None  Radiology No results found.  Procedures Procedures    Medications Ordered in ED Medications  HYDROcodone-acetaminophen (NORCO/VICODIN) 5-325 MG per tablet 1 tablet (has no administration in time range)    ED Course/  Medical Decision Making/ A&P                                 Medical Decision Making Problems Addressed: Degeneration of intervertebral disc of lumbar region with lower extremity pain: chronic illness or injury with exacerbation, progression, or side effects of treatment that poses a threat to life or bodily functions Elevated blood pressure reading: acute illness or injury Essential hypertension: chronic illness or injury with exacerbation, progression, or side effects of treatment that poses a threat to life or bodily functions Right-sided low back pain with bilateral sciatica, unspecified chronicity: acute illness or injury with systemic symptoms that poses a threat to life or bodily functions  Amount and/or Complexity of Data Reviewed Independent Historian: EMS    Details: Ems/family, hx External Data Reviewed: radiology and notes. Labs: ordered. Decision-making details documented in ED Course. Radiology: ordered and independent interpretation performed. Decision-making details documented in ED Course.  Risk OTC drugs. Prescription drug management.   Imaging ordered.  Reviewed nursing notes and prior charts for additional history.  Pt w recent ortho eval for same, and had back imaging then with degenerative changes, no fx, has plans for f/u and outpatient mri.   Xrays reviewed/interpreted by me - no fx. Degen changes.   Labs reviewed/interpreted by me - ua neg for uti.   Hydrocodone po.   Pt comfortable, nad, and appears stable for d/c.  Rec pcp/ortho f/u.  Return precautions provided.         Final Clinical Impression(s) / ED Diagnoses Final diagnoses:  None    Rx / DC Orders ED Discharge Orders     None         Cathren Laine, MD 08/16/23 1428

## 2023-08-25 ENCOUNTER — Telehealth: Payer: Self-pay | Admitting: Pulmonary Disease

## 2023-08-25 NOTE — Telephone Encounter (Signed)
Called and spoke to patient. She stated that she has 10 puffs left of trelegy sample. She does not like the taste of the medication. She does rinse her mouth out every each each. She is concerned that she is not getting enough of the medication into her lungs. She does note that wheezing has improved.  Denied f/c/s, SOB, cough or additional sx. She would like to d/c inhaler. She is aware that Dr. Jayme Cloud will not be in the office until 11/15. She is okay with waiting for a response.   Dr. Jayme Cloud, please advise. Thanks

## 2023-08-25 NOTE — Telephone Encounter (Signed)
Pt states she is on #10 on her Trelegy inhaler and she isn't being able to do it right. She does not like it. She is no longer wheezing. What should she do

## 2023-08-26 MED ORDER — BUDESONIDE-FORMOTEROL FUMARATE 160-4.5 MCG/ACT IN AERO: 2.0000 | INHALATION_SPRAY | Freq: Two times a day (BID) | RESPIRATORY_TRACT | 12 refills | Status: DC

## 2023-08-26 NOTE — Telephone Encounter (Signed)
Patient is aware of  below message/recommendations and voiced her understanding.  She is aware to d/c trelegy.  Symbicort has been sent to preferred pharmacy. Nothing further needed.

## 2023-08-26 NOTE — Telephone Encounter (Signed)
She definitely needs an inhaler we could potentially switch her to either Symbicort or Dulera.  If Symbicort it would be 160/4.5, 2 inhalations twice a day, if Dulera 200/5, 2 inhalations twice a day.  She should rinse her mouth well after use.  Choice of inhaler is based on what insurance will cover.

## 2023-08-28 ENCOUNTER — Other Ambulatory Visit: Payer: Self-pay | Admitting: Physician Assistant

## 2023-09-09 ENCOUNTER — Inpatient Hospital Stay (HOSPITAL_COMMUNITY): Admission: RE | Admit: 2023-09-09 | Payer: Medicare HMO | Source: Ambulatory Visit

## 2023-09-11 ENCOUNTER — Other Ambulatory Visit: Payer: Self-pay | Admitting: Physician Assistant

## 2023-09-14 ENCOUNTER — Encounter (HOSPITAL_COMMUNITY): Payer: Medicare HMO

## 2023-09-14 ENCOUNTER — Telehealth: Payer: Self-pay | Admitting: Physician Assistant

## 2023-09-14 NOTE — Telephone Encounter (Signed)
Please see call note and advise if you would like me to reach out to patient

## 2023-09-14 NOTE — Telephone Encounter (Signed)
Final outcome: See HCP within 4 hours. I called and offered pt an OV with Jarold Motto for 3:20 pm but pt declined this visit stating she can't make it at this time. Pt then stated she would call back before disconnecting the call.    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 Caller is having sciatic pain going from her back down her leg. Additional Comment Claler disconnected before office could transfer her. Translation No Nurse Assessment Nurse: Lily Kocher, RN, Adriana Date/Time (Eastern Time): 09/14/2023 10:10:13 AM Confirm and document reason for call. If symptomatic, describe symptoms. ---pt reports back and leg pain. near buttocks, down left leg. 10/10 Does the patient have any new or worsening symptoms? ---Yes Will a triage be completed? ---Yes Related visit to physician within the last 2 weeks? ---Yes Does the PT have any chronic conditions? (i.e. diabetes, asthma, this includes High risk factors for pregnancy, etc.) ---Yes List chronic conditions. ---sciatica htn arthritis Is this a behavioral health or substance abuse call? ---No Guidelines Guideline Title Affirmed Question Affirmed Notes Nurse Date/Time (Eastern Time) Back Pain [1] SEVERE back pain (e.g., excruciating, unable to do any normal activities) AND [2] not improved 2 hours after pain medicine Lily Kocher, RN, Adriana  09/14/2023 10:13:13 AM Disp. Time Lamount Cohen Time) Disposition Final User 09/14/2023 10:16:33 AM See HCP within 4 Hours (or PCP triage) Yes Lily Kocher, RN, Adriana Final Disposition 09/14/2023 10:16:33 AM See HCP within 4 Hours (or PCP triage) Yes Lily Kocher, RN, Ricki Rodriguez Caller Disagree/Comply Disagree Caller Understands Yes PreDisposition Call Doctor Care Advice Given Per Guideline SEE HCP (OR PCP TRIAGE) WITHIN 4 HOURS: CALL BACK IF: * You become worse CARE ADVICE given per Back Pain (Adult) guideline. Referrals GO TO FACILITY REFUSED

## 2023-09-14 NOTE — Telephone Encounter (Signed)
Noted and agreed, thank you. Will wait to hear back from patient.

## 2023-09-14 NOTE — Telephone Encounter (Signed)
FYI: This call has been transferred to triage nurse: Access Nurse. Once the result note has been entered staff can address the message at that time.  Patient called in with the following symptoms:  Red Word: Pain in lower back going down leg; Pt sounded greatly distressed.    Please advise at [facility] [phone] I spoke with stacy, triage advocate and due to patient disconnecting before transfer, she stated she would give chart to triage nurse and have them call patient back.    Message is routed to Provider Pool.

## 2023-09-16 ENCOUNTER — Ambulatory Visit (HOSPITAL_COMMUNITY)
Admission: RE | Admit: 2023-09-16 | Discharge: 2023-09-16 | Disposition: A | Discharge status: Home / Self Care | Payer: Medicare HMO | Source: Ambulatory Visit | Attending: Pulmonary Disease

## 2023-09-16 DIAGNOSIS — R0602 Shortness of breath: Secondary | ICD-10-CM | POA: Diagnosis not present

## 2023-09-16 DIAGNOSIS — J45998 Other asthma: Secondary | ICD-10-CM | POA: Diagnosis not present

## 2023-09-16 LAB — PULMONARY FUNCTION TEST
DL/VA % pred: 114 %
DL/VA: 4.83 ml/min/mmHg/L
DLCO unc % pred: 81 %
DLCO unc: 13.8 ml/min/mmHg
FEF 25-75 Post: 3.59 L/s
FEF 25-75 Pre: 2.1 L/s
FEF2575-%Change-Post: 71 %
FEF2575-%Pred-Post: 254 %
FEF2575-%Pred-Pre: 148 %
FEV1-%Change-Post: 11 %
FEV1-%Pred-Post: 98 %
FEV1-%Pred-Pre: 88 %
FEV1-Post: 1.73 L
FEV1-Pre: 1.56 L
FEV1FVC-%Change-Post: 3 %
FEV1FVC-%Pred-Pre: 115 %
FEV6-%Change-Post: 7 %
FEV6-%Pred-Post: 86 %
FEV6-%Pred-Pre: 80 %
FEV6-Post: 1.93 L
FEV6-Pre: 1.8 L
FEV6FVC-%Pred-Post: 105 %
FEV6FVC-%Pred-Pre: 105 %
FVC-%Change-Post: 7 %
FVC-%Pred-Post: 81 %
FVC-%Pred-Pre: 76 %
FVC-Post: 1.93 L
FVC-Pre: 1.8 L
Post FEV1/FVC ratio: 90 %
Post FEV6/FVC ratio: 100 %
Pre FEV1/FVC ratio: 86 %
Pre FEV6/FVC Ratio: 100 %

## 2023-09-16 MED ORDER — ALBUTEROL SULFATE (2.5 MG/3ML) 0.083% IN NEBU
2.5000 mg | INHALATION_SOLUTION | Freq: Once | RESPIRATORY_TRACT | Status: AC
Start: 2023-09-16 — End: 2023-09-16
  Administered 2023-09-16: 2.5 mg via RESPIRATORY_TRACT

## 2023-09-20 ENCOUNTER — Encounter: Payer: Self-pay | Admitting: Pulmonary Disease

## 2023-09-20 ENCOUNTER — Ambulatory Visit: Payer: Medicare HMO | Admitting: Pulmonary Disease

## 2023-09-20 VITALS — BP 130/84 | HR 90 | Temp 97.3°F | Ht 60.0 in | Wt 210.0 lb

## 2023-09-20 DIAGNOSIS — J4540 Moderate persistent asthma, uncomplicated: Secondary | ICD-10-CM

## 2023-09-20 DIAGNOSIS — J209 Acute bronchitis, unspecified: Secondary | ICD-10-CM

## 2023-09-20 DIAGNOSIS — R0602 Shortness of breath: Secondary | ICD-10-CM | POA: Diagnosis not present

## 2023-09-20 MED ORDER — AZITHROMYCIN 250 MG PO TABS: ORAL_TABLET | ORAL | 0 refills | Status: AC

## 2023-09-20 MED ORDER — ALBUTEROL SULFATE HFA 108 (90 BASE) MCG/ACT IN AERS: 1.0000 | INHALATION_SPRAY | Freq: Four times a day (QID) | RESPIRATORY_TRACT | 6 refills | Status: DC | PRN

## 2023-09-20 NOTE — Patient Instructions (Signed)
VISIT SUMMARY:  During your visit, we discussed your asthma management, recent cold symptoms, and overall health. We reviewed your current medications and made some adjustments to better manage your conditions.  YOUR PLAN:  -ASTHMA: Asthma is a chronic condition that affects your airways, making it hard to breathe at times. You will continue using Symbicort, which you find effective. A refill for Symbicort has been sent to your pharmacy. Additionally, an albuterol inhaler has been prescribed for emergency use during acute shortness of breath or wheezing.  -ACUTE BRONCHITIS: Acute bronchitis is an inflammation of the airways usually caused by a recent cold, leading to a productive cough. You will start taking azithromycin (Z-Pak) to help reduce your cough and address the bronchitis. Take 2 pills on the first day, then 1 pill daily for the next 4 days.  -HYPERTENSION: Hypertension, or high blood pressure, is being managed well with your current treatment. Continue to monitor your blood pressure regularly.  INSTRUCTIONS:  Please schedule a follow-up appointment in 4 months to review your progress and make any necessary adjustments to your treatment plan.

## 2023-09-20 NOTE — Progress Notes (Signed)
Subjective:    Patient ID: Amy Ray, female    DOB: 06/25/1946, 77 y.o.   MRN: [id number]  Patient Care Team: Allwardt, Crist Infante, PA-C as PCP - General (Physician Assistant) Jake Bathe, MD as PCP - Cardiology (Cardiology) Isla Pence, OD as Consulting Physician (Optometry) Deeann Saint, MD (Orthopedic Surgery) Alwyn Pea, MD as Consulting Physician (Cardiology)  Chief Complaint  Patient presents with   Follow-up    DOE. No wheezing. Cough with clear sputum.     BACKGROUND/INTERVAL:This is a 77 year old lifelong never smoker with a history as noted below, who presents for follow-up of shortness of breath present since February 2024, worse with activity and improved with albuterol.  Was initially seen here on 03 August 2023 for the details of that visit please refer to that note.  Patient was treated with Trelegy Ellipta for presumed asthma.  Trelegy Ellipta had to be switched to Symbicort due to the patient not tolerating taste of the medication.  Patient reports to be doing well with Symbicort.  HPI Discussed the use of AI scribe software for clinical note transcription with the patient, who gave verbal consent to proceed.  History of Present Illness   Amy Ray, a patient with a diagnosis of asthma, presents with a recent history of upper respiratory infection. The patient reports no other significant health issues. She was previously trialed on Trelegy, but disliked the taste of the medication. Despite this, she acknowledges that Trelegy was effective in controlling her wheezing. Currently, she is on Symbicort, which she finds satisfactory and believes it is helping her condition.  The patient reports a recent cold, which she is trying to recover from. Initially, she was coughing up green sputum, which has now turned yellow to occasionally clear. She denies taking any antibiotics for this condition. She has been managing her symptoms with Coricidin HBP due to  her high blood pressure.  The patient has a family history of asthma, with an older sister who had severe asthma and subsequently passed away. The patient denies any personal history of asthma in her childhood.  The patient also requests a refill of her Symbicort inhaler and inquires about the availability of an emergency inhaler, such as albuterol, for instances of acute shortness of breath or wheezing. She expresses no difficulties in accessing the clinic for her appointments.     Had PFTs on 16 September 2023, patient could not perform lung volumes due to claustrophobia.  FEV1 was 1.56 L or 88% predicted FVC 1.80 L or 76% predicted.  There was modest sponsor bronchodilators with an 11% change in FEV1.  Patient capacity was within normal limits slow vital capacity was normal ERV low at 26% consistent with obesity.   Review of Systems A 10 point review of systems was performed and it is as noted above otherwise negative.   Patient Active Problem List   Diagnosis Date Noted   Chronic pain of both knees 01/21/2023   Bilateral lower extremity edema 01/21/2023   History of reverse total replacement of right shoulder joint 01/11/2023   Osteoarthritis of right shoulder 01/11/2023   Glaucoma 01/11/2023   Gastroesophageal reflux disease 01/11/2023   History of shoulder surgery 01/11/2023   Facial neuralgia 01/11/2023   Pain in joint of right shoulder 07/16/2022   Mild cognitive impairment 02/08/2022   Mild late onset Alzheimer's dementia (HCC) 11/11/2021   Vitamin D deficiency 09/21/2020   Hypokalemia 08/05/2020   History of total bilateral knee replacement 06/20/2018  Low back pain 05/20/2015   Fatigue 05/20/2015   History of CVA (cerebrovascular accident) 05/20/2015   Insomnia 05/20/2015   Morbid obesity (HCC) 05/20/2015   Positive H. pylori test 05/20/2015   Tremor 05/20/2015   Dysfunction of eustachian tube 05/20/2015   Diverticulosis 05/20/2015   Essential (primary) hypertension  07/17/2007   Alterations of sensations, late effect of cerebrovascular disease 03/11/2005   Hyperlipidemia 10/12/2003   Trigeminal neuralgia 10/12/2003    Social History   Tobacco Use   Smoking status: Never   Smokeless tobacco: Never  Substance Use Topics   Alcohol use: No    Alcohol/week: 0.0 standard drinks of alcohol    Allergies  Allergen Reactions   Augmentin [Amoxicillin-Pot Clavulanate] Hives   Quinapril-Hydrochlorothiazide Other (See Comments) and Cough    Other reaction(s): Headache     Tramadol     Dizzy, headache, nosebleed, near-syncope   Silicone Rash    Current Meds  Medication Sig   Acetaminophen (TYLENOL EX ST ARTHRITIS PAIN PO) Take 2 tablets by mouth daily as needed (for arthritis pain).   aspirin 81 MG tablet Take 1 tablet by mouth daily.   azithromycin (ZITHROMAX) 250 MG tablet Take 2 tablets (500 mg) on  Day 1,  followed by 1 tablet (250 mg) once daily on Days 2 through 5.   budesonide-formoterol (SYMBICORT) 160-4.5 MCG/ACT inhaler Inhale 2 puffs into the lungs 2 (two) times daily.   carbamazepine (TEGRETOL) 200 MG tablet TAKE 1 TABLET TWICE DAILY   clopidogrel (PLAVIX) 75 MG tablet Take 1 tablet (75 mg total) by mouth daily.   diltiazem (DILACOR XR) 180 MG 24 hr capsule TAKE 2 CAPSULES EVERY DAY   famotidine (PEPCID) 40 MG tablet Take 1 tablet (40 mg total) by mouth at bedtime.   furosemide (LASIX) 20 MG tablet Take 1 tablet (20 mg total) by mouth daily.   gabapentin (NEURONTIN) 100 MG capsule Take 1 capsule (100 mg total) by mouth 2 (two) times daily as needed.   HYDROcodone-acetaminophen (NORCO/VICODIN) 5-325 MG tablet Take 1 tablet by mouth every 6 (six) hours as needed for moderate pain (pain score 4-6).   nortriptyline (PAMELOR) 10 MG capsule Take 1 capsule (10 mg total) by mouth at bedtime. TAKE 1 TO 2 CAPSULES BY MOUTH AT BEDTIME Strength: 10 mg   polyethylene glycol (MIRALAX / GLYCOLAX) 17 g packet Take 17 g by mouth 2 (two) times daily.  (Patient taking differently: Take 17 g by mouth daily as needed for mild constipation.)   predniSONE (DELTASONE) 10 MG tablet 2 po once a day for 3 days, then 1 po once a day for 3 days   SIMBRINZA 1-0.2 % SUSP Place 1 drop into both eyes 2 (two) times daily.   simvastatin (ZOCOR) 10 MG tablet Take 1 tablet (10 mg total) by mouth at bedtime.   VITAMIN D PO Take 1 capsule by mouth daily.   [DISCONTINUED] albuterol (VENTOLIN HFA) 108 (90 Base) MCG/ACT inhaler Inhale 1-2 puffs into the lungs every 6 (six) hours as needed for wheezing or shortness of breath.    Immunization History  Administered Date(s) Administered   Fluad Quad(high Dose 65+) 06/27/2020, 07/17/2021, 06/13/2023   Influenza, High Dose Seasonal PF 07/12/2016, 06/16/2017, 06/21/2018, 06/22/2019, 07/14/2022   Influenza-Unspecified 05/12/2015   Moderna Covid-19 Vaccine Bivalent Booster 59yrs & up 05/11/2021   Moderna SARS-COV2 Booster Vaccination 08/07/2020, 05/11/2021   Moderna Sars-Covid-2 Vaccination 11/24/2019, 12/24/2019, 08/07/2020   Pfizer(Comirnaty)Fall Seasonal Vaccine 12 years and older 07/14/2022, 06/13/2023   Pneumococcal Conjugate-13  12/05/2014   Pneumococcal Polysaccharide-23 05/09/2013   Tdap 08/12/2011   Zoster Recombinant(Shingrix) 07/20/2021, 09/25/2021        Objective:     BP 130/84 (BP Location: Right Arm, Cuff Size: Large)   Pulse 90   Temp (!) 97.3 F (36.3 C)   Ht 5' (1.524 m)   Wt 210 lb (95.3 kg)   SpO2 99%   BMI 41.01 kg/m   SpO2: 99 % O2 Device: None (Room air)  GENERAL: Obese woman, no acute distress, ambulatory with assistance of a walker.  No conversational dyspnea. HEAD: Normocephalic, atraumatic.  EYES: Pupils equal, round, reactive to light.  No scleral icterus.  MOUTH: Poor dentition, missing upper teeth.  Oral mucosa moist moist.  No thrush. NECK: Supple. No thyromegaly. Trachea midline. No JVD.  No adenopathy. PULMONARY: Good air entry bilaterally.  No wheezes noted, coarse,  no rhonchi. CARDIOVASCULAR: S1 and S2. Regular rate and rhythm.  No rubs, murmurs or gallops heard. ABDOMEN: Obese abdomen, otherwise benign. MUSCULOSKELETAL: No joint deformity, no clubbing, no edema.  NEUROLOGIC: No overt focal deficit, gait assisted by walker, speech is fluent. SKIN: Intact,warm,dry. PSYCH: Mood and behavior normal.   Recent Results (from the past 2160 hour(s))  CBC with Differential     Status: Abnormal   Collection Time: 07/03/23 10:00 AM  Result Value Ref Range   WBC 5.2 4.0 - 10.5 K/uL   RBC 3.73 (L) 3.87 - 5.11 MIL/uL   Hemoglobin 12.5 12.0 - 15.0 g/dL   HCT 16.1 09.6 - 04.5 %   MCV 100.5 (H) 80.0 - 100.0 fL   MCH 33.5 26.0 - 34.0 pg   MCHC 33.3 30.0 - 36.0 g/dL   RDW 40.9 81.1 - 91.4 %   Platelets 154 150 - 400 K/uL   nRBC 0.0 0.0 - 0.2 %   Neutrophils Relative % 53 %   Neutro Abs 2.8 1.7 - 7.7 K/uL   Lymphocytes Relative 32 %   Lymphs Abs 1.7 0.7 - 4.0 K/uL   Monocytes Relative 12 %   Monocytes Absolute 0.6 0.1 - 1.0 K/uL   Eosinophils Relative 2 %   Eosinophils Absolute 0.1 0.0 - 0.5 K/uL   Basophils Relative 1 %   Basophils Absolute 0.0 0.0 - 0.1 K/uL   Immature Granulocytes 0 %   Abs Immature Granulocytes 0.01 0.00 - 0.07 K/uL    Comment: Performed at [facility], [address] [place], [place] [address]  Comprehensive metabolic panel     Status: None   Collection Time: 07/03/23 10:00 AM  Result Value Ref Range   Sodium 141 135 - 145 mmol/L   Potassium 3.7 3.5 - 5.1 mmol/L   Chloride 107 98 - 111 mmol/L   CO2 25 22 - 32 mmol/L   Glucose, Bld 90 70 - 99 mg/dL    Comment: Glucose reference range applies only to samples taken after fasting for at least 8 hours.   BUN 21 8 - 23 mg/dL   Creatinine, Ser 6.21 0.44 - 1.00 mg/dL   Calcium 9.2 8.9 - 30.8 mg/dL   Total Protein 8.0 6.5 - 8.1 g/dL   Albumin 4.2 3.5 - 5.0 g/dL   AST 26 15 - 41 U/L   ALT 22 0 - 44 U/L   Alkaline Phosphatase 125 38 - 126 U/L   Total Bilirubin  0.4 0.3 - 1.2 mg/dL   GFR, Estimated >65 >78 mL/min    Comment: (NOTE) Calculated using the CKD-EPI Creatinine Equation (2021)  Anion gap 9 5 - 15    Comment: Performed at [facility], [address], [place], [place] [address]  Troponin I (High Sensitivity)     Status: None   Collection Time: 07/03/23 10:00 AM  Result Value Ref Range   Troponin I (High Sensitivity) 6 <18 ng/L    Comment: (NOTE) Elevated high sensitivity troponin I (hsTnI) values and significant  changes across serial measurements may suggest ACS but many other  chronic and acute conditions are known to elevate hsTnI results.  Refer to the "Links" section for chest pain algorithms and additional  guidance. Performed at [facility], [address] [place], [place] [address]   Brain natriuretic peptide     Status: None   Collection Time: 07/03/23 10:01 AM  Result Value Ref Range   B Natriuretic Peptide 43.3 0.0 - 100.0 pg/mL    Comment: Performed at [facility], [address], [place], [place] [address]  D-dimer, quantitative     Status: Abnormal   Collection Time: 07/03/23 10:54 AM  Result Value Ref Range   D-Dimer, Quant 1.74 (H) 0.00 - 0.50 ug/mL-FEU    Comment: (NOTE) At the manufacturer cut-off value of 0.5 g/mL FEU, this assay has a negative predictive value of 95-100%.This assay is intended for use in conjunction with a clinical pretest probability (PTP) assessment model to exclude pulmonary embolism (PE) and deep venous thrombosis (DVT) in outpatients suspected of PE or DVT. Results should be correlated with clinical presentation. Performed at [facility], [address], [place], [place] [address]   Allergen Panel (27) + IGE     Status: None   Collection Time: 08/03/23  9:59 AM  Result Value Ref Range   Class Description Allergens Comment     Comment: (NOTE)    Levels of Specific IgE       Class  Description of  Class    ---------------------------  -----  --------------------                   < 0.10         0         Negative           0.10 -    0.31         0/I       Equivocal/Low           0.32 -    0.55         I         Low           0.56 -    1.40         II        Moderate           1.41 -    3.90         III       High           3.91 -   19.00         IV        Very High          19.01 -  100.00         V         Very High                  >100.00         VI  Very High    IgE (Immunoglobulin E), Serum 38 6 - 495 IU/mL   D Pteronyssinus IgE <0.10 Class 0 kU/L   D Farinae IgE <0.10 Class 0 kU/L   Cat Dander IgE <0.10 Class 0 kU/L   Dog Dander IgE <0.10 Class 0 kU/L   [place] Grass IgE <0.10 Class 0 kU/L   Timothy Grass IgE <0.10 Class 0 kU/L   [place] Bluegrass IgE <0.10 Class 0 kU/L   [place] Grass IgE <0.10 Class 0 kU/L   Bahia Grass IgE <0.10 Class 0 kU/L   Cockroach, American IgE <0.10 Class 0 kU/L    Comment: (NOTE) This test was developed and its performance characteristics determined by [facility].  It has not been cleared or approved by the U.S. Food and Drug Administration. The FDA has determined that such clearance or approval is not necessary. This test is used for clinical purposes.  It should not be regarded as investigational or for research.    Penicillium Chrysogen IgE <0.10 Class 0 kU/L   Cladosporium Herbarum IgE <0.10 Class 0 kU/L   Aspergillus Fumigatus IgE <0.10 Class 0 kU/L   Mucor Racemosus IgE <0.10 Class 0 kU/L   Alternaria Alternata IgE <0.10 Class 0 kU/L   Setomelanomma Rostrat <0.10 Class 0 kU/L   Oak, White IgE <0.10 Class 0 kU/L   Elm, American IgE <0.10 Class 0 kU/L   Maple/Box Elder IgE <0.10 Class 0 kU/L   Common Silver Charletta Cousin IgE <0.10 Class 0 kU/L   Hickory, White IgE <0.10 Class 0 kU/L    Comment: (NOTE) This test was developed and its performance characteristics determined by [facility].  It has not been cleared or approved by the U.S.  Food and Drug Administration. The FDA has determined that such clearance or approval is not necessary. This test is used for clinical purposes.  It should not be regarded as investigational or for research.    White Mulberry IgE <0.10 Class 0 kU/L   Cedar, [place] IgE <0.10 Class 0 kU/L   Ragweed, Short IgE <0.10 Class 0 kU/L   Plantain, English IgE <0.10 Class 0 kU/L   Cocklebur IgE <0.10 Class 0 kU/L   Pigweed, Rough IgE <0.10 Class 0 kU/L    Comment: (NOTE) Performed At: [facility] [address] [place], [place] [id number] Jolene Schimke MD NW:[id number]   Urinalysis, Routine w reflex microscopic -Urine, Clean Catch     Status: Abnormal   Collection Time: 08/16/23  1:37 PM  Result Value Ref Range   Color, Urine STRAW (A) YELLOW   APPearance CLEAR CLEAR   Specific Gravity, Urine 1.006 1.005 - 1.030   pH 8.0 5.0 - 8.0   Glucose, UA NEGATIVE NEGATIVE mg/dL   Hgb urine dipstick NEGATIVE NEGATIVE   Bilirubin Urine NEGATIVE NEGATIVE   Ketones, ur NEGATIVE NEGATIVE mg/dL   Protein, ur NEGATIVE NEGATIVE mg/dL   Nitrite NEGATIVE NEGATIVE   Leukocytes,Ua NEGATIVE NEGATIVE    Comment: Performed at [facility], [address], [place], [place] [address]  Pulmonary function test     Status: None   Collection Time: 09/16/23  8:59 AM  Result Value Ref Range   FVC-Pre 1.80 L   FVC-%Pred-Pre 76 %   FVC-Post 1.93 L   FVC-%Pred-Post 81 %   FVC-%Change-Post 7 %   FEV1-Pre 1.56 L   FEV1-%Pred-Pre 88 %   FEV1-Post 1.73 L   FEV1-%Pred-Post 98 %   FEV1-%Change-Post 11 %   FEV6-Pre 1.80 L   FEV6-%Pred-Pre  80 %   FEV6-Post 1.93 L   FEV6-%Pred-Post 86 %   FEV6-%Change-Post 7 %   Pre FEV1/FVC ratio 86 %   FEV1FVC-%Pred-Pre 115 %   Post FEV1/FVC ratio 90 %   FEV1FVC-%Change-Post 3 %   Pre FEV6/FVC Ratio 100 %   FEV6FVC-%Pred-Pre 105 %   Post FEV6/FVC ratio 100 %   FEV6FVC-%Pred-Post 105 %   FEF 25-75 Pre 2.10 L/sec   FEF2575-%Pred-Pre 148 %   FEF 25-75 Post 3.59  L/sec   FEF2575-%Pred-Post 254 %   FEF2575-%Change-Post 71 %   DLCO unc 13.80 ml/min/mmHg   DLCO unc % pred 81 %   DL/VA 4.09 ml/min/mmHg/L   DL/VA % pred 811 %      Assessment & Plan:     ICD-10-CM   1. Moderate persistent asthma without complication  J45.40     2. Shortness of breath  R06.02    IMPROVED    3. Acute bronchitis, unspecified organism  J20.9      Meds ordered this encounter  Medications   albuterol (VENTOLIN HFA) 108 (90 Base) MCG/ACT inhaler    Sig: Inhale 1-2 puffs into the lungs every 6 (six) hours as needed for wheezing or shortness of breath.    Dispense:  8 g    Refill:  6   azithromycin (ZITHROMAX) 250 MG tablet    Sig: Take 2 tablets (500 mg) on  Day 1,  followed by 1 tablet (250 mg) once daily on Days 2 through 5.    Dispense:  6 each    Refill:  0   Discussion:    Asthma Asthma, managed with Symbicort. Previously trialed Trelegy but discontinued due to taste preference. Symbicort is well-tolerated and effective in controlling symptoms. No wheezing noted on examination. Discussed chronic nature of asthma, importance of medication adherence, and having an emergency inhaler for acute symptoms. - Continue Symbicort - Refill for Symbicort sent previously - Prescribe albuterol inhaler for emergency use  Acute Bronchitis Acute bronchitis likely secondary to recent URI, evidenced by productive cough with initially green sputum, now yellow/clear. Mild turbulence noted on auscultation. Discussed azithromycin (Z-Pak) to address bronchitis and reduce cough. Explained dosage regimen and importance of completing the course. - Prescribe azithromycin (Z-Pak): 2 pills on the first day, then 1 pill daily for 4 days  Hypertension Hypertension, currently managed. No specific issues discussed during this visit. - Monitor blood pressure regularly  Follow-up - Schedule follow-up appointment in 4 months.      Advised if symptoms do not improve or worsen, to  please contact office for sooner follow up or seek emergency care.    I spent 32 minutes of dedicated to the care of this patient on the date of this encounter to include pre-visit review of records, face-to-face time with the patient discussing conditions above, post visit ordering of testing, clinical documentation with the electronic health record, making appropriate referrals as documented, and communicating necessary findings to members of the patients care team.     C. Danice Goltz, MD Advanced Bronchoscopy PCCM [facility]    *This note was generated using voice recognition software/Dragon and/or AI transcription program.  Despite best efforts to proofread, errors can occur which can change the meaning. Any transcriptional errors that result from this process are unintentional and may not be fully corrected at the time of dictation.

## 2023-09-20 NOTE — Progress Notes (Signed)
Patient was seen in the office today and Dr. Jayme Cloud went over the PFTs with her at that time.  Nothing further needed.

## 2023-09-21 ENCOUNTER — Ambulatory Visit: Payer: Medicare HMO | Admitting: Surgical

## 2023-10-10 ENCOUNTER — Encounter: Payer: Self-pay | Admitting: Surgical

## 2023-10-10 ENCOUNTER — Ambulatory Visit: Payer: Medicare HMO | Admitting: Surgical

## 2023-10-10 DIAGNOSIS — M5442 Lumbago with sciatica, left side: Secondary | ICD-10-CM | POA: Diagnosis not present

## 2023-10-10 DIAGNOSIS — G8929 Other chronic pain: Secondary | ICD-10-CM | POA: Diagnosis not present

## 2023-10-10 MED ORDER — DIAZEPAM 5 MG PO TABS
ORAL_TABLET | ORAL | 0 refills | Status: DC
Start: 2023-10-10 — End: 2023-11-09

## 2023-10-10 NOTE — Progress Notes (Cosign Needed)
Follow-up Office Visit Note   Patient: Amy Ray           Date of Birth: Jun 15, 1946           MRN: [id number] Visit Date: 10/10/2023 Requested by: Allwardt, Crist Infante, PA-C [address] [place],  [place] [address] PCP: Bary Leriche, PA-C  Subjective: Chief Complaint  Patient presents with   Left Leg - Pain    HPI: Amy Ray is a 77 y.o. female who returns to the office for follow-up visit.    Plan at last visit was: Patient is a 77 year old female who presents complaining of 6 months of occasional radicular leg pain in both legs.  First began as low back pain though the low back pain has improved and now the leg symptoms remain.  Reviewed radiographs with her today.  No red flag signs or symptoms.  We discussed options availed the patient including physical therapy versus anti-inflammatories versus MRI scan with plan for ESI's versus other medications.  Cannot take anti-inflammatories because she takes Plavix.  Plan to try occasional gabapentin and if patient has worsening symptoms or little improvement, she will reach back out and I think the next step would be either trying physical therapy or getting MRI scan.  Follow-up as needed.   Since then, patient notes over the last 3 weeks, she has had significant return of her radicular leg pain that primarily bothers her left leg.  Occasionally will have some right sided radicular pain but most of her pain is radiating down from the low back into the left buttock and down to the left ankle.  She denies any history of prior lumbar spine surgery.  She does have low back pain but this is not every day.  No numbness or tingling.  The radicular leg pain does bother her every day and is overall much more painful than the low back pain she has intermittently.  She notes it is difficult to sit on the toilet at times due to the low back pain and buttock pain.  She denies any new bowel or bladder incontinence but she does describe what  sounds like functional incontinence due to taking a diuretic medication.  This functional urinary incontinence has been going on for about 3 months.  No saddle anesthesia.  She ambulates with a rollator.              ROS: All systems reviewed are negative as they relate to the chief complaint within the history of present illness.  Patient denies fevers or chills.  Assessment & Plan: Visit Diagnoses:  1. Chronic left-sided low back pain with left-sided sciatica     Plan: Amy Ray is a 77 y.o. female who returns to the office for follow-up visit.  Plan from last visit was noted above in HPI.  They now return with return of radicular leg pain after temporary resolution following her visit on 08/05/2023.  With return of similar symptoms, we re-reviewed lumbar spine radiographs from previous appointment.  She would like to proceed with MRI as we have discussed previously.  We will order MRI of the lumbar spine for further evaluation of left leg radicular symptoms.  She does take Plavix with history of stroke in 2006 but she has come off of this medication multiple times for various procedures.  Anticipate she will need to discontinue her Plavix temporarily to try lumbar spine ESI based on the results of the MRI.  Follow-up after MRI to review results.  Prescribed Valium to help with claustrophobia during scan.  Follow-Up Instructions: No follow-ups on file.   Orders:  Orders Placed This Encounter  Procedures   MR Lumbar Spine w/o contrast   No orders of the defined types were placed in this encounter.     Procedures: No procedures performed   Clinical Data: No additional findings.  Objective: Vital Signs: There were no vitals taken for this visit.  Physical Exam:  Constitutional: Patient appears well-developed HEENT:  Head: Normocephalic Eyes:EOM are normal Neck: Normal range of motion Cardiovascular: Normal rate Pulmonary/chest: Effort normal Neurologic: Patient is  alert Skin: Skin is warm Psychiatric: Patient has normal mood and affect  Ortho Exam: Ortho exam demonstrates positive straight leg raise on right, negative on left.  Intact hip flexion, quadricep, hamstring, dorsiflexion, plantarflexion strength rated 5/5.  No clonus noted bilaterally.  No pain with hip range of motion bilaterally.  Specialty Comments:  No specialty comments available.  Imaging: No results found.   PMFS History: Patient Active Problem List   Diagnosis Date Noted   Chronic pain of both knees 01/21/2023   Bilateral lower extremity edema 01/21/2023   History of reverse total replacement of right shoulder joint 01/11/2023   Osteoarthritis of right shoulder 01/11/2023   Glaucoma 01/11/2023   Gastroesophageal reflux disease 01/11/2023   History of shoulder surgery 01/11/2023   Facial neuralgia 01/11/2023   Pain in joint of right shoulder 07/16/2022   Mild cognitive impairment 02/08/2022   Mild late onset Alzheimer's dementia (HCC) 11/11/2021   Vitamin D deficiency 09/21/2020   Hypokalemia 08/05/2020   History of total bilateral knee replacement 06/20/2018   Low back pain 05/20/2015   Fatigue 05/20/2015   History of CVA (cerebrovascular accident) 05/20/2015   Insomnia 05/20/2015   Morbid obesity (HCC) 05/20/2015   Positive H. pylori test 05/20/2015   Tremor 05/20/2015   Dysfunction of eustachian tube 05/20/2015   Diverticulosis 05/20/2015   Essential (primary) hypertension 07/17/2007   Alterations of sensations, late effect of cerebrovascular disease 03/11/2005   Hyperlipidemia 10/12/2003   Trigeminal neuralgia 10/12/2003   Past Medical History:  Diagnosis Date   Alzheimers disease (HCC)    Anticoagulant long-term use    plavix--- manged by pcp   Chronic constipation    Diverticulosis of colon    Generalized abdominal pain    Glaucoma, both eyes    Hemorrhoids    History of CVA with residual deficit 03/16/2005   left facial paresthesia;  per pt note  from stroke but in neurologist note is cva residual   History of diverticulitis of colon 05/20/2015   History of lower GI bleeding    Hyperlipidemia, mixed    Hypertension    per previous had nuclear stress test w/ cardiologist w/ dr Juliann Pares , results in care everywhere , NUC 02-01-2017  normal , ef 60%;  echo 02-02-2017 ef 50% mild AR/TR; and normal stress echo   MCI (mild cognitive impairment)    neurology--- Marlowe Kays PA ([facility]);  per note seconardy to vascular and alzeimer disease;  per MRI moderate to extensive CSV ischemia   Nocturia    OA (osteoarthritis)    PMB (postmenopausal bleeding)    Thickened endometrium    Trigeminal neuralgia of left side of face    per pt started after stroke   Vertigo    Wears partial dentures    upper    Family History  Problem Relation Age of Onset   Stroke Mother    Obesity Mother  Hypertension Mother    Alzheimer's disease Father    Diabetes Father    Hypertension Sister    Hypertension Sister    Hypertension Sister    Hypertension Sister    Hypertension Sister    Breast cancer Sister 70   Diabetes Brother    Breast cancer Maternal Aunt 82   Breast cancer Maternal Aunt     Past Surgical History:  Procedure Laterality Date   CATARACT EXTRACTION W/ INTRAOCULAR LENS IMPLANT Bilateral    right 2023;  left , yrs ago   CHOLECYSTECTOMY     CHOLECYSTECTOMY, LAPAROSCOPIC  2000   HYSTEROSCOPY WITH D & C N/A 12/20/2022   Procedure: DILATATION AND CURETTAGE;  Surgeon: Lyn Henri, MD;  Location:  [facility];  Service: Gynecology;  Laterality: N/A;   REVERSE SHOULDER ARTHROPLASTY Bilateral    right 06-25-2022 in [place];   approx.  2017  left side in [place]   TOTAL KNEE ARTHROPLASTY Bilateral    2004;  2005   UMBILICAL HERNIA REPAIR     child   Social History   Occupational History   Occupation: Retired  Tobacco Use   Smoking status: Never   Smokeless tobacco: Never  Vaping Use   Vaping status:  Never Used  Substance and Sexual Activity   Alcohol use: No    Alcohol/week: 0.0 standard drinks of alcohol   Drug use: Never   Sexual activity: Not on file

## 2023-10-11 ENCOUNTER — Telehealth: Payer: Self-pay | Admitting: *Deleted

## 2023-10-11 NOTE — Telephone Encounter (Signed)
 Copied from CRM [id number]. Topic: Clinical - Medical Advice >> Oct 11, 2023 10:38 AM Elizebeth Brooking wrote: Reason for CRM: Patient stated that she is going to [facility] , and they will be checking her left breast as well.

## 2023-10-11 NOTE — Telephone Encounter (Signed)
 Copied from CRM [id number]. Topic: Clinical - Request for Lab/Test Order >> Oct 11, 2023 10:48 AM Viola F wrote: Reason for CRM: Patient called says [facility] needs order/phone call for her mammogram in order to schedule her  Patient returned call stating GI needs order to be placed so imaging can complete mammogram please advise if this is for screening or diagnostic that needs to be placed or pace order for patient.

## 2023-10-11 NOTE — Telephone Encounter (Signed)
Noted and agreed, thank you. 

## 2023-10-11 NOTE — Telephone Encounter (Signed)
 Returned patient call to get more information. Pt was very upset since calling the office two times for this order. Explained to the patient we had no information other than an order was needed to complete a screening mammogram. Pt stating she is having itching in left breast and feeling an abnormality as well as breast seems to be smaller. Please advise on order for diagnostic mammogram for the patient.

## 2023-10-13 NOTE — Telephone Encounter (Signed)
 Please call patient and advise needs appt with PCP; schedule patient

## 2023-10-13 NOTE — Telephone Encounter (Signed)
 Please see pt response as Amy Ray

## 2023-10-14 ENCOUNTER — Encounter: Payer: Self-pay | Admitting: Physician Assistant

## 2023-10-14 ENCOUNTER — Ambulatory Visit (INDEPENDENT_AMBULATORY_CARE_PROVIDER_SITE_OTHER): Payer: Medicare HMO | Admitting: Physician Assistant

## 2023-10-14 VITALS — BP 130/76 | HR 84 | Temp 97.0°F | Ht 60.0 in | Wt 207.4 lb

## 2023-10-14 DIAGNOSIS — Z803 Family history of malignant neoplasm of breast: Secondary | ICD-10-CM | POA: Diagnosis not present

## 2023-10-14 DIAGNOSIS — N644 Mastodynia: Secondary | ICD-10-CM | POA: Diagnosis not present

## 2023-10-14 NOTE — Progress Notes (Signed)
 Patient ID: Amy Ray, female    DOB: Feb 03, 1946, 78 y.o.   MRN: [id number]   Assessment & Plan:  Breast pain, left -     US  BREAST COMPLETE UNI LEFT INC AXILLA; Future  Family history of breast cancer in sister -     US  BREAST COMPLETE UNI LEFT INC AXILLA; Future   Assessment and Plan    Breast Concern Patient reports a sensation in her left breast, with intermittent itching but no pain, discharge, or skin changes. Family history of breast cancer in ?three sisters per patient. Last mammogram was normal in September 2024. Physical exam today did not reveal any palpable masses or concerning findings. -Order breast ultrasound for further evaluation due to patient's concern and family history of breast cancer.         No follow-ups on file.    Subjective:    Chief Complaint  Patient presents with   Breast Problem    Pt in office due to calling last week and requesting an order for diagnostic mammogram order being sent and PCP requested pt to come in to discuss; left breast issue, and feels tiny balls that is hard inside, pt is high risk due to family hx of breast cancer, denies pain; pt states itching on left beast as well    HPI Discussed the use of AI scribe software for clinical note transcription with the patient, who gave verbal consent to proceed. Here with daughter, Amy Ray, today.   History of Present Illness   The patient, with a significant family history of breast cancer, presents with left breast discomfort and itching. She reports a sensation of 'something' in her left breast, which she initially attributed to wearing a tight bra. The discomfort has since resolved after discontinuing the tight bra. She also reports intermittent itching in the same area, which has been ongoing for approximately a month. She denies any skin changes, nipple discharge, or other systemic symptoms such as night sweats or unintentional weight loss. She had a normal mammogram in  September.       Past Medical History:  Diagnosis Date   Alzheimers disease (HCC)    Anticoagulant long-term use    plavix --- manged by pcp   Asthma    Chronic constipation    Diverticulosis of colon    Generalized abdominal pain    Glaucoma, both eyes    Hemorrhoids    History of CVA with residual deficit 03/16/2005   left facial paresthesia;  per pt note from stroke but in neurologist note is cva residual   History of diverticulitis of colon 05/20/2015   History of lower GI bleeding    Hyperlipidemia, mixed    Hypertension    per previous had nuclear stress test w/ cardiologist w/ dr florencio , results in care everywhere , NUC 02-01-2017  normal , ef 60%;  echo 02-02-2017 ef 50% mild AR/TR; and normal stress echo   MCI (mild cognitive impairment)    neurology--- camie sevin PA ([facility]);  per note seconardy to vascular and alzeimer disease;  per MRI moderate to extensive CSV ischemia   Nocturia    OA (osteoarthritis)    PMB (postmenopausal bleeding)    Thickened endometrium    Trigeminal neuralgia of left side of face    per pt started after stroke   Vertigo    Wears partial dentures    upper    Past Surgical History:  Procedure Laterality Date   CATARACT EXTRACTION W/ INTRAOCULAR  LENS IMPLANT Bilateral    right 2023;  left , yrs ago   CHOLECYSTECTOMY     CHOLECYSTECTOMY, LAPAROSCOPIC  2000   HYSTEROSCOPY WITH D & C N/A 12/20/2022   Procedure: DILATATION AND CURETTAGE;  Surgeon: Lequita Evalene LABOR, MD;  Location: [facility];  Service: Gynecology;  Laterality: N/A;   REVERSE SHOULDER ARTHROPLASTY Bilateral    right 06-25-2022 in [place];   approx.  2017  left side in [place]   TOTAL KNEE ARTHROPLASTY Bilateral    2004;  2005   UMBILICAL HERNIA REPAIR     child    Family History  Problem Relation Age of Onset   Stroke Mother    Obesity Mother    Hypertension Mother    Alzheimer's disease Father    Diabetes Father    Hypertension Sister     Hypertension Sister    Hypertension Sister    Hypertension Sister    Hypertension Sister    Breast cancer Sister 74   Diabetes Brother    Breast cancer Maternal Aunt 4   Breast cancer Maternal Aunt     Social History   Tobacco Use   Smoking status: Never   Smokeless tobacco: Never  Vaping Use   Vaping status: Never Used  Substance Use Topics   Alcohol use: No    Alcohol/week: 0.0 standard drinks of alcohol   Drug use: Never     Allergies  Allergen Reactions   Augmentin  [Amoxicillin -Pot Clavulanate] Hives   Quinapril-Hydrochlorothiazide  Other (See Comments) and Cough    Other reaction(s): Headache     Tramadol     Dizzy, headache, nosebleed, near-syncope   Silicone Rash    Review of Systems NEGATIVE UNLESS OTHERWISE INDICATED IN HPI      Objective:     BP 130/76 (BP Location: Left Arm, Patient Position: Sitting, Cuff Size: Large)   Pulse 84   Temp (!) 97 F (36.1 C) (Temporal)   Ht 5' (1.524 m)   Wt 207 lb 6.4 oz (94.1 kg)   SpO2 97%   BMI 40.51 kg/m   Wt Readings from Last 3 Encounters:  10/14/23 207 lb 6.4 oz (94.1 kg)  09/20/23 210 lb (95.3 kg)  08/03/23 214 lb 12.8 oz (97.4 kg)    BP Readings from Last 3 Encounters:  10/14/23 130/76  09/20/23 130/84  08/16/23 138/74     Physical Exam Vitals and nursing note reviewed. Exam conducted with a chaperone present.  Constitutional:      Appearance: Normal appearance.  Cardiovascular:     Rate and Rhythm: Normal rate and regular rhythm.  Pulmonary:     Effort: Pulmonary effort is normal.     Breath sounds: Normal breath sounds.  Chest:     Chest wall: No mass, lacerations, deformity, swelling or tenderness.  Breasts:    Breasts are symmetrical.     Right: Normal.     Left: Normal.  Lymphadenopathy:     Upper Body:     Right upper body: No supraclavicular, axillary or pectoral adenopathy.     Left upper body: No supraclavicular, axillary or pectoral adenopathy.  Skin:    Findings: No  bruising, lesion or rash.  Neurological:     Mental Status: She is alert.  Psychiatric:        Mood and Affect: Mood normal.        Taria Castrillo M Ramirez Fullbright, PA-C

## 2023-10-14 NOTE — Patient Instructions (Signed)
 VISIT SUMMARY:  Today, we discussed your recent left breast discomfort and itching. You mentioned that the discomfort resolved after you stopped wearing a tight bra, but the itching has persisted for about a month. Your last mammogram in September was normal, and today's physical exam did not show any concerning findings.  YOUR PLAN:  -BREAST CONCERN: You have been experiencing a sensation and intermittent itching in your left breast. Given your family history of breast cancer, we are taking extra precautions. Although your last mammogram was normal and today's exam did not show any issues, we will order a breast ultrasound to further evaluate your symptoms and ensure everything is okay.  INSTRUCTIONS:  Please schedule a breast ultrasound at your earliest convenience. Follow up with us  after the ultrasound for further evaluation and to discuss the results.

## 2023-10-17 ENCOUNTER — Other Ambulatory Visit: Payer: Self-pay | Admitting: Physician Assistant

## 2023-10-17 DIAGNOSIS — Z803 Family history of malignant neoplasm of breast: Secondary | ICD-10-CM

## 2023-10-17 DIAGNOSIS — N644 Mastodynia: Secondary | ICD-10-CM

## 2023-10-25 ENCOUNTER — Institutional Professional Consult (permissible substitution): Payer: Medicare HMO | Admitting: Internal Medicine

## 2023-11-03 ENCOUNTER — Encounter: Payer: Self-pay | Admitting: Physician Assistant

## 2023-11-03 ENCOUNTER — Ambulatory Visit
Admission: RE | Admit: 2023-11-03 | Discharge: 2023-11-03 | Disposition: A | Discharge status: Home / Self Care | Payer: Medicare HMO | Source: Ambulatory Visit | Attending: Physician Assistant | Admitting: Physician Assistant

## 2023-11-03 DIAGNOSIS — N6459 Other signs and symptoms in breast: Secondary | ICD-10-CM | POA: Diagnosis not present

## 2023-11-03 DIAGNOSIS — Z803 Family history of malignant neoplasm of breast: Secondary | ICD-10-CM

## 2023-11-03 DIAGNOSIS — R92322 Mammographic fibroglandular density, left breast: Secondary | ICD-10-CM | POA: Diagnosis not present

## 2023-11-03 DIAGNOSIS — N644 Mastodynia: Secondary | ICD-10-CM

## 2023-11-07 ENCOUNTER — Encounter: Payer: Self-pay | Admitting: Orthopedic Surgery

## 2023-11-08 ENCOUNTER — Telehealth: Payer: Self-pay | Admitting: Surgical

## 2023-11-08 NOTE — Telephone Encounter (Signed)
Patient daughter Amy Ray called back asking if her mother's medication for the MRI has been sent to the pharmacy? Darlene said she called earlier today as well.  Patient uses Therapist, occupational at [facility].   Amy Ray said the MRI is schedule Friday. The number to contact Amy Ray is [phone]

## 2023-11-09 ENCOUNTER — Other Ambulatory Visit: Payer: Self-pay | Admitting: Surgical

## 2023-11-09 ENCOUNTER — Ambulatory Visit (INDEPENDENT_AMBULATORY_CARE_PROVIDER_SITE_OTHER): Payer: Medicare HMO

## 2023-11-09 VITALS — BP 140/87 | Ht 60.0 in | Wt 199.0 lb

## 2023-11-09 DIAGNOSIS — Z0000 Encounter for general adult medical examination without abnormal findings: Secondary | ICD-10-CM | POA: Diagnosis not present

## 2023-11-09 MED ORDER — DIAZEPAM 5 MG PO TABS: ORAL_TABLET | ORAL | 0 refills | Status: DC

## 2023-11-09 NOTE — Telephone Encounter (Signed)
Sent to [facility] on [address]

## 2023-11-09 NOTE — Progress Notes (Signed)
Because this visit was a virtual/telehealth visit,  certain criteria was not obtained, such a blood pressure, CBG if applicable, and timed get up and go. Any medications not marked as "taking" were not mentioned during the medication reconciliation part of the visit. Any vitals not documented were not able to be obtained due to this being a telehealth visit or patient was unable to self-report a recent blood pressure reading due to a lack of equipment at home via telehealth. Vitals that have been documented are verbally provided by the patient.  Interactive audio and video telecommunications were attempted between this provider and patient, however failed, due to patient having technical difficulties OR patient did not have access to video capability.  We continued and completed visit with audio only.  Subjective:   Amy Ray is a 78 y.o. female who presents for Medicare Annual (Subsequent) preventive examination.  Visit Complete: Virtual I connected with  Amy Ray on 11/09/23 by a audio enabled telemedicine application and verified that I am speaking with the correct person using two identifiers.  Patient Location: Home  Provider Location: Home Office  I discussed the limitations of evaluation and management by telemedicine. The patient expressed understanding and agreed to proceed.  Vital Signs: Because this visit was a virtual/telehealth visit, some criteria may be missing or patient reported. Any vitals not documented were not able to be obtained and vitals that have been documented are patient reported.  Patient Medicare AWV questionnaire was completed by the patient on na; I have confirmed that all information answered by patient is correct and no changes since this date.  Cardiac Risk Factors include: advanced age (>61men, >68 women);dyslipidemia;hypertension;obesity (BMI >30kg/m2)     Objective:    Today's Vitals   11/09/23 1435  BP: (!) 140/87  Weight: 199 lb (90.3 kg)   Height: 5' (1.524 m)   Body mass index is 38.86 kg/m.     11/09/2023    2:34 PM 08/16/2023    9:53 AM 07/31/2023   10:14 PM 07/03/2023    7:52 AM 12/20/2022   10:32 AM 10/24/2022    8:02 AM 10/22/2022   10:56 AM  Advanced Directives  Does Patient Have a Medical Advance Directive? No No No No No Yes Yes  Type of Advance Directive      Living will;Healthcare Power of [employer] Power of [place];Living will  Copy of Healthcare Power of Attorney in Chart?       No - copy requested  Would patient like information on creating a medical advance directive? No - Patient declined  No - Patient declined  No - Patient declined      Current Medications (verified) Outpatient Encounter Medications as of 11/09/2023  Medication Sig   Acetaminophen (TYLENOL EX ST ARTHRITIS PAIN PO) Take 2 tablets by mouth daily as needed (for arthritis pain).   albuterol (VENTOLIN HFA) 108 (90 Base) MCG/ACT inhaler Inhale 1-2 puffs into the lungs every 6 (six) hours as needed for wheezing or shortness of breath.   budesonide-formoterol (SYMBICORT) 160-4.5 MCG/ACT inhaler Inhale 2 puffs into the lungs 2 (two) times daily.   carbamazepine (TEGRETOL) 200 MG tablet TAKE 1 TABLET TWICE DAILY   clopidogrel (PLAVIX) 75 MG tablet Take 1 tablet (75 mg total) by mouth daily.   diazepam (VALIUM) 5 MG tablet Take 1 tablet 30 to 60 minutes prior to MRI scan.  Do not operate motor vehicle while taking this medication.   diltiazem (DILACOR XR) 180 MG 24 hr capsule TAKE 2  CAPSULES EVERY DAY   famotidine (PEPCID) 40 MG tablet Take 1 tablet (40 mg total) by mouth at bedtime.   furosemide (LASIX) 20 MG tablet Take 1 tablet (20 mg total) by mouth daily.   gabapentin (NEURONTIN) 100 MG capsule Take 1 capsule (100 mg total) by mouth 2 (two) times daily as needed.   HYDROcodone-acetaminophen (NORCO/VICODIN) 5-325 MG tablet Take 1 tablet by mouth every 6 (six) hours as needed for moderate pain (pain score 4-6).   nortriptyline  (PAMELOR) 10 MG capsule Take 1 capsule (10 mg total) by mouth at bedtime. TAKE 1 TO 2 CAPSULES BY MOUTH AT BEDTIME Strength: 10 mg   polyethylene glycol (MIRALAX / GLYCOLAX) 17 g packet Take 17 g by mouth 2 (two) times daily. (Patient taking differently: Take 17 g by mouth daily as needed for mild constipation.)   SIMBRINZA 1-0.2 % SUSP Place 1 drop into both eyes 2 (two) times daily.   simvastatin (ZOCOR) 10 MG tablet Take 1 tablet (10 mg total) by mouth at bedtime.   VITAMIN D PO Take 1 capsule by mouth daily.   aspirin 81 MG tablet Take 1 tablet by mouth daily. (Patient not taking: Reported on 10/14/2023)   predniSONE (DELTASONE) 10 MG tablet 2 po once a day for 3 days, then 1 po once a day for 3 days (Patient not taking: Reported on 11/09/2023)   No facility-administered encounter medications on file as of 11/09/2023.    Allergies (verified) Augmentin [amoxicillin-pot clavulanate], Quinapril-hydrochlorothiazide, Tramadol, and Silicone   History: Past Medical History:  Diagnosis Date   Alzheimers disease (HCC)    Anticoagulant long-term use    plavix--- manged by pcp   Asthma    Chronic constipation    Diverticulosis of colon    Generalized abdominal pain    Glaucoma, both eyes    Hemorrhoids    History of CVA with residual deficit 03/16/2005   left facial paresthesia;  per pt note from stroke but in neurologist note is cva residual   History of diverticulitis of colon 05/20/2015   History of lower GI bleeding    Hyperlipidemia, mixed    Hypertension    per previous had nuclear stress test w/ cardiologist w/ dr Juliann Pares , results in care everywhere , NUC 02-01-2017  normal , ef 60%;  echo 02-02-2017 ef 50% mild AR/TR; and normal stress echo   MCI (mild cognitive impairment)    neurology--- Marlowe Kays PA ([facility]);  per note seconardy to vascular and alzeimer disease;  per MRI moderate to extensive CSV ischemia   Nocturia    OA (osteoarthritis)    PMB (postmenopausal  bleeding)    Thickened endometrium    Trigeminal neuralgia of left side of face    per pt started after stroke   Vertigo    Wears partial dentures    upper   Past Surgical History:  Procedure Laterality Date   CATARACT EXTRACTION W/ INTRAOCULAR LENS IMPLANT Bilateral    right 2023;  left , yrs ago   CHOLECYSTECTOMY     CHOLECYSTECTOMY, LAPAROSCOPIC  2000   HYSTEROSCOPY WITH D & C N/A 12/20/2022   Procedure: DILATATION AND CURETTAGE;  Surgeon: Lyn Henri, MD;  Location: [facility];  Service: Gynecology;  Laterality: N/A;   REVERSE SHOULDER ARTHROPLASTY Bilateral    right 06-25-2022 in [place];   approx.  2017  left side in [place]   TOTAL KNEE ARTHROPLASTY Bilateral    2004;  2005   UMBILICAL HERNIA  REPAIR     child   Family History  Problem Relation Age of Onset   Stroke Mother    Obesity Mother    Hypertension Mother    Alzheimer's disease Father    Diabetes Father    Hypertension Sister    Hypertension Sister    Hypertension Sister    Hypertension Sister    Hypertension Sister    Breast cancer Sister 55   Diabetes Brother    Breast cancer Maternal Aunt 37   Breast cancer Maternal Aunt    Social History   Socioeconomic History   Marital status: Married    Spouse name: Not on file   Number of children: 4   Years of education: Not on file   Highest education level: High school graduate  Occupational History   Occupation: Retired  Tobacco Use   Smoking status: Never   Smokeless tobacco: Never  Vaping Use   Vaping status: Never Used  Substance and Sexual Activity   Alcohol use: No    Alcohol/week: 0.0 standard drinks of alcohol   Drug use: Never   Sexual activity: Not on file  Other Topics Concern   Not on file  Social History Narrative   Not on file   Social Drivers of Health   Financial Resource Strain: Low Risk  (11/09/2023)   Overall Financial Resource Strain (CARDIA)    Difficulty of Paying Living Expenses: Not hard at all   Food Insecurity: No Food Insecurity (11/09/2023)   Hunger Vital Sign    Worried About Running Out of Food in the Last Year: Never true    Ran Out of Food in the Last Year: Never true  Transportation Needs: No Transportation Needs (11/09/2023)   PRAPARE - Administrator, Civil Service (Medical): No    Lack of Transportation (Non-Medical): No  Physical Activity: Sufficiently Active (11/09/2023)   Exercise Vital Sign    Days of Exercise per Week: 7 days    Minutes of Exercise per Session: 30 min  Stress: No Stress Concern Present (11/09/2023)   [employer] of Occupational Health - Occupational Stress Questionnaire    Feeling of Stress : Not at all  Social Connections: Moderately Integrated (11/09/2023)   Social Connection and Isolation Panel [NHANES]    Frequency of Communication with Friends and Family: More than three times a week    Frequency of Social Gatherings with Friends and Family: More than three times a week    Attends Religious Services: More than 4 times per year    Active Member of [employer] or Organizations: Yes    Attends Engineer, structural: More than 4 times per year    Marital Status: Separated    Tobacco Counseling Counseling given: Yes   Clinical Intake:  Pre-visit preparation completed: Yes  Pain : No/denies pain     BMI - recorded: 38.86 Nutritional Status: BMI > 30  Obese Nutritional Risks: None Diabetes: No  How often do you need to have someone help you when you read instructions, pamphlets, or other written materials from your doctor or pharmacy?: 1 - Never  Interpreter Needed?: No  Information entered by :: Maryjean Ka CMA   Activities of Daily Living    11/09/2023    2:46 PM 12/20/2022   10:40 AM  In your present state of health, do you have any difficulty performing the following activities:  Hearing? 0 1  Vision? 0 0  Difficulty concentrating or making decisions? 0 0  Walking  or climbing stairs? 0 0  Dressing or  bathing? 0 0  Doing errands, shopping? 0   Preparing Food and eating ? N   Using the Toilet? N   In the past six months, have you accidently leaked urine? N   Do you have problems with loss of bowel control? N   Managing your Medications? N   Managing your Finances? N   Housekeeping or managing your Housekeeping? N     Patient Care Team: Allwardt, Crist Infante, PA-C as PCP - General (Physician Assistant) Jake Bathe, MD as PCP - Cardiology (Cardiology) Isla Pence, OD as Consulting Physician (Optometry) Deeann Saint, MD (Orthopedic Surgery) Alwyn Pea, MD as Consulting Physician (Cardiology)  Indicate any recent Medical Services you may have received from other than [facility] providers in the past year (date may be approximate).     Assessment:   This is a routine wellness examination for [place].  Hearing/Vision screen Hearing Screening - Comments:: Patient denies any hearing difficulties.   Vision Screening - Comments:: Wears rx glasses - up to date with routine eye exams  [facility] Bascom [facility]   Goals Addressed             This Visit's Progress    Patient Stated       Remain active and healthy        Depression Screen    11/09/2023    2:47 PM 10/14/2023   11:15 AM 05/11/2023   10:17 AM 01/21/2023    9:54 AM 10/22/2022   10:48 AM 10/15/2021   11:07 AM 06/27/2020    9:00 AM  PHQ 2/9 Scores  PHQ - 2 Score 0 0 2 0 0 0 2  PHQ- 9 Score 0 0 5    8    Fall Risk    11/09/2023    2:46 PM 10/14/2023   11:15 AM 05/11/2023   10:17 AM 01/21/2023    9:53 AM 10/22/2022   10:50 AM  Fall Risk   Falls in the past year? 0 0  0 1  Number falls in past yr: 0 0 0 0 1  Injury with Fall? 0 0 0 0 0  Risk for fall due to : No Fall Risks No Fall Risks No Fall Risks No Fall Risks History of fall(s);Impaired mobility;Impaired balance/gait  Follow up Falls prevention discussed Falls evaluation completed Falls evaluation completed Falls evaluation  completed Falls prevention discussed    MEDICARE RISK AT HOME: Medicare Risk at Home Any stairs in or around the home?: No If so, are there any without handrails?: No Home free of loose throw rugs in walkways, pet beds, electrical cords, etc?: Yes Adequate lighting in your home to reduce risk of falls?: Yes Life alert?: Yes Use of a cane, walker or w/c?: Yes Grab bars in the bathroom?: Yes Shower chair or bench in shower?: Yes Elevated toilet seat or a handicapped toilet?: Yes  TIMED UP AND GO:  Was the test performed?  No    Cognitive Function:    10/19/2021   11:35 AM  MMSE - Mini Mental State Exam  Orientation to time 5  Orientation to Place 5  Registration 3  Attention/ Calculation 5  Recall 3  Language- name 2 objects 2  Language- repeat 1  Language- follow 3 step command 3  Language- read & follow direction 1  Write a sentence 1  Copy design 1  Total score 30  11/11/2021    8:00 AM  Montreal Cognitive Assessment   Visuospatial/ Executive (0/5) 3  Naming (0/3) 2  Attention: Read list of digits (0/2) 2  Attention: Read list of letters (0/1) 1  Attention: Serial 7 subtraction starting at 100 (0/3) 0  Language: Repeat phrase (0/2) 2  Language : Fluency (0/1) 0  Abstraction (0/2) 1  Delayed Recall (0/5) 1  Orientation (0/6) 5  Total 17  Adjusted Score (based on education) 18      11/09/2023    2:37 PM 10/22/2022   10:52 AM 10/15/2021   11:12 AM 06/25/2019    9:30 AM  6CIT Screen  What Year? 0 points 0 points 0 points 0 points  What month? 0 points 0 points 0 points 0 points  What time? 0 points 0 points  0 points  Count back from 20 0 points 0 points  0 points  Months in reverse 0 points 0 points  0 points  Repeat phrase 0 points 0 points  2 points  Total Score 0 points 0 points  2 points    Immunizations Immunization History  Administered Date(s) Administered   Fluad Quad(high Dose 65+) 06/27/2020, 07/17/2021, 06/13/2023   Influenza, High Dose  Seasonal PF 07/12/2016, 06/16/2017, 06/21/2018, 06/22/2019, 07/14/2022   Influenza-Unspecified 05/12/2015   Moderna Covid-19 Vaccine Bivalent Booster 25yrs & up 05/11/2021   Moderna SARS-COV2 Booster Vaccination 08/07/2020, 05/11/2021   Moderna Sars-Covid-2 Vaccination 11/24/2019, 12/24/2019, 08/07/2020   Pfizer(Comirnaty)Fall Seasonal Vaccine 12 years and older 07/14/2022, 06/13/2023   Pneumococcal Conjugate-13 12/05/2014   Pneumococcal Polysaccharide-23 05/09/2013   Tdap 08/12/2011   Zoster Recombinant(Shingrix) 07/20/2021, 09/25/2021    TDAP status: Up to date  Flu Vaccine status: Up to date  Pneumococcal vaccine status: Up to date  Covid-19 vaccine status: Completed vaccines  Qualifies for Shingles Vaccine? No   Zostavax completed No   Shingrix Completed?: Yes  Screening Tests Health Maintenance  Topic Date Due   Medicare Annual Wellness (AWV)  10/23/2023   DEXA SCAN  08/06/2024   Pneumonia Vaccine 71+ Years old  Completed   INFLUENZA VACCINE  Completed   COVID-19 Vaccine  Completed   Hepatitis C Screening  Completed   Zoster Vaccines- Shingrix  Completed   HPV VACCINES  Aged Out   DTaP/Tdap/Td  Discontinued   Colonoscopy  Discontinued   Fecal DNA (Cologuard)  Discontinued    Health Maintenance  Health Maintenance Due  Topic Date Due   Medicare Annual Wellness (AWV)  10/23/2023    Colorectal cancer screening: No longer required.   Mammogram status: Completed 06/21/2023. Repeat every year  Bone Density status: Completed 08/07/2019. Results reflect: Bone density results: NORMAL. Repeat every 5 years.  Lung Cancer Screening: (Low Dose CT Chest recommended if Age 1-80 years, 20 pack-year currently smoking OR have quit w/in 15years.) does not qualify.   Lung Cancer Screening Referral: na  Additional Screening:  Hepatitis C Screening: does not qualify; Completed   Vision Screening: Recommended annual ophthalmology exams for early detection of glaucoma and  other disorders of the eye. Is the patient up to date with their annual eye exam?  Yes  Who is the provider or what is the name of the office in which the patient attends annual eye exams? [facility] If pt is not established with a provider, would they like to be referred to a provider to establish care? No .   Dental Screening: Recommended annual dental exams for proper oral hygiene  Diabetic Foot Exam: na  Community Resource Referral / Chronic Care Management: CRR required this visit?  No   CCM required this visit?  No     Plan:     I have personally reviewed and noted the following in the patient's chart:   Medical and social history Use of alcohol, tobacco or illicit drugs  Current medications and supplements including opioid prescriptions. Patient is not currently taking opioid prescriptions. Functional ability and status Nutritional status Physical activity Advanced directives List of other physicians Hospitalizations, surgeries, and ER visits in previous 12 months Vitals Screenings to include cognitive, depression, and falls Referrals and appointments  In addition, I have reviewed and discussed with patient certain preventive protocols, quality metrics, and best practice recommendations. A written personalized care plan for preventive services as well as general preventive health recommendations were provided to patient.     Jordan Hawks Vedha Tercero, CMA   11/09/2023   After Visit Summary: (Mail) Due to this being a telephonic visit, the after visit summary with patients personalized plan was offered to patient via mail   Nurse Notes: na

## 2023-11-09 NOTE — Telephone Encounter (Signed)
I called and advised.

## 2023-11-09 NOTE — Patient Instructions (Signed)
Ms. Lakins , Thank you for taking time to come for your Medicare Wellness Visit. I appreciate your ongoing commitment to your health goals. Please review the following plan we discussed and let me know if I can assist you in the future.   Referrals/Orders/Follow-Ups/Clinician Recommendations:  Next Medicare Annual Wellness Visit:November 12, 2024 at 9:20 am virtual visit  This is a list of the screening recommended for you and due dates:  Health Maintenance  Topic Date Due   Mammogram  06/20/2024   DEXA scan (bone density measurement)  08/06/2024   Medicare Annual Wellness Visit  11/08/2024   Pneumonia Vaccine  Completed   Flu Shot  Completed   COVID-19 Vaccine  Completed   Hepatitis C Screening  Completed   Zoster (Shingles) Vaccine  Completed   HPV Vaccine  Aged Out   DTaP/Tdap/Td vaccine  Discontinued   Colon Cancer Screening  Discontinued   Cologuard (Stool DNA test)  Discontinued    Advanced directives: (ACP Link)Information on Advanced Care Planning can be found at [facility] Advance Health Care Directives Advance Health Care Directives ([email or web address]   Next Medicare Annual Wellness Visit scheduled for next year: yes  Preventive Care 65 Years and Older, Female Preventive care refers to lifestyle choices and visits with your health care provider that can promote health and wellness. Preventive care visits are also called wellness exams. What can I expect for my preventive care visit? Counseling Your health care provider may ask you questions about your: Medical history, including: Past medical problems. Family medical history. Pregnancy and menstrual history. History of falls. Current health, including: Memory and ability to understand (cognition). Emotional well-being. Home life and relationship well-being. Sexual activity and sexual health. Lifestyle, including: Alcohol, nicotine or tobacco, and drug use. Access to firearms. Diet, exercise, and  sleep habits. Work and work Astronomer. Sunscreen use. Safety issues such as seatbelt and bike helmet use. Physical exam Your health care provider will check your: Height and weight. These may be used to calculate your BMI (body mass index). BMI is a measurement that tells if you are at a healthy weight. Waist circumference. This measures the distance around your waistline. This measurement also tells if you are at a healthy weight and may help predict your risk of certain diseases, such as type 2 diabetes and high blood pressure. Heart rate and blood pressure. Body temperature. Skin for abnormal spots. What immunizations do I need?  Vaccines are usually given at various ages, according to a schedule. Your health care provider will recommend vaccines for you based on your age, medical history, and lifestyle or other factors, such as travel or where you work. What tests do I need? Screening Your health care provider may recommend screening tests for certain conditions. This may include: Lipid and cholesterol levels. Hepatitis C test. Hepatitis B test. HIV (human immunodeficiency virus) test. STI (sexually transmitted infection) testing, if you are at risk. Lung cancer screening. Colorectal cancer screening. Diabetes screening. This is done by checking your blood sugar (glucose) after you have not eaten for a while (fasting). Mammogram. Talk with your health care provider about how often you should have regular mammograms. BRCA-related cancer screening. This may be done if you have a family history of breast, ovarian, tubal, or peritoneal cancers. Bone density scan. This is done to screen for osteoporosis. Talk with your health care provider about your test results, treatment options, and if necessary, the need for more tests. Follow these instructions at home: Eating  and drinking  Eat a diet that includes fresh fruits and vegetables, whole grains, lean protein, and low-fat dairy  products. Limit your intake of foods with high amounts of sugar, saturated fats, and salt. Take vitamin and mineral supplements as recommended by your health care provider. Do not drink alcohol if your health care provider tells you not to drink. If you drink alcohol: Limit how much you have to 0-1 drink a day. Know how much alcohol is in your drink. In the [place] one drink equals one 12 oz bottle of beer (355 mL), one 5 oz glass of wine (148 mL), or one 1 oz glass of hard liquor (44 mL). Lifestyle Brush your teeth every morning and night with fluoride toothpaste. Floss one time each day. Exercise for at least 30 minutes 5 or more days each week. Do not use any products that contain nicotine or tobacco. These products include cigarettes, chewing tobacco, and vaping devices, such as e-cigarettes. If you need help quitting, ask your health care provider. Do not use drugs. If you are sexually active, practice safe sex. Use a condom or other form of protection in order to prevent STIs. Take aspirin only as told by your health care provider. Make sure that you understand how much to take and what form to take. Work with your health care provider to find out whether it is safe and beneficial for you to take aspirin daily. Ask your health care provider if you need to take a cholesterol-lowering medicine (statin). Find healthy ways to manage stress, such as: Meditation, yoga, or listening to music. Journaling. Talking to a trusted person. Spending time with friends and family. Minimize exposure to UV radiation to reduce your risk of skin cancer. Safety Always wear your seat belt while driving or riding in a vehicle. Do not drive: If you have been drinking alcohol. Do not ride with someone who has been drinking. When you are tired or distracted. While texting. If you have been using any mind-altering substances or drugs. Wear a helmet and other protective equipment during sports activities. If you  have firearms in your house, make sure you follow all gun safety procedures. What's next? Visit your health care provider once a year for an annual wellness visit. Ask your health care provider how often you should have your eyes and teeth checked. Stay up to date on all vaccines. This information is not intended to replace advice given to you by your health care provider. Make sure you discuss any questions you have with your health care provider. Document Revised: 03/25/2021 Document Reviewed: 03/25/2021 Elsevier Patient Education  2024 [employer].  Understanding Your Risk for Falls Millions of people have serious injuries from falls each year. It is important to understand your risk of falling. Talk with your health care provider about your risk and what you can do to lower it. If you do have a serious fall, make sure to tell your provider. Falling once raises your risk of falling again. How can falls affect me? Serious injuries from falls are common. These include: Broken bones, such as hip fractures. Head injuries, such as traumatic brain injuries (TBI) or concussions. A fear of falling can cause you to avoid activities and stay at home. This can make your muscles weaker and raise your risk for a fall. What can increase my risk? There are a number of risk factors that increase your risk for falling. The more risk factors you have, the higher your risk of falling.  Serious injuries from a fall happen most often to people who are older than 77 years old. Teenagers and young adults ages 66-29 are also at higher risk. Common risk factors include: Weakness in the lower body. Being generally weak or confused due to long-term (chronic) illness. Dizziness or balance problems. Poor vision. Medicines that cause dizziness or drowsiness. These may include: Medicines for your blood pressure, heart, anxiety, insomnia, or swelling (edema). Pain medicines. Muscle relaxants. Other risk factors  include: Drinking alcohol. Having had a fall in the past. Having foot pain or wearing improper footwear. Working at a dangerous job. Having any of the following in your home: Tripping hazards, such as floor clutter or loose rugs. Poor lighting. Pets. Having dementia or memory loss. What actions can I take to lower my risk of falling?     Physical activity Stay physically fit. Do strength and balance exercises. Consider taking a regular class to build strength and balance. Yoga and tai chi are good options. Vision Have your eyes checked every year and your prescription for glasses or contacts updated as needed. Shoes and walking aids Wear non-skid shoes. Wear shoes that have rubber soles and low heels. Do not wear high heels. Do not walk around the house in socks or slippers. Use a cane or walker as told by your provider. Home safety Attach secure railings on both sides of your stairs. Install grab bars for your bathtub, shower, and toilet. Use a non-skid mat in your bathtub or shower. Attach bath mats securely with double-sided, non-slip rug tape. Use good lighting in all rooms. Keep a flashlight near your bed. Make sure there is a clear path from your bed to the bathroom. Use night-lights. Do not use throw rugs. Make sure all carpeting is taped or tacked down securely. Remove all clutter from walkways and stairways, including extension cords. Repair uneven or broken steps and floors. Avoid walking on icy or slippery surfaces. Walk on the grass instead of on icy or slick sidewalks. Use ice melter to get rid of ice on walkways in the winter. Use a cordless phone. Questions to ask your health care provider Can you help me check my risk for a fall? Do any of my medicines make me more likely to fall? Should I take a vitamin D supplement? What exercises can I do to improve my strength and balance? Should I make an appointment to have my vision checked? Do I need a bone density test  to check for weak bones (osteoporosis)? Would it help to use a cane or a walker? Where to find more information Centers for Disease Control and Prevention, STEADI: TonerPromos.no Community-Based Fall Prevention Programs: TonerPromos.no [employer] on Aging: BaseRingTones.pl Contact a health care provider if: You fall at home. You are afraid of falling at home. You feel weak, drowsy, or dizzy. This information is not intended to replace advice given to you by your health care provider. Make sure you discuss any questions you have with your health care provider. Document Revised: 05/31/2022 Document Reviewed: 05/31/2022 Elsevier Patient Education  2024 [employer].

## 2023-11-10 ENCOUNTER — Other Ambulatory Visit: Payer: Self-pay | Admitting: Physician Assistant

## 2023-11-11 ENCOUNTER — Ambulatory Visit
Admission: RE | Admit: 2023-11-11 | Discharge: 2023-11-11 | Disposition: A | Discharge status: Home / Self Care | Payer: Medicare HMO | Source: Ambulatory Visit | Attending: Orthopedic Surgery | Admitting: Orthopedic Surgery

## 2023-11-11 DIAGNOSIS — M4316 Spondylolisthesis, lumbar region: Secondary | ICD-10-CM | POA: Diagnosis not present

## 2023-11-11 DIAGNOSIS — G8929 Other chronic pain: Secondary | ICD-10-CM

## 2023-11-11 DIAGNOSIS — M48061 Spinal stenosis, lumbar region without neurogenic claudication: Secondary | ICD-10-CM | POA: Diagnosis not present

## 2023-11-27 NOTE — Progress Notes (Signed)
Please have her follow-up with Franky Macho or we could potentially call her to get her set up with epidural steroid injections with Dr. Alvester Morin or an appointment with Dr. Christell Constant per her preference.  Thanks

## 2023-12-02 ENCOUNTER — Other Ambulatory Visit: Payer: Self-pay

## 2023-12-02 DIAGNOSIS — G8929 Other chronic pain: Secondary | ICD-10-CM

## 2023-12-02 NOTE — Progress Notes (Signed)
Based on the last note, I think she wanted to try lumbar spine ESI.  Can we get this set up for her?

## 2023-12-07 ENCOUNTER — Ambulatory Visit (INDEPENDENT_AMBULATORY_CARE_PROVIDER_SITE_OTHER): Payer: Medicare HMO | Admitting: Surgical

## 2023-12-07 ENCOUNTER — Encounter: Payer: Self-pay | Admitting: Orthopedic Surgery

## 2023-12-07 DIAGNOSIS — G8929 Other chronic pain: Secondary | ICD-10-CM

## 2023-12-07 DIAGNOSIS — M5442 Lumbago with sciatica, left side: Secondary | ICD-10-CM

## 2023-12-07 NOTE — Progress Notes (Signed)
 Office Visit Note   Patient: Amy Ray           Date of Birth: Sep 02, 1946           MRN: [id number] Visit Date: 12/07/2023 Requested by: Allwardt, Crist Infante, PA-C [address] [place],  [place] [address] PCP: Bary Leriche, PA-C  Subjective: Chief Complaint  Patient presents with   Other    Review MRI    HPI: Amy Ray is a 78 y.o. female who presents to the office for MRI review.  Patient reports that her radicular leg pain that was present at the last visit has resolved on its own.  She denies any bowel or bladder incontinence.  No leg weakness bilaterally.  She does not even remember when the pain resolved, overall she is pretty pleased with how her leg symptoms are doing right now.  MRI results revealed: MR Lumbar Spine w/o contrast Result Date: 11/27/2023 CLINICAL DATA:  Left side low back pain. Bilateral lower extremity pain. EXAM: MRI LUMBAR SPINE WITHOUT CONTRAST TECHNIQUE: Multiplanar, multisequence MR imaging of the lumbar spine was performed. No intravenous contrast was administered. COMPARISON:  Plain films lumbar spine 08/05/2023. FINDINGS: Segmentation:  Standard. Alignment: 0.6 cm anterolisthesis L4 on L5 is due to facet degenerative disease. Vertebrae:  No fracture, evidence of discitis, or bone lesion. Conus medullaris and cauda equina: Conus extends to the L1-2 level. Conus and cauda equina appear normal. Paraspinal and other soft tissues: Negative. Disc levels: T11-12: Shallow disc bulge and mild-to-moderate facet arthropathy. No stenosis. T12-L1: Moderate facet degenerative disease.  Otherwise negative. L1-2: Shallow disc bulge and moderate facet arthropathy. No stenosis. L2-3: Mild-to-moderate facet degenerative change minimal disc bulging without stenosis. L3-4: Moderate facet degenerative change with a shallow disc bulge and ligamentum flavum thickening. There is mild central canal narrowing. The foramina are open. L4-5: Advanced facet degenerative change  is present. There is bulky ligamentum flavum thickening. The disc is uncovered with a shallow bulge. There is severe central canal stenosis and moderate bilateral foraminal narrowing. L5-S1: Partial autologous fusion across the disc spaces identified. Endplate spurring is seen. The central canal and foramina are open. IMPRESSION: 1. Severe central canal stenosis and moderate bilateral foraminal narrowing at L4-5 due to advanced facet degenerative disease, bulky ligamentum flavum thickening and 0.6 cm anterolisthesis. 2. Mild central canal narrowing L3-4. Electronically Signed   By: Drusilla Kanner M.D.   On: 11/27/2023 09:20                 ROS: All systems reviewed are negative as they relate to the chief complaint within the history of present illness.  Patient denies fevers or chills.  Assessment & Plan: Visit Diagnoses:  1. Chronic left-sided low back pain with left-sided sciatica     Plan: Amy Ray is a 78 y.o. female who presents to the office for review of lumbar spine MRI.  MRI demonstrates significant central canal stenosis at L4-L5 with bilateral foraminal stenosis at the same level.  However, with her lack of symptoms no intervention necessary at this time.  Did recommend that if she has return of symptoms, she just call the office and we can get her set up for lumbar spine ESI with Dr. Alvester Morin.  She agreed with plan and she will follow-up with the office as needed.  Follow-Up Instructions: No follow-ups on file.   Orders:  No orders of the defined types were placed in this encounter.  No orders of the defined types were placed  in this encounter.     Procedures: No procedures performed   Clinical Data: No additional findings.  Objective: Vital Signs: There were no vitals taken for this visit.  Physical Exam:  Constitutional: Patient appears well-developed HEENT:  Head: Normocephalic Eyes:EOM are normal Neck: Normal range of motion Cardiovascular: Normal  rate Pulmonary/chest: Effort normal Neurologic: Patient is alert Skin: Skin is warm Psychiatric: Patient has normal mood and affect  Ortho Exam: Ortho exam demonstrates intact hip flexion, quadricep, hamstring, dorsiflexion, plantarflexion strength rated 5/5.  Negative straight leg raise bilaterally.  Specialty Comments:  MRI LUMBAR SPINE WITHOUT CONTRAST   TECHNIQUE: Multiplanar, multisequence MR imaging of the lumbar spine was performed. No intravenous contrast was administered.   COMPARISON:  Plain films lumbar spine 08/05/2023.   FINDINGS: Segmentation:  Standard.   Alignment: 0.6 cm anterolisthesis L4 on L5 is due to facet degenerative disease.   Vertebrae:  No fracture, evidence of discitis, or bone lesion.   Conus medullaris and cauda equina: Conus extends to the L1-2 level. Conus and cauda equina appear normal.   Paraspinal and other soft tissues: Negative.   Disc levels:   T11-12: Shallow disc bulge and mild-to-moderate facet arthropathy. No stenosis.   T12-L1: Moderate facet degenerative disease.  Otherwise negative.   L1-2: Shallow disc bulge and moderate facet arthropathy. No stenosis.   L2-3: Mild-to-moderate facet degenerative change minimal disc bulging without stenosis.   L3-4: Moderate facet degenerative change with a shallow disc bulge and ligamentum flavum thickening. There is mild central canal narrowing. The foramina are open.   L4-5: Advanced facet degenerative change is present. There is bulky ligamentum flavum thickening. The disc is uncovered with a shallow bulge. There is severe central canal stenosis and moderate bilateral foraminal narrowing.   L5-S1: Partial autologous fusion across the disc spaces identified. Endplate spurring is seen. The central canal and foramina are open.   IMPRESSION: 1. Severe central canal stenosis and moderate bilateral foraminal narrowing at L4-5 due to advanced facet degenerative disease,  bulky ligamentum flavum thickening and 0.6 cm anterolisthesis. 2. Mild central canal narrowing L3-4.     Electronically Signed   By: Drusilla Kanner M.D.   On: 11/27/2023 09:20  Imaging: No results found.   PMFS History: Patient Active Problem List   Diagnosis Date Noted   Chronic pain of both knees 01/21/2023   Bilateral lower extremity edema 01/21/2023   History of reverse total replacement of right shoulder joint 01/11/2023   Osteoarthritis of right shoulder 01/11/2023   Glaucoma 01/11/2023   Gastroesophageal reflux disease 01/11/2023   History of shoulder surgery 01/11/2023   Facial neuralgia 01/11/2023   Pain in joint of right shoulder 07/16/2022   Mild cognitive impairment 02/08/2022   Mild late onset Alzheimer's dementia (HCC) 11/11/2021   Vitamin D deficiency 09/21/2020   Hypokalemia 08/05/2020   History of total bilateral knee replacement 06/20/2018   Low back pain 05/20/2015   Fatigue 05/20/2015   History of CVA (cerebrovascular accident) 05/20/2015   Insomnia 05/20/2015   Morbid obesity (HCC) 05/20/2015   Positive H. pylori test 05/20/2015   Tremor 05/20/2015   Dysfunction of eustachian tube 05/20/2015   Diverticulosis 05/20/2015   Essential (primary) hypertension 07/17/2007   Alterations of sensations, late effect of cerebrovascular disease 03/11/2005   Hyperlipidemia 10/12/2003   Trigeminal neuralgia 10/12/2003   Past Medical History:  Diagnosis Date   Alzheimers disease (HCC)    Anticoagulant long-term use    plavix--- manged by pcp   Asthma  Chronic constipation    Diverticulosis of colon    Generalized abdominal pain    Glaucoma, both eyes    Hemorrhoids    History of CVA with residual deficit 03/16/2005   left facial paresthesia;  per pt note from stroke but in neurologist note is cva residual   History of diverticulitis of colon 05/20/2015   History of lower GI bleeding    Hyperlipidemia, mixed    Hypertension    per previous had  nuclear stress test w/ cardiologist w/ dr Juliann Pares , results in care everywhere , NUC 02-01-2017  normal , ef 60%;  echo 02-02-2017 ef 50% mild AR/TR; and normal stress echo   MCI (mild cognitive impairment)    neurology--- Marlowe Kays PA ([facility]);  per note seconardy to vascular and alzeimer disease;  per MRI moderate to extensive CSV ischemia   Nocturia    OA (osteoarthritis)    PMB (postmenopausal bleeding)    Thickened endometrium    Trigeminal neuralgia of left side of face    per pt started after stroke   Vertigo    Wears partial dentures    upper    Family History  Problem Relation Age of Onset   Stroke Mother    Obesity Mother    Hypertension Mother    Alzheimer's disease Father    Diabetes Father    Hypertension Sister    Hypertension Sister    Hypertension Sister    Hypertension Sister    Hypertension Sister    Breast cancer Sister 61   Diabetes Brother    Breast cancer Maternal Aunt 55   Breast cancer Maternal Aunt     Past Surgical History:  Procedure Laterality Date   CATARACT EXTRACTION W/ INTRAOCULAR LENS IMPLANT Bilateral    right 2023;  left , yrs ago   CHOLECYSTECTOMY     CHOLECYSTECTOMY, LAPAROSCOPIC  2000   HYSTEROSCOPY WITH D & C N/A 12/20/2022   Procedure: DILATATION AND CURETTAGE;  Surgeon: Lyn Henri, MD;  Location: [facility];  Service: Gynecology;  Laterality: N/A;   REVERSE SHOULDER ARTHROPLASTY Bilateral    right 06-25-2022 in [place];   approx.  2017  left side in [place]   TOTAL KNEE ARTHROPLASTY Bilateral    2004;  2005   UMBILICAL HERNIA REPAIR     child   Social History   Occupational History   Occupation: Retired  Tobacco Use   Smoking status: Never   Smokeless tobacco: Never  Vaping Use   Vaping status: Never Used  Substance and Sexual Activity   Alcohol use: No    Alcohol/week: 0.0 standard drinks of alcohol   Drug use: Never   Sexual activity: Not on file

## 2023-12-22 DIAGNOSIS — H524 Presbyopia: Secondary | ICD-10-CM | POA: Diagnosis not present

## 2023-12-22 DIAGNOSIS — H4062X0 Glaucoma secondary to drugs, left eye, stage unspecified: Secondary | ICD-10-CM | POA: Diagnosis not present

## 2023-12-22 DIAGNOSIS — H401111 Primary open-angle glaucoma, right eye, mild stage: Secondary | ICD-10-CM | POA: Diagnosis not present

## 2023-12-24 ENCOUNTER — Emergency Department (HOSPITAL_COMMUNITY)

## 2023-12-24 ENCOUNTER — Encounter (HOSPITAL_COMMUNITY): Payer: Self-pay | Admitting: *Deleted

## 2023-12-24 ENCOUNTER — Emergency Department (HOSPITAL_COMMUNITY)
Admission: EM | Admit: 2023-12-24 | Discharge: 2023-12-24 | Disposition: A | Discharge status: Home / Self Care | Attending: Emergency Medicine | Admitting: Emergency Medicine

## 2023-12-24 ENCOUNTER — Other Ambulatory Visit: Payer: Self-pay

## 2023-12-24 DIAGNOSIS — M542 Cervicalgia: Secondary | ICD-10-CM | POA: Diagnosis not present

## 2023-12-24 DIAGNOSIS — J45909 Unspecified asthma, uncomplicated: Secondary | ICD-10-CM | POA: Diagnosis not present

## 2023-12-24 DIAGNOSIS — I10 Essential (primary) hypertension: Secondary | ICD-10-CM | POA: Diagnosis not present

## 2023-12-24 DIAGNOSIS — S199XXA Unspecified injury of neck, initial encounter: Secondary | ICD-10-CM | POA: Diagnosis not present

## 2023-12-24 DIAGNOSIS — R0789 Other chest pain: Secondary | ICD-10-CM | POA: Diagnosis not present

## 2023-12-24 DIAGNOSIS — Z79899 Other long term (current) drug therapy: Secondary | ICD-10-CM | POA: Insufficient documentation

## 2023-12-24 DIAGNOSIS — R519 Headache, unspecified: Secondary | ICD-10-CM | POA: Insufficient documentation

## 2023-12-24 DIAGNOSIS — J4520 Mild intermittent asthma, uncomplicated: Secondary | ICD-10-CM

## 2023-12-24 DIAGNOSIS — Z7982 Long term (current) use of aspirin: Secondary | ICD-10-CM | POA: Insufficient documentation

## 2023-12-24 DIAGNOSIS — S0990XA Unspecified injury of head, initial encounter: Secondary | ICD-10-CM | POA: Diagnosis not present

## 2023-12-24 DIAGNOSIS — D649 Anemia, unspecified: Secondary | ICD-10-CM | POA: Insufficient documentation

## 2023-12-24 DIAGNOSIS — I6782 Cerebral ischemia: Secondary | ICD-10-CM | POA: Diagnosis not present

## 2023-12-24 DIAGNOSIS — R0602 Shortness of breath: Secondary | ICD-10-CM | POA: Diagnosis not present

## 2023-12-24 LAB — BASIC METABOLIC PANEL
Anion gap: 11 (ref 5–15)
BUN: 16 mg/dL (ref 8–23)
CO2: 27 mmol/L (ref 22–32)
Calcium: 8.5 mg/dL — ABNORMAL LOW (ref 8.9–10.3)
Chloride: 104 mmol/L (ref 98–111)
Creatinine, Ser: 0.89 mg/dL (ref 0.44–1.00)
GFR, Estimated: >60 mL/min (ref >60)
Glucose, Bld: 98 mg/dL (ref 70–99)
Potassium: 3.5 mmol/L (ref 3.5–5.1)
Sodium: 142 mmol/L (ref 135–145)

## 2023-12-24 LAB — TROPONIN I (HIGH SENSITIVITY): Troponin I (High Sensitivity): 6 ng/L (ref <18)

## 2023-12-24 LAB — CBC
HCT: 31.7 % — ABNORMAL LOW (ref 36.0–46.0)
Hemoglobin: 10.7 g/dL — ABNORMAL LOW (ref 12.0–15.0)
MCH: 34.4 pg — ABNORMAL HIGH (ref 26.0–34.0)
MCHC: 33.8 g/dL (ref 30.0–36.0)
MCV: 101.9 fL — ABNORMAL HIGH (ref 80.0–100.0)
Platelets: 176 10*3/uL (ref 150–400)
RBC: 3.11 MIL/uL — ABNORMAL LOW (ref 3.87–5.11)
RDW: 12.5 % (ref 11.5–15.5)
WBC: 4.9 10*3/uL (ref 4.0–10.5)
nRBC: 0 % (ref 0.0–0.2)

## 2023-12-24 MED ORDER — ACETAMINOPHEN 325 MG PO TABS
650.0000 mg | ORAL_TABLET | Freq: Once | ORAL | Status: AC
  Administered 2023-12-24: 650 mg via ORAL
  Filled 2023-12-24: qty 2

## 2023-12-24 NOTE — ED Triage Notes (Signed)
 Patient presents to ED with c/o sob and palpitations. States she has had pain in the back of her head and down her neck for several days, states she fell last week. States she woke with this and felt like she couldn't catch her breath and her heart was racing. States she is feeling better now.

## 2023-12-24 NOTE — Discharge Instructions (Addendum)
 It was a pleasure caring for you today.  As discussed, please follow-up with your cardiologist and primary care provider.  Seek emergency care if experiencing any new or worsening symptoms.

## 2023-12-24 NOTE — ED Provider Notes (Signed)
 [place] EMERGENCY DEPARTMENT AT [facility] Provider Note   CSN: [id number] Arrival date & time: 12/24/23  4098     History  Chief Complaint  Patient presents with   Chest Pain   Palpitations   Shortness of Breath    Amy Ray is a 78 y.o. female with PMHx CVA, alzheimer's, asthma, chronic constipation, HLD, HTN, OA, chronic pain who presents to ED concerned for an episode of SOB and palpations when she woke up this morning. Lasted 10 min. Patient stating that she feels better now. Patient stating that she was also wheezing when she woke up so she took her albuterol inhaler which help resolved symptoms. Patient stating that she gets palpitations intermittently and follows up with Cardiologist who has been providing patient with reassurance.   Of note, patient stating that she has been having headaches and neck pain for several days. Patient stating that she believes that she is experiencing tension headaches because this is what her symptoms match up with on [employer]. States that her neck muscles feel tight. Excedrin has been resolving headache and neck pain. Patient stating that she had a fall last week. Patient fell on her right side after slipping and falling. Denies LOC, seizure. Endorses blood thinners. Denies vision changes.   Denies fever, chest pain, nausea, vomiting, diarrhea, dysuria, hematuria, hematochezia.    Chest Pain Associated symptoms: palpitations and shortness of breath   Palpitations Associated symptoms: chest pain and shortness of breath   Shortness of Breath Associated symptoms: chest pain        Home Medications Prior to Admission medications   Medication Sig Start Date End Date Taking? Authorizing Provider  Acetaminophen (TYLENOL EX ST ARTHRITIS PAIN PO) Take 2 tablets by mouth daily as needed (for arthritis pain).    [provider]  albuterol (VENTOLIN HFA) 108 (90 Base) MCG/ACT inhaler Inhale 1-2 puffs into the lungs every 6  (six) hours as needed for wheezing or shortness of breath. 09/20/23   Salena Saner, MD  aspirin 81 MG tablet Take 1 tablet by mouth daily. Patient not taking: Reported on 10/14/2023    [provider]  budesonide-formoterol (SYMBICORT) 160-4.5 MCG/ACT inhaler Inhale 2 puffs into the lungs 2 (two) times daily. 08/26/23   Salena Saner, MD  carbamazepine (TEGRETOL) 200 MG tablet TAKE 1 TABLET TWICE DAILY 09/12/23   Allwardt, Crist Infante, PA-C  clopidogrel (PLAVIX) 75 MG tablet Take 1 tablet (75 mg total) by mouth daily. 01/14/23   Allwardt, Crist Infante, PA-C  diazepam (VALIUM) 5 MG tablet Take 1 tablet 30 to 60 minutes prior to MRI scan.  Do not operate motor vehicle while taking this medication. 11/09/23   Magnant, Charles L, PA-C  diltiazem (DILACOR XR) 180 MG 24 hr capsule TAKE 2 CAPSULES EVERY DAY 08/29/23   Allwardt, Alyssa M, PA-C  famotidine (PEPCID) 40 MG tablet Take 1 tablet (40 mg total) by mouth at bedtime. 01/14/23   Allwardt, Crist Infante, PA-C  furosemide (LASIX) 20 MG tablet TAKE 1 TABLET EVERY DAY 11/10/23   Allwardt, Alyssa M, PA-C  gabapentin (NEURONTIN) 100 MG capsule Take 1 capsule (100 mg total) by mouth 2 (two) times daily as needed. 08/05/23   Magnant, Joycie Peek, PA-C  HYDROcodone-acetaminophen (NORCO/VICODIN) 5-325 MG tablet Take 1 tablet by mouth every 6 (six) hours as needed for moderate pain (pain score 4-6). 08/16/23   Cathren Laine, MD  nortriptyline (PAMELOR) 10 MG capsule Take 1 capsule (10 mg total) by mouth at bedtime.  TAKE 1 TO 2 CAPSULES BY MOUTH AT BEDTIME Strength: 10 mg 02/08/23   Allwardt, Alyssa M, PA-C  polyethylene glycol (MIRALAX / GLYCOLAX) 17 g packet Take 17 g by mouth 2 (two) times daily. Patient taking differently: Take 17 g by mouth daily as needed for mild constipation. 08/03/22   Charlynne Pander, MD  predniSONE (DELTASONE) 10 MG tablet 2 po once a day for 3 days, then 1 po once a day for 3 days Patient not taking: Reported on 11/09/2023 08/16/23    Cathren Laine, MD  [facility] 1-0.2 % SUSP Place 1 drop into both eyes 2 (two) times daily. 05/27/22   [provider]  simvastatin (ZOCOR) 10 MG tablet TAKE 1 TABLET AT BEDTIME 11/10/23   Allwardt, Alyssa M, PA-C  VITAMIN D PO Take 1 capsule by mouth daily.    [provider]      Allergies    Augmentin [amoxicillin-pot clavulanate], Quinapril-hydrochlorothiazide, Tramadol, and Silicone    Review of Systems   Review of Systems  Respiratory:  Positive for shortness of breath.   Cardiovascular:  Positive for chest pain and palpitations.    Physical Exam Updated Vital Signs BP (!) 116/57   Pulse 71   Resp 17   Ht 5' (1.524 m)   Wt 89.8 kg   SpO2 100%   BMI 38.67 kg/m  Physical Exam Vitals and nursing note reviewed.  Constitutional:      General: She is not in acute distress.    Appearance: She is not ill-appearing or toxic-appearing.  HENT:     Head: Normocephalic and atraumatic.     Mouth/Throat:     Mouth: Mucous membranes are moist.     Pharynx: No oropharyngeal exudate or posterior oropharyngeal erythema.  Eyes:     General: No scleral icterus.       Right eye: No discharge.        Left eye: No discharge.     Conjunctiva/sclera: Conjunctivae normal.  Cardiovascular:     Rate and Rhythm: Normal rate and regular rhythm.     Pulses: Normal pulses.     Heart sounds: Normal heart sounds. No murmur heard. Pulmonary:     Effort: Pulmonary effort is normal. No respiratory distress.     Breath sounds: Normal breath sounds. No wheezing, rhonchi or rales.  Abdominal:     General: Bowel sounds are normal.     Palpations: Abdomen is soft. There is no mass.     Tenderness: There is no abdominal tenderness.  Skin:    General: Skin is warm and dry.     Findings: No rash.  Neurological:     General: No focal deficit present.     Mental Status: She is alert and oriented to person, place, and time. Mental status is at baseline.     Comments: GCS 15. Speech is  goal oriented. No deficits appreciated to CN III-XII; Patient moves extremities without ataxia.   Psychiatric:        Mood and Affect: Mood normal.     ED Results / Procedures / Treatments   Labs (all labs ordered are listed, but only abnormal results are displayed) Labs Reviewed  BASIC METABOLIC PANEL - Abnormal; Notable for the following components:      Result Value   Calcium 8.5 (*)    All other components within normal limits  CBC - Abnormal; Notable for the following components:   RBC 3.11 (*)    Hemoglobin 10.7 (*)  HCT 31.7 (*)    MCV 101.9 (*)    MCH 34.4 (*)    All other components within normal limits  TROPONIN I (HIGH SENSITIVITY)  TROPONIN I (HIGH SENSITIVITY)    EKG EKG Interpretation Date/Time:  Saturday December 24 2023 06:32:26 EDT Ventricular Rate:  89 PR Interval:  236 QRS Duration:  84 QT Interval:  358 QTC Calculation: 435 R Axis:   54  Text Interpretation: Sinus rhythm with 1st degree A-V block Otherwise normal ECG When compared with ECG of 31-Jul-2023 22:17, PREVIOUS ECG IS PRESENT Confirmed by Kristine Royal [id number]) on 12/24/2023 7:18:34 AM  Radiology CT Head Wo Contrast Result Date: 12/24/2023 CLINICAL DATA:  Head trauma, minor EXAM: CT HEAD WITHOUT CONTRAST CT CERVICAL SPINE WITHOUT CONTRAST TECHNIQUE: Multidetector CT imaging of the head and cervical spine was performed following the standard protocol without intravenous contrast. Multiplanar CT image reconstructions of the cervical spine were also generated. RADIATION DOSE REDUCTION: This exam was performed according to the departmental dose-optimization program which includes automated exposure control, adjustment of the mA and/or kV according to patient size and/or use of iterative reconstruction technique. COMPARISON:  10/15/2022 head CT FINDINGS: CT HEAD FINDINGS Brain: No evidence of acute infarction, hemorrhage, hydrocephalus, extra-axial collection or mass lesion/mass effect. Low-density in the  cerebral white matter attributed to chronic small vessel ischemia. Vascular: No hyperdense vessel or unexpected calcification. Skull: Normal. Negative for fracture or focal lesion. Sinuses/Orbits: No acute finding. CT CERVICAL SPINE FINDINGS Alignment: Normal. Skull base and vertebrae: No acute fracture. No primary bone lesion or focal pathologic process. Soft tissues and spinal canal: No prevertebral fluid or swelling. No visible canal hematoma. Disc levels: Extensive degenerative endplate and facet spurring throughout the cervical spine. C3-4 and C4-5 ankylosis. Upper chest: No evidence of injury IMPRESSION: No evidence of acute intracranial or cervical spine injury. Electronically Signed   By: Tiburcio Pea M.D.   On: 12/24/2023 10:33   CT Cervical Spine Wo Contrast Result Date: 12/24/2023 CLINICAL DATA:  Head trauma, minor EXAM: CT HEAD WITHOUT CONTRAST CT CERVICAL SPINE WITHOUT CONTRAST TECHNIQUE: Multidetector CT imaging of the head and cervical spine was performed following the standard protocol without intravenous contrast. Multiplanar CT image reconstructions of the cervical spine were also generated. RADIATION DOSE REDUCTION: This exam was performed according to the departmental dose-optimization program which includes automated exposure control, adjustment of the mA and/or kV according to patient size and/or use of iterative reconstruction technique. COMPARISON:  10/15/2022 head CT FINDINGS: CT HEAD FINDINGS Brain: No evidence of acute infarction, hemorrhage, hydrocephalus, extra-axial collection or mass lesion/mass effect. Low-density in the cerebral white matter attributed to chronic small vessel ischemia. Vascular: No hyperdense vessel or unexpected calcification. Skull: Normal. Negative for fracture or focal lesion. Sinuses/Orbits: No acute finding. CT CERVICAL SPINE FINDINGS Alignment: Normal. Skull base and vertebrae: No acute fracture. No primary bone lesion or focal pathologic process. Soft  tissues and spinal canal: No prevertebral fluid or swelling. No visible canal hematoma. Disc levels: Extensive degenerative endplate and facet spurring throughout the cervical spine. C3-4 and C4-5 ankylosis. Upper chest: No evidence of injury IMPRESSION: No evidence of acute intracranial or cervical spine injury. Electronically Signed   By: Tiburcio Pea M.D.   On: 12/24/2023 10:33   DG Chest 2 View Result Date: 12/24/2023 CLINICAL DATA:  Short of breath beginning last night. EXAM: CHEST - 2 VIEW COMPARISON:  07/31/2023. FINDINGS: Cardiac silhouette is normal in size and configuration. No mediastinal or hilar masses. No evidence of adenopathy.  Clear lungs.  No pleural effusion or pneumothorax. Well-positioned bilateral reverse shoulder prostheses, stable. Skeletal structures are grossly intact. IMPRESSION: No active cardiopulmonary disease. Electronically Signed   By: Amie Portland M.D.   On: 12/24/2023 09:00    Procedures Procedures    Medications Ordered in ED Medications  acetaminophen (TYLENOL) tablet 650 mg (650 mg Oral Given 12/24/23 1113)    ED Course/ Medical Decision Making/ A&P                                 Medical Decision Making Amount and/or Complexity of Data Reviewed Radiology: ordered.   This patient presents to the ED for concern of palpitation, wheezing, SOB, this involves an extensive number of treatment options, and is a complaint that carries with it a high risk of complications and morbidity.  The differential diagnosis includes acute coronary syndrome, congestive heart failure, pericarditis, pneumonia, pulmonary embolism, tension pneumothorax, esophageal rupture, aortic dissection, cardiac tamponade, musculoskeletal   Co morbidities that complicate the patient evaluation  CVA, alzheimer's, asthma, chronic constipation, HLD, HTN, OA, chronic pain    Additional history obtained:  Outpatient Cardiologist Dr. Anne Fu   Problem List / ED Course / Critical  interventions / Medication management  Patient presented for short lived episode of palpitations, SOB, and wheezing that resolved with asthma medication at home.  Denies any other recent infectious symptoms.  Physical exam unremarkable.  Patient with stable vitals.  Denies chest pain. I Ordered, and personally interpreted labs.  BMP reassuring.  Troponin within normal limits.  CBC without leukocytosis.  There is mild anemia with hemoglobin at 10.7. The patient was maintained on a cardiac monitor.  I personally viewed and interpreted the EKG/cardiac monitored which showed an underlying rhythm of: Sinus rhythm. I ordered imaging studies including chest xray, CT head/cervical spine to assess for process contributing to patient's symptoms. I independently visualized and interpreted imaging which showed no acute process. I agree with the radiologist interpretation Physical exam and workup today appears reassuring.  I suspect patient had a mild asthma attack this morning as her symptoms resolved with her asthma medication.  Patient has been asymptomatic for many hours.  Shared all results with patient and family over at bedside.  Answered all questions.  Recommended following up with primary care and outpatient cardiologist.  Patient verbalized understanding of plan. Staffed with Dr. Rodena Medin who agrees with plan. I have reviewed the patients home medicines and have made adjustments as needed Patient was given return precautions. Patient stable for discharge at this time.  Patient verbalized understanding of plan.  Ddx:  These are considered less likely due to history of present illness and physical exam findings.  -Acute coronary syndrome: EKG and troponin reassuring  -Congestive heart failure: Physical exam reassuring - Pneumonia: lungs are clear to auscultation bilaterally - Pulmonary embolism: vitals stable, patient asymptomatic, physical exam reassuring -Pneumothorax: lungs are clear to auscultation  bilaterally -Aortic dissection: vital signs are stable, patient asymptomatic -Cardiac tamponade: absence of hypotension, JVD, and muffled heart sounds   Social Determinants of Health:  none         Final Clinical Impression(s) / ED Diagnoses Final diagnoses:  Mild intermittent asthma without complication    Rx / DC Orders ED Discharge Orders     None         Dorthy Cooler, [place] 12/24/23 1154    Wynetta Fines, MD 12/25/23 1005

## 2023-12-27 ENCOUNTER — Telehealth: Payer: Self-pay

## 2023-12-27 NOTE — Transitions of Care (Post Inpatient/ED Visit) (Signed)
 12/27/2023  Name: Amy Ray MRN: [id number] DOB: 08-Mar-1946  Today's TOC FU Call Status: Today's TOC FU Call Status:: Successful TOC FU Call Completed TOC FU Call Complete Date: 12/27/23 Patient's Name and Date of Birth confirmed.  Transition Care Management Follow-up Telephone Call Date of Discharge: 12/24/23 Discharge Facility: [facility]) Type of Discharge: Emergency Department Reason for ED Visit: Other: (asthma) How have you been since you were released from the hospital?: Better Any questions or concerns?: No  Items Reviewed: Did you receive and understand the discharge instructions provided?: Yes Medications obtained,verified, and reconciled?: Yes (Medications Reviewed) Any new allergies since your discharge?: No Dietary orders reviewed?: Yes Do you have support at home?: Yes People in Home: child(ren), adult  Medications Reviewed Today: Medications Reviewed Today     Reviewed by Karena Addison, LPN (Licensed Practical Nurse) on 12/27/23 at 1457  Med List Status: <None>   Medication Order Taking? Sig Documenting Provider Last Dose Status Informant  Acetaminophen (TYLENOL EX ST ARTHRITIS PAIN PO) [id number] No Take 2 tablets by mouth daily as needed (for arthritis pain). [provider] Taking Active Self  albuterol (VENTOLIN HFA) 108 (90 Base) MCG/ACT inhaler [id number] No Inhale 1-2 puffs into the lungs every 6 (six) hours as needed for wheezing or shortness of breath. Salena Saner, MD Taking Active   aspirin 81 MG tablet [id number] No Take 1 tablet by mouth daily.  Patient not taking: Reported on 10/14/2023   [provider] Not Taking Active Self           Med Note Carol Ada, Irma Newness Sep 08, 2015 12:11 PM)    budesonide-formoterol [facility]) 160-4.5 MCG/ACT inhaler [id number] No Inhale 2 puffs into the lungs 2 (two) times daily. Salena Saner, MD Taking Active   carbamazepine (TEGRETOL) 200 MG tablet [id number] No TAKE 1 TABLET  TWICE DAILY Allwardt, Alyssa M, PA-C Taking Active   clopidogrel (PLAVIX) 75 MG tablet [id number] No Take 1 tablet (75 mg total) by mouth daily. Allwardt, Crist Infante, PA-C Taking Active   diazepam (VALIUM) 5 MG tablet [id number] No Take 1 tablet 30 to 60 minutes prior to MRI scan.  Do not operate motor vehicle while taking this medication. Magnant, Joycie Peek, PA-C Taking Active   diltiazem (DILACOR XR) 180 MG 24 hr capsule [id number] No TAKE 2 CAPSULES EVERY DAY Allwardt, Alyssa M, PA-C Taking Active   famotidine (PEPCID) 40 MG tablet [id number] No Take 1 tablet (40 mg total) by mouth at bedtime. Allwardt, Crist Infante, PA-C Taking Active   furosemide (LASIX) 20 MG tablet [id number]  TAKE 1 TABLET EVERY DAY Allwardt, Alyssa M, PA-C  Active   gabapentin (NEURONTIN) 100 MG capsule [id number] No Take 1 capsule (100 mg total) by mouth 2 (two) times daily as needed. Magnant, Joycie Peek, PA-C Taking Active   HYDROcodone-acetaminophen (NORCO/VICODIN) 5-325 MG tablet [id number] No Take 1 tablet by mouth every 6 (six) hours as needed for moderate pain (pain score 4-6). Cathren Laine, MD Taking Active   nortriptyline (PAMELOR) 10 MG capsule [id number] No Take 1 capsule (10 mg total) by mouth at bedtime. TAKE 1 TO 2 CAPSULES BY MOUTH AT BEDTIME Strength: 10 mg Allwardt, Alyssa M, PA-C Taking Active   polyethylene glycol (MIRALAX / GLYCOLAX) 17 g packet [id number] No Take 17 g by mouth 2 (two) times daily.  Patient taking differently: Take 17 g by mouth daily as needed for mild constipation.   Charlynne Pander, MD Taking Active Self  predniSONE (  DELTASONE) 10 MG tablet [id number] No 2 po once a day for 3 days, then 1 po once a day for 3 days  Patient not taking: Reported on 11/09/2023   Cathren Laine, MD Not Taking Active   SIMBRINZA 1-0.2 % SUSP [id number] No Place 1 drop into both eyes 2 (two) times daily. [provider] Taking Active   simvastatin (ZOCOR) 10 MG tablet [id number]  TAKE 1 TABLET AT BEDTIME Allwardt,  Crist Infante, PA-C  Active   VITAMIN D PO [id number] No Take 1 capsule by mouth daily. [provider] Taking Active Self            Home Care and Equipment/Supplies: Were Home Health Services Ordered?: NA Any new equipment or medical supplies ordered?: NA  Functional Questionnaire: Do you need assistance with bathing/showering or dressing?: No Do you need assistance with meal preparation?: No Do you need assistance with eating?: No Do you have difficulty maintaining continence: No Do you need assistance with getting out of bed/getting out of a chair/moving?: No Do you have difficulty managing or taking your medications?: No  Follow up appointments reviewed: PCP Follow-up appointment confirmed?: Yes Date of PCP follow-up appointment?: 01/05/24 Follow-up Provider[facility] Follow-up appointment confirmed?: NA Do you need transportation to your follow-up appointment?: No Do you understand care options if your condition(s) worsen?: Yes-patient verbalized understanding    SIGNATURE Karena Addison, LPN [facility] Nurse Health Advisor Direct Dial [phone]

## 2024-01-05 ENCOUNTER — Inpatient Hospital Stay: Admitting: Physician Assistant

## 2024-01-06 ENCOUNTER — Ambulatory Visit (INDEPENDENT_AMBULATORY_CARE_PROVIDER_SITE_OTHER): Admitting: Physician Assistant

## 2024-01-06 VITALS — BP 128/78 | HR 82 | Temp 97.3°F | Ht 60.0 in | Wt 202.6 lb

## 2024-01-06 DIAGNOSIS — E782 Mixed hyperlipidemia: Secondary | ICD-10-CM

## 2024-01-06 DIAGNOSIS — D649 Anemia, unspecified: Secondary | ICD-10-CM | POA: Diagnosis not present

## 2024-01-06 DIAGNOSIS — J4520 Mild intermittent asthma, uncomplicated: Secondary | ICD-10-CM | POA: Insufficient documentation

## 2024-01-06 DIAGNOSIS — E538 Deficiency of other specified B group vitamins: Secondary | ICD-10-CM

## 2024-01-06 LAB — CBC WITH DIFFERENTIAL/PLATELET
Basophils Absolute: 0 10*3/uL (ref 0.0–0.1)
Basophils Relative: 0.7 % (ref 0.0–3.0)
Eosinophils Absolute: 0.1 10*3/uL (ref 0.0–0.7)
Eosinophils Relative: 2 % (ref 0.0–5.0)
HCT: 36.7 % (ref 36.0–46.0)
Hemoglobin: 12.4 g/dL (ref 12.0–15.0)
Lymphocytes Relative: 24.5 % (ref 12.0–46.0)
Lymphs Abs: 1.2 10*3/uL (ref 0.7–4.0)
MCHC: 33.9 g/dL (ref 30.0–36.0)
MCV: 102.8 fl — ABNORMAL HIGH (ref 78.0–100.0)
Monocytes Absolute: 0.6 10*3/uL (ref 0.1–1.0)
Monocytes Relative: 11 % (ref 3.0–12.0)
Neutro Abs: 3.1 10*3/uL (ref 1.4–7.7)
Neutrophils Relative %: 61.8 % (ref 43.0–77.0)
Platelets: 221 10*3/uL (ref 150.0–400.0)
RBC: 3.57 Mil/uL — ABNORMAL LOW (ref 3.87–5.11)
RDW: 13.1 % (ref 11.5–15.5)
WBC: 5.1 10*3/uL (ref 4.0–10.5)

## 2024-01-06 LAB — COMPREHENSIVE METABOLIC PANEL WITH GFR
ALT: 15 U/L (ref 0–35)
AST: 20 U/L (ref 0–37)
Albumin: 4.1 g/dL (ref 3.5–5.2)
Alkaline Phosphatase: 104 U/L (ref 39–117)
BUN: 12 mg/dL (ref 6–23)
CO2: 27 meq/L (ref 19–32)
Calcium: 9.1 mg/dL (ref 8.4–10.5)
Chloride: 107 meq/L (ref 96–112)
Creatinine, Ser: 0.92 mg/dL (ref 0.40–1.20)
GFR: 60.41 mL/min (ref >60.00)
Glucose, Bld: 93 mg/dL (ref 70–99)
Potassium: 4.1 meq/L (ref 3.5–5.1)
Sodium: 143 meq/L (ref 135–145)
Total Bilirubin: 0.4 mg/dL (ref 0.2–1.2)
Total Protein: 7.5 g/dL (ref 6.0–8.3)

## 2024-01-06 LAB — LIPID PANEL
Cholesterol: 164 mg/dL (ref 0–200)
HDL: 84 mg/dL (ref >39.00)
LDL Cholesterol: 69 mg/dL (ref 0–99)
NonHDL: 80.33
Total CHOL/HDL Ratio: 2
Triglycerides: 59 mg/dL (ref 0.0–149.0)
VLDL: 11.8 mg/dL (ref 0.0–40.0)

## 2024-01-06 LAB — IBC + FERRITIN
Ferritin: 75.1 ng/mL (ref 10.0–291.0)
Iron: 119 ug/dL (ref 42–145)
Saturation Ratios: 41.5 % (ref 20.0–50.0)
TIBC: 287 ug/dL (ref 250.0–450.0)
Transferrin: 205 mg/dL — ABNORMAL LOW (ref 212.0–360.0)

## 2024-01-06 LAB — VITAMIN B12: Vitamin B-12: 343 pg/mL (ref 211–911)

## 2024-01-06 NOTE — Progress Notes (Signed)
 Patient ID: Amy Ray, female    DOB: Jun 17, 1946, 78 y.o.   MRN: [id number]   Assessment & Plan:  Low hemoglobin -     CBC with Differential/Platelet -     IBC + Ferritin  B12 deficiency -     CBC with Differential/Platelet -     Vitamin B12  Mixed hyperlipidemia -     Lipid panel  Hypocalcemia -     Comprehensive metabolic panel with GFR  Mild intermittent asthma without complication      Assessment and Plan Assessment & Plan Asthma Follow-up for an ER visit due to shortness of breath and asthma exacerbation. She reports significant improvement. Currently using Symbicort twice daily and albuterol sulfate every six hours. Symptoms are well-controlled with current inhaler regimen. Advised to rinse mouth after using Symbicort to prevent thrush. - Continue Symbicort inhaler twice daily - Continue albuterol sulfate inhaler every six hours as needed - Advise rinsing mouth after using Symbicort  B12 Deficiency Hemoglobin levels have decreased from 12.5 to 10.7 over six months. Elevated MCV suggests possible B12 deficiency rather than iron deficiency. Reports memory issues, which could be related to B12 deficiency. No current use of multivitamins or B12 supplements. - Order B12 level - Order CBC, CMP, and ferritin level  Hyperlipidemia She is on simvastatin (Zocor) 10 mg at bedtime. Cholesterol panel is overdue and needs to be checked to ensure stability on current medication. - Order lipid panel  Hypocalcemia Calcium level was slightly low at 8.5 during recent hospital visit. Consumes yogurt daily, which should help with calcium intake. - Recheck calcium level  General Health Maintenance Encouraged to maintain physical activity to improve balance and prevent falls. Discussed the importance of regular physical activity, including chair exercises, to maintain strength and balance. - Encourage physical activity, including chair exercises, to improve balance and prevent  falls  Follow-up Scheduled to follow up with cardiologist in June and has a lung specialist appointment scheduled. Routine follow-up with primary care in six months to monitor ongoing health issues. - Schedule follow-up appointment in six months - Ensure follow-up with cardiologist in June - Ensure follow-up with lung specialist      Return in about 6 months (around 07/08/2024) for recheck/follow-up.    Subjective:    Chief Complaint  Patient presents with   Hospitalization Follow-up    Pt in office for hospital f/u appt; pt said it was heart palpitations and SOB; pt is ding much better; pt has been having neck and shoulder pain and completed scans and everything was clear;     HPI Discussed the use of AI scribe software for clinical note transcription with the patient, who gave verbal consent to proceed.  History of Present Illness Amy Ray is a 78 year old female with asthma who presents for an ER visit follow-up. She is accompanied by her daughter, Amy Ray.  She presents for a follow-up after an ER visit on December 24, 2023, due to shortness of breath and an asthma attack. She feels much better since the visit. She has a history of asthma and uses Symbicort and albuterol sulfate inhalers. She takes Symbicort two puffs twice daily and albuterol two puffs every six hours, which helps her lungs feel better.  During the ER visit, she experienced falls and had imaging done of her head and neck, which showed no acute processes. She has a history of cerebrovascular accident and Alzheimer's disease. She reports issues with memory, particularly short-term memory, and experiences off-balance  sensations, which she attributes to losing focus and resulting in falls. She has had physical therapy in the past but does not currently engage in it regularly. Declines need for additional PT today.  Her current medications include Zocor (simvastatin) 10 mg at bedtime for cholesterol management.  Her calcium was low during the hospital visit, and she consumes yogurt daily. She has a history of fluctuating hemoglobin levels, with a recent level of 10.7, down from 12.5 six months ago. She has not been taking iron supplements due to constipation issues and suspects a B12 deficiency due to elevated mean corpuscular volume levels. She does not currently take multivitamins, having stopped due to the size of the pills.     Past Medical History:  Diagnosis Date   Alzheimers disease (HCC)    Anticoagulant long-term use    plavix--- manged by pcp   Asthma    Chronic constipation    Diverticulosis of colon    Generalized abdominal pain    Glaucoma, both eyes    Hemorrhoids    History of CVA with residual deficit 03/16/2005   left facial paresthesia;  per pt note from stroke but in neurologist note is cva residual   History of diverticulitis of colon 05/20/2015   History of lower GI bleeding    Hyperlipidemia, mixed    Hypertension    per previous had nuclear stress test w/ cardiologist w/ dr Juliann Pares , results in care everywhere , NUC 02-01-2017  normal , ef 60%;  echo 02-02-2017 ef 50% mild AR/TR; and normal stress echo   MCI (mild cognitive impairment)    neurology--- Marlowe Kays PA ([facility]);  per note seconardy to vascular and alzeimer disease;  per MRI moderate to extensive CSV ischemia   Nocturia    OA (osteoarthritis)    PMB (postmenopausal bleeding)    Thickened endometrium    Trigeminal neuralgia of left side of face    per pt started after stroke   Vertigo    Wears partial dentures    upper    Past Surgical History:  Procedure Laterality Date   CATARACT EXTRACTION W/ INTRAOCULAR LENS IMPLANT Bilateral    right 2023;  left , yrs ago   CHOLECYSTECTOMY     CHOLECYSTECTOMY, LAPAROSCOPIC  2000   HYSTEROSCOPY WITH D & C N/A 12/20/2022   Procedure: DILATATION AND CURETTAGE;  Surgeon: Lyn Henri, MD;  Location: [facility];  Service: Gynecology;   Laterality: N/A;   REVERSE SHOULDER ARTHROPLASTY Bilateral    right 06-25-2022 in [place];   approx.  2017  left side in [place]   TOTAL KNEE ARTHROPLASTY Bilateral    2004;  2005   UMBILICAL HERNIA REPAIR     child    Family History  Problem Relation Age of Onset   Stroke Mother    Obesity Mother    Hypertension Mother    Alzheimer's disease Father    Diabetes Father    Hypertension Sister    Hypertension Sister    Hypertension Sister    Hypertension Sister    Hypertension Sister    Breast cancer Sister 70   Diabetes Brother    Breast cancer Maternal Aunt 50   Breast cancer Maternal Aunt     Social History   Tobacco Use   Smoking status: Never   Smokeless tobacco: Never  Vaping Use   Vaping status: Never Used  Substance Use Topics   Alcohol use: No    Alcohol/week: 0.0 standard drinks of  alcohol   Drug use: Never     Allergies  Allergen Reactions   Augmentin [Amoxicillin-Pot Clavulanate] Hives   Quinapril-Hydrochlorothiazide Other (See Comments) and Cough    Other reaction(s): Headache     Tramadol     Dizzy, headache, nosebleed, near-syncope   Silicone Rash    Review of Systems NEGATIVE UNLESS OTHERWISE INDICATED IN HPI      Objective:     BP 128/78 (BP Location: Left Arm, Patient Position: Sitting, Cuff Size: Large)   Pulse 82   Temp (!) 97.3 F (36.3 C) (Temporal)   Ht 5' (1.524 m)   Wt 202 lb 9.6 oz (91.9 kg)   SpO2 95%   BMI 39.57 kg/m   Wt Readings from Last 3 Encounters:  01/06/24 202 lb 9.6 oz (91.9 kg)  12/24/23 198 lb (89.8 kg)  11/09/23 199 lb (90.3 kg)    BP Readings from Last 3 Encounters:  01/06/24 128/78  12/24/23 120/72  11/09/23 (!) 140/87     Physical Exam Vitals and nursing note reviewed.  Constitutional:      Appearance: Normal appearance. She is obese.     Comments: Rollator walker   HENT:     Nose: Nose normal.     Mouth/Throat:     Mouth: Mucous membranes are moist.  Eyes:     Extraocular Movements:  Extraocular movements intact.     Conjunctiva/sclera: Conjunctivae normal.     Pupils: Pupils are equal, round, and reactive to light.  Cardiovascular:     Rate and Rhythm: Normal rate and regular rhythm.     Pulses: Normal pulses.     Heart sounds: No murmur heard. Pulmonary:     Effort: Pulmonary effort is normal.     Breath sounds: Normal breath sounds. No wheezing or rhonchi.  Musculoskeletal:     Right lower leg: No edema.     Left lower leg: No edema.  Skin:    Findings: No rash.  Neurological:     Mental Status: She is alert and oriented to person, place, and time. Mental status is at baseline.  Psychiatric:        Mood and Affect: Mood normal.        Behavior: Behavior normal.             Toyna Erisman M Ronold Hardgrove, PA-C

## 2024-01-09 ENCOUNTER — Encounter: Payer: Self-pay | Admitting: Physician Assistant

## 2024-01-13 ENCOUNTER — Other Ambulatory Visit: Payer: Self-pay | Admitting: Physician Assistant

## 2024-01-13 DIAGNOSIS — Z8673 Personal history of transient ischemic attack (TIA), and cerebral infarction without residual deficits: Secondary | ICD-10-CM

## 2024-01-13 NOTE — Telephone Encounter (Signed)
 LOV:01/06/2024 LAST REFILL:02/08/2023 QUAT:180CAPS NEXT OFFICE VISIT:11/12/2024

## 2024-01-17 ENCOUNTER — Telehealth: Payer: Self-pay | Admitting: Physician Assistant

## 2024-01-17 NOTE — Telephone Encounter (Unsigned)
 Copied from CRM [id number]. Topic: Referral - Status >> Jan 17, 2024  2:04 PM Caliyah H wrote: Reason for CRM: Pt daughter is calling to request referral status. Please contact patient at [phone].

## 2024-01-18 ENCOUNTER — Encounter: Payer: Self-pay | Admitting: Pulmonary Disease

## 2024-01-18 ENCOUNTER — Ambulatory Visit: Payer: Medicare HMO | Admitting: Pulmonary Disease

## 2024-01-18 VITALS — BP 150/100 | HR 89 | Temp 97.8°F | Ht 60.0 in | Wt 202.0 lb

## 2024-01-18 DIAGNOSIS — Z8673 Personal history of transient ischemic attack (TIA), and cerebral infarction without residual deficits: Secondary | ICD-10-CM | POA: Diagnosis not present

## 2024-01-18 DIAGNOSIS — J4540 Moderate persistent asthma, uncomplicated: Secondary | ICD-10-CM | POA: Diagnosis not present

## 2024-01-18 LAB — NITRIC OXIDE: Nitric Oxide: 8

## 2024-01-18 NOTE — Patient Instructions (Addendum)
 VISIT SUMMARY:  Today, you had a follow-up appointment to discuss your moderate persistent asthma, episodes of inaudible pulse, and leg swelling. Your asthma is well-controlled with your current inhaler regimen, and you are following the dosing schedule correctly. We also discussed your concerns about hearing your pulse and the swelling in your legs.  YOUR PLAN:  -MODERATE PERSISTENT ASTHMA: Moderate persistent asthma is a condition where the airways in your lungs are inflamed and narrowed, causing difficulty in breathing. Your asthma is well-controlled with your maintenance inhaler taken twice daily and your emergency inhaler used as needed. You should continue with this regimen and avoid grapefruit juice as it can interfere with your medication. An airway inflammation test showed there was no inflammation in your airway which is good news.  -PALPITATIONS: Palpitations are feelings that your heart is beating too hard or too fast, skipping a beat, or fluttering. You experience episodes where you cannot hear your pulse, which may be due to an altered heart rhythm. You should continue your follow-up with your cardiologist to monitor this condition.  -PERIPHERAL EDEMA: Peripheral edema is swelling in the lower legs or hands due to fluid buildup. You experience leg swelling when you do not elevate your legs, which may be related to a cardiac condition. It is advised to elevate your legs to help reduce the swelling.  INSTRUCTIONS:  Please continue with your current inhaler regimen and avoid grapefruit juice at the same time as medications. Follow up with your cardiologist  for your palpitations. Elevate your legs to reduce swelling. An airway inflammation test showed there was no inflammation in your lungs.

## 2024-01-18 NOTE — Progress Notes (Signed)
 Subjective:    Patient ID: Amy Ray, female    DOB: 22-Sep-1946, 78 y.o.   MRN: [id number]  Patient Care Team: Allwardt, Crist Infante, PA-C as PCP - General (Physician Assistant) Jake Bathe, MD as PCP - Cardiology (Cardiology) Isla Pence, OD as Consulting Physician (Optometry) Deeann Saint, MD (Orthopedic Surgery) Alwyn Pea, MD as Consulting Physician (Cardiology) Pa, [facility] (Ophthalmology)  Chief Complaint  Patient presents with   Follow-up    Occasional cough and wheezing.     BACKGROUND/INTERVAL: This is a 78 year old lifelong never smoker with a history as noted below, who presents for follow-up of shortness of breath present since February 2024, worse with activity and improved with albuterol.  Was initially seen here on 03 August 2023 for the details of that visit please refer to that note.  Patient was treated with Trelegy Ellipta for presumed asthma.  Trelegy Ellipta had to be switched to Symbicort due to the patient not tolerating taste of the medication.  Patient was last seen here on 20 September 2023 and was doing well at that time.  Continues to do well with Symbicort  HPI Discussed the use of AI scribe software for clinical note transcription with the patient, who gave verbal consent to proceed.  History of Present Illness   Amy Ray is a 78 year old female with moderate persistent asthma who presents for follow-up.  She presents with her daughter.  She is managing her moderate persistent asthma with her inhaler regimen, taking her maintenance inhaler twice daily, approximately every twelve hours, and using her emergency inhaler as needed. She experienced nocturnal wheezing, waking up at 2 AM, relieved by her emergency inhaler.  This occurred once last week.  She attributes this to being exposed to allergens due to significant pollen in the atmosphere.  Her asthma is otherwise, well-controlled with her current medication regimen.  She  experiences episodes where she feels her pulse being more intense, which causes concern. These episodes disrupt her sleep, as she wakes up at night and cannot return to sleep. No leg swelling is noted unless she does not elevate her legs.  She has not had any orthopnea.  No paroxysmal nocturnal dyspnea.  No fevers, chills or sweats.  Rare dry cough which responds to albuterol.     Review of Systems A 10 point review of systems was performed and it is as noted above otherwise negative.   Patient Active Problem List   Diagnosis Date Noted   Mild intermittent asthma without complication 01/06/2024   Chronic pain of both knees 01/21/2023   Bilateral lower extremity edema 01/21/2023   History of reverse total replacement of right shoulder joint 01/11/2023   Osteoarthritis of right shoulder 01/11/2023   Glaucoma 01/11/2023   Gastroesophageal reflux disease 01/11/2023   History of shoulder surgery 01/11/2023   Facial neuralgia 01/11/2023   Pain in right shoulder 07/16/2022   Mild cognitive impairment 02/08/2022   Mild late onset Alzheimer's dementia (HCC) 11/11/2021   Vitamin D deficiency 09/21/2020   Hypokalemia 08/05/2020   History of total bilateral knee replacement 06/20/2018   Presence of unspecified artificial shoulder joint 06/20/2018   Low back pain 05/20/2015   Fatigue 05/20/2015   History of CVA (cerebrovascular accident) 05/20/2015   Insomnia 05/20/2015   Morbid obesity (HCC) 05/20/2015   Positive H. pylori test 05/20/2015   Tremor 05/20/2015   Dysfunction of eustachian tube 05/20/2015   Diverticulosis 05/20/2015   Essential (primary) hypertension 07/17/2007   Alterations  of sensations, late effect of cerebrovascular disease 03/11/2005   Hyperlipidemia 10/12/2003   Trigeminal neuralgia 10/12/2003    Social History   Tobacco Use   Smoking status: Never   Smokeless tobacco: Never  Substance Use Topics   Alcohol use: No    Alcohol/week: 0.0 standard drinks of alcohol     Allergies  Allergen Reactions   Augmentin [Amoxicillin-Pot Clavulanate] Hives   Quinapril-Hydrochlorothiazide Other (See Comments) and Cough    Other reaction(s): Headache     Tramadol     Dizzy, headache, nosebleed, near-syncope   Silicone Rash    Current Meds  Medication Sig   Acetaminophen (TYLENOL EX ST ARTHRITIS PAIN PO) Take 2 tablets by mouth daily as needed (for arthritis pain).   albuterol (VENTOLIN HFA) 108 (90 Base) MCG/ACT inhaler Inhale 1-2 puffs into the lungs every 6 (six) hours as needed for wheezing or shortness of breath.   aspirin 81 MG tablet Take 1 tablet by mouth daily.   budesonide-formoterol (SYMBICORT) 160-4.5 MCG/ACT inhaler Inhale 2 puffs into the lungs 2 (two) times daily.   carbamazepine (TEGRETOL) 200 MG tablet TAKE 1 TABLET TWICE DAILY   clopidogrel (PLAVIX) 75 MG tablet TAKE 1 TABLET EVERY DAY   diltiazem (DILACOR XR) 180 MG 24 hr capsule TAKE 2 CAPSULES EVERY DAY   famotidine (PEPCID) 40 MG tablet TAKE 1 TABLET AT BEDTIME   furosemide (LASIX) 20 MG tablet TAKE 1 TABLET EVERY DAY   gabapentin (NEURONTIN) 100 MG capsule Take 1 capsule (100 mg total) by mouth 2 (two) times daily as needed.   HYDROcodone-acetaminophen (NORCO/VICODIN) 5-325 MG tablet Take 1 tablet by mouth every 6 (six) hours as needed for moderate pain (pain score 4-6).   lidocaine (XYLOCAINE) 2 % solution    nortriptyline (PAMELOR) 10 MG capsule TAKE 1 TO 2 CAPSULES BY MOUTH AT BEDTIME   SIMBRINZA 1-0.2 % SUSP Place 1 drop into both eyes 2 (two) times daily.   simvastatin (ZOCOR) 10 MG tablet TAKE 1 TABLET AT BEDTIME   triamcinolone (KENALOG) 0.1 % paste 1 Application.   VITAMIN D PO Take 1 capsule by mouth daily.    Immunization History  Administered Date(s) Administered   Fluad Quad(high Dose 65+) 06/27/2020, 07/17/2021, 06/18/2022, 06/13/2023   Influenza, High Dose Seasonal PF 07/12/2016, 06/16/2017, 06/21/2018, 06/22/2019, 07/14/2022, 06/13/2023   Influenza-Unspecified  05/12/2015   Moderna Covid-19 Vaccine Bivalent Booster 16yrs & up 05/11/2021   Moderna SARS-COV2 Booster Vaccination 08/07/2020, 05/11/2021   Moderna Sars-Covid-2 Vaccination 11/24/2019, 12/24/2019, 08/07/2020   Pfizer(Comirnaty)Fall Seasonal Vaccine 12 years and older 07/14/2022, 06/13/2023   Pneumococcal Conjugate-13 12/05/2014   Pneumococcal Polysaccharide-23 05/09/2013   Rsv, Bivalent, Protein Subunit Rsvpref,pf Verdis Frederickson) 07/25/2023   Tdap 08/12/2011, 07/25/2023   Zoster Recombinant(Shingrix) 07/20/2021, 09/25/2021        Objective:     BP (!) 150/100 (BP Location: Left Arm, Patient Position: Sitting, Cuff Size: Normal)   Pulse 89   Temp 97.8 F (36.6 C) (Temporal)   Ht 5' (1.524 m)   Wt 202 lb (91.6 kg)   SpO2 98%   BMI 39.45 kg/m   SpO2: 98 %  GENERAL: Obese woman, no acute distress, ambulatory with assistance of a walker.  No conversational dyspnea. HEAD: Normocephalic, atraumatic.  EYES: Pupils equal, round, reactive to light.  No scleral icterus.  MOUTH: Poor dentition, missing upper teeth.  Oral mucosa moist moist.  No thrush. NECK: Supple. No thyromegaly. Trachea midline. No JVD.  No adenopathy. PULMONARY: Good air entry bilaterally.  No wheezes noted,  coarse, no rhonchi. CARDIOVASCULAR: S1 and S2. Regular rate and rhythm.  No rubs, murmurs or gallops heard. ABDOMEN: Obese abdomen, otherwise benign. MUSCULOSKELETAL: No joint deformity, no clubbing, no edema.  NEUROLOGIC: No overt focal deficit, gait assisted by walker, speech is fluent. SKIN: Intact,warm,dry. PSYCH: Mood and behavior normal.   Lab Results  Component Value Date   NITRICOXIDE 8 01/18/2024  *No evidence of type II inflammation       Assessment & Plan:     ICD-10-CM   1. Moderate persistent asthma without complication  J45.40 Nitric oxide    2. History of CVA (cerebrovascular accident)  Z72.73       Orders Placed This Encounter  Procedures   Nitric oxide   Discussion:     Moderate persistent asthma Asthma is well-controlled with maintenance inhaler twice daily and emergency inhaler as needed. She experienced nocturnal wheezing relieved by the emergency inhaler. Airway inflammation test showed no inflammation, indicating good control. She adheres to the 12-hour dosing schedule and understands the importance of medication adherence and avoiding grapefruit juice due to absorption interference. - Continue maintenance inhaler twice daily and emergency inhaler as needed. - Reinforce education on medication adherence. - Nitric oxide level did not show evidence of airway inflammation.  Palpitations/Hypertension Experiences episodes of pounding pulse, described as bothersome. Concerned about heart rhythm but does not perceive skipped beats.  May be related to poorly controlled hypertension.  Follow-up with cardiology or primary care for further workup. - Continue follow-up with cardiology/primary care.  Peripheral edema Reports leg swelling when not elevated, suggesting peripheral edema possibly related to venous insufficiency. - Advise leg elevation to reduce swelling.  - Follow-up with primary care.     Advised if symptoms do not improve or worsen, to please contact office for sooner follow up or seek emergency care.    I spent 32 minutes of dedicated to the care of this patient on the date of this encounter to include pre-visit review of records, face-to-face time with the patient discussing conditions above, post visit ordering of testing, clinical documentation with the electronic health record, making appropriate referrals as documented, and communicating necessary findings to members of the patients care team.    C. Danice Goltz, MD Advanced Bronchoscopy P[facility]    *This note was generated using voice recognition software/Dragon and/or AI transcription program.  Despite best efforts to proofread, errors can occur which can  change the meaning. Any transcriptional errors that result from this process are unintentional and may not be fully corrected at the time of dictation.

## 2024-03-30 ENCOUNTER — Encounter (HOSPITAL_BASED_OUTPATIENT_CLINIC_OR_DEPARTMENT_OTHER): Payer: Self-pay | Admitting: Emergency Medicine

## 2024-03-30 ENCOUNTER — Emergency Department (HOSPITAL_BASED_OUTPATIENT_CLINIC_OR_DEPARTMENT_OTHER)
Admission: EM | Admit: 2024-03-30 | Discharge: 2024-03-30 | Disposition: A | Discharge status: Home / Self Care | Attending: Emergency Medicine | Admitting: Emergency Medicine

## 2024-03-30 ENCOUNTER — Emergency Department (HOSPITAL_BASED_OUTPATIENT_CLINIC_OR_DEPARTMENT_OTHER): Admitting: Radiology

## 2024-03-30 ENCOUNTER — Other Ambulatory Visit: Payer: Self-pay

## 2024-03-30 DIAGNOSIS — Z7902 Long term (current) use of antithrombotics/antiplatelets: Secondary | ICD-10-CM | POA: Diagnosis not present

## 2024-03-30 DIAGNOSIS — I10 Essential (primary) hypertension: Secondary | ICD-10-CM

## 2024-03-30 DIAGNOSIS — Z7982 Long term (current) use of aspirin: Secondary | ICD-10-CM | POA: Diagnosis not present

## 2024-03-30 DIAGNOSIS — Z79899 Other long term (current) drug therapy: Secondary | ICD-10-CM | POA: Diagnosis not present

## 2024-03-30 DIAGNOSIS — Z8673 Personal history of transient ischemic attack (TIA), and cerebral infarction without residual deficits: Secondary | ICD-10-CM | POA: Diagnosis not present

## 2024-03-30 DIAGNOSIS — I700 Atherosclerosis of aorta: Secondary | ICD-10-CM | POA: Diagnosis not present

## 2024-03-30 DIAGNOSIS — I771 Stricture of artery: Secondary | ICD-10-CM | POA: Diagnosis not present

## 2024-03-30 DIAGNOSIS — Z96612 Presence of left artificial shoulder joint: Secondary | ICD-10-CM | POA: Diagnosis not present

## 2024-03-30 DIAGNOSIS — Z96611 Presence of right artificial shoulder joint: Secondary | ICD-10-CM | POA: Diagnosis not present

## 2024-03-30 LAB — CBC
HCT: 34.2 % — ABNORMAL LOW (ref 36.0–46.0)
Hemoglobin: 11.5 g/dL — ABNORMAL LOW (ref 12.0–15.0)
MCH: 33.6 pg (ref 26.0–34.0)
MCHC: 33.6 g/dL (ref 30.0–36.0)
MCV: 100 fL (ref 80.0–100.0)
Platelets: 166 10*3/uL (ref 150–400)
RBC: 3.42 MIL/uL — ABNORMAL LOW (ref 3.87–5.11)
RDW: 12.1 % (ref 11.5–15.5)
WBC: 4.6 10*3/uL (ref 4.0–10.5)
nRBC: 0 % (ref 0.0–0.2)

## 2024-03-30 LAB — BASIC METABOLIC PANEL WITH GFR
Anion gap: 11 (ref 5–15)
BUN: 14 mg/dL (ref 8–23)
CO2: 25 mmol/L (ref 22–32)
Calcium: 9.3 mg/dL (ref 8.9–10.3)
Chloride: 104 mmol/L (ref 98–111)
Creatinine, Ser: 0.88 mg/dL (ref 0.44–1.00)
GFR, Estimated: >60 mL/min (ref >60)
Glucose, Bld: 86 mg/dL (ref 70–99)
Potassium: 3.6 mmol/L (ref 3.5–5.1)
Sodium: 141 mmol/L (ref 135–145)

## 2024-03-30 NOTE — Discharge Instructions (Signed)
 Please keep a daily diary of your blood pressure at home, checking twice a day in the morning and at night and writing down the numbers for your doctor's office.

## 2024-03-30 NOTE — ED Triage Notes (Signed)
 Pt caox4 c/o HTN since last night reporting that she took her BP at home and the systolic was over 200. Pt reports she had choked on water causing her to cough a lot and had pain in her neck after.

## 2024-03-30 NOTE — ED Notes (Signed)
 DC paperwork given and verbally understood.

## 2024-03-30 NOTE — ED Provider Notes (Signed)
 [place] EMERGENCY DEPARTMENT AT [facility] Provider Note   CSN: [id number] Arrival date & time: 03/30/24  7829     Patient presents with: Hypertension   Amy Ray is a 78 y.o. female with history of hypertension, stroke, presenting to the ED with complaint of choking spell and high blood pressure yesterday.  Patient reports that she choked on some water yesterday and had a pretty vigorous choking spell, and afterwards she was checking her blood pressure and the systolic numbers over 200.  She says she was coughing pretty heavily.  She reports that this morning her blood pressure still seemed higher than normal, typically her systolic pressures of 140.  She does take diltiazem  2 capsules daily and has been compliant with this, no other blood pressure medication.  She does not report chest pain or pressure.  She reports a very mild headache.  She denies numbness or weakness of the arms or legs   HPI     Prior to Admission medications   Medication Sig Start Date End Date Taking? Authorizing Provider  Acetaminophen  (TYLENOL  EX ST ARTHRITIS PAIN PO) Take 2 tablets by mouth daily as needed (for arthritis pain).    [provider]  albuterol  (VENTOLIN  HFA) 108 (90 Base) MCG/ACT inhaler Inhale 1-2 puffs into the lungs every 6 (six) hours as needed for wheezing or shortness of breath. 09/20/23   Marc Senior, MD  aspirin  81 MG tablet Take 1 tablet by mouth daily.    [provider]  budesonide -formoterol  (SYMBICORT ) 160-4.5 MCG/ACT inhaler Inhale 2 puffs into the lungs 2 (two) times daily. 08/26/23   Marc Senior, MD  carbamazepine  (TEGRETOL ) 200 MG tablet TAKE 1 TABLET TWICE DAILY 09/12/23   Allwardt, Alyssa M, PA-C  clopidogrel  (PLAVIX ) 75 MG tablet TAKE 1 TABLET EVERY DAY 01/13/24   Allwardt, Alyssa M, PA-C  diltiazem  (DILACOR XR ) 180 MG 24 hr capsule TAKE 2 CAPSULES EVERY DAY 08/29/23   Allwardt, Alyssa M, PA-C  famotidine  (PEPCID ) 40 MG tablet TAKE 1  TABLET AT BEDTIME 01/13/24   Allwardt, Alyssa M, PA-C  furosemide  (LASIX ) 20 MG tablet TAKE 1 TABLET EVERY DAY 11/10/23   Allwardt, Alyssa M, PA-C  gabapentin  (NEURONTIN ) 100 MG capsule Take 1 capsule (100 mg total) by mouth 2 (two) times daily as needed. 08/05/23   Magnant, Charles L, PA-C  HYDROcodone -acetaminophen  (NORCO/VICODIN) 5-325 MG tablet Take 1 tablet by mouth every 6 (six) hours as needed for moderate pain (pain score 4-6). 08/16/23   Steinl, Kevin, MD  lidocaine  (XYLOCAINE ) 2 % solution  12/20/23   [provider]  nortriptyline  (PAMELOR ) 10 MG capsule TAKE 1 TO 2 CAPSULES BY MOUTH AT BEDTIME 01/15/24   Allwardt, Alyssa M, PA-C  SIMBRINZA 1-0.2 % SUSP Place 1 drop into both eyes 2 (two) times daily. 05/27/22   [provider]  simvastatin  (ZOCOR ) 10 MG tablet TAKE 1 TABLET AT BEDTIME 11/10/23   Allwardt, Alyssa M, PA-C  triamcinolone  (KENALOG ) 0.1 % paste 1 Application. 12/20/23   [provider]  VITAMIN D PO Take 1 capsule by mouth daily.    [provider]    Allergies: Augmentin  [amoxicillin -pot clavulanate], Quinapril-hydrochlorothiazide , Tramadol, and Silicone    Review of Systems  Updated Vital Signs BP (!) 142/65   Pulse 79   Temp 98.7 F (37.1 C) (Oral)   Resp 18   SpO2 100%   Physical Exam Constitutional:      General: She is not in acute distress. HENT:  Head: Normocephalic and atraumatic.   Eyes:     Conjunctiva/sclera: Conjunctivae normal.     Pupils: Pupils are equal, round, and reactive to light.    Cardiovascular:     Rate and Rhythm: Normal rate and regular rhythm.  Pulmonary:     Effort: Pulmonary effort is normal. No respiratory distress.  Abdominal:     General: There is no distension.     Tenderness: There is no abdominal tenderness.   Skin:    General: Skin is warm and dry.   Neurological:     General: No focal deficit present.     Mental Status: She is alert and oriented to person, place, and time. Mental  status is at baseline.     Cranial Nerves: No cranial nerve deficit.     Sensory: No sensory deficit.     Motor: No weakness.   Psychiatric:        Mood and Affect: Mood normal.        Behavior: Behavior normal.     (all labs ordered are listed, but only abnormal results are displayed) Labs Reviewed  CBC - Abnormal; Notable for the following components:      Result Value   RBC 3.42 (*)    Hemoglobin 11.5 (*)    HCT 34.2 (*)    All other components within normal limits  BASIC METABOLIC PANEL WITH GFR    EKG: None  Radiology: DG Chest 2 View Result Date: 03/30/2024 CLINICAL DATA:  aspiration evaluation EXAM: CHEST - 2 VIEW COMPARISON:  December 24, 2023 FINDINGS: No focal airspace consolidation, pleural effusion, or pneumothorax. No cardiomegaly. Tortuous aorta with aortic atherosclerosis. No acute fracture or destructive lesion. Cholecystectomy clips. Bilateral shoulder arthroplasties. Multilevel degenerative disc disease of the spine. IMPRESSION: No acute cardiopulmonary abnormality. Electronically Signed   By: Rance Burrows M.D.   On: 03/30/2024 09:31     Procedures   Medications Ordered in the ED - No data to display                                  Medical Decision Making Amount and/or Complexity of Data Reviewed Labs: ordered. Radiology: ordered.   Patient is here with intermittent hypertension after a choking spell yesterday.  She is not hypoxic and clinically well-appearing.  Low suspicion for stroke.  I think an x-ray be reasonable to evaluate for aspiration as well as checking basic labs.  However her blood pressure appears to be under fairly well-controlled here in the ED, she is otherwise minimally symptomatic.  She is here with her daughter at the bedside.  I personally reviewed and interpreted the patient's labs and imaging, notable for no emergent findings  I do not think the patient is requiring any further workup at this time.  I recommended follow-up  with the PCP.  She can monitor her blood pressure at home.  No indication for neuroimaging at this time. Low suspicion for stroke, sepsis, TIA, CVA, ACS, PE, or other life threatening emergency.  Patient's blood pressure has been normal during her stay in the ED and she is otherwise asymptomatic.        Final diagnoses:  Hypertension, unspecified type    ED Discharge Orders     None          Arvilla Birmingham, MD 03/30/24 [phone]

## 2024-04-09 ENCOUNTER — Ambulatory Visit: Admitting: Cardiology

## 2024-04-23 DIAGNOSIS — H4062X0 Glaucoma secondary to drugs, left eye, stage unspecified: Secondary | ICD-10-CM | POA: Diagnosis not present

## 2024-04-23 DIAGNOSIS — H401111 Primary open-angle glaucoma, right eye, mild stage: Secondary | ICD-10-CM | POA: Diagnosis not present

## 2024-04-23 DIAGNOSIS — H532 Diplopia: Secondary | ICD-10-CM | POA: Diagnosis not present

## 2024-04-30 ENCOUNTER — Ambulatory Visit (INDEPENDENT_AMBULATORY_CARE_PROVIDER_SITE_OTHER)

## 2024-04-30 ENCOUNTER — Ambulatory Visit: Admitting: Podiatry

## 2024-04-30 ENCOUNTER — Encounter: Payer: Self-pay | Admitting: Podiatry

## 2024-04-30 ENCOUNTER — Telehealth: Payer: Self-pay

## 2024-04-30 VITALS — Ht 60.0 in | Wt 202.0 lb

## 2024-04-30 DIAGNOSIS — M779 Enthesopathy, unspecified: Secondary | ICD-10-CM

## 2024-04-30 DIAGNOSIS — M722 Plantar fascial fibromatosis: Secondary | ICD-10-CM

## 2024-04-30 DIAGNOSIS — M21611 Bunion of right foot: Secondary | ICD-10-CM

## 2024-04-30 NOTE — Telephone Encounter (Signed)
 Called patient to get more information regarding referral. Pt stated this was taken care of today and she doesn't need anything from us . No further questions at this time.

## 2024-04-30 NOTE — Telephone Encounter (Signed)
 Noted and agreed, thank you.

## 2024-04-30 NOTE — Telephone Encounter (Signed)
 Copied from CRM [id number]. Topic: Referral - Question >> Apr 30, 2024  9:44 AM Vena H wrote: Reason for CRM: Pt called in wanting to know when she will be sent to the podiatrist, states she came in for a referral a few months ago and has not heard back >> Apr 30, 2024  9:55 AM Lavanda D wrote: Patient is calling to check the status of the referral, and then would like the phone number of where it is sent as well.   Please see patient messages. When looking at last two office notes not seeing anything was discussed regarding referral to podiatry. Please advise if ok to send referral for patient.

## 2024-04-30 NOTE — Progress Notes (Signed)
 Subjective:   Patient ID: Amy Ray, female   DOB: 78 y.o.   MRN: [id number]   HPI Patient presents with several different problems with 1 being bunion deformity discomfort another being pain in the arch of the right foot and pain somewhat in the dorsal foot.  Presents with caregiver.  Patient does not smoke likes to be active   Review of Systems  All other systems reviewed and are negative.       Objective:  Physical Exam Vitals and nursing note reviewed.  Constitutional:      Appearance: She is well-developed.  Pulmonary:     Effort: Pulmonary effort is normal.  Musculoskeletal:        General: Normal range of motion.  Skin:    General: Skin is warm.  Neurological:     Mental Status: She is alert.     Neurovascular status intact muscle strength found to be adequate range of motion adequate large hyperostosis medial aspect first metatarsal head right foot pressure against the second toe with inflammation of a moderate nature in the arch and dorsum of the right foot with flatfoot deformity.  Found to have good digital perfusion well-oriented x 3     Assessment:  Appears to be an inflammatory condition but the patient also has structural malalignment     Plan:  H&P x-ray reviewed conditions discussed.  We discussed shoe gear modifications which seems to do well for her and I have recommended she continue along this course with shoe gear modifications and at this point no aggressive treatment may be necessary in future  X-rays indicate significant bunion deformity right with elevation of the intermetatarsal angle and pressure with moderate flatfoot deformity noted

## 2024-05-10 ENCOUNTER — Encounter (HOSPITAL_BASED_OUTPATIENT_CLINIC_OR_DEPARTMENT_OTHER): Payer: Self-pay

## 2024-05-10 ENCOUNTER — Emergency Department (HOSPITAL_BASED_OUTPATIENT_CLINIC_OR_DEPARTMENT_OTHER)

## 2024-05-10 ENCOUNTER — Emergency Department (HOSPITAL_BASED_OUTPATIENT_CLINIC_OR_DEPARTMENT_OTHER)
Admission: EM | Admit: 2024-05-10 | Discharge: 2024-05-10 | Disposition: A | Discharge status: Home / Self Care | Attending: Emergency Medicine | Admitting: Emergency Medicine

## 2024-05-10 ENCOUNTER — Other Ambulatory Visit (HOSPITAL_BASED_OUTPATIENT_CLINIC_OR_DEPARTMENT_OTHER): Payer: Self-pay

## 2024-05-10 ENCOUNTER — Other Ambulatory Visit: Payer: Self-pay

## 2024-05-10 DIAGNOSIS — R0602 Shortness of breath: Secondary | ICD-10-CM | POA: Diagnosis not present

## 2024-05-10 DIAGNOSIS — Z7982 Long term (current) use of aspirin: Secondary | ICD-10-CM | POA: Insufficient documentation

## 2024-05-10 DIAGNOSIS — I771 Stricture of artery: Secondary | ICD-10-CM | POA: Diagnosis not present

## 2024-05-10 DIAGNOSIS — I700 Atherosclerosis of aorta: Secondary | ICD-10-CM | POA: Diagnosis not present

## 2024-05-10 DIAGNOSIS — Z79899 Other long term (current) drug therapy: Secondary | ICD-10-CM | POA: Diagnosis not present

## 2024-05-10 DIAGNOSIS — Z7951 Long term (current) use of inhaled steroids: Secondary | ICD-10-CM | POA: Diagnosis not present

## 2024-05-10 DIAGNOSIS — Z7902 Long term (current) use of antithrombotics/antiplatelets: Secondary | ICD-10-CM | POA: Insufficient documentation

## 2024-05-10 DIAGNOSIS — J45901 Unspecified asthma with (acute) exacerbation: Secondary | ICD-10-CM | POA: Diagnosis not present

## 2024-05-10 DIAGNOSIS — I10 Essential (primary) hypertension: Secondary | ICD-10-CM | POA: Insufficient documentation

## 2024-05-10 DIAGNOSIS — Z7952 Long term (current) use of systemic steroids: Secondary | ICD-10-CM | POA: Diagnosis not present

## 2024-05-10 DIAGNOSIS — J4521 Mild intermittent asthma with (acute) exacerbation: Secondary | ICD-10-CM | POA: Diagnosis not present

## 2024-05-10 LAB — RESP PANEL BY RT-PCR (RSV, FLU A&B, COVID)  RVPGX2
Influenza A by PCR: NEGATIVE
Influenza B by PCR: NEGATIVE
Resp Syncytial Virus by PCR: NEGATIVE
SARS Coronavirus 2 by RT PCR: NEGATIVE

## 2024-05-10 LAB — CBC WITH DIFFERENTIAL/PLATELET
Abs Immature Granulocytes: 0.01 K/uL (ref 0.00–0.07)
Basophils Absolute: 0 K/uL (ref 0.0–0.1)
Basophils Relative: 1 %
Eosinophils Absolute: 0.1 K/uL (ref 0.0–0.5)
Eosinophils Relative: 2 %
HCT: 35.5 % — ABNORMAL LOW (ref 36.0–46.0)
Hemoglobin: 12.1 g/dL (ref 12.0–15.0)
Immature Granulocytes: 0 %
Lymphocytes Relative: 31 %
Lymphs Abs: 1.7 K/uL (ref 0.7–4.0)
MCH: 34.1 pg — ABNORMAL HIGH (ref 26.0–34.0)
MCHC: 34.1 g/dL (ref 30.0–36.0)
MCV: 100 fL (ref 80.0–100.0)
Monocytes Absolute: 0.6 K/uL (ref 0.1–1.0)
Monocytes Relative: 12 %
Neutro Abs: 3.1 K/uL (ref 1.7–7.7)
Neutrophils Relative %: 54 %
Platelets: 172 K/uL (ref 150–400)
RBC: 3.55 MIL/uL — ABNORMAL LOW (ref 3.87–5.11)
RDW: 12.1 % (ref 11.5–15.5)
WBC: 5.6 K/uL (ref 4.0–10.5)
nRBC: 0 % (ref 0.0–0.2)

## 2024-05-10 LAB — BASIC METABOLIC PANEL WITH GFR
Anion gap: 14 (ref 5–15)
BUN: 22 mg/dL (ref 8–23)
CO2: 21 mmol/L — ABNORMAL LOW (ref 22–32)
Calcium: 9.4 mg/dL (ref 8.9–10.3)
Chloride: 105 mmol/L (ref 98–111)
Creatinine, Ser: 0.98 mg/dL (ref 0.44–1.00)
GFR, Estimated: 59 mL/min — ABNORMAL LOW (ref >60)
Glucose, Bld: 110 mg/dL — ABNORMAL HIGH (ref 70–99)
Potassium: 3.7 mmol/L (ref 3.5–5.1)
Sodium: 140 mmol/L (ref 135–145)

## 2024-05-10 LAB — PRO BRAIN NATRIURETIC PEPTIDE: Pro Brain Natriuretic Peptide: 100 pg/mL (ref <300.0)

## 2024-05-10 LAB — TROPONIN T, HIGH SENSITIVITY: Troponin T High Sensitivity: <15 ng/L (ref <19)

## 2024-05-10 MED ORDER — PREDNISONE 20 MG PO TABS
40.0000 mg | ORAL_TABLET | Freq: Every day | ORAL | 0 refills | Status: AC
  Filled 2024-05-10: qty 8, 4d supply, fill #0

## 2024-05-10 MED ORDER — METHYLPREDNISOLONE SODIUM SUCC 125 MG IJ SOLR
125.0000 mg | Freq: Once | INTRAMUSCULAR | Status: AC
  Administered 2024-05-10: 125 mg via INTRAVENOUS
  Filled 2024-05-10: qty 2

## 2024-05-10 MED ORDER — ALBUTEROL SULFATE HFA 108 (90 BASE) MCG/ACT IN AERS
2.0000 | INHALATION_SPRAY | Freq: Once | RESPIRATORY_TRACT | Status: AC
  Administered 2024-05-10: 2 via RESPIRATORY_TRACT
  Filled 2024-05-10: qty 6.7

## 2024-05-10 MED ORDER — IPRATROPIUM-ALBUTEROL 0.5-2.5 (3) MG/3ML IN SOLN
3.0000 mL | Freq: Once | RESPIRATORY_TRACT | Status: AC
  Administered 2024-05-10: 3 mL via RESPIRATORY_TRACT
  Filled 2024-05-10: qty 3

## 2024-05-10 NOTE — Discharge Instructions (Addendum)
 Take next dose of prednisone  tomorrow.  Follow-up with your primary care doctor.  Return if symptoms worsen.  Recommend using 2 to 4 puffs of albuterol  inhaler every 4-6 hours as needed.

## 2024-05-10 NOTE — ED Provider Notes (Signed)
 [place] EMERGENCY DEPARTMENT AT [facility] Provider Note   CSN: [id number] Arrival date & time: 05/10/24  9279     Patient presents with: Shortness of Breath   Amy Ray is a 78 y.o. female.   Patient here with wheezing and asthma symptoms for the last day or so.  She took her inhaler before coming which has helped.  Denies any recent surgery or travel.  No blood clot history.  No chest pain no weakness no numbness.  History of hypertension.  She denies any chest pain cough sputum production or fever or chills.  No abdominal pain.  Nothing makes it worse.  Inhaler made it better.  The history is provided by the patient.       Prior to Admission medications   Medication Sig Start Date End Date Taking? Authorizing Provider  brimonidine (ALPHAGAN) 0.2 % ophthalmic solution SMARTSIG:In Eye(s) 03/26/24  Yes [provider]  dorzolamide (TRUSOPT) 2 % ophthalmic solution SMARTSIG:In Eye(s) 03/26/24  Yes [provider]  predniSONE  (DELTASONE ) 20 MG tablet Take 2 tablets (40 mg total) by mouth daily for 4 days. 05/10/24 05/14/24 Yes Sherrin Stahle, DO  Acetaminophen  (TYLENOL  EX ST ARTHRITIS PAIN PO) Take 2 tablets by mouth daily as needed (for arthritis pain).    [provider]  albuterol  (VENTOLIN  HFA) 108 (90 Base) MCG/ACT inhaler Inhale 1-2 puffs into the lungs every 6 (six) hours as needed for wheezing or shortness of breath. 09/20/23   Tamea Dedra CROME, MD  aspirin  81 MG tablet Take 1 tablet by mouth daily.    [provider]  budesonide -formoterol  (SYMBICORT ) 160-4.5 MCG/ACT inhaler Inhale 2 puffs into the lungs 2 (two) times daily. 08/26/23   Tamea Dedra CROME, MD  carbamazepine  (TEGRETOL ) 200 MG tablet TAKE 1 TABLET TWICE DAILY 09/12/23   Allwardt, Alyssa M, PA-C  clopidogrel  (PLAVIX ) 75 MG tablet TAKE 1 TABLET EVERY DAY 01/13/24   Allwardt, Alyssa M, PA-C  diltiazem  (DILACOR XR ) 180 MG 24 hr capsule TAKE 2 CAPSULES EVERY DAY 08/29/23    Allwardt, Alyssa M, PA-C  famotidine  (PEPCID ) 40 MG tablet TAKE 1 TABLET AT BEDTIME 01/13/24   Allwardt, Alyssa M, PA-C  furosemide  (LASIX ) 20 MG tablet TAKE 1 TABLET EVERY DAY 11/10/23   Allwardt, Alyssa M, PA-C  gabapentin  (NEURONTIN ) 100 MG capsule Take 1 capsule (100 mg total) by mouth 2 (two) times daily as needed. 08/05/23   Magnant, Carlin CROME, PA-C  HYDROcodone -acetaminophen  (NORCO/VICODIN) 5-325 MG tablet Take 1 tablet by mouth every 6 (six) hours as needed for moderate pain (pain score 4-6). 08/16/23   Steinl, Kevin, MD  lidocaine  (XYLOCAINE ) 2 % solution  12/20/23   [provider]  nortriptyline  (PAMELOR ) 10 MG capsule TAKE 1 TO 2 CAPSULES BY MOUTH AT BEDTIME 01/15/24   Allwardt, Alyssa M, PA-C  SIMBRINZA 1-0.2 % SUSP Place 1 drop into both eyes 2 (two) times daily. 05/27/22   [provider]  simvastatin  (ZOCOR ) 10 MG tablet TAKE 1 TABLET AT BEDTIME 11/10/23   Allwardt, Alyssa M, PA-C  triamcinolone  (KENALOG ) 0.1 % paste 1 Application. 12/20/23   [provider]  VITAMIN D PO Take 1 capsule by mouth daily.    [provider]    Allergies: Augmentin  [amoxicillin -pot clavulanate], Quinapril-hydrochlorothiazide , Tramadol, and Silicone    Review of Systems  Updated Vital Signs BP (!) 156/98 (BP Location: Right Arm)   Pulse 97   Temp 98 F (36.7 C) (Oral)   Resp 18   SpO2 100%  Physical Exam Vitals and nursing note reviewed.  Constitutional:      General: She is not in acute distress.    Appearance: She is well-developed.  HENT:     Head: Normocephalic and atraumatic.  Eyes:     Extraocular Movements: Extraocular movements intact.     Conjunctiva/sclera: Conjunctivae normal.     Pupils: Pupils are equal, round, and reactive to light.  Cardiovascular:     Rate and Rhythm: Normal rate and regular rhythm.     Pulses: Normal pulses.     Heart sounds: Normal heart sounds. No murmur heard. Pulmonary:     Effort: Pulmonary effort is normal. No  respiratory distress.     Breath sounds: Wheezing present.  Abdominal:     Palpations: Abdomen is soft.     Tenderness: There is no abdominal tenderness.  Musculoskeletal:        General: No swelling.     Cervical back: Normal range of motion and neck supple.  Skin:    General: Skin is warm and dry.     Capillary Refill: Capillary refill takes less than 2 seconds.  Neurological:     General: No focal deficit present.     Mental Status: She is alert.  Psychiatric:        Mood and Affect: Mood normal.     (all labs ordered are listed, but only abnormal results are displayed) Labs Reviewed  CBC WITH DIFFERENTIAL/PLATELET - Abnormal; Notable for the following components:      Result Value   RBC 3.55 (*)    HCT 35.5 (*)    MCH 34.1 (*)    All other components within normal limits  BASIC METABOLIC PANEL WITH GFR - Abnormal; Notable for the following components:   CO2 21 (*)    Glucose, Bld 110 (*)    GFR, Estimated 59 (*)    All other components within normal limits  RESP PANEL BY RT-PCR (RSV, FLU A&B, COVID)  RVPGX2  PRO BRAIN NATRIURETIC PEPTIDE  TROPONIN T, HIGH SENSITIVITY    EKG: EKG Interpretation Date/Time:  Thursday May 10 2024 07:34:38 EDT Ventricular Rate:  85 PR Interval:    QRS Duration:  83 QT Interval:  372 QTC Calculation: 443 R Axis:   62  Text Interpretation: Normal sinus rhythm Confirmed by Ruthe Cornet [phone]) on 05/10/2024 7:37:12 AM  Radiology: No results found.   Procedures   Medications Ordered in the ED  albuterol  (VENTOLIN  HFA) 108 (90 Base) MCG/ACT inhaler 2 puff (has no administration in time range)  ipratropium-albuterol  (DUONEB) 0.5-2.5 (3) MG/3ML nebulizer solution 3 mL (3 mLs Nebulization Given 05/10/24 0747)  methylPREDNISolone  sodium succinate (SOLU-MEDROL ) 125 mg/2 mL injection 125 mg (125 mg Intravenous Given 05/10/24 0741)                                    Medical Decision Making Amount and/or Complexity of Data  Reviewed Labs: ordered. Radiology: ordered.  Risk Prescription drug management.   Amy Ray is a well-appearing 78 year old with history of cognitive impairment and asthma.  Comes in with shortness of breath.  She is unremarkable vitals.  No fever.  EKG shows sinus rhythm.  No ischemic changes per my review interpretation.  She has mild end expiratory wheezing on exam.  She is in no respiratory distress.  No chest pain.  Differential diagnosis likely viral process/reactive airway process/asthma exacerbation.  Will evaluate for ACS  volume overload/heart failure.  Have no concern for PE.  Wells criteria 0.  Ultimately will check CBC BMP troponin BNP chest x-ray.  Will give DuoNeb treatment and steroids IV anticipate discharge.  Per my review interpretation labs no significant leukocytosis anemia or electrolyte abnormality.  Troponin normal.  BMP unremarkable.  EKG shows sinus rhythm.  Overall I do not think there is any cardiac or pulmonary process going on other than reactive airway process/asthma.  Viral panel is pending.  Overall chest x-ray per my review interpretation shows no obvious pneumonia pneumothorax.  She is very well-appearing.  She is not in any distress.  She responded well to breathing treatment.  Recommend continue use of albuterol  and prednisone  at home.  Follow-up with primary care.  Discharged in good condition.  This chart was dictated using voice recognition software.  Despite best efforts to proofread,  errors can occur which can change the documentation meaning.      Final diagnoses:  Mild asthma with exacerbation, unspecified whether persistent    ED Discharge Orders          Ordered    predniSONE  (DELTASONE ) 20 MG tablet  Daily        05/10/24 0825               Ruthe Cornet, DO 05/10/24 [phone]

## 2024-05-10 NOTE — ED Triage Notes (Signed)
 Patient reports wheezing and shortness of breath beginning at 1 am. She states she has a hx of asthma. She is able to speak in full sentences but audible expiratory wheezing is noted. She reports she took an albuterol  inhaler and it helped temporarily but it kept coming back.

## 2024-05-10 NOTE — ED Notes (Signed)
 DC paperwork given and verbally understood.

## 2024-05-11 ENCOUNTER — Telehealth: Payer: Self-pay

## 2024-05-11 NOTE — Telephone Encounter (Signed)
 Transition Care Management Follow-up Telephone Call Date of discharge and from where: 05/10/2024 [facility] How have you been since you were released from the hospital? [facility] Any questions or concerns? No  Items Reviewed: Did the pt receive and understand the discharge instructions provided? Yes  Medications obtained and verified? No  Other? Patient daughter/Patient both declined scheduling hospital follow up stating not needed Any new allergies since your discharge? No  Dietary orders reviewed? No Do you have support at home? Yes   Home Care and Equipment/Supplies: Were home health services ordered? no If so, what is the name of the agency?   Has the agency set up a time to come to the patient's home? not applicable Were any new equipment or medical supplies ordered?  No What is the name of the medical supply agency?  Were you able to get the supplies/equipment? not applicable Do you have any questions related to the use of the equipment or supplies? No  Functional Questionnaire: (I = Independent and D = Dependent) ADLs:   Bathing/Dressing-   Meal Prep-   Eating-   Maintaining continence-   Transferring/Ambulation-   Managing Meds-   Follow up appointments reviewed:  PCP Hospital f/u appt confirmed? No  Patient declined scheduling visit Specialist Hospital f/u appt confirmed? No  Are transportation arrangements needed? No  If their condition worsens, is the pt aware to call PCP or go to the Emergency Dept.? Yes Was the patient provided with contact information for the PCP's office or ED? Yes Was to pt encouraged to call back with questions or concerns? Yes

## 2024-05-23 ENCOUNTER — Other Ambulatory Visit: Payer: Self-pay | Admitting: Physician Assistant

## 2024-05-23 DIAGNOSIS — Z1231 Encounter for screening mammogram for malignant neoplasm of breast: Secondary | ICD-10-CM

## 2024-05-29 ENCOUNTER — Telehealth: Payer: Self-pay

## 2024-05-29 NOTE — Telephone Encounter (Signed)
 Returned pt call and advised PCP recommendations; pt verbalized understanding

## 2024-05-29 NOTE — Telephone Encounter (Signed)
 Copied from CRM #[id number]. Topic: Clinical - Medical Advice >> May 29, 2024 10:02 AM Mesmerise C wrote: Reason for CRM: Patient is inquiring if a aspirin  pain reliever capsule 325mg  is okay to take she's never gotten capsules before only tablet can be reached at [phone]  Please see pt call and question and advise

## 2024-06-18 ENCOUNTER — Ambulatory Visit: Payer: Self-pay

## 2024-06-18 ENCOUNTER — Ambulatory Visit (INDEPENDENT_AMBULATORY_CARE_PROVIDER_SITE_OTHER): Admitting: Family Medicine

## 2024-06-18 ENCOUNTER — Encounter: Payer: Self-pay | Admitting: Family Medicine

## 2024-06-18 VITALS — BP 140/74 | HR 95 | Temp 97.4°F | Resp 18 | Ht 60.0 in | Wt 208.4 lb

## 2024-06-18 DIAGNOSIS — I10 Essential (primary) hypertension: Secondary | ICD-10-CM

## 2024-06-18 DIAGNOSIS — Z8673 Personal history of transient ischemic attack (TIA), and cerebral infarction without residual deficits: Secondary | ICD-10-CM | POA: Diagnosis not present

## 2024-06-18 DIAGNOSIS — R9431 Abnormal electrocardiogram [ECG] [EKG]: Secondary | ICD-10-CM | POA: Diagnosis not present

## 2024-06-18 DIAGNOSIS — M79602 Pain in left arm: Secondary | ICD-10-CM | POA: Diagnosis not present

## 2024-06-18 MED ORDER — HYDRALAZINE HCL 10 MG PO TABS: 10.0000 mg | ORAL_TABLET | Freq: Every day | ORAL | 0 refills | Status: AC | PRN

## 2024-06-18 NOTE — Progress Notes (Signed)
 Subjective:     Patient ID: Amy Ray, female    DOB: [birth year], 78 y.o.   MRN: [id number]  Chief Complaint  Patient presents with   Hypertension    Elevated blood pressure mostly at night    HPI Discussed the use of AI scribe software for clinical note transcription with the patient, who gave verbal consent to proceed.  History of Present Illness Amy Ray is a 78 year old female with hypertension who presents with elevated blood pressure and left-sided pain.  She has been experiencing elevated blood pressure readings at night since Saturday, with values reaching 177/90 and 178/115, starting on Saturday night, approximately two to three days ago. Her blood pressure is typically in the 130s to 140s when checked in the morning only, which she does twice a week, but she does not usually check it at night.  On Saturday night, she experienced pain and tingling in her left arm and leg, prompting her to check her blood pressure, which was elevated. No headache, dizziness, or chest pain during these episodes. She also reports a sensation of her L eye feeling 'a little off.' These symptoms have not recurred since Saturday night.  She has a history of hypertension and takes diltiazem  180 mg, two tablets every morning, and furosemide  20 mg for fluid management. She frequently skips her furosemide , particularly when planning to go out, and did not take it on Saturday or Sunday or several days last wk  She mentions hearing a loud heartbeat, especially when lying down at night. She was recently diagnosed with asthma, which she states does not bother her. No history of heart failure.  She has a history of visiting the emergency room in June for elevated blood pressure after choking on water, which resolved on its own. She is scheduled to see a cardiologist in a few weeks for a regular check-up, having previously seen her for shortness of breath.    Health Maintenance Due  Topic Date Due    Influenza Vaccine  05/11/2024    Past Medical History:  Diagnosis Date   Alzheimers disease (HCC)    Anticoagulant long-term use    plavix --- manged by pcp   Asthma    Chronic constipation    Diverticulosis of colon    Generalized abdominal pain    Glaucoma, both eyes    Hemorrhoids    History of CVA with residual deficit 03/16/2005   left facial paresthesia;  per pt note from stroke but in neurologist note is cva residual   History of diverticulitis of colon 05/20/2015   History of lower GI bleeding    Hyperlipidemia, mixed    Hypertension    per previous had nuclear stress test w/ cardiologist w/ dr florencio , results in care everywhere , NUC 02-01-2017  normal , ef 60%;  echo 02-02-2017 ef 50% mild AR/TR; and normal stress echo   MCI (mild cognitive impairment)    neurology--- camie sevin PA ([facility] [facility]);  per note seconardy to vascular and alzeimer disease;  per MRI moderate to extensive CSV ischemia   Nocturia    OA (osteoarthritis)    PMB (postmenopausal bleeding)    Thickened endometrium    Trigeminal neuralgia of left side of face    per pt started after stroke   Vertigo    Wears partial dentures    upper    Past Surgical History:  Procedure Laterality Date   CATARACT EXTRACTION W/ INTRAOCULAR LENS IMPLANT Bilateral    right  2023;  left , yrs ago   CHOLECYSTECTOMY     CHOLECYSTECTOMY, LAPAROSCOPIC  2000   HYSTEROSCOPY WITH D & C N/A 12/20/2022   Procedure: DILATATION AND CURETTAGE;  Surgeon: Lequita Evalene LABOR, MD;  Location: [facility] [place];  Service: Gynecology;  Laterality: N/A;   REVERSE SHOULDER ARTHROPLASTY Bilateral    right 06-25-2022 in [place];   approx.  2017  left side in [place]   TOTAL KNEE ARTHROPLASTY Bilateral    2004;  2005   UMBILICAL HERNIA REPAIR     child     Current Outpatient Medications:    Acetaminophen  (TYLENOL  EX ST ARTHRITIS PAIN PO), Take 2 tablets by mouth daily as needed (for arthritis pain)., Disp: , Rfl:     albuterol  (VENTOLIN  HFA) 108 (90 Base) MCG/ACT inhaler, Inhale 1-2 puffs into the lungs every 6 (six) hours as needed for wheezing or shortness of breath., Disp: 8 g, Rfl: 6   aspirin  81 MG tablet, Take 1 tablet by mouth daily., Disp: , Rfl:    brimonidine (ALPHAGAN) 0.2 % ophthalmic solution, SMARTSIG:In Eye(s), Disp: , Rfl:    budesonide -formoterol  (SYMBICORT ) 160-4.5 MCG/ACT inhaler, Inhale 2 puffs into the lungs 2 (two) times daily., Disp: 1 each, Rfl: 12   carbamazepine  (TEGRETOL ) 200 MG tablet, TAKE 1 TABLET TWICE DAILY, Disp: 180 tablet, Rfl: 3   clopidogrel  (PLAVIX ) 75 MG tablet, TAKE 1 TABLET EVERY DAY, Disp: 90 tablet, Rfl: 3   diltiazem  (DILACOR XR ) 180 MG 24 hr capsule, TAKE 2 CAPSULES EVERY DAY, Disp: 180 capsule, Rfl: 3   dorzolamide (TRUSOPT) 2 % ophthalmic solution, SMARTSIG:In Eye(s), Disp: , Rfl:    famotidine  (PEPCID ) 40 MG tablet, TAKE 1 TABLET AT BEDTIME, Disp: 90 tablet, Rfl: 3   furosemide  (LASIX ) 20 MG tablet, TAKE 1 TABLET EVERY DAY, Disp: 90 tablet, Rfl: 3   gabapentin  (NEURONTIN ) 100 MG capsule, Take 1 capsule (100 mg total) by mouth 2 (two) times daily as needed., Disp: 30 capsule, Rfl: 0   hydrALAZINE  (APRESOLINE ) 10 MG tablet, Take 1 tablet (10 mg total) by mouth daily as needed. If bp >170 and feeling poorly, Disp: 10 tablet, Rfl: 0   HYDROcodone -acetaminophen  (NORCO/VICODIN) 5-325 MG tablet, Take 1 tablet by mouth every 6 (six) hours as needed for moderate pain (pain score 4-6)., Disp: 15 tablet, Rfl: 0   lidocaine  (XYLOCAINE ) 2 % solution, , Disp: , Rfl:    nortriptyline  (PAMELOR ) 10 MG capsule, TAKE 1 TO 2 CAPSULES BY MOUTH AT BEDTIME, Disp: 180 capsule, Rfl: 3   SIMBRINZA 1-0.2 % SUSP, Place 1 drop into both eyes 2 (two) times daily., Disp: , Rfl:    simvastatin  (ZOCOR ) 10 MG tablet, TAKE 1 TABLET AT BEDTIME, Disp: 90 tablet, Rfl: 3   triamcinolone  (KENALOG ) 0.1 % paste, 1 Application., Disp: , Rfl:    VITAMIN D PO, Take 1 capsule by mouth daily., Disp: , Rfl:    Allergies  Allergen Reactions   Augmentin  [Amoxicillin -Pot Clavulanate] Hives   Quinapril-Hydrochlorothiazide  Other (See Comments) and Cough    Other reaction(s): Headache     Tramadol     Dizzy, headache, nosebleed, near-syncope   Silicone Rash   ROS neg/noncontributory except as noted HPI/below      Objective:     BP (!) 140/74 (BP Location: Left Arm, Patient Position: Sitting, Cuff Size: Large)   Pulse 95   Temp (!) 97.4 F (36.3 C) (Temporal)   Resp 18   Ht 5' (1.524 m)   Wt 208 lb 6  oz (94.5 kg)   SpO2 100%   BMI 40.70 kg/m  Wt Readings from Last 3 Encounters:  06/18/24 208 lb 6 oz (94.5 kg)  04/30/24 202 lb (91.6 kg)  01/18/24 202 lb (91.6 kg)    Physical Exam   Gen: WDWN NAD HEENT: NCAT, conjunctiva not injected, sclera nonicteric NECK:  supple, no thyromegaly, no nodes, no carotid bruits CARDIAC: RRR, S1S2+,?S3 no murmur.  LUNGS: CTAB. No wheezes ABDOMEN:  BS+, soft, NTND, No HSM, no masses EXT:  no edema MSK: no gross abnormalities.  NEURO: A&O x3.  CN II-XII intact.  PSYCH: normal mood. Good eye contact  1st EKG: a lot of baseline artifact in some of the leads so cannot confirm afib/flutter but cannot discern consistent P waves.  Repeat EKG: still too much baseline artifact but more of sinus rhythm.  No acute T waves     Assessment & Plan:  History of CVA (cerebrovascular accident) -     EKG 12-Lead  Primary hypertension -     EKG 12-Lead  Pain of left upper extremity -     EKG 12-Lead  Abnormal EKG  Other orders -     hydrALAZINE  HCl; Take 1 tablet (10 mg total) by mouth daily as needed. If bp >170 and feeling poorly  Dispense: 10 tablet; Refill: 0  Assessment and Plan Assessment & Plan Essential hypertension with episodic nocturnal elevation and associated left arm pain and paresthesia, resolved   Hypertension episodes at night reached 177/90 to 178/115, with left arm pain and paresthesia, starting Saturday night and resolving  quickly. No chest pain, headache, or dizziness occurred. Daytime blood pressure typically ranges from 130s to 140s. Missed furosemide  doses may contribute. Cardiac issues are considered, but recent echo and heart monitoring were normal. Cardiologist follow-up is scheduled in a few weeks. Check blood pressure every morning and evening. Note furosemide  intake. Prescribe hydralazine  if blood pressure exceeds 170 with symptoms. Order EKG to evaluate cardiac issues. Discuss with cardiologist at the upcoming appointment.  Edema   Edema is managed with furosemide  20 mg daily. No significant swelling was noted during the visit. Missed furosemide  doses may correlate with elevated blood pressure episodes. The role of furosemide  in managing both edema and blood pressure was discussed. Continue furosemide  20 mg daily. Monitor for swelling and adjust furosemide  as needed.  Abn EKG-cannot r/o afib intermitt.  Pt rate controlled.  Will msg card.    H/o CVA-on plavix     Return for as sch in Oct.  Jenkins CHRISTELLA Carrel, MD

## 2024-06-18 NOTE — Patient Instructions (Addendum)
 It was very nice to see you today!  Check blood pressure in am and pm.  Note when take furosemide .    Hydralazine  ONLY if >170 and feeling poorly   PLEASE NOTE:  If you had any lab tests please let us  know if you have not heard back within a few days. You may see your results on MyChart before we have a chance to review them but we will give you a call once they are reviewed by us . If we ordered any referrals today, please let us  know if you have not heard from their office within the next week.   Please try these tips to maintain a healthy lifestyle:  Eat most of your calories during the day when you are active. Eliminate processed foods including packaged sweets (pies, cakes, cookies), reduce intake of potatoes, white bread, white pasta, and white rice. Look for whole grain options, oat flour or almond flour.  Each meal should contain half fruits/vegetables, one quarter protein, and one quarter carbs (no bigger than a computer mouse).  Cut down on sweet beverages. This includes juice, soda, and sweet tea. Also watch fruit intake, though this is a healthier sweet option, it still contains natural sugar! Limit to 3 servings daily.  Drink at least 1 glass of water with each meal and aim for at least 8 glasses per day  Exercise at least 150 minutes every week.

## 2024-06-18 NOTE — Telephone Encounter (Signed)
Scheduled for today at 1:15pm. °

## 2024-06-18 NOTE — Telephone Encounter (Signed)
 FYI Only or Action Required?: FYI only for provider.  Patient was last seen in primary care on 01/06/2024 by Allwardt, Mardy HERO, PA-C.  Called Nurse Triage reporting Hypertension.  Copied from CRM [id number]. Topic: Clinical - Red Word Triage >> Jun 18, 2024  9:38 AM Jasmin G wrote: [facility] that prompted transfer to Nurse Triage: Pt states that her bp has been high for the past couple of days, today being at 179/116. Answer Assessment - Initial Assessment Questions RN attempted triage, pt did not want to speak to RN. Pt repeatedly states I need to see a doctor. RN attempted questioning current health status, pt replied, my BP is high at night and good in the morning. Pt asked if it she would be willing to answer triage questions, pt states why do I have to always talk to so many people, I just want to see my doctor. Pt scheduled with provider in PCP clinic.  Protocols used: Blood Pressure - High-A-AH

## 2024-06-21 ENCOUNTER — Ambulatory Visit: Attending: Cardiology

## 2024-06-22 ENCOUNTER — Ambulatory Visit
Admission: RE | Admit: 2024-06-22 | Discharge: 2024-06-22 | Disposition: A | Discharge status: Home / Self Care | Source: Ambulatory Visit | Attending: Physician Assistant | Admitting: Physician Assistant

## 2024-06-22 DIAGNOSIS — Z1231 Encounter for screening mammogram for malignant neoplasm of breast: Secondary | ICD-10-CM | POA: Diagnosis not present

## 2024-06-25 ENCOUNTER — Other Ambulatory Visit: Payer: Self-pay

## 2024-06-25 ENCOUNTER — Emergency Department (HOSPITAL_BASED_OUTPATIENT_CLINIC_OR_DEPARTMENT_OTHER)
Admission: EM | Admit: 2024-06-25 | Discharge: 2024-06-25 | Disposition: A | Discharge status: Home / Self Care | Attending: Emergency Medicine | Admitting: Emergency Medicine

## 2024-06-25 ENCOUNTER — Encounter (HOSPITAL_BASED_OUTPATIENT_CLINIC_OR_DEPARTMENT_OTHER): Payer: Self-pay

## 2024-06-25 ENCOUNTER — Emergency Department (HOSPITAL_BASED_OUTPATIENT_CLINIC_OR_DEPARTMENT_OTHER): Admitting: Radiology

## 2024-06-25 DIAGNOSIS — J45909 Unspecified asthma, uncomplicated: Secondary | ICD-10-CM | POA: Insufficient documentation

## 2024-06-25 DIAGNOSIS — R0600 Dyspnea, unspecified: Secondary | ICD-10-CM

## 2024-06-25 DIAGNOSIS — I771 Stricture of artery: Secondary | ICD-10-CM | POA: Diagnosis not present

## 2024-06-25 DIAGNOSIS — R9389 Abnormal findings on diagnostic imaging of other specified body structures: Secondary | ICD-10-CM | POA: Diagnosis not present

## 2024-06-25 DIAGNOSIS — R0602 Shortness of breath: Secondary | ICD-10-CM | POA: Diagnosis not present

## 2024-06-25 DIAGNOSIS — I10 Essential (primary) hypertension: Secondary | ICD-10-CM | POA: Diagnosis not present

## 2024-06-25 DIAGNOSIS — Z7982 Long term (current) use of aspirin: Secondary | ICD-10-CM | POA: Insufficient documentation

## 2024-06-25 DIAGNOSIS — Z79899 Other long term (current) drug therapy: Secondary | ICD-10-CM | POA: Diagnosis not present

## 2024-06-25 DIAGNOSIS — R079 Chest pain, unspecified: Secondary | ICD-10-CM | POA: Diagnosis not present

## 2024-06-25 DIAGNOSIS — Z96612 Presence of left artificial shoulder joint: Secondary | ICD-10-CM | POA: Diagnosis not present

## 2024-06-25 LAB — CBC
HCT: 36.8 % (ref 36.0–46.0)
Hemoglobin: 12.1 g/dL (ref 12.0–15.0)
MCH: 33.8 pg (ref 26.0–34.0)
MCHC: 32.9 g/dL (ref 30.0–36.0)
MCV: 102.8 fL — ABNORMAL HIGH (ref 80.0–100.0)
Platelets: 159 K/uL (ref 150–400)
RBC: 3.58 MIL/uL — ABNORMAL LOW (ref 3.87–5.11)
RDW: 12.4 % (ref 11.5–15.5)
WBC: 4.3 K/uL (ref 4.0–10.5)
nRBC: 0 % (ref 0.0–0.2)

## 2024-06-25 LAB — BASIC METABOLIC PANEL WITH GFR
Anion gap: 13 (ref 5–15)
BUN: 22 mg/dL (ref 8–23)
CO2: 21 mmol/L — ABNORMAL LOW (ref 22–32)
Calcium: 9.2 mg/dL (ref 8.9–10.3)
Chloride: 104 mmol/L (ref 98–111)
Creatinine, Ser: 1.04 mg/dL — ABNORMAL HIGH (ref 0.44–1.00)
GFR, Estimated: 55 mL/min — ABNORMAL LOW (ref >60)
Glucose, Bld: 99 mg/dL (ref 70–99)
Potassium: 4.2 mmol/L (ref 3.5–5.1)
Sodium: 139 mmol/L (ref 135–145)

## 2024-06-25 LAB — TROPONIN T, HIGH SENSITIVITY: Troponin T High Sensitivity: <15 ng/L (ref 0–19)

## 2024-06-25 NOTE — ED Provider Notes (Signed)
 [name] Provider Note   CSN: [id number] Arrival date & time: 06/25/24  1726     Patient presents with: Shortness of Breath   Amy Ray is a 78 y.o. female.   Patient is a 78 year old female with past medical history of asthma, hyperlipidemia, prior CVA, hypertension.  Patient presenting today with complaints of shortness of breath.  She describes wheezing intermittently for the past 2 days.  She does get some relief with her inhalers and nebulizer at home.  No fevers or chills.  No productive cough.  She denies to me she is having any chest pain.  No ill contacts.       Prior to Admission medications   Medication Sig Start Date End Date Taking? Authorizing Provider  Acetaminophen  (TYLENOL  EX ST ARTHRITIS PAIN PO) Take 2 tablets by mouth daily as needed (for arthritis pain).    [provider]  albuterol  (VENTOLIN  HFA) 108 (90 Base) MCG/ACT inhaler Inhale 1-2 puffs into the lungs every 6 (six) hours as needed for wheezing or shortness of breath. 09/20/23   Tamea Dedra CROME, MD  aspirin  81 MG tablet Take 1 tablet by mouth daily.    [provider]  brimonidine (ALPHAGAN) 0.2 % ophthalmic solution SMARTSIG:In Eye(s) 03/26/24   [provider]  budesonide -formoterol  (SYMBICORT ) 160-4.5 MCG/ACT inhaler Inhale 2 puffs into the lungs 2 (two) times daily. 08/26/23   Tamea Dedra CROME, MD  carbamazepine  (TEGRETOL ) 200 MG tablet TAKE 1 TABLET TWICE DAILY 09/12/23   Allwardt, Alyssa M, PA-C  clopidogrel  (PLAVIX ) 75 MG tablet TAKE 1 TABLET EVERY DAY 01/13/24   Allwardt, Alyssa M, PA-C  diltiazem  (DILACOR XR ) 180 MG 24 hr capsule TAKE 2 CAPSULES EVERY DAY 08/29/23   Allwardt, Alyssa M, PA-C  dorzolamide (TRUSOPT) 2 % ophthalmic solution SMARTSIG:In Eye(s) 03/26/24   [provider]  famotidine  (PEPCID ) 40 MG tablet TAKE 1 TABLET AT BEDTIME 01/13/24   Allwardt, Alyssa M, PA-C  furosemide  (LASIX ) 20 MG tablet TAKE 1  TABLET EVERY DAY 11/10/23   Allwardt, Alyssa M, PA-C  gabapentin  (NEURONTIN ) 100 MG capsule Take 1 capsule (100 mg total) by mouth 2 (two) times daily as needed. 08/05/23   Magnant, Carlin CROME, PA-C  hydrALAZINE  (APRESOLINE ) 10 MG tablet Take 1 tablet (10 mg total) by mouth daily as needed. If bp >170 and feeling poorly 06/18/24   Wendolyn Jenkins Jansky, MD  HYDROcodone -acetaminophen  (NORCO/VICODIN) 5-325 MG tablet Take 1 tablet by mouth every 6 (six) hours as needed for moderate pain (pain score 4-6). 08/16/23   Steinl, Kevin, MD  lidocaine  (XYLOCAINE ) 2 % solution  12/20/23   [provider]  nortriptyline  (PAMELOR ) 10 MG capsule TAKE 1 TO 2 CAPSULES BY MOUTH AT BEDTIME 01/15/24   Allwardt, Alyssa M, PA-C  SIMBRINZA 1-0.2 % SUSP Place 1 drop into both eyes 2 (two) times daily. 05/27/22   [provider]  simvastatin  (ZOCOR ) 10 MG tablet TAKE 1 TABLET AT BEDTIME 11/10/23   Allwardt, Alyssa M, PA-C  triamcinolone  (KENALOG ) 0.1 % paste 1 Application. 12/20/23   [provider]  VITAMIN D PO Take 1 capsule by mouth daily.    [provider]    Allergies: Augmentin  [amoxicillin -pot clavulanate], Quinapril-hydrochlorothiazide , Tramadol, and Silicone    Review of Systems  All other systems reviewed and are negative.   Updated Vital Signs BP (!) 158/73   Pulse 74   Temp 98.6 F (37 C) (Oral)   Resp 15   SpO2 100%  Physical Exam Vitals and nursing note reviewed.  Constitutional:      General: She is not in acute distress.    Appearance: She is well-developed. She is not diaphoretic.  HENT:     Head: Normocephalic and atraumatic.  Cardiovascular:     Rate and Rhythm: Normal rate and regular rhythm.     Heart sounds: No murmur heard.    No friction rub. No gallop.  Pulmonary:     Effort: Pulmonary effort is normal. No respiratory distress.     Breath sounds: Normal breath sounds. No wheezing.  Abdominal:     General: Bowel sounds are normal. There is no distension.      Palpations: Abdomen is soft.     Tenderness: There is no abdominal tenderness.  Musculoskeletal:        General: Normal range of motion.     Cervical back: Normal range of motion and neck supple.     Right lower leg: No tenderness. No edema.     Left lower leg: No tenderness. No edema.  Skin:    General: Skin is warm and dry.  Neurological:     General: No focal deficit present.     Mental Status: She is alert and oriented to person, place, and time.     (all labs ordered are listed, but only abnormal results are displayed) Labs Reviewed  BASIC METABOLIC PANEL WITH GFR - Abnormal; Notable for the following components:      Result Value   CO2 21 (*)    Creatinine, Ser 1.04 (*)    GFR, Estimated 55 (*)    All other components within normal limits  CBC - Abnormal; Notable for the following components:   RBC 3.58 (*)    MCV 102.8 (*)    All other components within normal limits  TROPONIN T, HIGH SENSITIVITY  TROPONIN T, HIGH SENSITIVITY    EKG: EKG Interpretation Date/Time:  Monday June 25 2024 17:37:28 EDT Ventricular Rate:  83 PR Interval:  146 QRS Duration:  74 QT Interval:  350 QTC Calculation: 411 R Axis:   61  Text Interpretation: Normal sinus rhythm with sinus arrhythmia no acute St/T changes similar to July 2025 Confirmed by Freddi Hamilton [phone]) on 06/25/2024 9:48:45 PM  Radiology: [facility] Chest 2 View Result Date: 06/25/2024 CLINICAL DATA:  Chest pain. EXAM: CHEST - 2 VIEW COMPARISON:  Radiograph 05/10/2024.  CT 07/03/2023 FINDINGS: Chronic rightward mediastinal shift. Stable heart size and mediastinal contours with aortic tortuosity. No confluent opacity, large pleural effusion or pneumothorax. No pulmonary edema. Bilateral shoulder arthroplasties. IMPRESSION: No acute chest findings. Electronically Signed   By: Andrea Gasman M.D.   On: 06/25/2024 18:22     Procedures   Medications Ordered in the ED - No data to display                                   Medical Decision Making Amount and/or Complexity of Data Reviewed Labs: ordered. Radiology: ordered.   Patient presenting with shortness of breath as described in the HPI.  Patient arrives here with stable vital signs and is afebrile.  There is no hypoxia with oxygen saturations of 98% on room air at the time of my evaluation.  Physical examination is unremarkable.  She is in no respiratory distress and lungs are clear.  Laboratory studies obtained including CBC, metabolic panel, and troponin, all of which are unremarkable.  Chest  x-ray is clear.  At this point, patient is actually feeling somewhat improved.  With her workup unremarkable and symptoms improving, I feel as though she can safely be discharged.  I will have her continue her nebulizer at home and return as needed for any problems.  I have considered a cardiac etiology, however her troponin is normal and EKG is unchanged and symptoms are very atypical.  I do not feel as though any further workup is indicated into this.  Patient to return as needed.     Final diagnoses:  None    ED Discharge Orders     None          Geroldine Berg, MD 06/25/24 2321

## 2024-06-25 NOTE — Discharge Instructions (Signed)
 Continue use of your nebulizer every 4 hours as needed for wheezing/difficulty breathing.  Return to the ER if you develop worsening breathing, severe chest pain, high fever, or for other new and concerning symptoms.

## 2024-06-25 NOTE — ED Triage Notes (Signed)
 Pt c/o slight asthma, stiffness, dizziness, double vision, centralized CP onset sometime last night, got a little worse today.   Rescue inhaler w relief.

## 2024-06-26 ENCOUNTER — Telehealth: Payer: Self-pay

## 2024-06-26 NOTE — Telephone Encounter (Signed)
 Transition Care Management Follow-up Telephone Call Date of discharge and from where: 06/25/24 [facility] How have you been since you were released from the hospital? [facility] Any questions or concerns? No  Items Reviewed: Did the pt receive and understand the discharge instructions provided? Yes  Medications obtained and verified? No  Other? No  Any new allergies since your discharge? No  Dietary orders reviewed? No Do you have support at home? Yes   Home Care and Equipment/Supplies: Were home health services ordered? not applicable If so, what is the name of the agency?   Has the agency set up a time to come to the patient's home? not applicable Were any new equipment or medical supplies ordered?  No What is the name of the medical supply agency?  Were you able to get the supplies/equipment? not applicable Do you have any questions related to the use of the equipment or supplies? No  Functional Questionnaire: (I = Independent and D = Dependent) ADLs: I  Bathing/Dressing- I  Meal Prep- I  Eating- I  Maintaining continence- I  Transferring/Ambulation- I  Managing Meds- I  Follow up appointments reviewed:  PCP Hospital f/u appt confirmed? No follow up indicated needed per discharge Scheduled to see PCP 07/25/24 [facility] f/u appt confirmed? No   Are transportation arrangements needed? No  If their condition worsens, is the pt aware to call PCP or go to the Emergency Dept.? Yes Was the patient provided with contact information for the PCP's office or ED? Yes Was to pt encouraged to call back with questions or concerns? Yes

## 2024-07-10 ENCOUNTER — Ambulatory Visit: Admitting: Cardiology

## 2024-07-16 ENCOUNTER — Ambulatory Visit: Admitting: Physician Assistant

## 2024-07-18 ENCOUNTER — Ambulatory Visit: Admitting: Pulmonary Disease

## 2024-07-19 ENCOUNTER — Emergency Department (HOSPITAL_BASED_OUTPATIENT_CLINIC_OR_DEPARTMENT_OTHER)
Admission: EM | Admit: 2024-07-19 | Discharge: 2024-07-19 | Disposition: A | Discharge status: Home / Self Care | Attending: Emergency Medicine | Admitting: Emergency Medicine

## 2024-07-19 ENCOUNTER — Emergency Department (HOSPITAL_BASED_OUTPATIENT_CLINIC_OR_DEPARTMENT_OTHER)

## 2024-07-19 ENCOUNTER — Other Ambulatory Visit: Payer: Self-pay

## 2024-07-19 DIAGNOSIS — X501XXA Overexertion from prolonged static or awkward postures, initial encounter: Secondary | ICD-10-CM | POA: Diagnosis not present

## 2024-07-19 DIAGNOSIS — M549 Dorsalgia, unspecified: Secondary | ICD-10-CM | POA: Diagnosis present

## 2024-07-19 DIAGNOSIS — M5441 Lumbago with sciatica, right side: Secondary | ICD-10-CM | POA: Diagnosis not present

## 2024-07-19 DIAGNOSIS — K4090 Unilateral inguinal hernia, without obstruction or gangrene, not specified as recurrent: Secondary | ICD-10-CM | POA: Diagnosis not present

## 2024-07-19 DIAGNOSIS — R109 Unspecified abdominal pain: Secondary | ICD-10-CM | POA: Insufficient documentation

## 2024-07-19 DIAGNOSIS — M5431 Sciatica, right side: Secondary | ICD-10-CM | POA: Diagnosis not present

## 2024-07-19 DIAGNOSIS — Z7982 Long term (current) use of aspirin: Secondary | ICD-10-CM | POA: Insufficient documentation

## 2024-07-19 DIAGNOSIS — K5730 Diverticulosis of large intestine without perforation or abscess without bleeding: Secondary | ICD-10-CM | POA: Diagnosis not present

## 2024-07-19 DIAGNOSIS — R350 Frequency of micturition: Secondary | ICD-10-CM | POA: Diagnosis not present

## 2024-07-19 LAB — URINALYSIS, ROUTINE W REFLEX MICROSCOPIC
Bilirubin Urine: NEGATIVE
Glucose, UA: NEGATIVE mg/dL
Hgb urine dipstick: NEGATIVE
Ketones, ur: NEGATIVE mg/dL
Leukocytes,Ua: NEGATIVE
Nitrite: NEGATIVE
Protein, ur: NEGATIVE mg/dL
Specific Gravity, Urine: 1.014 (ref 1.005–1.030)
pH: 5.5 (ref 5.0–8.0)

## 2024-07-19 MED ORDER — PREDNISONE 20 MG PO TABS
40.0000 mg | ORAL_TABLET | Freq: Once | ORAL | Status: AC
  Administered 2024-07-19: 40 mg via ORAL
  Filled 2024-07-19: qty 2

## 2024-07-19 MED ORDER — PREDNISONE 10 MG PO TABS: ORAL_TABLET | ORAL | 0 refills | Status: AC

## 2024-07-19 NOTE — ED Notes (Signed)
 Bloodwork: Attempted right AC, unsuccessful, known hard stick per patient.

## 2024-07-19 NOTE — Discharge Instructions (Addendum)
 Take the steroids to help with your pain and discomfort.  You can also supplement with over-the-counter Tylenol  lidocaine  pain patches.  The CT scan also showed some findings suggestive of constipation.  Try increasing your Metamucil to twice daily.  Follow-up with your doctor next week to be rechecked as planned

## 2024-07-19 NOTE — ED Provider Notes (Signed)
 [place] EMERGENCY DEPARTMENT AT [facility] Provider Note   CSN: [id number] Arrival date & time: 07/19/24  2056     Patient presents with: Back Pain   Amy Ray is a 78 y.o. female.  {Add pertinent medical, surgical, social history, OB history to HPI:32947}  Back Pain    Patient presents to the ED with complaints of back pain.  Patient has a history of prior stroke, sciatica diverticulitis abdominal pain who presents to the ED with complaints of back pain ongoing for the last several days.  Patient states she is also noted some urinary frequency.  Initially it occurred after bending over but then it resolved and then started in the last couple days.  She denies any fevers or chills.  The pain is in the right side but does radiate towards the front.  Sometimes it goes down her leg.  Prior to Admission medications   Medication Sig Start Date End Date Taking? Authorizing Provider  Acetaminophen  (TYLENOL  EX ST ARTHRITIS PAIN PO) Take 2 tablets by mouth daily as needed (for arthritis pain).    [provider]  albuterol  (VENTOLIN  HFA) 108 (90 Base) MCG/ACT inhaler Inhale 1-2 puffs into the lungs every 6 (six) hours as needed for wheezing or shortness of breath. 09/20/23   Tamea Dedra CROME, MD  aspirin  81 MG tablet Take 1 tablet by mouth daily.    [provider]  brimonidine (ALPHAGAN) 0.2 % ophthalmic solution SMARTSIG:In Eye(s) 03/26/24   [provider]  budesonide -formoterol  (SYMBICORT ) 160-4.5 MCG/ACT inhaler Inhale 2 puffs into the lungs 2 (two) times daily. 08/26/23   Tamea Dedra CROME, MD  carbamazepine  (TEGRETOL ) 200 MG tablet TAKE 1 TABLET TWICE DAILY 09/12/23   Allwardt, Alyssa M, PA-C  clopidogrel  (PLAVIX ) 75 MG tablet TAKE 1 TABLET EVERY DAY 01/13/24   Allwardt, Alyssa M, PA-C  diltiazem  (DILACOR XR ) 180 MG 24 hr capsule TAKE 2 CAPSULES EVERY DAY 08/29/23   Allwardt, Alyssa M, PA-C  dorzolamide (TRUSOPT) 2 % ophthalmic solution  SMARTSIG:In Eye(s) 03/26/24   [provider]  famotidine  (PEPCID ) 40 MG tablet TAKE 1 TABLET AT BEDTIME 01/13/24   Allwardt, Alyssa M, PA-C  furosemide  (LASIX ) 20 MG tablet TAKE 1 TABLET EVERY DAY 11/10/23   Allwardt, Alyssa M, PA-C  gabapentin  (NEURONTIN ) 100 MG capsule Take 1 capsule (100 mg total) by mouth 2 (two) times daily as needed. 08/05/23   Magnant, Charles L, PA-C  hydrALAZINE  (APRESOLINE ) 10 MG tablet Take 1 tablet (10 mg total) by mouth daily as needed. If bp >170 and feeling poorly 06/18/24   Wendolyn Jenkins Jansky, MD  HYDROcodone -acetaminophen  (NORCO/VICODIN) 5-325 MG tablet Take 1 tablet by mouth every 6 (six) hours as needed for moderate pain (pain score 4-6). 08/16/23   Steinl, Kevin, MD  lidocaine  (XYLOCAINE ) 2 % solution  12/20/23   [provider]  nortriptyline  (PAMELOR ) 10 MG capsule TAKE 1 TO 2 CAPSULES BY MOUTH AT BEDTIME 01/15/24   Allwardt, Alyssa M, PA-C  SIMBRINZA 1-0.2 % SUSP Place 1 drop into both eyes 2 (two) times daily. 05/27/22   [provider]  simvastatin  (ZOCOR ) 10 MG tablet TAKE 1 TABLET AT BEDTIME 11/10/23   Allwardt, Alyssa M, PA-C  triamcinolone  (KENALOG ) 0.1 % paste 1 Application. 12/20/23   [provider]  VITAMIN D PO Take 1 capsule by mouth daily.    [provider]    Allergies: Augmentin  [amoxicillin -pot clavulanate], Quinapril-hydrochlorothiazide , Tramadol, and Silicone    Review of Systems  Musculoskeletal:  Positive  for back pain.    Updated Vital Signs BP 136/73 (BP Location: Left Arm)   Pulse 94   Temp 98.3 F (36.8 C) (Oral)   Resp 18   SpO2 100%   Physical Exam Vitals and nursing note reviewed.  Constitutional:      General: She is not in acute distress.    Appearance: She is well-developed.  HENT:     Head: Normocephalic and atraumatic.     Right Ear: External ear normal.     Left Ear: External ear normal.  Eyes:     General: No scleral icterus.       Right eye: No discharge.        Left eye:  No discharge.     Conjunctiva/sclera: Conjunctivae normal.  Neck:     Trachea: No tracheal deviation.  Cardiovascular:     Rate and Rhythm: Normal rate and regular rhythm.  Pulmonary:     Effort: Pulmonary effort is normal. No respiratory distress.     Breath sounds: Normal breath sounds. No stridor. No wheezing or rales.  Abdominal:     General: Bowel sounds are normal. There is no distension.     Palpations: Abdomen is soft.     Tenderness: There is no abdominal tenderness. There is no guarding or rebound.  Musculoskeletal:        General: No tenderness or deformity.     Cervical back: Neck supple.     Comments: No tenderness palpation paraspinal region  Skin:    General: Skin is warm and dry.     Findings: No rash.  Neurological:     General: No focal deficit present.     Mental Status: She is alert.     Cranial Nerves: No cranial nerve deficit, dysarthria or facial asymmetry.     Sensory: No sensory deficit.     Motor: No abnormal muscle tone or seizure activity.     Coordination: Coordination normal.  Psychiatric:        Mood and Affect: Mood normal.     (all labs ordered are listed, but only abnormal results are displayed) Labs Reviewed  URINALYSIS, ROUTINE W REFLEX MICROSCOPIC - Abnormal; Notable for the following components:      Result Value   APPearance HAZY (*)    Bacteria, UA RARE (*)    All other components within normal limits    EKG: None  Radiology: No results found.  {Document cardiac monitor, telemetry assessment procedure when appropriate:32947} Procedures   Medications Ordered in the ED - No data to display  Clinical Course as of 07/19/24 2145  Thu Jul 19, 2024  2137 Urinalysis, Routine w reflex microscopic -Urine, Clean Catch(!) Urinalysis not suggestive of infection [JK]    Clinical Course User Index [JK] Randol Simmonds, MD   {Click here for ABCD2, HEART and other calculators REFRESH Note before signing:1}                               Medical Decision Making Amount and/or Complexity of Data Reviewed Labs: ordered. Decision-making details documented in ED Course. Radiology: ordered.   ***  {Document critical care time when appropriate  Document review of labs and clinical decision tools ie CHADS2VASC2, etc  Document your independent review of radiology images and any outside records  Document your discussion with family members, caretakers and with consultants  Document social determinants of health affecting pt's care  Document your decision making  why or why not admission, treatments were needed:32947:::1}   Final diagnoses:  None    ED Discharge Orders     None

## 2024-07-19 NOTE — ED Triage Notes (Signed)
 Pt POV reporting R lower back pain since Saturday after bending over wrong, went away Monday but returned after a few days. Also reporting increased urinary frequency.

## 2024-07-19 NOTE — ED Notes (Signed)
 Patient transported to CT

## 2024-07-19 NOTE — ED Notes (Signed)
 Reviewed AVS/discharge instruction with patient. Time allotted for and all questions answered. Patient is agreeable for d/c and escorted to ed exit by staff.

## 2024-07-24 ENCOUNTER — Encounter: Payer: Self-pay | Admitting: Pulmonary Disease

## 2024-07-24 ENCOUNTER — Ambulatory Visit: Admitting: Pulmonary Disease

## 2024-07-24 VITALS — BP 128/80 | HR 92 | Temp 97.6°F | Ht 60.0 in | Wt 206.4 lb

## 2024-07-24 DIAGNOSIS — Z8673 Personal history of transient ischemic attack (TIA), and cerebral infarction without residual deficits: Secondary | ICD-10-CM | POA: Diagnosis not present

## 2024-07-24 DIAGNOSIS — J4540 Moderate persistent asthma, uncomplicated: Secondary | ICD-10-CM | POA: Diagnosis not present

## 2024-07-24 MED ORDER — ALBUTEROL SULFATE HFA 108 (90 BASE) MCG/ACT IN AERS: 1.0000 | INHALATION_SPRAY | Freq: Four times a day (QID) | RESPIRATORY_TRACT | 6 refills | Status: AC | PRN

## 2024-07-24 MED ORDER — SYMBICORT 160-4.5 MCG/ACT IN AERO: 2.0000 | INHALATION_SPRAY | Freq: Two times a day (BID) | RESPIRATORY_TRACT | 12 refills | Status: AC

## 2024-07-24 NOTE — Progress Notes (Signed)
 Subjective:    Patient ID: Amy Ray, female    DOB: October 13, 1945, 78 y.o.   MRN: [id number]  Patient Care Team: Allwardt, Mardy HERO, PA-C as PCP - General (Physician Assistant) Jeffrie Oneil BROCKS, MD as PCP - Cardiology (Cardiology) Mevelyn JONETTA Bathe, OD as Consulting Physician (Optometry) Cleotilde Barrio, MD (Orthopedic Surgery) Florencio Cara JONETTA, MD as Consulting Physician (Cardiology) Pa, [facility] (Ophthalmology)  Chief Complaint  Patient presents with   Asthma    No breathing problems.     BACKGROUND/INTERVAL:This is a 78 year old lifelong never smoker with a history as noted below, who presents for follow-up of shortness of breath present since February 2024, worse with activity and improved with albuterol .  She was initially seen here on 03 August 2023.  Patient was treated with Trelegy Ellipta  for presumed asthma.  Trelegy Ellipta  had to be switched to Symbicort  due to the patient not tolerating taste of the medication.  Patient was last seen here on 18 January 2024 and was doing well at that time.  Continues to do well with Symbicort    HPI Discussed the use of AI scribe software for clinical note transcription with the patient, who gave verbal consent to proceed.  History of Present Illness   Amy Ray is a 78 year old female with moderate persistent asthma who presents for follow-up.  She presents with her daughter [name].  Her breathing has been stable, and she has not required her rescue inhaler recently. She continues to use Symbicort  twice daily and only uses her rescue inhaler if she experiences wheezing. She ensures to rinse her mouth after using her inhaler.  She describes experiencing swelling, referring to it as 'swollen up.' She recalls her blood pressure being noted as 120 but does not remember further details.  She had a chest x-ray in September. She has received her flu shot and stays up to date with all vaccinations.  No recent use of rescue  inhaler, reports good breathing, and confirms use of Symbicort  twice daily. She is a lifelong never smoker.     She does not endorse any other symptomatology.  Review of Systems A 10 point review of systems was performed and it is as noted above otherwise negative.   Patient Active Problem List   Diagnosis Date Noted   Mild intermittent asthma without complication 01/06/2024   Chronic pain of both knees 01/21/2023   Bilateral lower extremity edema 01/21/2023   History of reverse total replacement of right shoulder joint 01/11/2023   Osteoarthritis of right shoulder 01/11/2023   Glaucoma 01/11/2023   Gastroesophageal reflux disease 01/11/2023   History of shoulder surgery 01/11/2023   Facial neuralgia 01/11/2023   Pain in right shoulder 07/16/2022   Mild cognitive impairment 02/08/2022   Mild late onset Alzheimer's dementia (HCC) 11/11/2021   Vitamin D deficiency 09/21/2020   Hypokalemia 08/05/2020   History of total bilateral knee replacement 06/20/2018   Presence of unspecified artificial shoulder joint 06/20/2018   Low back pain 05/20/2015   Fatigue 05/20/2015   History of CVA (cerebrovascular accident) 05/20/2015   Insomnia 05/20/2015   Morbid obesity (HCC) 05/20/2015   Positive H. pylori test 05/20/2015   Tremor 05/20/2015   Dysfunction of eustachian tube 05/20/2015   Diverticulosis 05/20/2015   Essential (primary) hypertension 07/17/2007   Alterations of sensations, late effect of cerebrovascular disease 03/11/2005   Hyperlipidemia 10/12/2003   Trigeminal neuralgia 10/12/2003    Social History   Tobacco Use   Smoking status: Never   Smokeless  tobacco: Never  Substance Use Topics   Alcohol use: No    Alcohol/week: 0.0 standard drinks of alcohol    Allergies  Allergen Reactions   Augmentin  [Amoxicillin -Pot Clavulanate] Hives   Quinapril-Hydrochlorothiazide  Other (See Comments) and Cough    Other reaction(s): Headache     Tramadol     Dizzy, headache,  nosebleed, near-syncope   Silicone Rash    Current Meds  Medication Sig   Acetaminophen  (TYLENOL  EX ST ARTHRITIS PAIN PO) Take 2 tablets by mouth daily as needed (for arthritis pain).   aspirin  81 MG tablet Take 1 tablet by mouth daily.   brimonidine (ALPHAGAN) 0.2 % ophthalmic solution SMARTSIG:In Eye(s)   carbamazepine  (TEGRETOL ) 200 MG tablet TAKE 1 TABLET TWICE DAILY   clopidogrel  (PLAVIX ) 75 MG tablet TAKE 1 TABLET EVERY DAY   diltiazem  (DILACOR XR ) 180 MG 24 hr capsule TAKE 2 CAPSULES EVERY DAY   dorzolamide (TRUSOPT) 2 % ophthalmic solution SMARTSIG:In Eye(s)   famotidine  (PEPCID ) 40 MG tablet TAKE 1 TABLET AT BEDTIME   furosemide  (LASIX ) 20 MG tablet TAKE 1 TABLET EVERY DAY   gabapentin  (NEURONTIN ) 100 MG capsule Take 1 capsule (100 mg total) by mouth 2 (two) times daily as needed.   hydrALAZINE  (APRESOLINE ) 10 MG tablet Take 1 tablet (10 mg total) by mouth daily as needed. If bp >170 and feeling poorly   HYDROcodone -acetaminophen  (NORCO/VICODIN) 5-325 MG tablet Take 1 tablet by mouth every 6 (six) hours as needed for moderate pain (pain score 4-6).   lidocaine  (XYLOCAINE ) 2 % solution    nortriptyline  (PAMELOR ) 10 MG capsule TAKE 1 TO 2 CAPSULES BY MOUTH AT BEDTIME   predniSONE  (DELTASONE ) 10 MG tablet Take 4 tablets (40 mg total) by mouth daily with breakfast for 2 days, THEN 3 tablets (30 mg total) daily with breakfast for 2 days, THEN 2 tablets (20 mg total) daily with breakfast for 2 days, THEN 1 tablet (10 mg total) daily with breakfast for 2 days.   SIMBRINZA 1-0.2 % SUSP Place 1 drop into both eyes 2 (two) times daily.   simvastatin  (ZOCOR ) 10 MG tablet TAKE 1 TABLET AT BEDTIME   triamcinolone  (KENALOG ) 0.1 % paste 1 Application.   VITAMIN D PO Take 1 capsule by mouth daily.   [DISCONTINUED] albuterol  (VENTOLIN  HFA) 108 (90 Base) MCG/ACT inhaler Inhale 1-2 puffs into the lungs every 6 (six) hours as needed for wheezing or shortness of breath.   [DISCONTINUED]  budesonide -formoterol  (SYMBICORT ) 160-4.5 MCG/ACT inhaler Inhale 2 puffs into the lungs 2 (two) times daily.    Immunization History  Administered Date(s) Administered    sv, Bivalent, Protein Subunit Rsvpref,pf (Abrysvo) 07/25/2023   Fluad Quad(high Dose 65+) 06/27/2020, 07/17/2021, 06/18/2022, 06/13/2023   INFLUENZA, HIGH DOSE SEASONAL PF 07/12/2016, 06/16/2017, 06/21/2018, 06/22/2019, 07/14/2022, 06/13/2023   Influenza-Unspecified 05/12/2015   Moderna Covid-19 Vaccine Bivalent Booster 75yrs & up 05/11/2021   Moderna SARS-COV2 Booster Vaccination 08/07/2020, 05/11/2021   Moderna Sars-Covid-2 Vaccination 11/24/2019, 12/24/2019, 08/07/2020   Pfizer(Comirnaty)Fall Seasonal Vaccine 12 years and older 07/14/2022, 06/13/2023   Pneumococcal Conjugate-13 12/05/2014   Pneumococcal Polysaccharide-23 05/09/2013   Tdap 08/12/2011, 07/25/2023   Zoster Recombinant(Shingrix) 07/20/2021, 09/25/2021        Objective:     BP 128/80   Pulse 92   Temp 97.6 F (36.4 C) (Temporal)   Ht 5' (1.524 m)   Wt 206 lb 6.4 oz (93.6 kg)   SpO2 96%   BMI 40.31 kg/m   SpO2: 96 %  GENERAL: Obese woman, no acute  distress, ambulatory with assistance of a walker.  No conversational dyspnea. HEAD: Normocephalic, atraumatic.  EYES: Pupils equal, round, reactive to light.  No scleral icterus.  MOUTH: Poor dentition, missing upper teeth.  Oral mucosa moist moist.  No thrush. NECK: Supple. No thyromegaly. Trachea midline. No JVD.  No adenopathy. PULMONARY: Good air entry bilaterally.  No adventitious sounds. CARDIOVASCULAR: S1 and S2. Regular rate and rhythm.  No rubs, murmurs or gallops heard. ABDOMEN: Obese abdomen, otherwise benign. MUSCULOSKELETAL: No joint deformity, no clubbing, no edema.  NEUROLOGIC: No overt focal deficit, gait assisted by walker, speech is fluent. SKIN: Intact,warm,dry. PSYCH: Mood and behavior normal.       Assessment & Plan:     ICD-10-CM   1. Moderate persistent asthma  without complication  J45.40     2. History of CVA (cerebrovascular accident)  Z8.73      Meds ordered this encounter  Medications   albuterol  (VENTOLIN  HFA) 108 (90 Base) MCG/ACT inhaler    Sig: Inhale 1-2 puffs into the lungs every 6 (six) hours as needed for wheezing or shortness of breath.    Dispense:  8 g    Refill:  6   SYMBICORT  160-4.5 MCG/ACT inhaler    Sig: Inhale 2 puffs into the lungs 2 (two) times daily.    Dispense:  10.2 each    Refill:  12   Discussion:    Moderate persistent asthma Asthma is well-controlled with current medication regimen. No recent exacerbations or need for rescue inhaler. Last chest x-ray in September was normal. - Continue Symbicort  twice daily. - Ensure refills for inhalers are available. - Advise to rinse mouth after using inhaler. - Schedule follow-up appointment in six months.      Advised if symptoms do not improve or worsen, to please contact office for sooner follow up or seek emergency care.    I spent 30 minutes of dedicated to the care of this patient on the date of this encounter to include pre-visit review of records, face-to-face time with the patient discussing conditions above, post visit ordering of testing, clinical documentation with the electronic health record, making appropriate referrals as documented, and communicating necessary findings to members of the patients care team.     C. Leita Sanders, MD Advanced Bronchoscopy PCCM [facility]    *This note was generated using voice recognition software/Dragon and/or AI transcription program.  Despite best efforts to proofread, errors can occur which can change the meaning. Any transcriptional errors that result from this process are unintentional and may not be fully corrected at the time of dictation.

## 2024-07-24 NOTE — Patient Instructions (Signed)
 VISIT SUMMARY:  Today, we reviewed your asthma management. Your breathing has been stable, and you have not needed your rescue inhaler recently. You are using Symbicort  twice daily and rinsing your mouth after each use. You mentioned experiencing some swelling, but your blood pressure was noted as 120. Your last chest x-ray in September was normal, and you are up to date with your vaccinations.  YOUR PLAN:  -MODERATE PERSISTENT ASTHMA: Moderate persistent asthma is a type of asthma that requires daily medication to control symptoms and prevent flare-ups. Your asthma is well-controlled with your current medication, Symbicort , which you should continue using twice daily. Make sure to rinse your mouth after each use to prevent any side effects. We will ensure that you have refills available for your inhalers. Please schedule a follow-up appointment in six months.  INSTRUCTIONS:  Please schedule a follow-up appointment in six months.

## 2024-07-25 ENCOUNTER — Encounter: Payer: Self-pay | Admitting: Physician Assistant

## 2024-07-25 ENCOUNTER — Ambulatory Visit (INDEPENDENT_AMBULATORY_CARE_PROVIDER_SITE_OTHER): Admitting: Physician Assistant

## 2024-07-25 VITALS — BP 134/82 | HR 87 | Temp 97.1°F | Ht 60.0 in | Wt 206.6 lb

## 2024-07-25 DIAGNOSIS — M5431 Sciatica, right side: Secondary | ICD-10-CM | POA: Diagnosis not present

## 2024-07-25 DIAGNOSIS — J4540 Moderate persistent asthma, uncomplicated: Secondary | ICD-10-CM

## 2024-07-25 DIAGNOSIS — E782 Mixed hyperlipidemia: Secondary | ICD-10-CM | POA: Diagnosis not present

## 2024-07-25 DIAGNOSIS — I10 Essential (primary) hypertension: Secondary | ICD-10-CM | POA: Diagnosis not present

## 2024-07-25 DIAGNOSIS — Z8673 Personal history of transient ischemic attack (TIA), and cerebral infarction without residual deficits: Secondary | ICD-10-CM

## 2024-07-25 NOTE — Progress Notes (Signed)
 Cardiology Office Note   Date:  08/01/2024  ID:  Amy Ray, DOB 11-Sep-1946, MRN [id number] PCP: Allwardt, Mardy HERO, PA-C  [facility] Providers Cardiologist:  Oneil Parchment, MD     History of Present Illness Amy Ray is a 78 y.o. female with a past medical history of hypertension, asthma, GERD, Alzheimer's dementia, trigeminal neuralgia, history of stroke, dyslipidemia, obesity.  06/09/2023 echo EF 55 to 60%, grade 1 DD, trivial MR, mild calcification of aortic valve with moderate aortic regurgitation 05/10/2023 monitor rare atrial tachycardia PACs, average heart rate 84 bpm predominant rhythm of sinus  Previously followed by [facility] clinic Dr. Florencio, it appears for the management of her hypertension, last seen their practice in 2020.  She then established care with Dr. Parchment in June 2024 for evaluation of palpitations at the behest of her PCP, a monitor was arranged revealing atrial tachycardia, PACs, no episodes of atrial fibrillation.  An echo was completed the same year revealing a preserved EF, trivial MR, mild calcification of aortic valve with moderate aortic regurgitation.  She presents today accompanied by her daughter for follow-up, she has been doing well since she was last evaluated in our office and questions why she is following up with [facility].  We reviewed the establishment initially with Dr. Florencio for hypertension and then Dr. Parchment for palpitations--which she does not notice anymore. She denies chest pain, palpitations, dyspnea, pnd, orthopnea, n, v, dizziness, syncope, edema, weight gain, or early satiety.    ROS: Review of Systems  All other systems reviewed and are negative.    Studies Reviewed      Cardiac Studies & Procedures   ______________________________________________________________________________________________     ECHOCARDIOGRAM  ECHOCARDIOGRAM COMPLETE 06/09/2023  Narrative ECHOCARDIOGRAM REPORT    Patient Name:    Amy Ray Date of Exam: 06/09/2023 Medical Rec #:  [id number]     Height:       60.0 in Accession #:    [id number]    Weight:       213.6 lb Date of Birth:  01/06/1946     BSA:          1.920 m Patient Age:    77 years      BP:           151/69 mmHg Patient Gender: F             HR:           86 bpm. Exam Location:  Outpatient  Procedure: 2D Echo, 3D Echo, Color Doppler, Cardiac Doppler and Strain Analysis  Indications:    Dyspnea  History:        Patient has no prior history of Echocardiogram examinations. Stroke; Risk Factors:Hypertension and Non-Smoker.  Sonographer:    Orvil Holmes RDCS Referring Phys: [id number] MARDY HERO JOOTJMIU   Sonographer Comments: Image acquisition challenging due to patient body habitus and restricted mobility. IMPRESSIONS   1. Left ventricular ejection fraction, by estimation, is 55 to 60%. Left ventricular ejection fraction by 3D volume is 56 %. The left ventricle has normal function. The left ventricle has no regional wall motion abnormalities. Left ventricular diastolic parameters are consistent with Grade I diastolic dysfunction (impaired relaxation). 2. Right ventricular systolic function is normal. The right ventricular size is normal. 3. The mitral valve is normal in structure. Trivial mitral valve regurgitation. No evidence of mitral stenosis. 4. The aortic valve is normal in structure. There is mild calcification of the aortic valve. Aortic valve regurgitation is moderate. No aortic  stenosis is present. 5. The inferior vena cava is normal in size with greater than 50% respiratory variability, suggesting right atrial pressure of 3 mmHg.  FINDINGS Left Ventricle: Left ventricular ejection fraction, by estimation, is 55 to 60%. Left ventricular ejection fraction by 3D volume is 56 %. The left ventricle has normal function. The left ventricle has no regional wall motion abnormalities. The left ventricular internal cavity size was normal in size.  There is no left ventricular hypertrophy. Left ventricular diastolic parameters are consistent with Grade I diastolic dysfunction (impaired relaxation).  Right Ventricle: The right ventricular size is normal. No increase in right ventricular wall thickness. Right ventricular systolic function is normal.  Left Atrium: Left atrial size was normal in size.  Right Atrium: Right atrial size was normal in size.  Pericardium: There is no evidence of pericardial effusion.  Mitral Valve: The mitral valve is normal in structure. Trivial mitral valve regurgitation. No evidence of mitral valve stenosis.  Tricuspid Valve: The tricuspid valve is normal in structure. Tricuspid valve regurgitation is not demonstrated. No evidence of tricuspid stenosis.  Aortic Valve: The aortic valve is normal in structure. There is mild calcification of the aortic valve. Aortic valve regurgitation is moderate. Aortic regurgitation PHT measures 238 msec. No aortic stenosis is present.  Pulmonic Valve: The pulmonic valve was normal in structure. Pulmonic valve regurgitation is not visualized. No evidence of pulmonic stenosis.  Aorta: The aortic root is normal in size and structure.  Venous: The inferior vena cava is normal in size with greater than 50% respiratory variability, suggesting right atrial pressure of 3 mmHg.  IAS/Shunts: No atrial level shunt detected by color flow Doppler.   LEFT VENTRICLE PLAX 2D LVIDd:         3.73 cm         Diastology LVIDs:         2.40 cm         LV e' medial:    8.16 cm/s LV PW:         0.83 cm         LV E/e' medial:  7.1 LV IVS:        0.89 cm         LV e' lateral:   10.30 cm/s LVOT diam:     2.20 cm         LV E/e' lateral: 5.7 LV SV:         40 LV SV Index:   21 LVOT Area:     3.80 cm        3D Volume EF LV 3D EF:    Left ventricul ar ejection fraction by 3D volume is 56 %.  3D Volume EF: 3D EF:        56 % LV EDV:       143 ml LV ESV:       63 ml LV SV:         80 ml  RIGHT VENTRICLE RV S prime:     14.50 cm/s TAPSE (M-mode): 2.4 cm  LEFT ATRIUM           Index        RIGHT ATRIUM          Index LA diam:      2.70 cm 1.41 cm/m   RA Area:     5.58 cm LA Vol (A2C): 18.3 ml 9.53 ml/m   RA Volume:   9.66 ml  5.03 ml/m LA Vol (A4C):  24.5 ml 12.76 ml/m AORTIC VALVE LVOT Vmax:   68.30 cm/s LVOT Vmean:  45.000 cm/s LVOT VTI:    0.106 m AI PHT:      238 msec  AORTA Ao Root diam: 3.30 cm Ao Asc diam:  3.40 cm  MITRAL VALVE MV Area (PHT): 5.13 cm    SHUNTS MV Decel Time: 148 msec    Systemic VTI:  0.11 m MV E velocity: 58.20 cm/s  Systemic Diam: 2.20 cm MV A velocity: 98.50 cm/s MV E/A ratio:  0.59  Oneil Parchment MD Electronically signed by Oneil Parchment MD Signature Date/Time: 06/09/2023/4:33:38 PM    Final    MONITORS  LONG TERM MONITOR (3-14 DAYS) 05/05/2023  Narrative   Rare atrial tachycardia - benign   Rare PAC   No atrial fibrillation   Symptoms reported were associated with normal sinus rhythm - reassuring   Patch Wear Time:  12 days and 21 hours (2024-07-08T19:00:33-0400 to 2024-07-21T16:18:11-398)  Patient had a min HR of 58 bpm, max HR of 169 bpm, and avg HR of 84 bpm. Predominant underlying rhythm was Sinus Rhythm. Slight P wave morphology changes were noted. 956 Supraventricular Tachycardia runs occurred, the run with the fastest interval lasting 6 beats with a max rate of 169 bpm, the longest lasting 42.9 secs with an avg rate of 106 bpm. Isolated SVEs were rare (<1.0%), SVE Couplets were rare (<1.0%), and SVE Triplets were rare (<1.0%). Isolated VEs were rare (<1.0%, 145), and no VE Couplets or VE Triplets were present.       ______________________________________________________________________________________________      Risk Assessment/Calculations           Physical Exam VS:  BP 130/88 (Cuff Size: Normal)   Pulse 92   Ht 5' (1.524 m)   Wt 205 lb (93 kg)   SpO2 97%   BMI 40.04 kg/m        Wt  Readings from Last 3 Encounters:  08/01/24 205 lb (93 kg)  07/25/24 206 lb 9.6 oz (93.7 kg)  07/24/24 206 lb 6.4 oz (93.6 kg)    GEN: Well nourished, well developed in no acute distress NECK: No JVD; No carotid bruits CARDIAC: RRR, no murmurs, rubs, gallops RESPIRATORY:  Clear to auscultation without rales, wheezing or rhonchi  ABDOMEN: Soft, non-tender, non-distended EXTREMITIES:  No edema; No deformity   ASSESSMENT AND PLAN Palpitations -currently quiescent, continue diltiazem  360 mg daily.  Hypertension-blood pressure is controlled at 130/88, continue hydralazine  as needed for systolic greater than 170, diltiazem  360 mg daily.  Dyslipidemia-most recent LDL was well-controlled at 69, this appears to be monitored by her PCP, continue Zocor  10 mg daily.  History of stroke-on aspirin  and Plavix -- no apparent adverse bleeding effects.       Dispo:  Follow up as needed per patient request.   Signed, Delon JAYSON Hoover, NP

## 2024-07-25 NOTE — Progress Notes (Signed)
 Patient ID: Amy Ray, female    DOB: 22-Nov-1945, 78 y.o.   MRN: [id number]   Assessment & Plan:  Essential hypertension  Right sided sciatica  Moderate persistent asthma without complication      Assessment and Plan Assessment & Plan Right-sided sciatica Recent episode led to an ER visit on July 19, 2024. Symptoms have improved since discharge with a prednisone  taper.  Moderate persistent asthma Asthma is currently well-controlled with daily use of Symbicort  and as-needed use of albuterol . No recent exacerbations reported. - Continue Symbicort  daily - Use albuterol  as needed for acute symptoms  Hypertension Blood pressure is currently well-controlled. Diltiazem  daily. Previous issues with elevated blood pressure were managed with hydralazine  as needed for systolic readings over 170 mmHg. No recent need for hydralazine  reported. - Follow up with cardiology on August 01, 2024  HLD -Stable on Zocor  at bedtime   Hx of CVA March 16, 2005, left sided facial neuralgia persistent. Carbamazepine  and Nortriptyline  helps with this a lot says the patient. She tried to come off these medications once and says the nerve pain was much worse. Takes ASA 81 mg and Plavix  75 mg daily.      F/up with Dr. Mercer, [facility]     Subjective:    Chief Complaint  Patient presents with   Medical Management of Chronic Issues    Pt in office for 6 mon f/u;     HPI Discussed the use of AI scribe software for clinical note transcription with the patient, who gave verbal consent to proceed.  History of Present Illness Amy Ray is a 78 year old female who presents for a six month follow-up visit. She is accompanied by her daughter.  She was seen at [facility] yesterday for moderate persistent asthma. She uses a Symbicort  inhaler daily and has an albuterol  rescue inhaler. Her lungs feel 'pretty good right now.'  She had an ER visit on July 19, 2024, for right-sided sciatica.  She was discharged with a prednisone  taper and is feeling better now.  She has a history of blood pressure issues and was prescribed hydralazine  to take if her systolic blood pressure exceeds 170 mmHg and she feels poorly. She has not needed to take hydralazine .  She is scheduled for a cardiology follow-up on August 01, 2024, due to a previous abnormal EKG when her blood pressure was elevated. She has not experienced any swelling in her legs recently, attributing this to dietary changes.     Past Medical History:  Diagnosis Date   Alzheimers disease (HCC)    Anticoagulant long-term use    plavix --- manged by pcp   Asthma    Chronic constipation    Diverticulosis of colon    Generalized abdominal pain    Glaucoma, both eyes    Hemorrhoids    History of CVA with residual deficit 03/16/2005   left facial paresthesia;  per pt note from stroke but in neurologist note is cva residual   History of diverticulitis of colon 05/20/2015   History of lower GI bleeding    Hyperlipidemia, mixed    Hypertension    per previous had nuclear stress test w/ cardiologist w/ dr florencio , results in care everywhere , NUC 02-01-2017  normal , ef 60%;  echo 02-02-2017 ef 50% mild AR/TR; and normal stress echo   MCI (mild cognitive impairment)    neurology--- camie sevin PA ([facility]);  per note seconardy to vascular and alzeimer disease;  per MRI moderate  to extensive CSV ischemia   Nocturia    OA (osteoarthritis)    PMB (postmenopausal bleeding)    Thickened endometrium    Trigeminal neuralgia of left side of face    per pt started after stroke   Vertigo    Wears partial dentures    upper    Past Surgical History:  Procedure Laterality Date   CATARACT EXTRACTION W/ INTRAOCULAR LENS IMPLANT Bilateral    right 2023;  left , yrs ago   CHOLECYSTECTOMY     CHOLECYSTECTOMY, LAPAROSCOPIC  2000   HYSTEROSCOPY WITH D & C N/A 12/20/2022   Procedure: DILATATION AND CURETTAGE;  Surgeon: Lequita Evalene LABOR, MD;  Location: [facility];  Service: Gynecology;  Laterality: N/A;   REVERSE SHOULDER ARTHROPLASTY Bilateral    right 06-25-2022 in [place];   approx.  2017  left side in [place]   TOTAL KNEE ARTHROPLASTY Bilateral    2004;  2005   UMBILICAL HERNIA REPAIR     child    Family History  Problem Relation Age of Onset   Stroke Mother    Obesity Mother    Hypertension Mother    Alzheimer's disease Father    Diabetes Father    Hypertension Sister    Hypertension Sister    Hypertension Sister    Hypertension Sister    Hypertension Sister    Breast cancer Sister 50   Diabetes Brother    Breast cancer Maternal Aunt 35   Breast cancer Maternal Aunt     Social History   Tobacco Use   Smoking status: Never   Smokeless tobacco: Never  Vaping Use   Vaping status: Never Used  Substance Use Topics   Alcohol use: No    Alcohol/week: 0.0 standard drinks of alcohol   Drug use: Never     Allergies  Allergen Reactions   Augmentin  [Amoxicillin -Pot Clavulanate] Hives   Quinapril-Hydrochlorothiazide  Other (See Comments) and Cough    Other reaction(s): Headache     Tramadol     Dizzy, headache, nosebleed, near-syncope   Silicone Rash    Review of Systems NEGATIVE UNLESS OTHERWISE INDICATED IN HPI      Objective:     BP 134/82 (BP Location: Right Arm, Patient Position: Sitting, Cuff Size: Normal)   Pulse 87   Temp (!) 97.1 F (36.2 C) (Temporal)   Ht 5' (1.524 m)   Wt 206 lb 9.6 oz (93.7 kg)   SpO2 97%   BMI 40.35 kg/m   Wt Readings from Last 3 Encounters:  07/25/24 206 lb 9.6 oz (93.7 kg)  07/24/24 206 lb 6.4 oz (93.6 kg)  06/18/24 208 lb 6 oz (94.5 kg)    BP Readings from Last 3 Encounters:  07/25/24 134/82  07/24/24 128/80  07/19/24 136/73     Physical Exam Vitals and nursing note reviewed.  Constitutional:      Appearance: Normal appearance. She is obese.     Comments: Rollator walker   HENT:     Nose: Nose normal.      Mouth/Throat:     Mouth: Mucous membranes are moist.  Eyes:     Extraocular Movements: Extraocular movements intact.     Conjunctiva/sclera: Conjunctivae normal.     Pupils: Pupils are equal, round, and reactive to light.  Cardiovascular:     Rate and Rhythm: Normal rate and regular rhythm.     Pulses: Normal pulses.     Heart sounds: No murmur heard. Pulmonary:     Effort:  Pulmonary effort is normal.     Breath sounds: Normal breath sounds. No wheezing or rhonchi.  Musculoskeletal:     Right lower leg: No edema.     Left lower leg: No edema.  Skin:    Findings: No rash.  Neurological:     Mental Status: She is alert and oriented to person, place, and time. Mental status is at baseline.  Psychiatric:        Mood and Affect: Mood normal.        Behavior: Behavior normal.             Mayola Mcbain M Jacquis Paxton, PA-C

## 2024-07-25 NOTE — Patient Instructions (Signed)
  VISIT SUMMARY: Amy Ray came in for her six-month follow-up visit. Her asthma is well-controlled, her sciatica has improved, and her blood pressure is stable.  YOUR PLAN: RIGHT-SIDED SCIATICA: You had a recent episode of sciatica that led to an ER visit. Your symptoms have improved since discharge with a prednisone  taper. -Continue to monitor your symptoms and follow up if they worsen.  MODERATE PERSISTENT ASTHMA: Your asthma is currently well-controlled with daily use of Symbicort  and as-needed use of albuterol . -Continue using Symbicort  daily. -Use albuterol  as needed for acute symptoms.  HYPERTENSION: Your blood pressure is currently well-controlled. You have hydralazine  to use if your systolic blood pressure exceeds 170 mmHg and you feel poorly, but you have not needed it recently. -Follow up with cardiology on August 01, 2024.  GENERAL HEALTH MAINTENANCE: You have an upcoming colonoscopy scheduled. -Your colonoscopy is scheduled for April 05, 2025.                      Contains text generated by Abridge.                                 Contains text generated by Abridge.

## 2024-08-01 ENCOUNTER — Encounter: Payer: Self-pay | Admitting: Cardiology

## 2024-08-01 ENCOUNTER — Ambulatory Visit: Attending: Cardiology | Admitting: Cardiology

## 2024-08-01 VITALS — BP 130/88 | HR 92 | Ht 60.0 in | Wt 205.0 lb

## 2024-08-01 DIAGNOSIS — E7800 Pure hypercholesterolemia, unspecified: Secondary | ICD-10-CM | POA: Diagnosis not present

## 2024-08-01 DIAGNOSIS — Z8673 Personal history of transient ischemic attack (TIA), and cerebral infarction without residual deficits: Secondary | ICD-10-CM | POA: Diagnosis not present

## 2024-08-01 DIAGNOSIS — R002 Palpitations: Secondary | ICD-10-CM | POA: Diagnosis not present

## 2024-08-01 DIAGNOSIS — I10 Essential (primary) hypertension: Secondary | ICD-10-CM

## 2024-08-01 NOTE — Patient Instructions (Signed)
 Medication Instructions:  Your physician recommends that you continue on your current medications as directed. Please refer to the Current Medication list given to you today.  *If you need a refill on your cardiac medications before your next appointment, please call your pharmacy*  Lab Work: NONE ordered at this time of appointment   Testing/Procedures: NONE ordered at this time of appointment   Follow-Up: At [facility], you and your health needs are our priority.  As part of our continuing mission to provide you with exceptional heart care, our providers are all part of one team.  This team includes your primary Cardiologist (physician) and Advanced Practice Providers or APPs (Physician Assistants and Nurse Practitioners) who all work together to provide you with the care you need, when you need it.  Your next appointment:    Follow up as needed  Provider:   Oneil Parchment, MD    We recommend signing up for the patient portal called MyChart.  Sign up information is provided on this After Visit Summary.  MyChart is used to connect with patients for Virtual Visits (Telemedicine).  Patients are able to view lab/test results, encounter notes, upcoming appointments, etc.  Non-urgent messages can be sent to your provider as well.   To learn more about what you can do with MyChart, go to [email or web address].

## 2024-08-13 ENCOUNTER — Encounter: Payer: Self-pay | Admitting: Radiology

## 2024-08-15 DIAGNOSIS — H401111 Primary open-angle glaucoma, right eye, mild stage: Secondary | ICD-10-CM | POA: Diagnosis not present

## 2024-08-15 DIAGNOSIS — H4062X0 Glaucoma secondary to drugs, left eye, stage unspecified: Secondary | ICD-10-CM | POA: Diagnosis not present

## 2024-08-15 DIAGNOSIS — H04123 Dry eye syndrome of bilateral lacrimal glands: Secondary | ICD-10-CM | POA: Diagnosis not present

## 2024-08-15 DIAGNOSIS — H524 Presbyopia: Secondary | ICD-10-CM | POA: Diagnosis not present

## 2024-08-15 DIAGNOSIS — H532 Diplopia: Secondary | ICD-10-CM | POA: Diagnosis not present

## 2024-08-15 LAB — OPHTHALMOLOGY REPORT-SCANNED

## 2024-09-14 ENCOUNTER — Ambulatory Visit: Admitting: Family Medicine

## 2024-09-14 ENCOUNTER — Encounter: Admitting: Family Medicine

## 2024-09-14 ENCOUNTER — Encounter: Payer: Self-pay | Admitting: Family Medicine

## 2024-09-14 VITALS — BP 152/90 | HR 90 | Temp 98.5°F | Ht 60.0 in | Wt 216.6 lb

## 2024-09-14 DIAGNOSIS — R051 Acute cough: Secondary | ICD-10-CM | POA: Diagnosis not present

## 2024-09-14 DIAGNOSIS — Z8673 Personal history of transient ischemic attack (TIA), and cerebral infarction without residual deficits: Secondary | ICD-10-CM | POA: Diagnosis not present

## 2024-09-14 DIAGNOSIS — Z7689 Persons encountering health services in other specified circumstances: Secondary | ICD-10-CM

## 2024-09-14 DIAGNOSIS — I10 Essential (primary) hypertension: Secondary | ICD-10-CM | POA: Diagnosis not present

## 2024-09-14 DIAGNOSIS — J4540 Moderate persistent asthma, uncomplicated: Secondary | ICD-10-CM

## 2024-09-14 DIAGNOSIS — Z96653 Presence of artificial knee joint, bilateral: Secondary | ICD-10-CM | POA: Diagnosis not present

## 2024-09-14 NOTE — Progress Notes (Signed)
 "  Established Patient Office Visit   Subjective  Patient ID: Amy Ray, female    DOB: December 24, 1945  Age: 78 y.o. MRN: [id number]  Chief Complaint  Patient presents with   Establish Care    Patient has a cough and cold for 2 weeks.  Pt accompanied by her daughter.  Pt is a 78 yo female seen for TOC and f/u on chronic conditions.  Previously seen by Mardy Dyke, PA.  Pt notes h/o asthma.  Has albuterol  inhaler and symbicort . Has questions on when to take albuterol .  Elevated bp readings.  Typically 144-145/80s.  Taking diltiazem  xr 180 mg.  Had rx for hydralazine  10 mg prn for sys >170.  On plavix   and ASA 81 mg for h/o CVA June 2006.  H/o TKR.  Pt notes a productive cough that started last wk.  Some wheezing.  Not having other symptoms. Taking Robitussin honey.      Patient Active Problem List   Diagnosis Date Noted   Moderate persistent asthma without complication 07/25/2024   Mild intermittent asthma without complication 01/06/2024   Chronic pain of both knees 01/21/2023   Bilateral lower extremity edema 01/21/2023   History of reverse total replacement of right shoulder joint 01/11/2023   Osteoarthritis of right shoulder 01/11/2023   Glaucoma 01/11/2023   Gastroesophageal reflux disease 01/11/2023   History of shoulder surgery 01/11/2023   Facial neuralgia 01/11/2023   Pain in right shoulder 07/16/2022   Mild cognitive impairment 02/08/2022   Mild late onset Alzheimer's dementia (HCC) 11/11/2021   Vitamin D deficiency 09/21/2020   Hypokalemia 08/05/2020   History of total bilateral knee replacement 06/20/2018   Presence of unspecified artificial shoulder joint 06/20/2018   Low back pain 05/20/2015   Fatigue 05/20/2015   History of CVA (cerebrovascular accident) 05/20/2015   Insomnia 05/20/2015   Morbid obesity (HCC) 05/20/2015   Positive H. pylori test 05/20/2015   Tremor 05/20/2015   Dysfunction of eustachian tube 05/20/2015   Diverticulosis 05/20/2015    Essential (primary) hypertension 07/17/2007   Alterations of sensations, late effect of cerebrovascular disease 03/11/2005   Hyperlipidemia 10/12/2003   Trigeminal neuralgia 10/12/2003   Past Medical History:  Diagnosis Date   Alzheimers disease (HCC)    Anticoagulant long-term use    plavix --- manged by pcp   Asthma    Chronic constipation    Diverticulosis of colon    Generalized abdominal pain    Glaucoma, both eyes    Hemorrhoids    History of CVA with residual deficit 03/16/2005   left facial paresthesia;  per pt note from stroke but in neurologist note is cva residual   History of diverticulitis of colon 05/20/2015   History of lower GI bleeding    Hyperlipidemia, mixed    Hypertension    per previous had nuclear stress test w/ cardiologist w/ dr florencio , results in care everywhere , NUC 02-01-2017  normal , ef 60%;  echo 02-02-2017 ef 50% mild AR/TR; and normal stress echo   MCI (mild cognitive impairment)    neurology--- camie sevin PA ([facility]);  per note seconardy to vascular and alzeimer disease;  per MRI moderate to extensive CSV ischemia   Nocturia    OA (osteoarthritis)    PMB (postmenopausal bleeding)    Thickened endometrium    Trigeminal neuralgia of left side of face    per pt started after stroke   Vertigo    Wears partial dentures    upper   Past  Surgical History:  Procedure Laterality Date   CATARACT EXTRACTION W/ INTRAOCULAR LENS IMPLANT Bilateral    right 2023;  left , yrs ago   CHOLECYSTECTOMY     CHOLECYSTECTOMY, LAPAROSCOPIC  2000   HYSTEROSCOPY WITH D & C N/A 12/20/2022   Procedure: DILATATION AND CURETTAGE;  Surgeon: Lequita Evalene LABOR, MD;  Location: [facility] [place];  Service: Gynecology;  Laterality: N/A;   REVERSE SHOULDER ARTHROPLASTY Bilateral    right 06-25-2022 in [place];   approx.  2017  left side in [place]   TOTAL KNEE ARTHROPLASTY Bilateral    2004;  2005   UMBILICAL HERNIA REPAIR     child   Social History    Tobacco Use   Smoking status: Never   Smokeless tobacco: Never  Vaping Use   Vaping status: Never Used  Substance Use Topics   Alcohol use: No    Alcohol/week: 0.0 standard drinks of alcohol   Drug use: Never   Family History  Problem Relation Age of Onset   Stroke Mother    Obesity Mother    Hypertension Mother    Alzheimer's disease Father    Diabetes Father    Hypertension Sister    Hypertension Sister    Hypertension Sister    Hypertension Sister    Hypertension Sister    Breast cancer Sister 20   Diabetes Brother    Breast cancer Maternal Aunt 76   Breast cancer Maternal Aunt    Allergies  Allergen Reactions   Augmentin  [Amoxicillin -Pot Clavulanate] Hives   Quinapril-Hydrochlorothiazide  Other (See Comments) and Cough    Other reaction(s): Headache     Tramadol     Dizzy, headache, nosebleed, near-syncope   Silicone Rash    ROS Negative unless stated above    Objective:     BP (!) 152/90 (BP Location: Right Arm, Patient Position: Sitting, Cuff Size: Large)   Pulse 90   Temp 98.5 F (36.9 C) (Oral)   Ht 5' (1.524 m)   Wt 216 lb 9.6 oz (98.2 kg)   SpO2 99%   BMI 42.30 kg/m  BP Readings from Last 3 Encounters:  09/14/24 (!) 152/90  08/01/24 130/88  07/25/24 134/82   Wt Readings from Last 3 Encounters:  09/14/24 216 lb 9.6 oz (98.2 kg)  08/01/24 205 lb (93 kg)  07/25/24 206 lb 9.6 oz (93.7 kg)      Physical Exam Constitutional:      General: She is not in acute distress.    Appearance: Normal appearance.  HENT:     Head: Normocephalic and atraumatic.     Nose: Nose normal.     Mouth/Throat:     Mouth: Mucous membranes are moist.  Cardiovascular:     Rate and Rhythm: Normal rate and regular rhythm.     Heart sounds: Normal heart sounds. No murmur heard.    No gallop.  Pulmonary:     Effort: Pulmonary effort is normal. No respiratory distress.     Breath sounds: Normal breath sounds. No decreased breath sounds, wheezing, rhonchi or  rales.  Skin:    General: Skin is warm and dry.  Neurological:     Mental Status: She is alert and oriented to person, place, and time.        09/14/2024    3:47 PM 11/09/2023    2:47 PM 10/14/2023   11:15 AM  Depression screen PHQ 2/9  Decreased Interest 0 0 0  Down, Depressed, Hopeless 0 0 0  PHQ -  2 Score 0 0 0  Altered sleeping 0 0 0  Tired, decreased energy 0 0 0  Change in appetite 0 0 0  Feeling bad or failure about yourself  0 0 0  Trouble concentrating 2 0 0  Moving slowly or fidgety/restless 0 0 0  Suicidal thoughts 0 0 0  PHQ-9 Score 2 0  0   Difficult doing work/chores  Not difficult at all Not difficult at all     Data saved with a previous flowsheet row definition      09/14/2024    3:47 PM 10/14/2023   11:15 AM 05/11/2023   10:18 AM  GAD 7 : Generalized Anxiety Score  Nervous, Anxious, on Edge 2 0 0  Control/stop worrying 0 0 0  Worry too much - different things  0 0  Trouble relaxing 0 0 0  Restless 0 0 0  Easily annoyed or irritable 0 0 0  Afraid - awful might happen 0 0 0  Total GAD 7 Score  0 0  Anxiety Difficulty  Not difficult at all Not difficult at all     No results found for any visits on 09/14/24.    Assessment & Plan:   Acute cough  Essential hypertension  History of CVA (cerebrovascular accident)  Moderate persistent asthma without complication  Status post total bilateral knee replacement  Acute cough and wheezing.  Concern for viral etiology causing asthma exacerbation.  Discussed supportive care and proper timing/use of inhalers.  Continue Singulair 2 puffs bid.  Albuterol  prn.  Consider antihistamine for possible allergic component.  BP elevated.  Likely multifactorial including white coat htn as meeting a new provider, coughing, and use of OTC cough meds.  Lifestyle modifications strongly encouraged.  Monitor bp at home.  For continued elevation adjust meds.  Continue xarelto, asa 81 mg, and simvastatin  10 mg daily.  Return in  about 4 months (around 01/13/2025), or if symptoms worsen or fail to improve, for chronic conditions.   Clotilda JONELLE Single, MD "

## 2024-09-24 ENCOUNTER — Other Ambulatory Visit: Payer: Self-pay | Admitting: Physician Assistant

## 2024-10-13 ENCOUNTER — Emergency Department (HOSPITAL_BASED_OUTPATIENT_CLINIC_OR_DEPARTMENT_OTHER)
Admission: EM | Admit: 2024-10-13 | Discharge: 2024-10-14 | Disposition: A | Attending: Emergency Medicine | Admitting: Emergency Medicine

## 2024-10-13 ENCOUNTER — Other Ambulatory Visit: Payer: Self-pay

## 2024-10-13 ENCOUNTER — Encounter (HOSPITAL_BASED_OUTPATIENT_CLINIC_OR_DEPARTMENT_OTHER): Payer: Self-pay

## 2024-10-13 ENCOUNTER — Emergency Department (HOSPITAL_BASED_OUTPATIENT_CLINIC_OR_DEPARTMENT_OTHER)

## 2024-10-13 DIAGNOSIS — Z7902 Long term (current) use of antithrombotics/antiplatelets: Secondary | ICD-10-CM | POA: Insufficient documentation

## 2024-10-13 DIAGNOSIS — Z7982 Long term (current) use of aspirin: Secondary | ICD-10-CM | POA: Insufficient documentation

## 2024-10-13 DIAGNOSIS — R42 Dizziness and giddiness: Secondary | ICD-10-CM | POA: Insufficient documentation

## 2024-10-13 DIAGNOSIS — R110 Nausea: Secondary | ICD-10-CM | POA: Insufficient documentation

## 2024-10-13 LAB — CBC WITH DIFFERENTIAL/PLATELET
Abs Immature Granulocytes: 0.03 K/uL (ref 0.00–0.07)
Basophils Absolute: 0 K/uL (ref 0.0–0.1)
Basophils Relative: 1 %
Eosinophils Absolute: 0.1 K/uL (ref 0.0–0.5)
Eosinophils Relative: 2 %
HCT: 37.3 % (ref 36.0–46.0)
Hemoglobin: 12.6 g/dL (ref 12.0–15.0)
Immature Granulocytes: 0 %
Lymphocytes Relative: 31 %
Lymphs Abs: 2.3 K/uL (ref 0.7–4.0)
MCH: 33.2 pg (ref 26.0–34.0)
MCHC: 33.8 g/dL (ref 30.0–36.0)
MCV: 98.4 fL (ref 80.0–100.0)
Monocytes Absolute: 0.9 K/uL (ref 0.1–1.0)
Monocytes Relative: 12 %
Neutro Abs: 4.2 K/uL (ref 1.7–7.7)
Neutrophils Relative %: 54 %
Platelets: 210 K/uL (ref 150–400)
RBC: 3.79 MIL/uL — ABNORMAL LOW (ref 3.87–5.11)
RDW: 12 % (ref 11.5–15.5)
WBC: 7.7 K/uL (ref 4.0–10.5)
nRBC: 0 % (ref 0.0–0.2)

## 2024-10-13 LAB — BASIC METABOLIC PANEL WITH GFR
Anion gap: 13 (ref 5–15)
BUN: 20 mg/dL (ref 8–23)
CO2: 25 mmol/L (ref 22–32)
Calcium: 9.3 mg/dL (ref 8.9–10.3)
Chloride: 101 mmol/L (ref 98–111)
Creatinine, Ser: 1.08 mg/dL — ABNORMAL HIGH (ref 0.44–1.00)
GFR, Estimated: 53 mL/min — ABNORMAL LOW
Glucose, Bld: 94 mg/dL (ref 70–99)
Potassium: 3.5 mmol/L (ref 3.5–5.1)
Sodium: 139 mmol/L (ref 135–145)

## 2024-10-13 MED ORDER — MECLIZINE HCL 25 MG PO TABS
25.0000 mg | ORAL_TABLET | Freq: Once | ORAL | Status: AC
  Administered 2024-10-13: 25 mg via ORAL
  Filled 2024-10-13: qty 1

## 2024-10-13 MED ORDER — MECLIZINE HCL 25 MG PO TABS: 25.0000 mg | ORAL_TABLET | Freq: Three times a day (TID) | ORAL | 0 refills | Status: AC | PRN

## 2024-10-13 MED ORDER — IOHEXOL 350 MG/ML SOLN: 75.0000 mL | Freq: Once | INTRAVENOUS | Status: DC | PRN

## 2024-10-13 MED ORDER — SODIUM CHLORIDE 0.9 % IV BOLUS: 1000.0000 mL | Freq: Once | INTRAVENOUS | Status: DC

## 2024-10-13 MED ORDER — ONDANSETRON HCL 4 MG/2ML IJ SOLN
4.0000 mg | Freq: Once | INTRAMUSCULAR | Status: DC
  Filled 2024-10-13: qty 2

## 2024-10-13 MED ORDER — ONDANSETRON 4 MG PO TBDP: 4.0000 mg | ORAL_TABLET | Freq: Three times a day (TID) | ORAL | 0 refills | Status: AC | PRN

## 2024-10-13 MED ORDER — ONDANSETRON 4 MG PO TBDP: 4.0000 mg | ORAL_TABLET | Freq: Once | ORAL | Status: DC

## 2024-10-13 NOTE — Discharge Instructions (Signed)
 I do suspect your dizziness is likely from vertigo.  Take meclizine  as prescribed for dizziness and Zofran  as needed for nausea and vomiting.  Follow-up with ear nose and throat Dr. Tobie and your primary care doctor.  Return if symptoms worsen.

## 2024-10-13 NOTE — ED Notes (Signed)
 ED Provider at bedside.

## 2024-10-13 NOTE — ED Provider Notes (Addendum)
 " [place] EMERGENCY DEPARTMENT AT [facility] Provider Note   CSN: [id number] Arrival date & time: 10/13/24  2036     Patient presents with: Dizziness   Amy Ray is a 79 y.o. female.   Patient here with intermittent dizziness for the last 5 or 6 days.  Associated with nausea maybe some discomfort in her left ear.  She has a history of vertigo history of stroke with no major deficits for maybe some left facial numbness at times maybe some numbness in the left side of her body.  She has history of hypertension high cholesterol memory issues.  She denies any chest pain shortness of breath weakness numbness tingling.  She has not passed out from this.  She has been able to ambulate with her walker but will feel dizzy at times with standing up or head movements.  She has not passed out.  She has not had falls.  She has had times throughout the week where she has not had any symptoms.  Symptoms seems to wax and wane.  The history is provided by the patient.       Prior to Admission medications  Medication Sig Start Date End Date Taking? Authorizing Provider  Acetaminophen  (TYLENOL  EX ST ARTHRITIS PAIN PO) Take 2 tablets by mouth daily as needed (for arthritis pain).    [provider]  albuterol  (VENTOLIN  HFA) 108 (90 Base) MCG/ACT inhaler Inhale 1-2 puffs into the lungs every 6 (six) hours as needed for wheezing or shortness of breath. 07/24/24   Tamea Dedra CROME, MD  aspirin  81 MG tablet Take 1 tablet by mouth daily.    [provider]  brimonidine (ALPHAGAN) 0.2 % ophthalmic solution SMARTSIG:In Eye(s) 03/26/24   [provider]  carbamazepine  (TEGRETOL ) 200 MG tablet TAKE 1 TABLET TWICE DAILY 09/24/24   Allwardt, Alyssa M, PA-C  clopidogrel  (PLAVIX ) 75 MG tablet TAKE 1 TABLET EVERY DAY 01/13/24   Allwardt, Alyssa M, PA-C  DILT-XR 180 MG 24 hr capsule TAKE 2 CAPSULES EVERY DAY 09/24/24   Allwardt, Alyssa M, PA-C  dorzolamide (TRUSOPT) 2 % ophthalmic  solution SMARTSIG:In Eye(s) 03/26/24   [provider]  famotidine  (PEPCID ) 40 MG tablet TAKE 1 TABLET AT BEDTIME 01/13/24   Allwardt, Alyssa M, PA-C  furosemide  (LASIX ) 20 MG tablet TAKE 1 TABLET EVERY DAY 11/10/23   Allwardt, Alyssa M, PA-C  gabapentin  (NEURONTIN ) 100 MG capsule Take 1 capsule (100 mg total) by mouth 2 (two) times daily as needed. 08/05/23   Magnant, Charles L, PA-C  hydrALAZINE  (APRESOLINE ) 10 MG tablet Take 1 tablet (10 mg total) by mouth daily as needed. If bp >170 and feeling poorly 06/18/24   Wendolyn Jenkins Jansky, MD  nortriptyline  (PAMELOR ) 10 MG capsule TAKE 1 TO 2 CAPSULES BY MOUTH AT BEDTIME 01/15/24   Allwardt, Alyssa M, PA-C  simvastatin  (ZOCOR ) 10 MG tablet TAKE 1 TABLET AT BEDTIME 11/10/23   Allwardt, Alyssa M, PA-C  SYMBICORT  160-4.5 MCG/ACT inhaler Inhale 2 puffs into the lungs 2 (two) times daily. 07/24/24   Tamea Dedra CROME, MD  VITAMIN D PO Take 1 capsule by mouth daily.    [provider]    Allergies: Augmentin  [amoxicillin -pot clavulanate], Quinapril-hydrochlorothiazide , Tramadol, and Silicone    Review of Systems  Updated Vital Signs BP (!) 152/84   Pulse 91   Temp 98.2 F (36.8 C) (Oral)   Resp 20   SpO2 99%   Physical Exam Vitals and nursing note reviewed.  Constitutional:  General: She is not in acute distress.    Appearance: She is well-developed. She is not ill-appearing.  HENT:     Head: Normocephalic and atraumatic.     Mouth/Throat:     Mouth: Mucous membranes are dry.  Eyes:     Extraocular Movements: Extraocular movements intact.     Conjunctiva/sclera: Conjunctivae normal.     Pupils: Pupils are equal, round, and reactive to light.  Cardiovascular:     Rate and Rhythm: Normal rate and regular rhythm.     Pulses: Normal pulses.     Heart sounds: Normal heart sounds. No murmur heard. Pulmonary:     Effort: Pulmonary effort is normal. No respiratory distress.     Breath sounds: Normal breath sounds.  Abdominal:      Palpations: Abdomen is soft.     Tenderness: There is no abdominal tenderness.  Musculoskeletal:        General: No swelling.     Cervical back: Normal range of motion and neck supple.  Skin:    General: Skin is warm and dry.     Capillary Refill: Capillary refill takes less than 2 seconds.  Neurological:     General: No focal deficit present.     Mental Status: She is alert and oriented to person, place, and time.     Cranial Nerves: No cranial nerve deficit.     Sensory: No sensory deficit.     Motor: No weakness.     Coordination: Coordination normal.     Comments: Normal strength sensation and coordination.  Maybe some mild horizontal nystagmus but no rotary or vertical nystagmus, normal cranial nerves, normal speech normal visual fields  Psychiatric:        Mood and Affect: Mood normal.     (all labs ordered are listed, but only abnormal results are displayed) Labs Reviewed  CBC WITH DIFFERENTIAL/PLATELET  BASIC METABOLIC PANEL WITH GFR    EKG: EKG Interpretation Date/Time:  Saturday October 13 2024 21:02:03 EST Ventricular Rate:  81 PR Interval:  152 QRS Duration:  83 QT Interval:  376 QTC Calculation: 437 R Axis:   68  Text Interpretation: Sinus rhythm Confirmed by Ruthe Cornet [phone]) on 10/13/2024 9:20:02 PM  Radiology: No results found.   Procedures   Medications Ordered in the ED  ondansetron  (ZOFRAN -ODT) disintegrating tablet 4 mg (has no administration in time range)  meclizine  (ANTIVERT ) tablet 25 mg (25 mg Oral Given 10/13/24 2153)                                    Medical Decision Making Amount and/or Complexity of Data Reviewed Labs: ordered. Radiology: ordered.  Risk Prescription drug management.   Verneta Riding patient here for some intermittent dizziness for the last 5 or 6 days.  History of vertigo feels little bit similar but worse.  She has had nausea at times with it.  Seems worse with head movement.  She denies any weakness  numbness tingling.  She has history of stroke with maybe some left-sided facial numbness at baseline but her neuroexam is normal.  She has got normal coordination and normal visual fields maybe some mild horizontal nystagmus on exam.  She is on Plavix .  She has no chest pain or shortness of breath.  Differential diagnosis likely peripheral vertigo.  Symptoms have been ongoing for several days.  She has had periods of times where she has been asymptomatic.  Overall we will give meclizine  and Zofran  check basic labs.  EKG shows sinus rhythm.  No ischemic changes.  She has not had any syncope.  I do not think that this is cardiac or pulmonary related.  Will get a CT of her head  to further evaluate for any intracranial process.  I have low suspicion for stroke but will reevaluate and consider maybe transfer for MRI.  But clinically I do suspect likely peripheral vertigo.  She is well-appearing.  She does have history of hypertension and stroke memory issues.  She does ambulate with a walker at baseline.  Sounds like she has been doing okay with this for the most part after talking with family.  Patient handed off to oncoming ED staff with patient pending images and lab work and reevaluation.  Prior to handoff I did reevaluate the patient and she is feeling much better.  She is able to ambulate around the room with very minimal assistance and felt like she was not having any of her symptoms anymore.  Anticipate that she can be discharged with meclizine  and Zofran  and ENT follow-up if images and lab work are unremarkable.  This chart was dictated using voice recognition software.  Despite best efforts to proofread,  errors can occur which can change the documentation meaning.      Final diagnoses:  Dizziness    ED Discharge Orders     None          Ruthe Cornet, DO 10/13/24 2238    Ruthe Cornet, DO 10/13/24 2239    Ruthe Cornet, DO 10/13/24 2313  "

## 2024-10-13 NOTE — ED Triage Notes (Signed)
 Pt states that she has been having vertigo since Monday and this is the worse it has ever been, dizziness , nausea and pain in the L ear

## 2024-10-14 NOTE — ED Provider Notes (Signed)
 Case signed out to me by Dr. Ruthe to follow-up CT scan.  Patient with recurrent dizziness, history of vertigo.  Patient CT scan has been performed and there is no acute process.  Upon recheck, patient reports that the meclizine  significantly improved her symptoms.  Will discharge with symptomatic treatment for vertigo, follow-up with ENT.   Haze Lonni PARAS, MD 10/14/24 FONTAINE

## 2024-10-15 ENCOUNTER — Emergency Department (HOSPITAL_COMMUNITY)
Admission: EM | Admit: 2024-10-15 | Discharge: 2024-10-15 | Disposition: A | Discharge status: Home / Self Care | Attending: Emergency Medicine | Admitting: Emergency Medicine

## 2024-10-15 ENCOUNTER — Other Ambulatory Visit: Payer: Self-pay

## 2024-10-15 ENCOUNTER — Emergency Department (HOSPITAL_COMMUNITY)

## 2024-10-15 DIAGNOSIS — Z79899 Other long term (current) drug therapy: Secondary | ICD-10-CM | POA: Diagnosis not present

## 2024-10-15 DIAGNOSIS — F0284 Dementia in other diseases classified elsewhere, unspecified severity, with anxiety: Secondary | ICD-10-CM | POA: Insufficient documentation

## 2024-10-15 DIAGNOSIS — H81399 Other peripheral vertigo, unspecified ear: Secondary | ICD-10-CM | POA: Diagnosis not present

## 2024-10-15 DIAGNOSIS — J45909 Unspecified asthma, uncomplicated: Secondary | ICD-10-CM | POA: Diagnosis not present

## 2024-10-15 DIAGNOSIS — R42 Dizziness and giddiness: Secondary | ICD-10-CM | POA: Diagnosis present

## 2024-10-15 DIAGNOSIS — I10 Essential (primary) hypertension: Secondary | ICD-10-CM | POA: Insufficient documentation

## 2024-10-15 DIAGNOSIS — Z7982 Long term (current) use of aspirin: Secondary | ICD-10-CM | POA: Insufficient documentation

## 2024-10-15 DIAGNOSIS — G309 Alzheimer's disease, unspecified: Secondary | ICD-10-CM | POA: Diagnosis not present

## 2024-10-15 LAB — URINALYSIS, ROUTINE W REFLEX MICROSCOPIC
Bilirubin Urine: NEGATIVE
Glucose, UA: NEGATIVE mg/dL
Hgb urine dipstick: NEGATIVE
Ketones, ur: NEGATIVE mg/dL
Leukocytes,Ua: NEGATIVE
Nitrite: NEGATIVE
Protein, ur: NEGATIVE mg/dL
Specific Gravity, Urine: 1 — ABNORMAL LOW (ref 1.005–1.030)
pH: 6 (ref 5.0–8.0)

## 2024-10-15 LAB — COMPREHENSIVE METABOLIC PANEL WITH GFR
ALT: 19 U/L (ref 0–44)
AST: 28 U/L (ref 15–41)
Albumin: 4.4 g/dL (ref 3.5–5.0)
Alkaline Phosphatase: 115 U/L (ref 38–126)
Anion gap: 14 (ref 5–15)
BUN: 17 mg/dL (ref 8–23)
CO2: 24 mmol/L (ref 22–32)
Calcium: 9.3 mg/dL (ref 8.9–10.3)
Chloride: 99 mmol/L (ref 98–111)
Creatinine, Ser: 1 mg/dL (ref 0.44–1.00)
GFR, Estimated: 58 mL/min — ABNORMAL LOW
Glucose, Bld: 84 mg/dL (ref 70–99)
Potassium: 4 mmol/L (ref 3.5–5.1)
Sodium: 136 mmol/L (ref 135–145)
Total Bilirubin: 0.4 mg/dL (ref 0.0–1.2)
Total Protein: 8 g/dL (ref 6.5–8.1)

## 2024-10-15 LAB — CBC WITH DIFFERENTIAL/PLATELET
Abs Immature Granulocytes: 0.01 K/uL (ref 0.00–0.07)
Basophils Absolute: 0 K/uL (ref 0.0–0.1)
Basophils Relative: 1 %
Eosinophils Absolute: 0.1 K/uL (ref 0.0–0.5)
Eosinophils Relative: 2 %
HCT: 38.5 % (ref 36.0–46.0)
Hemoglobin: 13 g/dL (ref 12.0–15.0)
Immature Granulocytes: 0 %
Lymphocytes Relative: 29 %
Lymphs Abs: 1.7 K/uL (ref 0.7–4.0)
MCH: 33.9 pg (ref 26.0–34.0)
MCHC: 33.8 g/dL (ref 30.0–36.0)
MCV: 100.3 fL — ABNORMAL HIGH (ref 80.0–100.0)
Monocytes Absolute: 0.8 K/uL (ref 0.1–1.0)
Monocytes Relative: 13 %
Neutro Abs: 3.1 K/uL (ref 1.7–7.7)
Neutrophils Relative %: 55 %
Platelets: 202 K/uL (ref 150–400)
RBC: 3.84 MIL/uL — ABNORMAL LOW (ref 3.87–5.11)
RDW: 12.2 % (ref 11.5–15.5)
WBC: 5.7 K/uL (ref 4.0–10.5)
nRBC: 0 % (ref 0.0–0.2)

## 2024-10-15 MED ORDER — LORAZEPAM 2 MG/ML IJ SOLN: 1.0000 mg | Freq: Once | INTRAMUSCULAR | Status: DC | PRN

## 2024-10-15 MED ORDER — LORAZEPAM 1 MG PO TABS
1.0000 mg | ORAL_TABLET | Freq: Once | ORAL | Status: AC
  Administered 2024-10-15: 1 mg via ORAL
  Filled 2024-10-15: qty 1

## 2024-10-15 NOTE — Discharge Instructions (Addendum)
 It was a pleasure caring for you today in the emergency department.  Be sure to move very carefully over the next few days.  Please sit down or you feel dizzy to avoid falling down.  Please follow-up with your PCP and with ear nose and throat specialist (Dr Tobie) that you were referred to at your recent visit  You are prescribed a medicine for your dizziness and also for your nausea when you were seen in the ER yesterday.  You can take these as needed per instructions on packaging  Please return to the emergency department for any worsening or worrisome symptoms.

## 2024-10-15 NOTE — ED Provider Triage Note (Signed)
 Emergency Medicine Provider Triage Evaluation Note  Quincey Nored , a 79 y.o. female  was evaluated in triage.  Pt complains of vertigo reporting room spinning worse with head movement or fast movements x 9 days. Seen in ED for same complaint and told to come here for MRI if persistent.   Reports chronic L sided weakness from stroke in 2006. Taking plavix  with no missed doses.   Denies blurry vision, chest pain, shortness of breath, abdominal pain, n/v/d  Review of Systems  Positive: N/a Negative: N/a  Physical Exam  BP (!) 133/56 (BP Location: Left Arm)   Pulse 87   Temp 98 F (36.7 C)   Resp 18   SpO2 97%  Gen:   Awake, no distress   Resp:  Normal effort  MSK:   Moves extremities without difficulty  Other:    Medical Decision Making  Medically screening exam initiated at 3:57 PM.  Appropriate orders placed.  Jamaiya Tunnell was informed that the remainder of the evaluation will be completed by another provider, this initial triage assessment does not replace that evaluation, and the importance of remaining in the ED until their evaluation is complete.     Beola Terrall RAMAN, [place] 10/15/24 [phone]

## 2024-10-15 NOTE — ED Triage Notes (Signed)
 Pt c/o vertigo. Has been dizzy and off balance for a week. Doctor advised pt to come to ER for MRI

## 2024-10-15 NOTE — ED Provider Notes (Signed)
 " [place] EMERGENCY DEPARTMENT AT [facility] Provider Note  CSN: [id number] Arrival date & time: 10/15/24 1356  Chief Complaint(s) Dizziness  HPI Amy Ray is a 79 y.o. female with past medical history as below, significant for Alzheimer disease, vertigo, CVA, cognitive impairment, hypertension who presents to the ED with complaint of dizziness  She was seen in the ED yesterday at [facility] for dizziness, been ongoing over the past week.  She was given Antivert , had laboratory evaluation head CT.  Symptoms improved with Antivert  and she was discharged home in stable condition.  She reports to the ER today after reports her PCP advised her to come to the ER for MRI.  She is currently asymptomatic.  She has no dizziness.  No headache or vision changes.  No numbness or weakness to her extremities.  No significant change since she was seen in the ER recently.  Past Medical History Past Medical History:  Diagnosis Date   Alzheimers disease (HCC)    Anticoagulant long-term use    plavix --- manged by pcp   Asthma    Chronic constipation    Diverticulosis of colon    Generalized abdominal pain    Glaucoma, both eyes    Hemorrhoids    History of CVA with residual deficit 03/16/2005   left facial paresthesia;  per pt note from stroke but in neurologist note is cva residual   History of diverticulitis of colon 05/20/2015   History of lower GI bleeding    Hyperlipidemia, mixed    Hypertension    per previous had nuclear stress test w/ cardiologist w/ dr florencio , results in care everywhere , NUC 02-01-2017  normal , ef 60%;  echo 02-02-2017 ef 50% mild AR/TR; and normal stress echo   MCI (mild cognitive impairment)    neurology--- camie sevin PA ([facility]);  per note seconardy to vascular and alzeimer disease;  per MRI moderate to extensive CSV ischemia   Nocturia    OA (osteoarthritis)    PMB (postmenopausal bleeding)    Thickened endometrium    Trigeminal neuralgia  of left side of face    per pt started after stroke   Vertigo    Wears partial dentures    upper   Patient Active Problem List   Diagnosis Date Noted   Moderate persistent asthma without complication 07/25/2024   Mild intermittent asthma without complication 01/06/2024   Chronic pain of both knees 01/21/2023   Bilateral lower extremity edema 01/21/2023   History of reverse total replacement of right shoulder joint 01/11/2023   Osteoarthritis of right shoulder 01/11/2023   Glaucoma 01/11/2023   Gastroesophageal reflux disease 01/11/2023   History of shoulder surgery 01/11/2023   Facial neuralgia 01/11/2023   Pain in right shoulder 07/16/2022   Mild cognitive impairment 02/08/2022   Mild late onset Alzheimer's dementia (HCC) 11/11/2021   Vitamin D deficiency 09/21/2020   Hypokalemia 08/05/2020   History of total bilateral knee replacement 06/20/2018   Presence of unspecified artificial shoulder joint 06/20/2018   Low back pain 05/20/2015   Fatigue 05/20/2015   History of CVA (cerebrovascular accident) 05/20/2015   Insomnia 05/20/2015   Morbid obesity (HCC) 05/20/2015   Positive H. pylori test 05/20/2015   Tremor 05/20/2015   Dysfunction of eustachian tube 05/20/2015   Diverticulosis 05/20/2015   Essential (primary) hypertension 07/17/2007   Alterations of sensations, late effect of cerebrovascular disease 03/11/2005   Hyperlipidemia 10/12/2003   Trigeminal neuralgia 10/12/2003   Home Medication(s) Prior to  Admission medications  Medication Sig Start Date End Date Taking? Authorizing Provider  Acetaminophen  (TYLENOL  EX ST ARTHRITIS PAIN PO) Take 2 tablets by mouth daily as needed (for arthritis pain).    [provider]  albuterol  (VENTOLIN  HFA) 108 (90 Base) MCG/ACT inhaler Inhale 1-2 puffs into the lungs every 6 (six) hours as needed for wheezing or shortness of breath. 07/24/24   Tamea Dedra CROME, MD  aspirin  81 MG tablet Take 1 tablet by mouth daily.     [provider]  brimonidine (ALPHAGAN) 0.2 % ophthalmic solution SMARTSIG:In Eye(s) 03/26/24   [provider]  carbamazepine  (TEGRETOL ) 200 MG tablet TAKE 1 TABLET TWICE DAILY 09/24/24   Allwardt, Alyssa M, PA-C  clopidogrel  (PLAVIX ) 75 MG tablet TAKE 1 TABLET EVERY DAY 01/13/24   Allwardt, Alyssa M, PA-C  DILT-XR 180 MG 24 hr capsule TAKE 2 CAPSULES EVERY DAY 09/24/24   Allwardt, Alyssa M, PA-C  dorzolamide (TRUSOPT) 2 % ophthalmic solution SMARTSIG:In Eye(s) 03/26/24   [provider]  famotidine  (PEPCID ) 40 MG tablet TAKE 1 TABLET AT BEDTIME 01/13/24   Allwardt, Alyssa M, PA-C  furosemide  (LASIX ) 20 MG tablet TAKE 1 TABLET EVERY DAY 11/10/23   Allwardt, Alyssa M, PA-C  gabapentin  (NEURONTIN ) 100 MG capsule Take 1 capsule (100 mg total) by mouth 2 (two) times daily as needed. 08/05/23   Magnant, Charles L, PA-C  hydrALAZINE  (APRESOLINE ) 10 MG tablet Take 1 tablet (10 mg total) by mouth daily as needed. If bp >170 and feeling poorly 06/18/24   Wendolyn Jenkins Jansky, MD  meclizine  (ANTIVERT ) 25 MG tablet Take 1 tablet (25 mg total) by mouth 3 (three) times daily as needed for dizziness. 10/13/24   Curatolo, Adam, DO  nortriptyline  (PAMELOR ) 10 MG capsule TAKE 1 TO 2 CAPSULES BY MOUTH AT BEDTIME 01/15/24   Allwardt, Alyssa M, PA-C  ondansetron  (ZOFRAN -ODT) 4 MG disintegrating tablet Take 1 tablet (4 mg total) by mouth every 8 (eight) hours as needed. 10/13/24   Curatolo, Adam, DO  simvastatin  (ZOCOR ) 10 MG tablet TAKE 1 TABLET AT BEDTIME 11/10/23   Allwardt, Alyssa M, PA-C  SYMBICORT  160-4.5 MCG/ACT inhaler Inhale 2 puffs into the lungs 2 (two) times daily. 07/24/24   Tamea Dedra CROME, MD  VITAMIN D PO Take 1 capsule by mouth daily.    [provider]                                                                                                                                    Past Surgical History Past Surgical History:  Procedure Laterality Date   CATARACT EXTRACTION W/  INTRAOCULAR LENS IMPLANT Bilateral    right 2023;  left , yrs ago   CHOLECYSTECTOMY     CHOLECYSTECTOMY, LAPAROSCOPIC  2000   HYSTEROSCOPY WITH D & C N/A 12/20/2022   Procedure: DILATATION AND CURETTAGE;  Surgeon: Lequita Evalene LABOR, MD;  Location: [facility];  Service: Gynecology;  Laterality: N/A;   REVERSE SHOULDER ARTHROPLASTY Bilateral    right 06-25-2022 in [place];   approx.  2017  left side in [place]   TOTAL KNEE ARTHROPLASTY Bilateral    2004;  2005   UMBILICAL HERNIA REPAIR     child   Family History Family History  Problem Relation Age of Onset   Stroke Mother    Obesity Mother    Hypertension Mother    Alzheimer's disease Father    Diabetes Father    Hypertension Sister    Hypertension Sister    Hypertension Sister    Hypertension Sister    Hypertension Sister    Breast cancer Sister 79   Diabetes Brother    Breast cancer Maternal Aunt 41   Breast cancer Maternal Aunt     Social History Social History[1] Allergies Augmentin  [amoxicillin -pot clavulanate], Quinapril-hydrochlorothiazide , Tramadol, and Silicone  Review of Systems A thorough review of systems was obtained and all systems are negative except as noted in the HPI and PMH.   Physical Exam Vital Signs  I have reviewed the triage vital signs BP 130/60   Pulse 82   Temp 98.2 F (36.8 C) (Oral)   Resp 18   SpO2 100%  Physical Exam Vitals and nursing note reviewed.  Constitutional:      General: She is not in acute distress.    Appearance: Normal appearance. She is well-developed. She is not ill-appearing.  HENT:     Head: Normocephalic and atraumatic.     Right Ear: External ear normal.     Left Ear: External ear normal.     Nose: Nose normal.     Mouth/Throat:     Mouth: Mucous membranes are moist.  Eyes:     General: No scleral icterus.       Right eye: No discharge.        Left eye: No discharge.     Extraocular Movements: Extraocular movements intact.     Pupils:  Pupils are equal, round, and reactive to light.  Cardiovascular:     Rate and Rhythm: Normal rate.  Pulmonary:     Effort: Pulmonary effort is normal. No respiratory distress.     Breath sounds: No stridor.  Abdominal:     General: Abdomen is flat. There is no distension.     Tenderness: There is no guarding.  Musculoskeletal:        General: No deformity.     Cervical back: No rigidity.  Skin:    General: Skin is warm and dry.     Coloration: Skin is not cyanotic, jaundiced or pale.  Neurological:     Mental Status: She is alert and oriented to person, place, and time.     GCS: GCS eye subscore is 4. GCS verbal subscore is 5. GCS motor subscore is 6.     Cranial Nerves: Cranial nerves 2-12 are intact.     Sensory: Sensation is intact.     Motor: Motor function is intact.     Coordination: Coordination is intact.     Gait: Gait is intact.  Psychiatric:        Speech: Speech normal.        Behavior: Behavior normal. Behavior is cooperative.     ED Results and Treatments Labs (all labs ordered are listed, but only abnormal results are displayed) Labs Reviewed  CBC WITH DIFFERENTIAL/PLATELET - Abnormal; Notable for the following components:      Result Value   RBC  3.84 (*)    MCV 100.3 (*)    All other components within normal limits  COMPREHENSIVE METABOLIC PANEL WITH GFR - Abnormal; Notable for the following components:   GFR, Estimated 58 (*)    All other components within normal limits  URINALYSIS, ROUTINE W REFLEX MICROSCOPIC - Abnormal; Notable for the following components:   Color, Urine COLORLESS (*)    Specific Gravity, Urine 1.000 (*)    All other components within normal limits                                                                                                                          Radiology MR BRAIN WO CONTRAST Result Date: 10/15/2024 EXAM: MRI Brain Without Contrast 10/15/2024 05:51:50 PM TECHNIQUE: Multiplanar multisequence MRI of the  head/brain was performed without the administration of intravenous contrast. COMPARISON: CT head 10/14/2024 CLINICAL HISTORY: Persistent vertigo FINDINGS: BRAIN AND VENTRICLES: No acute infarct. No intracranial hemorrhage. No mass. No midline shift. No hydrocephalus. The sella is unremarkable. Normal flow voids. Moderate T2 hyperintensities in the white matter, compatible with chronic microvascular ischemic change. ORBITS: No acute abnormality. SINUSES AND MASTOIDS: No acute abnormality. BONES AND SOFT TISSUES: Normal marrow signal. No acute soft tissue abnormality. IMPRESSION: 1. No acute intracranial abnormality. Electronically signed by: Gilmore Molt 10/15/2024 06:23 PM EST RP Workstation: [id number]    Pertinent labs & imaging results that were available during my care of the patient were reviewed by me and considered in my medical decision making (see MDM for details).  Medications Ordered in ED Medications  LORazepam  (ATIVAN ) tablet 1 mg (1 mg Oral Given 10/15/24 1634)                                                                                                                                     Procedures Procedures  (including critical care time)  Medical Decision Making / ED Course    Medical Decision Making:    Chinwe Lope is a 79 y.o. female  with past medical history as below, significant for Alzheimer disease, vertigo, CVA, cognitive impairment, hypertension who presents to the ED with complaint of dizziness. The complaint involves an extensive differential diagnosis and also carries with it a high risk of complications and morbidity.  Serious etiology was considered. Ddx includes but is not limited to: Peripheral vertigo, central vertigo, Mnire's disease, disequilibrium, schwannoma, etc.  Complete initial physical  exam performed, notably the patient was in no acute distress, resting comfortably.    Reviewed and confirmed nursing documentation for past medical history,  family history, social history.  Vital signs reviewed.    Peripheral vertigo> - With vertigo most consistent with peripheral etiology, positional, rapid in onset.  Spinning sensation.  Improved with rest and with Antivert . - Viewed imaging from recent ER visit which was stable, labs today are stable.  MRI ordered in triage is stable.  Is currently asymptomatic - Will continue plan from recent ER visit, recommend ENT follow-up, she was prescribed Antivert  and Zofran  at ER visit yesterday - She is asymptomatic, neuro intact, ambulatory.  Clinical Course as of 10/15/24 2110  Mon Oct 15, 2024  1954 MRI stable Labs stable [SG]  2027 She is asymptomatic  [SG]    Clinical Course User Index [SG] Elnor Jayson LABOR, DO     9:10 PM:  I have discussed the diagnosis/risks/treatment options with the patient and family.  Evaluation and diagnostic testing in the emergency department does not suggest an emergent condition requiring admission or immediate intervention beyond what has been performed at this time.  They will follow up with pcp. We also discussed returning to the ED immediately if new or worsening sx occur. We discussed the sx which are most concerning (e.g., sudden worsening pain, fever, inability to tolerate by mouth ) that necessitate immediate return.    The patient appears reasonably screened and/or stabilized for discharge and I doubt any other medical condition or other [facility] requiring further screening, evaluation, or treatment in the ED at this time prior to discharge.                 Additional history obtained: -Additional history obtained from family -External records from outside source obtained and reviewed including: Chart review including previous notes, labs, imaging, consultation notes including  ER visit yesterday, prior labs and imaging, home medications   Lab Tests: -I ordered, reviewed, and interpreted labs.   The pertinent results include:   Labs Reviewed   CBC WITH DIFFERENTIAL/PLATELET - Abnormal; Notable for the following components:      Result Value   RBC 3.84 (*)    MCV 100.3 (*)    All other components within normal limits  COMPREHENSIVE METABOLIC PANEL WITH GFR - Abnormal; Notable for the following components:   GFR, Estimated 58 (*)    All other components within normal limits  URINALYSIS, ROUTINE W REFLEX MICROSCOPIC - Abnormal; Notable for the following components:   Color, Urine COLORLESS (*)    Specific Gravity, Urine 1.000 (*)    All other components within normal limits    Notable for labs are stable  EKG   EKG Interpretation Date/Time:    Ventricular Rate:    PR Interval:    QRS Duration:    QT Interval:    QTC Calculation:   R Axis:      Text Interpretation:           Imaging Studies ordered: MRI brain was ordered in triage I independently visualized the following imaging with scope of interpretation limited to determining acute life threatening conditions related to emergency care; findings noted above I agree with the radiologist interpretation If any imaging was obtained with contrast I closely monitored patient for any possible adverse reaction a/w contrast administration in the emergency department   Medicines ordered and prescription drug management: Meds ordered this encounter  Medications   DISCONTD: LORazepam  (ATIVAN ) injection 1 mg  LORazepam  (ATIVAN ) tablet 1 mg    -I have reviewed the patients home medicines and have made adjustments as needed   Consultations Obtained: Not applicable  Cardiac Monitoring: Continuous pulse oximetry interpreted by myself, 100% on RA.    Social Determinants of Health:  Diagnosis or treatment significantly limited by social determinants of health: na   Reevaluation: After the interventions noted above, I reevaluated the patient and found that they have resolved  Co morbidities that complicate the patient evaluation  Past Medical History:   Diagnosis Date   Alzheimers disease (HCC)    Anticoagulant long-term use    plavix --- manged by pcp   Asthma    Chronic constipation    Diverticulosis of colon    Generalized abdominal pain    Glaucoma, both eyes    Hemorrhoids    History of CVA with residual deficit 03/16/2005   left facial paresthesia;  per pt note from stroke but in neurologist note is cva residual   History of diverticulitis of colon 05/20/2015   History of lower GI bleeding    Hyperlipidemia, mixed    Hypertension    per previous had nuclear stress test w/ cardiologist w/ dr florencio , results in care everywhere , NUC 02-01-2017  normal , ef 60%;  echo 02-02-2017 ef 50% mild AR/TR; and normal stress echo   MCI (mild cognitive impairment)    neurology--- camie sevin PA ([place]);  per note seconardy to vascular and alzeimer disease;  per MRI moderate to extensive CSV ischemia   Nocturia    OA (osteoarthritis)    PMB (postmenopausal bleeding)    Thickened endometrium    Trigeminal neuralgia of left side of face    per pt started after stroke   Vertigo    Wears partial dentures    upper      Dispostion: Disposition decision including need for hospitalization was considered, and patient discharged from emergency department.    Final Clinical Impression(s) / ED Diagnoses Final diagnoses:  Peripheral vertigo, unspecified laterality         [1]  Social History Tobacco Use   Smoking status: Never   Smokeless tobacco: Never  Vaping Use   Vaping status: Never Used  Substance Use Topics   Alcohol use: No    Alcohol/week: 0.0 standard drinks of alcohol   Drug use: Never     Elnor Jayson LABOR, DO 10/15/24 2110  "

## 2024-10-22 ENCOUNTER — Ambulatory Visit: Admitting: Family Medicine

## 2024-10-22 ENCOUNTER — Encounter: Payer: Self-pay | Admitting: Family Medicine

## 2024-10-22 VITALS — BP 156/86 | HR 88 | Temp 98.3°F | Ht 60.0 in | Wt 210.8 lb

## 2024-10-22 DIAGNOSIS — R42 Dizziness and giddiness: Secondary | ICD-10-CM

## 2024-10-22 DIAGNOSIS — I10 Essential (primary) hypertension: Secondary | ICD-10-CM | POA: Diagnosis not present

## 2024-10-22 DIAGNOSIS — R2689 Other abnormalities of gait and mobility: Secondary | ICD-10-CM

## 2024-10-22 NOTE — Progress Notes (Signed)
 "  Established Patient Office Visit   Subjective  Patient ID: Amy Ray, female    DOB: 10/12/1945  Age: 79 y.o. MRN: [id number]  Chief Complaint  Patient presents with   Medical Management of Chronic Issues    Patient came in today for ED Follow-up for Vertigo, seen 1/3 and 1/5, Zofran  and meclizine  given stopped Ativan , patient is not taking meclizine     Is a 79 year old female seen for ED follow-up.  Patient seen on 1/3 and 1/5 for dizziness thought associated with vertigo after CT and subsequent MRI head were negative.  Dizziness started after patient was bending down in her closet.  Symptoms similar to prior episodes of vertigo but worse.  Patient given Rx for Zofran  and meclizine .  Patient tried taking meclizine  once but has been afraid to continue taking as information from pharmacy said it was not safe in people over certain age.  Patient's daughter concerned about patient's balance.  States started having issues after CVA years ago.  Pt declines PT states knows what exercises to do at home.  Also doing chair exercises intermittently.  Inquires about getting Handicap placard renewed.   Pt mentions an episode of waking up feeling like her heart beat was shaking the bed and so loud she could hear it in her ears.  Pt checking bp at home, typically 120s.  Recent reading 136/74.  Pt's daughter [name] cooking for her.      Patient Active Problem List   Diagnosis Date Noted   Moderate persistent asthma without complication 07/25/2024   Mild intermittent asthma without complication 01/06/2024   Chronic pain of both knees 01/21/2023   Bilateral lower extremity edema 01/21/2023   History of reverse total replacement of right shoulder joint 01/11/2023   Osteoarthritis of right shoulder 01/11/2023   Glaucoma 01/11/2023   Gastroesophageal reflux disease 01/11/2023   History of shoulder surgery 01/11/2023   Facial neuralgia 01/11/2023   Pain in right shoulder 07/16/2022   Mild cognitive  impairment 02/08/2022   Mild late onset Alzheimer's dementia (HCC) 11/11/2021   Vitamin D deficiency 09/21/2020   Hypokalemia 08/05/2020   History of total bilateral knee replacement 06/20/2018   Presence of unspecified artificial shoulder joint 06/20/2018   Low back pain 05/20/2015   Fatigue 05/20/2015   History of CVA (cerebrovascular accident) 05/20/2015   Insomnia 05/20/2015   Morbid obesity (HCC) 05/20/2015   Positive H. pylori test 05/20/2015   Tremor 05/20/2015   Dysfunction of eustachian tube 05/20/2015   Diverticulosis 05/20/2015   Essential (primary) hypertension 07/17/2007   Alterations of sensations, late effect of cerebrovascular disease 03/11/2005   Hyperlipidemia 10/12/2003   Trigeminal neuralgia 10/12/2003   Past Medical History:  Diagnosis Date   Alzheimers disease (HCC)    Anticoagulant long-term use    plavix --- manged by pcp   Asthma    Chronic constipation    Diverticulosis of colon    Generalized abdominal pain    Glaucoma, both eyes    Hemorrhoids    History of CVA with residual deficit 03/16/2005   left facial paresthesia;  per pt note from stroke but in neurologist note is cva residual   History of diverticulitis of colon 05/20/2015   History of lower GI bleeding    Hyperlipidemia, mixed    Hypertension    per previous had nuclear stress test w/ cardiologist w/ dr florencio , results in care everywhere , NUC 02-01-2017  normal , ef 60%;  echo 02-02-2017 ef 50% mild AR/TR; and normal  stress echo   MCI (mild cognitive impairment)    neurology--- camie sevin PA ([facility]);  per note seconardy to vascular and alzeimer disease;  per MRI moderate to extensive CSV ischemia   Nocturia    OA (osteoarthritis)    PMB (postmenopausal bleeding)    Thickened endometrium    Trigeminal neuralgia of left side of face    per pt started after stroke   Vertigo    Wears partial dentures    upper   Past Surgical History:  Procedure Laterality Date    CATARACT EXTRACTION W/ INTRAOCULAR LENS IMPLANT Bilateral    right 2023;  left , yrs ago   CHOLECYSTECTOMY     CHOLECYSTECTOMY, LAPAROSCOPIC  2000   HYSTEROSCOPY WITH D & C N/A 12/20/2022   Procedure: DILATATION AND CURETTAGE;  Surgeon: Lequita Evalene LABOR, MD;  Location: [facility];  Service: Gynecology;  Laterality: N/A;   REVERSE SHOULDER ARTHROPLASTY Bilateral    right 06-25-2022 in [place];   approx.  2017  left side in [place]   TOTAL KNEE ARTHROPLASTY Bilateral    2004;  2005   UMBILICAL HERNIA REPAIR     child   Social History[1] Family History  Problem Relation Age of Onset   Stroke Mother    Obesity Mother    Hypertension Mother    Alzheimer's disease Father    Diabetes Father    Hypertension Sister    Hypertension Sister    Hypertension Sister    Hypertension Sister    Hypertension Sister    Breast cancer Sister 87   Diabetes Brother    Breast cancer Maternal Aunt 12   Breast cancer Maternal Aunt    Allergies[2]  ROS Negative unless stated above    Objective:     BP (!) 156/86 (BP Location: Left Arm, Patient Position: Sitting, Cuff Size: Large)   Pulse 88   Temp 98.3 F (36.8 C) (Oral)   Ht 5' (1.524 m)   Wt 210 lb 12.8 oz (95.6 kg)   SpO2 97%   BMI 41.17 kg/m  BP Readings from Last 3 Encounters:  10/22/24 (!) 156/88  10/15/24 130/60  10/13/24 (!) 155/82   Wt Readings from Last 3 Encounters:  10/22/24 210 lb 12.8 oz (95.6 kg)  09/14/24 216 lb 9.6 oz (98.2 kg)  08/01/24 205 lb (93 kg)      Physical Exam Constitutional:      General: She is not in acute distress.    Appearance: Normal appearance.  HENT:     Head: Normocephalic and atraumatic.     Nose: Nose normal.     Mouth/Throat:     Mouth: Mucous membranes are moist.  Eyes:     Extraocular Movements:     Right eye: Nystagmus present.     Comments: 1 beat nystagmus in R eye with lateral gaze returning to midline.  Cardiovascular:     Rate and Rhythm: Normal rate and  regular rhythm.     Heart sounds: Normal heart sounds. No murmur heard.    No gallop.  Pulmonary:     Effort: Pulmonary effort is normal. No respiratory distress.     Breath sounds: Normal breath sounds. No wheezing, rhonchi or rales.  Skin:    General: Skin is warm and dry.  Neurological:     Mental Status: She is alert and oriented to person, place, and time.        10/22/2024    3:37 PM 09/14/2024  3:47 PM 11/09/2023    2:47 PM  Depression screen PHQ 2/9  Decreased Interest 0 0 0  Down, Depressed, Hopeless 0 0 0  PHQ - 2 Score 0 0 0  Altered sleeping 2 0 0  Tired, decreased energy 0 0 0  Change in appetite 0 0 0  Feeling bad or failure about yourself  0 0 0  Trouble concentrating 0 2 0  Moving slowly or fidgety/restless 0 0 0  Suicidal thoughts 0 0 0  PHQ-9 Score 2 2 0   Difficult doing work/chores   Not difficult at all     Data saved with a previous flowsheet row definition      10/22/2024    3:38 PM 09/14/2024    3:47 PM 10/14/2023   11:15 AM 05/11/2023   10:18 AM  GAD 7 : Generalized Anxiety Score  Nervous, Anxious, on Edge 0 2 0 0  Control/stop worrying 0 0 0 0  Worry too much - different things 0  0 0  Trouble relaxing 0 0 0 0  Restless 0 0 0 0  Easily annoyed or irritable 0 0 0 0  Afraid - awful might happen 0 0 0 0  Total GAD 7 Score 0  0 0  Anxiety Difficulty Not difficult at all  Not difficult at all Not difficult at all     No results found for any visits on 10/22/24.    Assessment & Plan:   Vertigo  Essential hypertension  Balance problem  Intermittent vertigo likely caused by bending over.  Discussed treatment options.  Pt advised ok to use meclizine  prn.   Can also try OTC ear gtts. Hydration encouraged as well as taking time when changing position.  Offered referral to vestibular rehab and PT in general for balance, however pt declines at this time.  Continue to monitor.   BP elevated in clinic but controlled at home.  Recheck.  Continue  lifestyle modifications and monitoring at home.  Continue diltiazem  360 mg.  Also on lasix  20 mg daily.  Avoid hypotension.    Return in about 3 months (around 01/20/2025), or if symptoms worsen or fail to improve, for blood pressure, dizziness, balance.   Clotilda JONELLE Single, MD      [1]  Social History Tobacco Use   Smoking status: Never   Smokeless tobacco: Never  Vaping Use   Vaping status: Never Used  Substance Use Topics   Alcohol use: No    Alcohol/week: 0.0 standard drinks of alcohol   Drug use: Never  [2]  Allergies Allergen Reactions   Augmentin  [Amoxicillin -Pot Clavulanate] Hives   Quinapril-Hydrochlorothiazide  Other (See Comments) and Cough    Other reaction(s): Headache     Tramadol     Dizzy, headache, nosebleed, near-syncope   Silicone Rash   "

## 2024-11-05 ENCOUNTER — Ambulatory Visit: Admitting: Orthopedic Surgery

## 2024-11-12 ENCOUNTER — Ambulatory Visit: Payer: Medicare HMO

## 2024-11-14 ENCOUNTER — Ambulatory Visit: Admitting: Orthopedic Surgery

## 2025-01-14 ENCOUNTER — Ambulatory Visit: Admitting: Family Medicine

## 2025-01-22 ENCOUNTER — Ambulatory Visit: Admitting: Pulmonary Disease
